# Patient Record
Sex: Female | Born: 1935 | Race: White | Hispanic: No | Marital: Single | Smoking: Never smoker
Health system: Southern US, Community
[De-identification: ages and names within clinical notes are randomized; demographics above are authoritative.]

## PROBLEM LIST (undated history)

## (undated) DIAGNOSIS — K219 Gastro-esophageal reflux disease without esophagitis: Secondary | ICD-10-CM

## (undated) DIAGNOSIS — I119 Hypertensive heart disease without heart failure: Secondary | ICD-10-CM

## (undated) DIAGNOSIS — E785 Hyperlipidemia, unspecified: Secondary | ICD-10-CM

## (undated) DIAGNOSIS — M1990 Unspecified osteoarthritis, unspecified site: Secondary | ICD-10-CM

## (undated) DIAGNOSIS — I255 Ischemic cardiomyopathy: Secondary | ICD-10-CM

## (undated) DIAGNOSIS — I10 Essential (primary) hypertension: Secondary | ICD-10-CM

## (undated) DIAGNOSIS — I5042 Chronic combined systolic (congestive) and diastolic (congestive) heart failure: Secondary | ICD-10-CM

## (undated) DIAGNOSIS — I2510 Atherosclerotic heart disease of native coronary artery without angina pectoris: Secondary | ICD-10-CM

## (undated) DIAGNOSIS — J449 Chronic obstructive pulmonary disease, unspecified: Secondary | ICD-10-CM

## (undated) DIAGNOSIS — I509 Heart failure, unspecified: Secondary | ICD-10-CM

## (undated) DIAGNOSIS — B019 Varicella without complication: Secondary | ICD-10-CM

## (undated) HISTORY — DX: Gastro-esophageal reflux disease without esophagitis: K21.9

## (undated) HISTORY — PX: SHOULDER SURGERY: SHX246

## (undated) HISTORY — DX: Hyperlipidemia, unspecified: E78.5

## (undated) HISTORY — DX: Varicella without complication: B01.9

## (undated) HISTORY — DX: Unspecified osteoarthritis, unspecified site: M19.90

---
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
## 1959-08-17 HISTORY — PX: APPENDECTOMY: SHX54

## 1964-08-16 HISTORY — PX: AMPUTATION FINGER: SHX6594

## 1983-08-17 HISTORY — PX: ABDOMINAL HYSTERECTOMY: SHX81

## 2008-08-16 HISTORY — PX: BACK SURGERY: SHX140

## 2010-08-16 HISTORY — PX: TOTAL HIP ARTHROPLASTY: SHX124

## 2015-10-21 DIAGNOSIS — I252 Old myocardial infarction: Secondary | ICD-10-CM | POA: Diagnosis not present

## 2015-10-21 DIAGNOSIS — M25552 Pain in left hip: Secondary | ICD-10-CM | POA: Diagnosis not present

## 2015-10-21 DIAGNOSIS — I110 Hypertensive heart disease with heart failure: Secondary | ICD-10-CM | POA: Diagnosis not present

## 2015-10-21 DIAGNOSIS — I5033 Acute on chronic diastolic (congestive) heart failure: Secondary | ICD-10-CM | POA: Diagnosis not present

## 2015-10-21 DIAGNOSIS — R079 Chest pain, unspecified: Secondary | ICD-10-CM | POA: Diagnosis not present

## 2015-10-21 DIAGNOSIS — R0609 Other forms of dyspnea: Secondary | ICD-10-CM | POA: Diagnosis not present

## 2015-10-21 DIAGNOSIS — R0989 Other specified symptoms and signs involving the circulatory and respiratory systems: Secondary | ICD-10-CM | POA: Diagnosis not present

## 2015-10-21 DIAGNOSIS — I509 Heart failure, unspecified: Secondary | ICD-10-CM | POA: Diagnosis not present

## 2015-10-21 DIAGNOSIS — D72829 Elevated white blood cell count, unspecified: Secondary | ICD-10-CM | POA: Diagnosis not present

## 2015-10-21 DIAGNOSIS — J441 Chronic obstructive pulmonary disease with (acute) exacerbation: Secondary | ICD-10-CM | POA: Diagnosis not present

## 2015-10-21 DIAGNOSIS — J9811 Atelectasis: Secondary | ICD-10-CM | POA: Diagnosis not present

## 2015-10-21 DIAGNOSIS — Y92230 Patient room in hospital as the place of occurrence of the external cause: Secondary | ICD-10-CM | POA: Diagnosis not present

## 2015-10-21 DIAGNOSIS — J90 Pleural effusion, not elsewhere classified: Secondary | ICD-10-CM | POA: Diagnosis not present

## 2015-10-21 DIAGNOSIS — M79606 Pain in leg, unspecified: Secondary | ICD-10-CM | POA: Diagnosis not present

## 2015-10-21 DIAGNOSIS — R748 Abnormal levels of other serum enzymes: Secondary | ICD-10-CM | POA: Diagnosis not present

## 2015-10-21 DIAGNOSIS — R0602 Shortness of breath: Secondary | ICD-10-CM | POA: Diagnosis not present

## 2015-10-21 DIAGNOSIS — J449 Chronic obstructive pulmonary disease, unspecified: Secondary | ICD-10-CM | POA: Diagnosis not present

## 2015-10-21 DIAGNOSIS — M25559 Pain in unspecified hip: Secondary | ICD-10-CM | POA: Diagnosis not present

## 2015-10-21 DIAGNOSIS — N179 Acute kidney failure, unspecified: Secondary | ICD-10-CM | POA: Diagnosis not present

## 2015-10-21 DIAGNOSIS — R197 Diarrhea, unspecified: Secondary | ICD-10-CM | POA: Diagnosis not present

## 2015-10-21 DIAGNOSIS — I081 Rheumatic disorders of both mitral and tricuspid valves: Secondary | ICD-10-CM | POA: Diagnosis not present

## 2015-10-21 DIAGNOSIS — E785 Hyperlipidemia, unspecified: Secondary | ICD-10-CM | POA: Diagnosis not present

## 2015-10-21 DIAGNOSIS — I519 Heart disease, unspecified: Secondary | ICD-10-CM | POA: Diagnosis not present

## 2015-10-21 DIAGNOSIS — I5189 Other ill-defined heart diseases: Secondary | ICD-10-CM | POA: Diagnosis not present

## 2015-10-21 DIAGNOSIS — E860 Dehydration: Secondary | ICD-10-CM | POA: Diagnosis not present

## 2015-10-21 DIAGNOSIS — I517 Cardiomegaly: Secondary | ICD-10-CM | POA: Diagnosis not present

## 2015-10-21 DIAGNOSIS — M25551 Pain in right hip: Secondary | ICD-10-CM | POA: Diagnosis not present

## 2015-10-21 DIAGNOSIS — R7989 Other specified abnormal findings of blood chemistry: Secondary | ICD-10-CM | POA: Diagnosis not present

## 2015-10-21 DIAGNOSIS — I10 Essential (primary) hypertension: Secondary | ICD-10-CM | POA: Diagnosis not present

## 2015-10-27 DIAGNOSIS — N179 Acute kidney failure, unspecified: Secondary | ICD-10-CM | POA: Diagnosis not present

## 2015-10-27 DIAGNOSIS — I5040 Unspecified combined systolic (congestive) and diastolic (congestive) heart failure: Secondary | ICD-10-CM | POA: Diagnosis not present

## 2015-10-27 DIAGNOSIS — I10 Essential (primary) hypertension: Secondary | ICD-10-CM | POA: Diagnosis not present

## 2015-10-27 DIAGNOSIS — E785 Hyperlipidemia, unspecified: Secondary | ICD-10-CM | POA: Diagnosis not present

## 2015-10-27 DIAGNOSIS — D72829 Elevated white blood cell count, unspecified: Secondary | ICD-10-CM | POA: Diagnosis not present

## 2015-10-29 ENCOUNTER — Encounter (HOSPITAL_COMMUNITY): Payer: Self-pay | Admitting: Emergency Medicine

## 2015-10-29 ENCOUNTER — Emergency Department (HOSPITAL_COMMUNITY): Payer: Medicare HMO

## 2015-10-29 ENCOUNTER — Inpatient Hospital Stay (HOSPITAL_COMMUNITY)
Admission: EM | Admit: 2015-10-29 | Discharge: 2015-11-02 | DRG: 280 | Disposition: A | Discharge status: Home Health | Payer: Medicare HMO | Attending: Cardiology | Admitting: Cardiology

## 2015-10-29 DIAGNOSIS — I5022 Chronic systolic (congestive) heart failure: Secondary | ICD-10-CM | POA: Diagnosis not present

## 2015-10-29 DIAGNOSIS — I214 Non-ST elevation (NSTEMI) myocardial infarction: Secondary | ICD-10-CM

## 2015-10-29 DIAGNOSIS — I130 Hypertensive heart and chronic kidney disease with heart failure and stage 1 through stage 4 chronic kidney disease, or unspecified chronic kidney disease: Secondary | ICD-10-CM | POA: Diagnosis present

## 2015-10-29 DIAGNOSIS — E785 Hyperlipidemia, unspecified: Secondary | ICD-10-CM | POA: Diagnosis present

## 2015-10-29 DIAGNOSIS — I2510 Atherosclerotic heart disease of native coronary artery without angina pectoris: Secondary | ICD-10-CM

## 2015-10-29 DIAGNOSIS — R7989 Other specified abnormal findings of blood chemistry: Secondary | ICD-10-CM | POA: Diagnosis not present

## 2015-10-29 DIAGNOSIS — I255 Ischemic cardiomyopathy: Secondary | ICD-10-CM | POA: Diagnosis present

## 2015-10-29 DIAGNOSIS — I5042 Chronic combined systolic (congestive) and diastolic (congestive) heart failure: Secondary | ICD-10-CM | POA: Diagnosis not present

## 2015-10-29 DIAGNOSIS — I10 Essential (primary) hypertension: Secondary | ICD-10-CM | POA: Diagnosis not present

## 2015-10-29 DIAGNOSIS — R112 Nausea with vomiting, unspecified: Secondary | ICD-10-CM | POA: Diagnosis not present

## 2015-10-29 DIAGNOSIS — I272 Other secondary pulmonary hypertension: Secondary | ICD-10-CM | POA: Diagnosis present

## 2015-10-29 DIAGNOSIS — R0789 Other chest pain: Secondary | ICD-10-CM | POA: Diagnosis not present

## 2015-10-29 DIAGNOSIS — I5021 Acute systolic (congestive) heart failure: Secondary | ICD-10-CM | POA: Diagnosis not present

## 2015-10-29 DIAGNOSIS — Z8249 Family history of ischemic heart disease and other diseases of the circulatory system: Secondary | ICD-10-CM

## 2015-10-29 DIAGNOSIS — I252 Old myocardial infarction: Secondary | ICD-10-CM

## 2015-10-29 DIAGNOSIS — I119 Hypertensive heart disease without heart failure: Secondary | ICD-10-CM

## 2015-10-29 DIAGNOSIS — R079 Chest pain, unspecified: Secondary | ICD-10-CM

## 2015-10-29 DIAGNOSIS — R778 Other specified abnormalities of plasma proteins: Secondary | ICD-10-CM

## 2015-10-29 DIAGNOSIS — N183 Chronic kidney disease, stage 3 (moderate): Secondary | ICD-10-CM | POA: Diagnosis present

## 2015-10-29 DIAGNOSIS — J449 Chronic obstructive pulmonary disease, unspecified: Secondary | ICD-10-CM | POA: Diagnosis present

## 2015-10-29 DIAGNOSIS — Z7982 Long term (current) use of aspirin: Secondary | ICD-10-CM

## 2015-10-29 DIAGNOSIS — I2721 Secondary pulmonary arterial hypertension: Secondary | ICD-10-CM

## 2015-10-29 DIAGNOSIS — I5043 Acute on chronic combined systolic (congestive) and diastolic (congestive) heart failure: Secondary | ICD-10-CM | POA: Diagnosis present

## 2015-10-29 HISTORY — DX: Hypertensive heart disease without heart failure: I11.9

## 2015-10-29 HISTORY — DX: Chronic obstructive pulmonary disease, unspecified: J44.9

## 2015-10-29 HISTORY — DX: Essential (primary) hypertension: I10

## 2015-10-29 HISTORY — DX: Ischemic cardiomyopathy: I25.5

## 2015-10-29 HISTORY — DX: Heart failure, unspecified: I50.9

## 2015-10-29 LAB — COMPREHENSIVE METABOLIC PANEL
ALT: 29 U/L (ref 14–54)
AST: 39 U/L (ref 15–41)
Albumin: 3.5 g/dL (ref 3.5–5.0)
Alkaline Phosphatase: 64 U/L (ref 38–126)
Anion gap: 13 (ref 5–15)
BUN: 19 mg/dL (ref 6–20)
CHLORIDE: 105 mmol/L (ref 101–111)
CO2: 26 mmol/L (ref 22–32)
Calcium: 9.4 mg/dL (ref 8.9–10.3)
Creatinine, Ser: 1.19 mg/dL — ABNORMAL HIGH (ref 0.44–1.00)
GFR, EST AFRICAN AMERICAN: 48 mL/min — AB (ref >60)
GFR, EST NON AFRICAN AMERICAN: 41 mL/min — AB (ref >60)
Glucose, Bld: 125 mg/dL — ABNORMAL HIGH (ref 65–99)
POTASSIUM: 4.6 mmol/L (ref 3.5–5.1)
SODIUM: 144 mmol/L (ref 135–145)
Total Bilirubin: 0.5 mg/dL (ref 0.3–1.2)
Total Protein: 6.3 g/dL — ABNORMAL LOW (ref 6.5–8.1)

## 2015-10-29 LAB — CBC
HCT: 40.6 % (ref 36.0–46.0)
Hemoglobin: 12.5 g/dL (ref 12.0–15.0)
MCH: 30.4 pg (ref 26.0–34.0)
MCHC: 30.8 g/dL (ref 30.0–36.0)
MCV: 98.8 fL (ref 78.0–100.0)
PLATELETS: 207 10*3/uL (ref 150–400)
RBC: 4.11 MIL/uL (ref 3.87–5.11)
RDW: 13.5 % (ref 11.5–15.5)
WBC: 12.6 10*3/uL — AB (ref 4.0–10.5)

## 2015-10-29 LAB — TROPONIN I: TROPONIN I: 0.29 ng/mL — AB (ref <0.031)

## 2015-10-29 MED ORDER — NITROGLYCERIN 0.4 MG SL SUBL
0.4000 mg | SUBLINGUAL_TABLET | SUBLINGUAL | Status: DC | PRN
  Administered 2015-10-29: 0.4 mg via SUBLINGUAL
  Filled 2015-10-29: qty 1

## 2015-10-29 MED ORDER — ASPIRIN 81 MG PO CHEW: 324.0000 mg | CHEWABLE_TABLET | Freq: Once | ORAL | Status: DC

## 2015-10-29 NOTE — ED Notes (Signed)
PER [facility]: Patient to ED from home c/o sudden onset central CP accompanied by SOB, N/V, and dizziness with movement progressing since 1630 today. HX MI, CHF, HTN. 20g. LAC, given 8mg  zofran en route and 2 NTG with relief from 9/10 to 5/10.  EMS VS: 122/80, HR 84 regular, RR 18, 98% RA. Patient A&O x 4, respirations e/u.

## 2015-10-30 ENCOUNTER — Encounter (HOSPITAL_COMMUNITY)
Admission: EM | Disposition: A | Discharge status: Home Health | Payer: Self-pay | Source: Home / Self Care | Attending: Cardiology

## 2015-10-30 DIAGNOSIS — N183 Chronic kidney disease, stage 3 (moderate): Secondary | ICD-10-CM | POA: Diagnosis not present

## 2015-10-30 DIAGNOSIS — I272 Other secondary pulmonary hypertension: Secondary | ICD-10-CM | POA: Diagnosis not present

## 2015-10-30 DIAGNOSIS — I5021 Acute systolic (congestive) heart failure: Secondary | ICD-10-CM

## 2015-10-30 DIAGNOSIS — I255 Ischemic cardiomyopathy: Secondary | ICD-10-CM | POA: Diagnosis not present

## 2015-10-30 DIAGNOSIS — N189 Chronic kidney disease, unspecified: Secondary | ICD-10-CM | POA: Insufficient documentation

## 2015-10-30 DIAGNOSIS — I2510 Atherosclerotic heart disease of native coronary artery without angina pectoris: Secondary | ICD-10-CM

## 2015-10-30 DIAGNOSIS — I10 Essential (primary) hypertension: Secondary | ICD-10-CM | POA: Diagnosis not present

## 2015-10-30 DIAGNOSIS — I5042 Chronic combined systolic (congestive) and diastolic (congestive) heart failure: Secondary | ICD-10-CM | POA: Diagnosis not present

## 2015-10-30 DIAGNOSIS — Z8249 Family history of ischemic heart disease and other diseases of the circulatory system: Secondary | ICD-10-CM | POA: Diagnosis not present

## 2015-10-30 DIAGNOSIS — J449 Chronic obstructive pulmonary disease, unspecified: Secondary | ICD-10-CM | POA: Insufficient documentation

## 2015-10-30 DIAGNOSIS — I5043 Acute on chronic combined systolic (congestive) and diastolic (congestive) heart failure: Secondary | ICD-10-CM | POA: Diagnosis not present

## 2015-10-30 DIAGNOSIS — I5022 Chronic systolic (congestive) heart failure: Secondary | ICD-10-CM | POA: Diagnosis not present

## 2015-10-30 DIAGNOSIS — E785 Hyperlipidemia, unspecified: Secondary | ICD-10-CM | POA: Diagnosis not present

## 2015-10-30 DIAGNOSIS — I252 Old myocardial infarction: Secondary | ICD-10-CM | POA: Diagnosis not present

## 2015-10-30 DIAGNOSIS — R079 Chest pain, unspecified: Secondary | ICD-10-CM | POA: Diagnosis not present

## 2015-10-30 DIAGNOSIS — I130 Hypertensive heart and chronic kidney disease with heart failure and stage 1 through stage 4 chronic kidney disease, or unspecified chronic kidney disease: Secondary | ICD-10-CM | POA: Diagnosis not present

## 2015-10-30 DIAGNOSIS — I214 Non-ST elevation (NSTEMI) myocardial infarction: Secondary | ICD-10-CM | POA: Diagnosis not present

## 2015-10-30 DIAGNOSIS — Z7982 Long term (current) use of aspirin: Secondary | ICD-10-CM | POA: Diagnosis not present

## 2015-10-30 HISTORY — PX: CARDIAC CATHETERIZATION: SHX172

## 2015-10-30 LAB — LIPID PANEL
CHOL/HDL RATIO: 4.7 ratio
CHOLESTEROL: 186 mg/dL (ref 0–200)
HDL: 40 mg/dL — AB (ref >40)
LDL CALC: 118 mg/dL — AB (ref 0–99)
TRIGLYCERIDES: 139 mg/dL (ref <150)
VLDL: 28 mg/dL (ref 0–40)

## 2015-10-30 LAB — POCT I-STAT 3, VENOUS BLOOD GAS (G3P V)
ACID-BASE EXCESS: 3 mmol/L — AB (ref 0.0–2.0)
Acid-Base Excess: 5 mmol/L — ABNORMAL HIGH (ref 0.0–2.0)
Bicarbonate: 29.3 mEq/L — ABNORMAL HIGH (ref 20.0–24.0)
Bicarbonate: 30.4 mEq/L — ABNORMAL HIGH (ref 20.0–24.0)
O2 SAT: 65 %
O2 Saturation: 99 %
PH VEN: 7.426 — AB (ref 7.250–7.300)
PO2 VEN: 35 mmHg (ref 31.0–45.0)
TCO2: 32 mmol/L (ref 0–100)
TCO2: 31 mmol/L (ref 0–100)
pCO2, Ven: 46.2 mmHg (ref 45.0–50.0)
pCO2, Ven: 49.8 mmHg (ref 45.0–50.0)
pH, Ven: 7.377 — ABNORMAL HIGH (ref 7.250–7.300)
pO2, Ven: 138 mmHg — ABNORMAL HIGH (ref 31.0–45.0)

## 2015-10-30 LAB — CBC WITH DIFFERENTIAL/PLATELET
BASOS ABS: 0 10*3/uL (ref 0.0–0.1)
Basophils Relative: 0 %
EOS ABS: 0.5 10*3/uL (ref 0.0–0.7)
EOS PCT: 4 %
HCT: 38.5 % (ref 36.0–46.0)
HEMOGLOBIN: 12.1 g/dL (ref 12.0–15.0)
LYMPHS ABS: 3.3 10*3/uL (ref 0.7–4.0)
LYMPHS PCT: 28 %
MCH: 30.9 pg (ref 26.0–34.0)
MCHC: 31.4 g/dL (ref 30.0–36.0)
MCV: 98.5 fL (ref 78.0–100.0)
Monocytes Absolute: 1.4 10*3/uL — ABNORMAL HIGH (ref 0.1–1.0)
Monocytes Relative: 11 %
NEUTROS PCT: 57 %
Neutro Abs: 6.9 10*3/uL (ref 1.7–7.7)
PLATELETS: 198 10*3/uL (ref 150–400)
RBC: 3.91 MIL/uL (ref 3.87–5.11)
RDW: 13.5 % (ref 11.5–15.5)
WBC: 12.1 10*3/uL — AB (ref 4.0–10.5)

## 2015-10-30 LAB — TROPONIN I
TROPONIN I: 0.5 ng/mL — AB (ref <0.031)
Troponin I: 2.52 ng/mL (ref <0.031)
Troponin I: 3.22 ng/mL (ref <0.031)

## 2015-10-30 LAB — COMPREHENSIVE METABOLIC PANEL
ALBUMIN: 3.5 g/dL (ref 3.5–5.0)
ALK PHOS: 60 U/L (ref 38–126)
ALT: 27 U/L (ref 14–54)
ANION GAP: 12 (ref 5–15)
AST: 37 U/L (ref 15–41)
BILIRUBIN TOTAL: 0.3 mg/dL (ref 0.3–1.2)
BUN: 17 mg/dL (ref 6–20)
CALCIUM: 9.5 mg/dL (ref 8.9–10.3)
CO2: 26 mmol/L (ref 22–32)
Chloride: 106 mmol/L (ref 101–111)
Creatinine, Ser: 1.13 mg/dL — ABNORMAL HIGH (ref 0.44–1.00)
GFR calc Af Amer: 51 mL/min — ABNORMAL LOW (ref >60)
GFR calc non Af Amer: 44 mL/min — ABNORMAL LOW (ref >60)
GLUCOSE: 113 mg/dL — AB (ref 65–99)
Potassium: 4.7 mmol/L (ref 3.5–5.1)
SODIUM: 144 mmol/L (ref 135–145)
TOTAL PROTEIN: 6.1 g/dL — AB (ref 6.5–8.1)

## 2015-10-30 LAB — PROTIME-INR
INR: 1.16 (ref 0.00–1.49)
INR: 1.12 (ref 0.00–1.49)
PROTHROMBIN TIME: 15 s (ref 11.6–15.2)
Prothrombin Time: 14.5 seconds (ref 11.6–15.2)

## 2015-10-30 LAB — POCT ACTIVATED CLOTTING TIME
ACTIVATED CLOTTING TIME: 157 s
Activated Clotting Time: 152 seconds
Activated Clotting Time: 199 seconds
Activated Clotting Time: 234 seconds

## 2015-10-30 LAB — TSH: TSH: 0.345 u[IU]/mL — ABNORMAL LOW (ref 0.350–4.500)

## 2015-10-30 LAB — MRSA PCR SCREENING: MRSA BY PCR: NEGATIVE

## 2015-10-30 LAB — BRAIN NATRIURETIC PEPTIDE: B NATRIURETIC PEPTIDE 5: 1649.7 pg/mL — AB (ref 0.0–100.0)

## 2015-10-30 LAB — APTT: aPTT: 68 seconds — ABNORMAL HIGH (ref 24–37)

## 2015-10-30 LAB — MAGNESIUM: Magnesium: 2.2 mg/dL (ref 1.7–2.4)

## 2015-10-30 SURGERY — RIGHT/LEFT HEART CATH AND CORONARY ANGIOGRAPHY
Anesthesia: LOCAL

## 2015-10-30 MED ORDER — TRAMADOL HCL 50 MG PO TABS: 50.0000 mg | ORAL_TABLET | Freq: Two times a day (BID) | ORAL | Status: DC | PRN

## 2015-10-30 MED ORDER — FUROSEMIDE 10 MG/ML IJ SOLN
20.0000 mg | Freq: Two times a day (BID) | INTRAMUSCULAR | Status: DC
  Administered 2015-10-30: 20 mg via INTRAVENOUS
  Filled 2015-10-30: qty 2

## 2015-10-30 MED ORDER — IOHEXOL 350 MG/ML SOLN
INTRAVENOUS | Status: DC | PRN
  Administered 2015-10-30: 80 mL via INTRACARDIAC

## 2015-10-30 MED ORDER — ONDANSETRON HCL 4 MG/2ML IJ SOLN: 4.0000 mg | Freq: Four times a day (QID) | INTRAMUSCULAR | Status: DC | PRN

## 2015-10-30 MED ORDER — LIDOCAINE HCL (PF) 1 % IJ SOLN
INTRAMUSCULAR | Status: DC | PRN
  Administered 2015-10-30 (×2): 3 mL via SUBCUTANEOUS

## 2015-10-30 MED ORDER — ATORVASTATIN CALCIUM 80 MG PO TABS
80.0000 mg | ORAL_TABLET | Freq: Every day | ORAL | Status: DC
  Administered 2015-10-30 – 2015-11-01 (×3): 80 mg via ORAL
  Filled 2015-10-30 (×3): qty 1

## 2015-10-30 MED ORDER — ADENOSINE 12 MG/4ML IV SOLN
12.0000 mL | Freq: Once | INTRAVENOUS | Status: DC
  Filled 2015-10-30: qty 12

## 2015-10-30 MED ORDER — HEPARIN SODIUM (PORCINE) 1000 UNIT/ML IJ SOLN
INTRAMUSCULAR | Status: DC | PRN
  Administered 2015-10-30 (×2): 3000 [IU] via INTRAVENOUS

## 2015-10-30 MED ORDER — SODIUM CHLORIDE 0.9% FLUSH: 3.0000 mL | INTRAVENOUS | Status: DC | PRN

## 2015-10-30 MED ORDER — METOPROLOL TARTRATE 25 MG PO TABS
25.0000 mg | ORAL_TABLET | Freq: Two times a day (BID) | ORAL | Status: DC
  Administered 2015-10-30: 25 mg via ORAL
  Filled 2015-10-30: qty 1

## 2015-10-30 MED ORDER — NITROGLYCERIN IN D5W 200-5 MCG/ML-% IV SOLN
3.0000 ug/min | INTRAVENOUS | Status: DC
Start: 2015-10-30 — End: 2015-10-30
  Administered 2015-10-30: 3 ug/min via INTRAVENOUS
  Filled 2015-10-30: qty 250

## 2015-10-30 MED ORDER — VERAPAMIL HCL 2.5 MG/ML IV SOLN
INTRAVENOUS | Status: DC | PRN
  Administered 2015-10-30: 12:00:00 via INTRA_ARTERIAL

## 2015-10-30 MED ORDER — NITROGLYCERIN 1 MG/10 ML FOR IR/CATH LAB
INTRA_ARTERIAL | Status: AC
  Filled 2015-10-30: qty 10

## 2015-10-30 MED ORDER — NITROGLYCERIN IN D5W 200-5 MCG/ML-% IV SOLN: 0.0000 ug/min | INTRAVENOUS | Status: DC

## 2015-10-30 MED ORDER — SODIUM CHLORIDE 0.9 % IV SOLN
INTRAVENOUS | Status: DC
  Administered 2015-10-30: 08:00:00 via INTRAVENOUS

## 2015-10-30 MED ORDER — MIDAZOLAM HCL 2 MG/2ML IJ SOLN
INTRAMUSCULAR | Status: DC | PRN
  Administered 2015-10-30: 1 mg via INTRAVENOUS

## 2015-10-30 MED ORDER — SODIUM CHLORIDE 0.9 % IV SOLN
INTRAVENOUS | Status: DC
  Administered 2015-10-30: 03:00:00 via INTRAVENOUS

## 2015-10-30 MED ORDER — PANTOPRAZOLE SODIUM 40 MG PO TBEC
40.0000 mg | DELAYED_RELEASE_TABLET | Freq: Every day | ORAL | Status: DC
  Administered 2015-10-31 – 2015-11-02 (×3): 40 mg via ORAL
  Filled 2015-10-30 (×3): qty 1

## 2015-10-30 MED ORDER — ACETAMINOPHEN 325 MG PO TABS
650.0000 mg | ORAL_TABLET | ORAL | Status: DC | PRN
  Administered 2015-10-30 – 2015-11-01 (×3): 650 mg via ORAL
  Filled 2015-10-30 (×3): qty 2

## 2015-10-30 MED ORDER — HEPARIN (PORCINE) IN NACL 2-0.9 UNIT/ML-% IJ SOLN: INTRAMUSCULAR | Status: DC | PRN

## 2015-10-30 MED ORDER — HEPARIN (PORCINE) IN NACL 2-0.9 UNIT/ML-% IJ SOLN
INTRAMUSCULAR | Status: AC
  Filled 2015-10-30: qty 1000

## 2015-10-30 MED ORDER — ASPIRIN EC 81 MG PO TBEC
81.0000 mg | DELAYED_RELEASE_TABLET | Freq: Every day | ORAL | Status: DC
  Administered 2015-10-30 – 2015-11-02 (×4): 81 mg via ORAL
  Filled 2015-10-30 (×4): qty 1

## 2015-10-30 MED ORDER — LIDOCAINE HCL (PF) 1 % IJ SOLN
INTRAMUSCULAR | Status: AC
  Filled 2015-10-30: qty 30

## 2015-10-30 MED ORDER — SODIUM CHLORIDE 0.9% FLUSH
3.0000 mL | Freq: Two times a day (BID) | INTRAVENOUS | Status: DC
  Administered 2015-10-30: 3 mL via INTRAVENOUS

## 2015-10-30 MED ORDER — GABAPENTIN 100 MG PO CAPS
200.0000 mg | ORAL_CAPSULE | Freq: Three times a day (TID) | ORAL | Status: DC
  Administered 2015-10-30 – 2015-11-02 (×9): 200 mg via ORAL
  Filled 2015-10-30 (×10): qty 2

## 2015-10-30 MED ORDER — MIDAZOLAM HCL 2 MG/2ML IJ SOLN
INTRAMUSCULAR | Status: AC
  Filled 2015-10-30: qty 2

## 2015-10-30 MED ORDER — CARVEDILOL 6.25 MG PO TABS
6.2500 mg | ORAL_TABLET | Freq: Two times a day (BID) | ORAL | Status: DC
  Administered 2015-10-30 – 2015-11-02 (×6): 6.25 mg via ORAL
  Filled 2015-10-30 (×6): qty 1

## 2015-10-30 MED ORDER — HEPARIN SODIUM (PORCINE) 5000 UNIT/ML IJ SOLN
5000.0000 [IU] | Freq: Three times a day (TID) | INTRAMUSCULAR | Status: DC
  Administered 2015-10-30 – 2015-11-02 (×9): 5000 [IU] via SUBCUTANEOUS
  Filled 2015-10-30 (×8): qty 1

## 2015-10-30 MED ORDER — SODIUM CHLORIDE 0.9% FLUSH
3.0000 mL | Freq: Two times a day (BID) | INTRAVENOUS | Status: DC
  Administered 2015-10-30 – 2015-11-02 (×6): 3 mL via INTRAVENOUS

## 2015-10-30 MED ORDER — SODIUM CHLORIDE 0.9 % IV SOLN: 250.0000 mL | INTRAVENOUS | Status: DC | PRN

## 2015-10-30 MED ORDER — ASPIRIN 81 MG PO CHEW: 81.0000 mg | CHEWABLE_TABLET | ORAL | Status: DC

## 2015-10-30 MED ORDER — ASPIRIN EC 81 MG PO TBEC: 81.0000 mg | DELAYED_RELEASE_TABLET | Freq: Every day | ORAL | Status: DC

## 2015-10-30 MED ORDER — VERAPAMIL HCL 2.5 MG/ML IV SOLN
INTRAVENOUS | Status: AC
  Filled 2015-10-30: qty 2

## 2015-10-30 MED ORDER — ADENOSINE (DIAGNOSTIC) 140MCG/KG/MIN
INTRAVENOUS | Status: DC | PRN
  Administered 2015-10-30: 140 ug/kg/min via INTRAVENOUS

## 2015-10-30 MED ORDER — HEPARIN (PORCINE) IN NACL 100-0.45 UNIT/ML-% IJ SOLN
800.0000 [IU]/h | INTRAMUSCULAR | Status: DC
  Administered 2015-10-30: 800 [IU]/h via INTRAVENOUS
  Filled 2015-10-30: qty 250

## 2015-10-30 MED ORDER — HEPARIN BOLUS VIA INFUSION
2000.0000 [IU] | Freq: Once | INTRAVENOUS | Status: AC
  Administered 2015-10-30: 2000 [IU] via INTRAVENOUS
  Filled 2015-10-30: qty 2000

## 2015-10-30 MED ORDER — CLONIDINE HCL 0.1 MG PO TABS
0.2000 mg | ORAL_TABLET | Freq: Two times a day (BID) | ORAL | Status: DC
  Administered 2015-10-30: 0.2 mg via ORAL
  Filled 2015-10-30: qty 1

## 2015-10-30 MED ORDER — CLONIDINE HCL 0.1 MG PO TABS
0.1000 mg | ORAL_TABLET | Freq: Two times a day (BID) | ORAL | Status: DC
Start: 2015-10-30 — End: 2015-10-31
  Administered 2015-10-30: 0.1 mg via ORAL
  Filled 2015-10-30: qty 1

## 2015-10-30 MED ORDER — SERTRALINE HCL 100 MG PO TABS
100.0000 mg | ORAL_TABLET | Freq: Every day | ORAL | Status: DC
  Administered 2015-10-30 – 2015-11-01 (×4): 100 mg via ORAL
  Filled 2015-10-30: qty 1
  Filled 2015-10-30: qty 2
  Filled 2015-10-30 (×2): qty 1

## 2015-10-30 MED ORDER — HEPARIN (PORCINE) IN NACL 2-0.9 UNIT/ML-% IJ SOLN
INTRAMUSCULAR | Status: DC | PRN
  Administered 2015-10-30: 13:00:00

## 2015-10-30 SURGICAL SUPPLY — 17 items
CATH BALLN WEDGE 5F 110CM (CATHETERS) ×2 IMPLANT
CATH HEARTRAIL 6F IL3.5 (CATHETERS) ×2 IMPLANT
CATH INFINITI 5FR ANG PIGTAIL (CATHETERS) ×2 IMPLANT
CATH MICROCATH NAVVUS (MICROCATHETER) ×1 IMPLANT
CATH OPTITORQUE SARAH 4.5 5F (CATHETERS) ×2 IMPLANT
GLIDESHEATH SLEND SS 6F .021 (SHEATH) ×4 IMPLANT
KIT ENCORE 26 ADVANTAGE (KITS) ×2 IMPLANT
KIT HEART LEFT (KITS) ×2 IMPLANT
MICROCATHETER NAVVUS (MICROCATHETER) ×2
PACK CARDIAC CATHETERIZATION (CUSTOM PROCEDURE TRAY) ×2 IMPLANT
SHEATH FAST CATH BRACH 5F 5CM (SHEATH) IMPLANT
SYR MEDRAD MARK V 150ML (SYRINGE) ×2 IMPLANT
TRANSDUCER W/STOPCOCK (MISCELLANEOUS) ×2 IMPLANT
TUBING CIL FLEX 10 FLL-RA (TUBING) ×2 IMPLANT
WIRE HI TORQ VERSACORE-J 145CM (WIRE) ×2 IMPLANT
WIRE RUNTHROUGH .014X180CM (WIRE) ×2 IMPLANT
WIRE SAFE-T 1.5MM-J .035X260CM (WIRE) ×2 IMPLANT

## 2015-10-30 NOTE — Progress Notes (Signed)
     Pt is for cath today  Had a recent myoview in [place] [facility] that showed a large ant. Scar and reduced LV function  I have discussed risks, benefits, options .  She understands and agrees to proceed.     Nahser, Wonda Cheng, MD  10/30/2015 10:24 AM    [address] [place],  [place], [place]  [address] Pager [phone] Phone: [phone]; Fax: [phone]   [facility]  [address] [place], [place]  [address] [phone]   Fax [phone]

## 2015-10-30 NOTE — Progress Notes (Signed)
TR band removed ,gauze 2x2 and tegaderm applied, no bleeding nor hematoma noted continue to monitor. affected arm elevated  with pillow.

## 2015-10-30 NOTE — Interval H&P Note (Signed)
History and Physical Interval Note:  10/30/2015 12:14 PM  Katherine [name]  has presented today for surgery, with the diagnosis of unstable angina  The various methods of treatment have been discussed with the patient and family. After consideration of risks, benefits and other options for treatment, the patient has consented to  Procedure(s): Right/Left Heart Cath and Coronary Angiography (N/A) as a surgical intervention .  The patient's history has been reviewed, patient examined, no change in status, stable for surgery.  I have reviewed the patient's chart and labs.  Questions were answered to the patient's satisfaction.     Kathlyn [place]

## 2015-10-30 NOTE — ED Notes (Signed)
Pt being transported to Cath Lab.

## 2015-10-30 NOTE — ED Provider Notes (Addendum)
CSN: [id number]     Arrival date & time 10/29/15  2137 History   First MD Initiated Contact with Patient 10/29/15 2204     Chief Complaint  Patient presents with  . Chest Pain     (Consider location/radiation/quality/duration/timing/severity/associated sxs/prior Treatment) Patient is a 82 y.o. female presenting with chest pain. The history is provided by the patient.  Chest Pain Pain location:  L chest Pain quality: pressure   Pain radiates to:  L jaw Pain radiates to the back: no   Pain severity:  Moderate Onset quality:  Sudden Timing:  Intermittent Progression:  Waxing and waning Chronicity:  Recurrent Context: not breathing and not lifting   Relieved by:  None tried Worsened by:  Nothing tried Ineffective treatments:  None tried Associated symptoms: diaphoresis, dizziness, nausea, shortness of breath and vomiting   Associated symptoms: no back pain and no cough     Past Medical History  Diagnosis Date  . CHF (congestive heart failure) ([place])   . Hypertension   . MI, old     never sought treatment when it occurred, shows up old on EKG   Past Surgical History  Procedure Laterality Date  . Shoulder surgery     History reviewed. No pertinent family history. Social History  Substance Use Topics  . Smoking status: Never Smoker   . Smokeless tobacco: None  . Alcohol Use: No   OB History    No data available     Review of Systems  Constitutional: Positive for diaphoresis.  Respiratory: Positive for shortness of breath. Negative for cough.   Cardiovascular: Positive for chest pain.  Gastrointestinal: Positive for nausea and vomiting.  Genitourinary: Negative for dysuria and dyspareunia.  Musculoskeletal: Negative for back pain and joint swelling.  Skin: Negative for rash and wound.  Neurological: Positive for dizziness.  All other systems reviewed and are negative.     Allergies  Review of patient's allergies indicates no known allergies.  Home  Medications   Prior to Admission medications   Medication Sig Start Date End Date Taking? Authorizing Provider  aspirin EC 81 MG tablet Take 81 mg by mouth daily.   Yes Historical Provider, MD  celecoxib (CELEBREX) 200 MG capsule Take 200 mg by mouth daily.   Yes Historical Provider, MD  cloNIDine (CATAPRES) 0.2 MG tablet Take 0.2 mg by mouth 2 (two) times daily. Can take 1 addt'l tab as needed for BP   Yes Historical Provider, MD  furosemide (LASIX) 40 MG tablet Take 40 mg by mouth 2 (two) times daily.   Yes Historical Provider, MD  gabapentin (NEURONTIN) 100 MG capsule Take 200 mg by mouth 3 (three) times daily.   Yes Historical Provider, MD  lansoprazole (PREVACID) 30 MG capsule Take 30 mg by mouth daily at 12 noon.   Yes Historical Provider, MD  Omega-3 Fatty Acids (FISH OIL) 1000 MG CAPS Take 1,000 mg by mouth daily.   Yes Historical Provider, MD  sertraline (ZOLOFT) 100 MG tablet Take 100 mg by mouth at bedtime.    Yes Historical Provider, MD  traMADol (ULTRAM) 50 MG tablet Take 50 mg by mouth every 12 (twelve) hours as needed for moderate pain.    Yes Historical Provider, MD   BP 178/94 mmHg  Pulse 81  Temp(Src) 98 F (36.7 C) (Oral)  Resp 20  Ht 5\' 1"  (1.549 m)  Wt 142 lb (64.411 kg)  BMI 26.84 kg/m2  SpO2 97% Physical Exam  Constitutional: She is oriented to person, place,  and time. She appears well-developed and well-nourished.  HENT:  Head: Normocephalic and atraumatic.  Neck: Normal range of motion.  Cardiovascular: Normal rate and regular rhythm.   Pulmonary/Chest: Effort normal. No stridor. No respiratory distress. She has no wheezes.  Abdominal: Soft. She exhibits no distension. There is no tenderness. There is no rebound and no guarding.  Neurological: She is alert and oriented to person, place, and time. No cranial nerve deficit. Coordination normal.  Skin: Skin is warm and dry.  Nursing note and vitals reviewed.   ED Course  Procedures (including critical care  time)  CRITICAL CARE Performed by: Merrily Pew   Total critical care time: 35 minutes  Critical care time was exclusive of separately billable procedures and treating other patients.  Critical care was necessary to treat or prevent imminent or life-threatening deterioration.  Critical care was time spent personally by me on the following activities: development of treatment plan with patient and/or surrogate as well as nursing, discussions with consultants, evaluation of patient's response to treatment, examination of patient, obtaining history from patient or surrogate, ordering and performing treatments and interventions, ordering and review of laboratory studies, ordering and review of radiographic studies, pulse oximetry and re-evaluation of patient's condition.   Labs Review Labs Reviewed  CBC - Abnormal; Notable for the following:    WBC 12.6 (*)    All other components within normal limits  COMPREHENSIVE METABOLIC PANEL - Abnormal; Notable for the following:    Glucose, Bld 125 (*)    Creatinine, Ser 1.19 (*)    Total Protein 6.3 (*)    GFR calc non Af Amer 41 (*)    GFR calc Af Amer 48 (*)    All other components within normal limits  TROPONIN I - Abnormal; Notable for the following:    Troponin I 0.29 (*)    All other components within normal limits    Imaging Review Dg Chest 2 View  10/29/2015  CLINICAL DATA:  Mid chest pain, vomiting since 4 p.m. EXAM: CHEST  2 VIEW COMPARISON:  None. FINDINGS: Cardiomegaly, mild to moderate in degree. Atherosclerotic changes noted at the aortic arch. Lungs are clear. Lung volumes are normal. No pleural effusion or pneumothorax seen. Osseous and soft tissue structures about the chest are unremarkable. IMPRESSION: Cardiomegaly, mild to moderate in degree.  No evidence of CHF. Lungs are clear.  No evidence of pneumonia. Electronically Signed   By: Franki Cabot M.D.   On: 10/29/2015 22:50   I have personally reviewed and evaluated these  images and lab results as part of my medical decision-making.   EKG Interpretation   Date/Time:  Wednesday October 29 2015 22:11:51 EDT Ventricular Rate:  80 PR Interval:  160 QRS Duration: 100 QT Interval:  405 QTC Calculation: 467 R Axis:   86 Text Interpretation:  Sinus rhythm Probable anteroseptal infarct, recent  Confirmed by [facility] MD, Corene Cornea [phone]) on 10/29/2015 10:58:27 PM      MDM   Final diagnoses:  Chest pain, unspecified chest pain type  Elevated troponin   Typical sounding cp, slight ST elevation in anterior leads. Positive troponin. Repeat ecg without changes.  Reviewed records from [place] and recent hospitalization and seems they did a stress test which showed scarring and mild ischemia but no cath done.  D/W cardiology who will admit patient for NSTEMI.    Merrily Pew, MD 10/30/15 1640

## 2015-10-30 NOTE — Progress Notes (Signed)
ANTICOAGULATION CONSULT NOTE - Initial Consult  Pharmacy Consult for heparin Indication: NSTEMI  No Known Allergies  Patient Measurements: Height: 5\' 1"  (154.9 cm) Weight: 142 lb (64.411 kg) IBW/kg (Calculated) : 47.8 Heparin Dosing Weight: 60kg  Vital Signs: Temp: 98 F (36.7 C) (03/15 2155) Temp Source: Oral (03/15 2155) BP: 178/82 mmHg (03/16 0100) Pulse Rate: 81 (03/16 0115)  Labs:  Recent Labs  10/29/15 2213  HGB 12.5  HCT 40.6  PLT 207  CREATININE 1.19*  TROPONINI 0.29*    Estimated Creatinine Clearance: 30.8 mL/min (by C-G formula based on Cr of 1.19).   Medical History: Past Medical History  Diagnosis Date  . CHF (congestive heart failure) ([place])   . Hypertension   . MI, old     never sought treatment when it occurred, shows up old on EKG  . COPD (chronic obstructive pulmonary disease) ([place])     worked in a [place]  . Chronic kidney disease      Assessment: 82yo female c/o sudden onset of central CP associated w/ SOB, N/V, and dizziness, troponin found to be elevated, to begin heparin for NSTEMI.  Goal of Therapy:  Heparin level 0.3-0.7 units/ml Monitor platelets by anticoagulation protocol: Yes   Plan:  Will give heparin 2000 units IV bolus x1 followed by gtt at 800 units/hr and monitor heparin levels and CBC.  Wynona Neat, PharmD, BCPS  10/30/2015,2:11 AM

## 2015-10-30 NOTE — ED Notes (Signed)
Paged Dr. Claiborne Billings to Martie Round, [place].

## 2015-10-30 NOTE — ED Notes (Signed)
Lab informed this RN of patient's critical troponin of 0.50. Admitting MD paged to notify.

## 2015-10-30 NOTE — ED Notes (Signed)
Paged cardiology MD regarding pt. Increased elevated troponin. Pt. Ambulatory to bathroom with no complaint of CP. RN at bedside.

## 2015-10-30 NOTE — ED Notes (Signed)
Cardiology MD at bedside.

## 2015-10-30 NOTE — Progress Notes (Signed)
Sister and brother in to visit. Waiting on TCU bed assignment.

## 2015-10-30 NOTE — ED Notes (Signed)
Patient moved onto hospital bed for comfort.

## 2015-10-30 NOTE — H&P (Signed)
Admit date: 10/29/2015 Referring Physician: Dr. 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  The patient states that Tuesday a week ago she presented to [facility] ER with SOB and chest pain.  She had an elevated troponin at 0.182 and acute kidney injury with creatinine 1.56.  She was diagnosed with acute combined systolic/diastolic CHF.  She had a nuclear stress test that showed anterior wall scar with minimal ischemia with severe LV dysfunction.  Echo showed EF 50-55% with moderate apical wall HK, moderate MR, moderate TR, moderate pulmonary HTN with PASP 50-66mmHg.  Chest CT showed no PE and edema.  She was started on BB/ARB/Lasix/Hydralazine.  She was discharged home.  Today around 4pm she developed chest pain that she describes as pain in the center of her chest with radiation into her neck.  She got nauseated and vomited several times.  She was very SOB as well.  She took SL NTG x 3 and 1 in the ER and had improvement in her pain.  Currently she complains of 4/10 pain.  She says that she was also very diaphoretic.  She denies any LE edema over the past few days.    PMH:    Past Medical History  Diagnosis Date  . CHF (congestive heart failure) ([place])   . Hypertension   . MI, old     never sought treatment when it occurred, shows up old on EKG  . COPD (chronic obstructive pulmonary disease) ([place])     worked in a [place]  . Chronic kidney disease     PSH:    Past Surgical History  Procedure Laterality Date  . Shoulder surgery      ALLERGIES:   Review of patient's allergies indicates no known allergies.  Prior to Admit Meds:   (Not in a hospital admission) Family HX:    Family History  Problem Relation Age of Onset  . Cancer Mother   . CAD Sister     . CAD Brother    Social HX:    Social History   Social History  . Marital Status: Single    Spouse Name: N/A  . Number of Children: N/A  . Years of Education: N/A   Occupational History  . Not on file.   Social History Main Topics  . Smoking status: Never Smoker   . Smokeless tobacco: Not on file  . Alcohol Use: No  . Drug Use: Not on file  . Sexual Activity: No   Other Topics Concern  . Not on file   Social History Narrative  . No narrative on file     ROS:  All 11 ROS were addressed and are negative except what is stated in the HPI  PHYSICAL EXAM Filed Vitals:   10/29/15 2335 10/29/15 2336  BP: 178/94   Pulse: 79 81  Temp:    Resp:  20   General: Well developed, well nourished, in no acute distress Head: Eyes PERRLA, No xanthomas.   Normal cephalic and atramatic  Lungs:   Clear bilaterally to auscultation and percussion. Heart:   HRRR S1 S2 Pulses are 2+ & equal.            No carotid bruit. No JVD.  No abdominal bruits. No femoral bruits. Abdomen: Bowel  sounds are positive, abdomen soft and non-tender without masses  Extremities:   No clubbing, cyanosis or edema.  DP +1 Neuro: Alert and oriented X 3. Psych:  Good affect, responds appropriately   Labs:   Lab Results  Component Value Date   WBC 12.6* 10/29/2015   HGB 12.5 10/29/2015   HCT 40.6 10/29/2015   MCV 98.8 10/29/2015   PLT 207 10/29/2015     Recent Labs Lab 10/29/15 2213  NA 144  K 4.6  CL 105  CO2 26  BUN 19  CREATININE 1.19*  CALCIUM 9.4  PROT 6.3*  BILITOT 0.5  ALKPHOS 64  ALT 29  AST 39  GLUCOSE 125*   Lab Results  Component Value Date   TROPONINI 0.29* 10/29/2015   No results found for: PTT No results found for: INR, PROTIME  No results found for: CHOL No results found for: HDL No results found for: LDLCALC No results found for: TRIG No results found for: CHOLHDL No results found for: LDLDIRECT    Radiology:  Dg Chest 2 View  10/29/2015  CLINICAL DATA:  Mid  chest pain, vomiting since 4 p.m. EXAM: CHEST  2 VIEW COMPARISON:  None. FINDINGS: Cardiomegaly, mild to moderate in degree. Atherosclerotic changes noted at the aortic arch. Lungs are clear. Lung volumes are normal. No pleural effusion or pneumothorax seen. Osseous and soft tissue structures about the chest are unremarkable. IMPRESSION: Cardiomegaly, mild to moderate in degree.  No evidence of CHF. Lungs are clear.  No evidence of pneumonia. Electronically Signed   By: Franki Cabot M.D.   On: 10/29/2015 22:50    EKG:  NSR with anteroseptal infarct  ASSESSMENT/PLAN:   1.  NSTEMI with pain starting around 4pm today and improved with SL NTG but continues to have 5/10 CP.  EKG shows no acute ST changes.  Recently admitted to [facility] with acute combined systolic/diastolic CHF and mildly elevated troponin.  Nuclear stress test with anterior wall scar and minimal ischemia and severe LV dysfunction.  Echo, though, showed low normal LVF with moderate apical HK.  Troponin today elevated at 0.29.  Chest xray with no edema.  Will admit to stepdown bed.  Cycle cardiac enzymes.  Start IV heparin gtt and IV NTG gtt.  Continue  ASA.  Start BB/high dose statin.  NPO for cath in am.  Cardiac catheterization was discussed with the patient fully. The patient understands that risks include but are not limited to stroke (1 in 1000), death (1 in 70), kidney failure [usually temporary] (1 in 500), bleeding (1 in 200), allergic reaction [possibly serious] (1 in 200).  The patient understands and is willing to proceed.   2.  Moderate pulmonary HTN with moderate TR on echo with persistent SOB and CHF.  Will plan right heart cath at time of coronary angiography.  She does have a history of COPD. 3.  COPD - she is a nonsmoker and was told this was from working in a [place]. 4.  HTN with elevated BP.  Starting BB and IV NTG gtt.  Will titrate for HTN.  Continue Catapress.  5.  Chronic combined systolic/diastolic CHF -  there appears to be some discrepancy between the EF on nuclear stress test and echo.  Will repeat echo in am.  No edema on chest xray and lungs are clear with no LE edema.  Hold Lasix for cath in am.   Katherine Margarita, MD  10/30/2015  12:02 AM

## 2015-10-30 NOTE — Progress Notes (Signed)
Site area: rt ac venous sheath clotted; unable to aspirate Site Prior to Removal:  Level 0 Pressure Applied For:  15 minutes Manual:   yes Patient Status During Pull:  stable Post Pull Site:  Level  0 Post Pull Instructions Given:  yes Post Pull Pulses Present: yes Dressing Applied:  Small tegaderm Bedrest begins @  1420 Comments:

## 2015-10-30 NOTE — Progress Notes (Signed)
Eating turkey sandwich 

## 2015-10-31 ENCOUNTER — Inpatient Hospital Stay (HOSPITAL_COMMUNITY): Payer: Medicare HMO

## 2015-10-31 ENCOUNTER — Encounter (HOSPITAL_COMMUNITY): Payer: Self-pay | Admitting: Cardiovascular Disease

## 2015-10-31 DIAGNOSIS — E785 Hyperlipidemia, unspecified: Secondary | ICD-10-CM

## 2015-10-31 DIAGNOSIS — R079 Chest pain, unspecified: Secondary | ICD-10-CM

## 2015-10-31 DIAGNOSIS — I5022 Chronic systolic (congestive) heart failure: Secondary | ICD-10-CM

## 2015-10-31 LAB — CBC
HEMATOCRIT: 36.6 % (ref 36.0–46.0)
Hemoglobin: 11.4 g/dL — ABNORMAL LOW (ref 12.0–15.0)
MCH: 30.1 pg (ref 26.0–34.0)
MCHC: 31.1 g/dL (ref 30.0–36.0)
MCV: 96.6 fL (ref 78.0–100.0)
Platelets: 204 10*3/uL (ref 150–400)
RBC: 3.79 MIL/uL — ABNORMAL LOW (ref 3.87–5.11)
RDW: 13.4 % (ref 11.5–15.5)
WBC: 9.5 10*3/uL (ref 4.0–10.5)

## 2015-10-31 LAB — ECHOCARDIOGRAM COMPLETE
Height: 61 in
Weight: 2211.65 oz

## 2015-10-31 LAB — HEMOGLOBIN A1C
Hgb A1c MFr Bld: 6 % — ABNORMAL HIGH (ref 4.8–5.6)
Mean Plasma Glucose: 126 mg/dL

## 2015-10-31 MED ORDER — PERFLUTREN LIPID MICROSPHERE
INTRAVENOUS | Status: AC
Start: 2015-10-31 — End: 2015-10-31
  Administered 2015-10-31: 2 mL
  Filled 2015-10-31: qty 10

## 2015-10-31 MED ORDER — PERFLUTREN LIPID MICROSPHERE
1.0000 mL | INTRAVENOUS | Status: AC | PRN
  Filled 2015-10-31: qty 10

## 2015-10-31 MED ORDER — FUROSEMIDE 20 MG PO TABS
20.0000 mg | ORAL_TABLET | Freq: Every day | ORAL | Status: DC
  Administered 2015-10-31 – 2015-11-02 (×3): 20 mg via ORAL
  Filled 2015-10-31 (×3): qty 1

## 2015-10-31 NOTE — Clinical Documentation Improvement (Signed)
Cardiology  Can the diagnosis of CKD be further specified?   CKD Stage I - GFR greater than or equal to 90  CKD Stage II - GFR 60-89  CKD Stage III - GFR 30-59  CKD Stage IV - GFR 15-29  CKD Stage V - GFR < 15  ESRD (End Stage Renal Disease)  Other condition  Unable to clinically determine   Supporting Information: : (risk factors, signs and symptoms, diagnostics, treatment) Patient with a history of chronic kidney disease per 3/16 progress notes. Labs:   Bun    Creat     GFR: 3/16:     17       1.13       44 3/15:     19       1.19       41  Please exercise your independent, professional judgment when responding. A specific answer is not anticipated or expected.   Thank You, [place] [phone]

## 2015-10-31 NOTE — Progress Notes (Signed)
Pt received to unit. Pt oriented to room and equipment. Pt denies pain at this time. Call light within reach. Will continue to monitor.   Fritz Pickerel, RN

## 2015-10-31 NOTE — Progress Notes (Signed)
CARDIAC REHAB PHASE I   PRE:  Rate/Rhythm: 66 SR  BP:  Supine: 105/50  Sitting:   Standing:    SaO2: 95%RA  MODE:  Ambulation: 170 ft   POST:  Rate/Rhythm: 83 SR  BP:  Supine:   Sitting: 114/80  Standing:    SaO2: 98%RA 1055-1155 Pt walked 170 ft on RA with gait belt use, rolling walker and asst x 1. Pt stated she uses cane at home. Stated one leg is shorter than the other. Assisted to recliner with call bell. No CP. Some DOE but sats good. Gave pt CHF booklet and reviewed zones and stressed the importance of daily weights, low sodium diet of 2000 mg. Gave low sodium and heart healthy handouts. Pt has scales at home. Reviewed MI restrictions and encouraged pt to take it easy as she is visiting her sister so that she can take care of her sister. Reviewed NTG use. Discussed CRP 2 and will refer to [facility] since pt is going to stay with her sister for a while. Pt stated she did not think she could do program as she cannot use stationary bike or stepper due to shorter leg.  Will see tomorrow and walk again and give ex ed then.   Graylon Good, RN BSN  10/31/2015 11:50 AM

## 2015-10-31 NOTE — Progress Notes (Signed)
Transferred to [place] room 11 by wheelchair, stable, report given to RN, belongings with pt.

## 2015-10-31 NOTE — Progress Notes (Signed)
  Echocardiogram 2D Echocardiogram has been performed.  Darlina Sicilian M 10/31/2015, 12:53 PM

## 2015-10-31 NOTE — Progress Notes (Signed)
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 The patient states that Tuesday a week ago she presented to [facility] ER with SOB and chest pain.  Cath on 3/16 showed occluded LAD with moderate CAD in the LCx.  Medical management was recommended.    Objective:    Vital Signs:   Temp:  [97.5 F (36.4 C)-98.7 F (37.1 C)] 97.5 F (36.4 C) (03/17 0400) Pulse Rate:  [52-104] 67 (03/17 0500) Resp:  [7-30] 14 (03/17 0500) BP: (84-181)/(53-94) 119/64 mmHg (03/17 0500) SpO2:  [0 %-100 %] 97 % (03/17 0500) Weight:  [138 lb 3.7 oz (62.7 kg)-145 lb 11.6 oz (66.1 kg)] 138 lb 3.7 oz (62.7 kg) (03/17 0457)  Last BM Date: 10/29/15   24-hour weight change: Weight change: 3 lb 11.6 oz (1.689 kg)  Weight trends: [facility] Weights   10/29/15 2145 10/30/15 1700 10/31/15 0457  Weight: 142 lb (64.411 kg) 145 lb 11.6 oz (66.1 kg) 138 lb 3.7 oz (62.7 kg)    Intake/Output:  03/16 0701 - 03/17 0700 In: 72.7 [I.V.:72.7] Out: 1250 [Urine:1250]     Physical Exam: BP 119/64 mmHg  Pulse 67  Temp(Src) 97.5 F (36.4 C) (Oral)  Resp 14  Ht 5\' 1"  (1.549 m)  Wt 138 lb 3.7 oz (62.7 kg)  BMI 26.13 kg/m2  SpO2 97%  Wt Readings from Last 3 Encounters:  10/31/15 138 lb 3.7 oz (62.7 kg)    General: Vital signs reviewed and noted.   Head: Normocephalic, atraumatic.  Eyes: conjunctivae/corneas clear.  EOM's intact.   Throat: normal  Neck:  normal   Lungs:    clear  Heart:  RR, normal S1S2  Abdomen:  Soft, non-tender, non-distended    Extremities: No c/c/e , right radial and brachial cath sites look good    Neurologic: A&O X3, CN II - XII are grossly intact.   Psych: Normal     Labs: BMET:  Recent Labs  10/29/15 2213 10/30/15 0354  NA 144 144  K 4.6 4.7  CL 105 106  CO2 26 26  GLUCOSE 125*  113*  BUN 19 17  CREATININE 1.19* 1.13*  CALCIUM 9.4 9.5  MG  --  2.2    Liver function tests:  Recent Labs  10/29/15 2213 10/30/15 0354  AST 39 37  ALT 29 27  ALKPHOS 64 60  BILITOT 0.5 0.3  PROT 6.3* 6.1*  ALBUMIN 3.5 3.5   No results for input(s): LIPASE, AMYLASE in the last 72 hours.  CBC:  Recent Labs  10/30/15 0354 10/31/15 0440  WBC 12.1* 9.5  NEUTROABS 6.9  --   HGB 12.1 11.4*  HCT 38.5 36.6  MCV 98.5 96.6  PLT 198 204    Cardiac Enzymes:  Recent Labs  10/29/15 2213 10/30/15 0354 10/30/15 0855 10/30/15 1735  TROPONINI 0.29* 0.50* 2.52* 3.22*    Coagulation Studies:  Recent Labs  10/30/15 0354 10/30/15 0855  LABPROT 15.0 14.5  INR 1.16 1.12    Other: Invalid input(s): POCBNP No results for input(s): DDIMER in the last 72 hours. No results for input(s): HGBA1C in the last 72 hours.  Recent Labs  10/30/15 0855  CHOL 186  HDL 40*  LDLCALC 118*  TRIG 139  CHOLHDL 4.7    Recent Labs  10/30/15 0359  TSH 0.345*   No results for input(s): VITAMINB12, FOLATE, FERRITIN, TIBC, IRON, RETICCTPCT in the last 72 hours.   Other results:  EKG  ( personally reviewed )  NSR , old septal MI,    Medications:    Infusions: . nitroGLYCERIN Stopped (10/30/15 2339)    Scheduled Medications: . aspirin  324 mg Oral Once  . aspirin EC  81 mg Oral Daily  . atorvastatin  80 mg Oral q1800  . carvedilol  6.25 mg Oral BID WC  . cloNIDine  0.1 mg Oral BID  . furosemide  20 mg Intravenous BID  . gabapentin  200 mg Oral TID  . heparin  5,000 Units Subcutaneous 3 times per day  . pantoprazole  40 mg Oral Daily  . sertraline  100 mg Oral QHS  . sodium chloride flush  3 mL Intravenous Q12H    Assessment/ Plan:   Active Problems:   NSTEMI (non-ST elevated myocardial infarction) ([place])  1. CAD - has an occluded LAD that fills via collaterals. Moderate CAD elsewhere.   Medical therapy was advised. Echo to be done to evaluate LV function EF  around 35% by LVG Continue ASA 81, atorva,   2. Chronic systolic CHF -   Getting started on Coreg.  Currently on clonidine,  Will slowly taper off the clonidine and start Losartan  Will start losartan 25 mg a day  DC clonidine ( it does not seem like we need much of a taper but I would give small doses if she starts to have any rebound HTN)    3. Hyperlipidemia - continue atorvastatin     Disposition: transfer to tele  Length of Stay: 1  Thayer Headings, Brooke Bonito., MD, [facility] 10/31/2015, 8:06 AM Office [phone] Pager [phone]

## 2015-11-01 LAB — BASIC METABOLIC PANEL
Anion gap: 16 — ABNORMAL HIGH (ref 5–15)
BUN: 22 mg/dL — AB (ref 6–20)
CHLORIDE: 108 mmol/L (ref 101–111)
CO2: 20 mmol/L — AB (ref 22–32)
CREATININE: 1.14 mg/dL — AB (ref 0.44–1.00)
Calcium: 9.4 mg/dL (ref 8.9–10.3)
GFR calc Af Amer: 50 mL/min — ABNORMAL LOW (ref >60)
GFR calc non Af Amer: 43 mL/min — ABNORMAL LOW (ref >60)
Glucose, Bld: 87 mg/dL (ref 65–99)
POTASSIUM: 5.4 mmol/L — AB (ref 3.5–5.1)
SODIUM: 144 mmol/L (ref 135–145)

## 2015-11-01 LAB — CBC
HEMATOCRIT: 37.8 % (ref 36.0–46.0)
HEMOGLOBIN: 11.7 g/dL — AB (ref 12.0–15.0)
MCH: 30.3 pg (ref 26.0–34.0)
MCHC: 31 g/dL (ref 30.0–36.0)
MCV: 97.9 fL (ref 78.0–100.0)
Platelets: 186 10*3/uL (ref 150–400)
RBC: 3.86 MIL/uL — AB (ref 3.87–5.11)
RDW: 13.5 % (ref 11.5–15.5)
WBC: 11.5 10*3/uL — ABNORMAL HIGH (ref 4.0–10.5)

## 2015-11-01 LAB — T4, FREE: FREE T4: 1.11 ng/dL (ref 0.61–1.12)

## 2015-11-01 MED ORDER — ALPRAZOLAM 0.25 MG PO TABS
0.2500 mg | ORAL_TABLET | Freq: Once | ORAL | Status: AC
  Administered 2015-11-01: 0.25 mg via ORAL
  Filled 2015-11-01: qty 1

## 2015-11-01 MED ORDER — LISINOPRIL 5 MG PO TABS
5.0000 mg | ORAL_TABLET | Freq: Every day | ORAL | Status: DC
  Administered 2015-11-01 – 2015-11-02 (×2): 5 mg via ORAL
  Filled 2015-11-01 (×2): qty 1

## 2015-11-01 MED ORDER — CLOPIDOGREL BISULFATE 75 MG PO TABS
75.0000 mg | ORAL_TABLET | Freq: Every day | ORAL | Status: DC
  Administered 2015-11-01 – 2015-11-02 (×2): 75 mg via ORAL
  Filled 2015-11-01 (×2): qty 1

## 2015-11-01 NOTE — Progress Notes (Addendum)
    Subjective:  Denies CP or dyspnea   Objective:  Filed Vitals:   10/31/15 1718 10/31/15 2109 11/01/15 0158 11/01/15 0403  BP: 138/67 169/68  149/72  Pulse: 78 73  75  Temp:  98.2 F (36.8 C)  98.3 F (36.8 C)  TempSrc:  Oral  Oral  Resp:  18  18  Height:      Weight:   138 lb 14.2 oz (63 kg)   SpO2:  98%  95%    Intake/Output from previous day:  Intake/Output Summary (Last 24 hours) at 11/01/15 1259 Last data filed at 11/01/15 1015  Gross per 24 hour  Intake    403 ml  Output    950 ml  Net   -547 ml    Physical Exam: Physical exam: Well-developed well-nourished in no acute distress.  Skin is warm and dry.  HEENT is normal.  Neck is supple.  Chest is clear to auscultation with normal expansion.  Cardiovascular exam is regular rate and rhythm.  Abdominal exam nontender or distended. No masses palpated. Extremities show no edema. neuro grossly intact    Lab Results: Basic Metabolic Panel:  Recent Labs  10/30/15 0354 11/01/15 0429  NA 144 144  K 4.7 5.4*  CL 106 108  CO2 26 20*  GLUCOSE 113* 87  BUN 17 22*  CREATININE 1.13* 1.14*  CALCIUM 9.5 9.4  MG 2.2  --    CBC:  Recent Labs  10/30/15 0354 10/31/15 0440 11/01/15 0429  WBC 12.1* 9.5 11.5*  NEUTROABS 6.9  --   --   HGB 12.1 11.4* 11.7*  HCT 38.5 36.6 37.8  MCV 98.5 96.6 97.9  PLT 198 204 186   Cardiac Enzymes:  Recent Labs  10/30/15 0354 10/30/15 0855 10/30/15 1735  TROPONINI 0.50* 2.52* 3.22*     Assessment/Plan:  1 status post myocardial infarction-cath results noted. Plan medical therapy. Continue aspirin and statin. Add Plavix. 2 ischemic cardiomyopathy-continue carvedilol. Add lisinopril.  3 Decreased TSH-Check free T3 and free T4. 4 Hyperlipidemia-continue statin. 5 acute systolic CHF-improved. Continue present dose of Lasix.  6 hypertension-blood pressure elevated. Add lisinopril and increase as needed.  Probable discharge tomorrow morning if patient is  stable. Kirk Ruths 11/01/2015, 12:59 PM

## 2015-11-01 NOTE — Progress Notes (Signed)
CARDIAC REHAB PHASE I   PRE:  Rate/Rhythm: 74  BP:   Sitting: 112/65     SaO2: 96% RA  MODE:  Ambulation: 132 ft   POST:  Rate/Rhythm: 79  BP:   Sitting: 135/76     SaO2: 96% RA 1410-1447  Pt ambulated 132 feet with one person assist and rolling walker, encouraged proper posture with use of walker. Pt maintained a steady gait. Pt denies SOB, dizziness and CP. Returned to recliner with LE elevated and VSS. Did education on exercise guidelines. Left call bell, cell phone and room phone in reach.  Alleta Avery D Tommy Minichiello,MS,ACSM-RCEP 11/01/2015 2:46 PM

## 2015-11-02 ENCOUNTER — Encounter (HOSPITAL_COMMUNITY): Payer: Self-pay | Admitting: Nurse Practitioner

## 2015-11-02 DIAGNOSIS — I2721 Secondary pulmonary arterial hypertension: Secondary | ICD-10-CM

## 2015-11-02 DIAGNOSIS — I255 Ischemic cardiomyopathy: Secondary | ICD-10-CM

## 2015-11-02 DIAGNOSIS — E785 Hyperlipidemia, unspecified: Secondary | ICD-10-CM

## 2015-11-02 DIAGNOSIS — I119 Hypertensive heart disease without heart failure: Secondary | ICD-10-CM

## 2015-11-02 DIAGNOSIS — I2510 Atherosclerotic heart disease of native coronary artery without angina pectoris: Secondary | ICD-10-CM

## 2015-11-02 DIAGNOSIS — I5022 Chronic systolic (congestive) heart failure: Secondary | ICD-10-CM

## 2015-11-02 LAB — BASIC METABOLIC PANEL
Anion gap: 8 (ref 5–15)
BUN: 19 mg/dL (ref 6–20)
CO2: 27 mmol/L (ref 22–32)
Calcium: 9.4 mg/dL (ref 8.9–10.3)
Chloride: 107 mmol/L (ref 101–111)
Creatinine, Ser: 1.17 mg/dL — ABNORMAL HIGH (ref 0.44–1.00)
GFR calc Af Amer: 49 mL/min — ABNORMAL LOW (ref >60)
GFR calc non Af Amer: 42 mL/min — ABNORMAL LOW (ref >60)
Glucose, Bld: 118 mg/dL — ABNORMAL HIGH (ref 65–99)
Potassium: 3.7 mmol/L (ref 3.5–5.1)
Sodium: 142 mmol/L (ref 135–145)

## 2015-11-02 LAB — CBC
HCT: 37.9 % (ref 36.0–46.0)
Hemoglobin: 11.9 g/dL — ABNORMAL LOW (ref 12.0–15.0)
MCH: 30.5 pg (ref 26.0–34.0)
MCHC: 31.4 g/dL (ref 30.0–36.0)
MCV: 97.2 fL (ref 78.0–100.0)
Platelets: 206 10*3/uL (ref 150–400)
RBC: 3.9 MIL/uL (ref 3.87–5.11)
RDW: 13.5 % (ref 11.5–15.5)
WBC: 11.9 10*3/uL — ABNORMAL HIGH (ref 4.0–10.5)

## 2015-11-02 LAB — T3, FREE: T3, Free: 2.7 pg/mL (ref 2.0–4.4)

## 2015-11-02 MED ORDER — ATORVASTATIN CALCIUM 80 MG PO TABS: 80.0000 mg | ORAL_TABLET | Freq: Every day | ORAL | Status: DC

## 2015-11-02 MED ORDER — CLOPIDOGREL BISULFATE 75 MG PO TABS: 75.0000 mg | ORAL_TABLET | Freq: Every day | ORAL | Status: DC

## 2015-11-02 MED ORDER — NITROGLYCERIN 0.4 MG SL SUBL: 0.4000 mg | SUBLINGUAL_TABLET | SUBLINGUAL | Status: DC | PRN

## 2015-11-02 MED ORDER — FUROSEMIDE 20 MG PO TABS: 20.0000 mg | ORAL_TABLET | Freq: Every day | ORAL | Status: DC

## 2015-11-02 MED ORDER — LISINOPRIL 5 MG PO TABS: 5.0000 mg | ORAL_TABLET | Freq: Every day | ORAL | Status: DC

## 2015-11-02 MED ORDER — CARVEDILOL 6.25 MG PO TABS
6.2500 mg | ORAL_TABLET | Freq: Two times a day (BID) | ORAL | Status: DC
Start: 2015-11-02 — End: 2015-11-13

## 2015-11-02 MED ORDER — PANTOPRAZOLE SODIUM 40 MG PO TBEC: 40.0000 mg | DELAYED_RELEASE_TABLET | Freq: Every day | ORAL | Status: DC

## 2015-11-02 NOTE — Discharge Instructions (Signed)
**  PLEASE REMEMBER TO BRING ALL OF YOUR MEDICATIONS TO EACH OF YOUR FOLLOW-UP OFFICE VISITS. ° °NO HEAVY LIFTING X 2 WEEKS. °NO SEXUAL ACTIVITY X 2 WEEKS. °NO DRIVING X 1 WEEK. °NO SOAKING BATHS, HOT TUBS, POOLS, ETC., X 7 DAYS. ° °Radial Site Care °Refer to this sheet in the next few weeks. These instructions provide you with information on caring for yourself after your procedure. Your caregiver may also give you more specific instructions. Your treatment has been planned according to current medical practices, but problems sometimes occur. Call your caregiver if you have any problems or questions after your procedure. °HOME CARE INSTRUCTIONS °· You may shower the day after the procedure. Remove the bandage (dressing) and gently wash the site with plain soap and water. Gently pat the site dry.  °· Do not apply powder or lotion to the site.  °· Do not submerge the affected site in water for 3 to 5 days.  °· Inspect the site at least twice daily.  °· Do not flex or bend the affected arm for 24 hours.  °· No lifting over 5 pounds (2.3 kg) for 5 days after your procedure.  ° °What to expect: °· Any bruising will usually fade within 1 to 2 weeks.  °· Blood that collects in the tissue (hematoma) may be painful to the touch. It should usually decrease in size and tenderness within 1 to 2 weeks.  °SEEK IMMEDIATE MEDICAL CARE IF: °· You have unusual pain at the radial site.  °· You have redness, warmth, swelling, or pain at the radial site.  °· You have drainage (other than a small amount of blood on the dressing).  °· You have chills.  °· You have a fever or persistent symptoms for more than 72 hours.  °· You have a fever and your symptoms suddenly get worse.  °· Your arm becomes pale, cool, tingly, or numb.  °· You have heavy bleeding from the site. Hold pressure on the site.  ° °

## 2015-11-02 NOTE — Care Management Note (Signed)
Case Management Note  Patient Details  Name: Katherine [name] MRN: [id number] Date of Birth: 08/14/1930  Subjective/Objective:                  Chest pain  Action/Plan: CM spoke with the patient and her sister at the bedside. They selected [facility] for [facility]. Tiffany at [facility] notified of the referral. The patient is from [place]. She will be staying with her sister in [place] at [facility] [place]. She has her cell phone and confirms she can be reached at the telephone number listed on the facesheet.   Expected Discharge Date:   11/02/15               Expected Discharge Plan:  [place]  In-House Referral:     Discharge planning Services  CM Consult  Post Acute Care Choice:   Patient, Sister Choice offered to:   Home Health   DME Arranged:  N/A DME Agency:  NA  HH Arranged:  RN [place] Agency:  [facility]  Status of Service:  Completed, signed off  Medicare Important Message Given:    Date Medicare IM Given:    Medicare IM give by:    Date Additional Medicare IM Given:    Additional Medicare Important Message give by:     If discussed at [place] of Stay Meetings, dates discussed:    Additional Comments:  Apolonio Schneiders, RN 11/02/2015, 1:12 PM

## 2015-11-02 NOTE — Discharge Summary (Signed)
Discharge Summary    Patient ID: Katherine [name],  MRN: [id number], DOB/AGE: 03-09-1930 82 y.o.  Admit date: 10/29/2015 Discharge date: 11/02/2015  Primary Care Provider: No primary care provider on file. Primary Cardiologist: Pt to establish care in [place] upon returning home.  Discharge Diagnoses    Principal Problem:   NSTEMI (non-ST elevated myocardial infarction) (HCC)  **S/P Cath this admission revealing CTO of the LAD and nonobs LCX dzs  Med Rx.  Active Problems:   CAD (coronary artery disease)   CKD (chronic kidney disease), stage III   Hypertensive heart disease   Cardiomyopathy, ischemic  **EF 35-40% by echo this admission.   Chronic systolic CHF (congestive heart failure) (HCC)  **Discharge weight 141 lbs.   PAH (pulmonary artery hypertension) (HCC)   Hyperlipidemia   Allergies No Known Allergies  Diagnostic Studies/Procedures   Right and Left Heart Cardiac Catheterization 3.16.2016   Coronary Findings     Dominance: Right    Left Main  . Vessel is angiographically normal.   . Ost LM to LM lesion, 20% stenosed.      Left Anterior Descending   . Prox LAD lesion, 100% stenosed.   . First Diagonal Branch   . Ost 1st Diag to 1st Diag lesion, 80% stenosed.      Left Circumflex   . Ost Cx lesion, 50% stenosed.   . Prox Cx lesion, 60% stenosed.    **FFR normal at 0.92  Med Rx.   . First Obtuse Marginal Branch   The vessel is small in size and exhibits minimal luminal irregularities.   . Second Obtuse Marginal Branch   The vessel is small in size and is angiographically normal.   . Third Obtuse Marginal Branch   The vessel is large in size and is angiographically normal.      Right Coronary Artery  . Vessel is angiographically normal.   . Right Posterior Descending Artery   The vessel exhibits minimal luminal irregularities.   . Right Posterior Atrioventricular Branch   The vessel exhibits minimal luminal irregularities.   . First Right Posterolateral     The vessel is angiographically normal.   . Second Right Posterolateral   The vessel is angiographically normal.         Right Heart Pressures Hemodynamic findings consistent with mild pulmonary hypertension. Elevated LV EDP consistent with volume overload.  Left Ventricle The left ventricle is enlarged. There is moderate to severe left ventricular systolic dysfunction. The ejection fraction is calculated to be 35%. Ejection fraction quantitative: 35%. There are wall motion abnormalities in the left ventricle. There are segmental wall motion abnormalities in the left ventricle.   Aortic Valve There is no aortic valve stenosis.  Fick Cardiac Output  3.87 L/min    Fick Cardiac Output Index  2.37 (L/min)/BSA   RA A Wave  9 mmHg   RA V Wave  6 mmHg   RA Mean  5 mmHg   RV Systolic Pressure  42 mmHg   RV Diastolic Pressure  2 mmHg   RV EDP  7 mmHg   PA Systolic Pressure  41 mmHg   PA Diastolic Pressure  16 mmHg   PA Mean  25 mmHg   PW A Wave  34 mmHg   PW V Wave  21 mmHg   PW Mean  22 mmHg   QP/QS  1   TPVR Index  10.51 HRUI   TSVR Index  49.23 HRUI   PVR SVR Ratio  0.03   TPVR/TSVR Ratio  0.21  _____________   2D Echocardiogram 3.17.2017  Study Conclusions   - Left ventricle: The cavity size was normal. There was mild focal   basal hypertrophy of the septum. Systolic function was moderately   reduced. The estimated ejection fraction was in the range of 35%   to 40%. There is akinesis of the mid-apicalanteroseptal   myocardium. There is akinesis of the apical myocardium. Doppler   parameters are consistent with abnormal left ventricular   relaxation (grade 1 diastolic dysfunction). - Aortic valve: There was trivial regurgitation.   Impressions:   - Mid/distal anteroseptal and apical akinesis with overall moderate   LV dysfunction; no apical thrombus using definity; grade 1   diastolic dysfunction; trace AI and TR. _____________   History of Present  Illness     82 year old female with a history of remote myocardial infarction, CHF, chronic kidney disease, and COPD. She is from [place] but visiting in the [place] area with her daughter currently. She was recently evaluated at the [facility] emergency room secondary to chest pain and shortness of breath with subsequent finding of mild troponin elevation and creatinine of 1.56. She underwent stress testing revealing anterior scar with minimal ischemia and severe LV dysfunction. Echo showed EF of 50-55% however. CT of the chest showed no PE. She was started on beta blocker, ARB, Lasix, and hydralazine and was discharged home.  On March 15, she developed recurrent chest pain radiating to her neck, associated with nausea, vomiting, and dyspnea. She presented to the [facility] emergency department where ECG was nonacute and she was again found to have an elevated troponin of 0.29. She was placed on heparin and IV nitroglycerin as well as aspirin, beta blocker, and statin. She was admitted for further evaluation.  Hospital Course     Consultants: None   Patient ruled in for non-ST segment elevation myocardial infarction, eventually peaking her troponin at 3.22. She underwent diagnostic catheterization on March 16 revealing a chronic total occlusion of the LAD with moderate left circumflex disease and otherwise minor irregularities. Fractional flow reserve was performed within the left circumflex and was normal at 0.92. Medical therapy was recommended. EF was 35% and right heart catheterization revealed pulmonary hypertension and elevated filling pressures with a pulmonary capillary wedge pressure of 22. She was treated with 1 dose of IV Lasix and this was subsequently transitioned to oral Lasix.  We also titrated beta blocker therapy while adding ACE inhibitor and weaning off her previous home dose of clonidine. Following gentle diuresis, she has remained euvolemic and her weight is 141 pounds this  morning. Echocardiogram showed an EF 35-40% with grade 1 diastolic dysfunction. Of note, TSH was found to be mildly suppressed @ 3.22, however T3 and FT4 were wnl.  She has been seen by cardiac rehabilitation and has been dealing with a walker, which is her baseline. We have asked case management to assist in arranging home health nursing as the patient will be in the [place] area for an additional 2 weeks prior to returning to [place]. She will be discharged home today and we will arrange for follow-up in our office within 1 week. _____________  Discharge Vitals Blood pressure 125/65, pulse 87, temperature 98.3 F (36.8 C), temperature source Oral, resp. rate 20, height 5\' 1"  (1.549 m), weight 141 lb 12.1 oz (64.3 kg), SpO2 98 %.  Filed Weights   10/31/15 0457 11/01/15 0158 11/02/15 0451  Weight: 138 lb 3.7 oz (62.7 kg)  138 lb 14.2 oz (63 kg) 141 lb 12.1 oz (64.3 kg)    Labs & Radiologic Studies    CBC  Recent Labs  11/01/15 0429 11/02/15 0316  WBC 11.5* 11.9*  HGB 11.7* 11.9*  HCT 37.8 37.9  MCV 97.9 97.2  PLT 186 [id number]   Basic Metabolic Panel  Recent Labs  11/01/15 0429 11/02/15 0316  NA 144 142  K 5.4* 3.7  CL 108 107  CO2 20* 27  GLUCOSE 87 118*  BUN 22* 19  CREATININE 1.14* 1.17*  CALCIUM 9.4 9.4   Cardiac Enzymes  Recent Labs  10/30/15 1735  TROPONINI 3.22*   Thyroid Function Tests  Recent Labs  11/01/15 1500  T3FREE 2.7  Free T4 1.11 _____________   Dg Chest 2 View  10/29/2015  CLINICAL DATA:  Mid chest pain, vomiting since 4 p.m. EXAM: CHEST  2 VIEW COMPARISON:  None. FINDINGS: Cardiomegaly, mild to moderate in degree. Atherosclerotic changes noted at the aortic arch. Lungs are clear. Lung volumes are normal. No pleural effusion or pneumothorax seen. Osseous and soft tissue structures about the chest are unremarkable. IMPRESSION: Cardiomegaly, mild to moderate in degree.  No evidence of CHF. Lungs are clear.  No evidence of pneumonia. Electronically  Signed   By: Franki Cabot M.D.   On: 10/29/2015 22:50  _____________  Disposition   Pt is being discharged home today in good condition.  Follow-up Plans & Appointments    Follow-up Information    Follow up with [facility] In 1 week.   Why:  we will arrange for follow-up and contact you.   Contact information:   [place] [phone]     Discharge Instructions    Amb Referral to Cardiac Rehabilitation    Complete by:  As directed   Diagnosis:  Myocardial Infarction Comment - visiting sister.. pt stated has one short leg and cannot do bike, treadmill            Discharge Medications   Current Discharge Medication List    START taking these medications   Details  atorvastatin (LIPITOR) 80 MG tablet Take 1 tablet (80 mg total) by mouth daily at 6 PM. Qty: 30 tablet, Refills: 3    carvedilol (COREG) 6.25 MG tablet Take 1 tablet (6.25 mg total) by mouth 2 (two) times daily with a meal. Qty: 60 tablet, Refills: 3    clopidogrel (PLAVIX) 75 MG tablet Take 1 tablet (75 mg total) by mouth daily. Qty: 30 tablet, Refills: 3    lisinopril (PRINIVIL,ZESTRIL) 5 MG tablet Take 1 tablet (5 mg total) by mouth daily. Qty: 30 tablet, Refills: 3    nitroGLYCERIN (NITROSTAT) 0.4 MG SL tablet Place 1 tablet (0.4 mg total) under the tongue every 5 (five) minutes as needed for chest pain. Qty: 25 tablet, Refills: 3    pantoprazole (PROTONIX) 40 MG tablet Take 1 tablet (40 mg total) by mouth daily. Qty: 30 tablet, Refills: 3      CONTINUE these medications which have CHANGED   Details  furosemide (LASIX) 20 MG tablet Take 1 tablet (20 mg total) by mouth daily. Qty: 30 tablet, Refills: 3      CONTINUE these medications which have NOT CHANGED   Details  aspirin EC 81 MG tablet Take 81 mg by mouth daily.    gabapentin (NEURONTIN) 100 MG capsule Take 200 mg by mouth 3 (three) times  daily.    Omega-3 Fatty  Acids (FISH OIL) 1000 MG CAPS Take 1,000 mg by mouth daily.    sertraline (ZOLOFT) 100 MG tablet Take 100 mg by mouth at bedtime.     traMADol (ULTRAM) 50 MG tablet Take 50 mg by mouth every 12 (twelve) hours as needed for moderate pain.       STOP taking these medications     cloNIDine (CATAPRES) 0.2 MG tablet      lansoprazole (PREVACID) 30 MG capsule          Aspirin prescribed at discharge?  Yes High Intensity Statin Prescribed? (Lipitor 40-80mg  or Crestor 20-40mg ): Yes Beta Blocker Prescribed? Yes For EF 45% or less, Was ACEI/ARB Prescribed? Yes ADP Receptor Inhibitor Prescribed? (i.e. Plavix etc.-Includes Medically Managed Patients): Yes For EF <40%, Aldosterone Inhibitor Prescribed? No: Defer to outpt setting. Was EF assessed during THIS hospitalization? Yes Was Cardiac Rehab II ordered? (Included Medically managed Patients): Yes   Outstanding Labs/Studies   None  Duration of Discharge Encounter   Greater than 30 minutes including physician time.  SignedMurray Hodgkins NP 11/02/2015, 12:04 PM

## 2015-11-02 NOTE — Progress Notes (Signed)
    Subjective:  Denies CP or dyspnea   Objective:  Filed Vitals:   11/01/15 1555 11/01/15 1936 11/02/15 0451 11/02/15 0916  BP: 141/82 142/67 136/77 125/65  Pulse: 76 80 73 87  Temp: 97.8 F (36.6 C) 98.3 F (36.8 C) 98.4 F (36.9 C) 98.3 F (36.8 C)  TempSrc: Oral Oral Oral Oral  Resp: 17 16 18 20   Height:      Weight:   141 lb 12.1 oz (64.3 kg)   SpO2: 99% 97% 97% 98%    Intake/Output from previous day:  Intake/Output Summary (Last 24 hours) at 11/02/15 1055 Last data filed at 11/01/15 2210  Gross per 24 hour  Intake      0 ml  Output    100 ml  Net   -100 ml    Physical Exam: Physical exam: Well-developed well-nourished in no acute distress.  Skin is warm and dry.  HEENT is normal.  Neck is supple.  Chest is clear to auscultation with normal expansion.  Cardiovascular exam is regular rate and rhythm.  Abdominal exam nontender or distended. No masses palpated. Extremities show no edema. neuro grossly intact    Lab Results: Basic Metabolic Panel:  Recent Labs  11/01/15 0429 11/02/15 0316  NA 144 142  K 5.4* 3.7  CL 108 107  CO2 20* 27  GLUCOSE 87 118*  BUN 22* 19  CREATININE 1.14* 1.17*  CALCIUM 9.4 9.4   CBC:  Recent Labs  11/01/15 0429 11/02/15 0316  WBC 11.5* 11.9*  HGB 11.7* 11.9*  HCT 37.8 37.9  MCV 97.9 97.2  PLT 186 206   Cardiac Enzymes:  Recent Labs  10/30/15 1735  TROPONINI 3.22*     Assessment/Plan:  1 status post myocardial infarction-cath results noted. Plan medical therapy. Continue aspirin, plavix and statin. 2 ischemic cardiomyopathy-continue carvedilol and lisinopril.  3 Decreased TSH-free T3 and free T4 normal. 4 Hyperlipidemia-continue statin. 5 acute systolic CHF-improved. Continue present dose of Lasix.  6 hypertension-blood pressure improved; titrate meds as outpt Patient is ready for discharge today. However her son feels that she may require home physical therapy to regain strength and I think this is  reasonable. We will try to arrange today. She lives in [place] but will stay with her sister until her strength improves. I will have her seen in 1 week TOC appt. She will then need a cardiologist and internist in [place]. > 30 min PA and physician time D2  Kirk Ruths 11/02/2015, 10:55 AM

## 2015-11-02 NOTE — Progress Notes (Signed)
Pt D/C home per MD order, D/C instructions reviewed with pt and sister, all questions answered. Pt aware of follow up appt., Pt given paper prescription for her meds. IV removed and site looks clean and intact, Pt verbalized understanding of discharged instructions.

## 2015-11-04 DIAGNOSIS — I130 Hypertensive heart and chronic kidney disease with heart failure and stage 1 through stage 4 chronic kidney disease, or unspecified chronic kidney disease: Secondary | ICD-10-CM | POA: Diagnosis not present

## 2015-11-04 DIAGNOSIS — J449 Chronic obstructive pulmonary disease, unspecified: Secondary | ICD-10-CM | POA: Diagnosis not present

## 2015-11-04 DIAGNOSIS — I272 Other secondary pulmonary hypertension: Secondary | ICD-10-CM | POA: Diagnosis not present

## 2015-11-04 DIAGNOSIS — N183 Chronic kidney disease, stage 3 (moderate): Secondary | ICD-10-CM | POA: Diagnosis not present

## 2015-11-04 DIAGNOSIS — I214 Non-ST elevation (NSTEMI) myocardial infarction: Secondary | ICD-10-CM | POA: Diagnosis not present

## 2015-11-04 DIAGNOSIS — I5022 Chronic systolic (congestive) heart failure: Secondary | ICD-10-CM | POA: Diagnosis not present

## 2015-11-04 DIAGNOSIS — E785 Hyperlipidemia, unspecified: Secondary | ICD-10-CM | POA: Diagnosis not present

## 2015-11-04 DIAGNOSIS — I255 Ischemic cardiomyopathy: Secondary | ICD-10-CM | POA: Diagnosis not present

## 2015-11-06 ENCOUNTER — Telehealth: Payer: Self-pay | Admitting: Cardiovascular Disease

## 2015-11-06 NOTE — Telephone Encounter (Signed)
Patient wants tcm appt with Dr. Fletcher Anon .   Kevan Ny ( cath Management consultant) is her niece  says she ok'd this with Dr. Fletcher Anon.  Please let me know if it is ok to work her in on 11-21-15 Maybe in the afternoon or if Fletcher Anon had another day in mind.  Thanks!

## 2015-11-06 NOTE — Telephone Encounter (Signed)
Per Meriam Sprague, she has spoken with pt and Kevan Ny, niece, who are both aware of April appt w/Dr. Fletcher Anon.

## 2015-11-06 NOTE — Telephone Encounter (Signed)
S/p cath at [place] with Dr. Fletcher Anon 11-21-15 at 3:30 pm

## 2015-11-10 ENCOUNTER — Encounter: Payer: Medicare HMO | Admitting: Physician Assistant

## 2015-11-10 ENCOUNTER — Encounter: Payer: Self-pay | Admitting: Physician Assistant

## 2015-11-10 ENCOUNTER — Ambulatory Visit (INDEPENDENT_AMBULATORY_CARE_PROVIDER_SITE_OTHER): Payer: Medicare HMO | Admitting: Physician Assistant

## 2015-11-10 VITALS — BP 168/74 | HR 70 | Ht 61.0 in | Wt 147.2 lb

## 2015-11-10 DIAGNOSIS — I255 Ischemic cardiomyopathy: Secondary | ICD-10-CM | POA: Diagnosis not present

## 2015-11-10 DIAGNOSIS — I119 Hypertensive heart disease without heart failure: Secondary | ICD-10-CM

## 2015-11-10 DIAGNOSIS — I214 Non-ST elevation (NSTEMI) myocardial infarction: Secondary | ICD-10-CM | POA: Diagnosis not present

## 2015-11-10 DIAGNOSIS — I2510 Atherosclerotic heart disease of native coronary artery without angina pectoris: Secondary | ICD-10-CM | POA: Diagnosis not present

## 2015-11-10 DIAGNOSIS — I2721 Secondary pulmonary arterial hypertension: Secondary | ICD-10-CM

## 2015-11-10 DIAGNOSIS — E785 Hyperlipidemia, unspecified: Secondary | ICD-10-CM

## 2015-11-10 DIAGNOSIS — N183 Chronic kidney disease, stage 3 unspecified: Secondary | ICD-10-CM

## 2015-11-10 DIAGNOSIS — I272 Other secondary pulmonary hypertension: Secondary | ICD-10-CM

## 2015-11-10 LAB — BASIC METABOLIC PANEL
BUN: 18 mg/dL (ref 7–25)
CALCIUM: 9.5 mg/dL (ref 8.6–10.4)
CO2: 31 mmol/L (ref 20–31)
Chloride: 104 mmol/L (ref 98–110)
Creat: 1.07 mg/dL — ABNORMAL HIGH (ref 0.60–0.88)
GLUCOSE: 96 mg/dL (ref 65–99)
Potassium: 4.9 mmol/L (ref 3.5–5.3)
Sodium: 141 mmol/L (ref 135–146)

## 2015-11-10 MED ORDER — LISINOPRIL 10 MG PO TABS: 10.0000 mg | ORAL_TABLET | Freq: Every day | ORAL | Status: DC

## 2015-11-10 NOTE — Assessment & Plan Note (Signed)
EF 35%. No evidence of heart failure on exam.

## 2015-11-10 NOTE — Assessment & Plan Note (Signed)
Will need fasting lipid panel and LFTs in 6 weeks.

## 2015-11-10 NOTE — Patient Instructions (Addendum)
Medication Instructions:   START TAKING LISINOPRIL 10 MG ONCE A DAY   If you need a refill on your cardiac medications before your next appointment, please call your pharmacy.  Labwork:BMET   RETURN IN 6 WEEKS FOR FASTING LIPIDS AND LFT.Marland Kitchen   Testing/Procedures: NONE ORDER TODAY    Follow-Up: WITH DR TURNER IN ONE MONTH OR NEXT ASAP AVAILABLE APPT SLOT    Any Other Special Instructions Will Be Listed Below (If Applicable).

## 2015-11-10 NOTE — Assessment & Plan Note (Signed)
The pressure is elevated. Increase lisinopril to 10 mg daily. Check renal function.

## 2015-11-10 NOTE — Progress Notes (Signed)
This encounter was created in error - please disregard.

## 2015-11-10 NOTE — Assessment & Plan Note (Signed)
Check renal function today

## 2015-11-10 NOTE — Assessment & Plan Note (Signed)
Patient had recent non-ST elevation MI with cath revealing total LAD with moderate left circumflex and otherwise minor irregularities. EF 35%. She also had right heart catheterization revealing pulmonary hypertension and elevated filling pressures. Patient has had no recurrent chest pain. Her blood pressure is elevated today. Recommend increase lisinopril to 10 mg daily. She is considering staying here in [place] with her sister and would then follow-up with Dr. Radford Pax. We'll make arrangements. We'll check labs today.

## 2015-11-10 NOTE — Progress Notes (Signed)
Cardiology Office Note   Date:  11/10/2015   ID:  Katherine [name], DOB [birth year], MRN [id number]  PCP:  No PCP Per Patient  Cardiologist:  Dr. Radford Pax   Chief Complaint: Weakness    History of Present Illness: Katherine [name] is a 82 y.o. female who presents for post hospital follow-up. She has a history of remote myocardial infarction, CHF, chronic kidney disease, and COPD. She is from [place] but visiting in the [place] area with her sister currently. She was recently evaluated at the [facility] emergency room secondary to chest pain and shortness of breath with subsequent finding of mild troponin elevation and creatinine of 1.56. She underwent stress testing revealing anterior scar with minimal ischemia and severe LV dysfunction. Echo showed EF of 50-55% however. CT of the chest showed no PE. She was started on beta blocker, ARB, Lasix, and hydralazine and was discharged home.  On March 15, she developed recurrent chest pain radiating to her neck, associated with nausea, vomiting, and dyspnea. She presented to the [facility] emergency department where she had non-ST elevation MI peak troponin 3.22.She underwent diagnostic catheterization on March 16 revealing a chronic total occlusion of the LAD with moderate left circumflex disease and otherwise minor irregularities. Fractional flow reserve was performed within the left circumflex and was normal at 0.92. Medical therapy was recommended. EF was 35% and right heart catheterization revealed pulmonary hypertension and elevated filling pressures with a pulmonary capillary wedge pressure of 22. She was treated with 1 dose of IV Lasix and this was subsequently transitioned to oral Lasix. We also titrated beta blocker therapy while adding ACE inhibitor and weaning off her previous home dose of clonidine. Following gentle diuresis, she has remained euvolemic and her weight is 141 pounds this morning. Echocardiogram showed an EF 35-40% with grade 1  diastolic dysfunction. Of note, TSH was found to be mildly suppressed @ 3.22, however T3 and FT4 were wnl.  Patient comes in today accompanied by her sister and brother. She has had no further chest pain, palpitations, dizziness or presyncope. She has chronic dyspnea on exertion and her blood pressure is elevated today. She says she is planning to go back to [place] with her son on Saturday. It is over 400 mile trip to her sons and then she has another 400 mile trip to her own home where she would live alone. Her sister is very concerned that she won't be a little take care of herself. She is very weak and walks with a walker. Her sister is doing everything for her now. She says she is willing to take care of her and this is where she would live if she does not go back to her home. Patient would then follow-up with [place] here in [place]. She has tentatively made a visit with the cardiologist in [place] next month.    Past Medical History  Diagnosis Date  . Chronic systolic CHF (congestive heart failure) ([place])   . Hypertensive heart disease   . CAD (coronary artery disease)     a. Remote MI - never sought treatment when it occurred, shows up old on EKG;  b.   . COPD (chronic obstructive pulmonary disease) ([place])     worked in a [employer]  . CKD (chronic kidney disease), stage III   . Ischemic cardiomyopathy     Past Surgical History  Procedure Laterality Date  . Shoulder surgery    . Cardiac catheterization N/A 10/30/2015    Procedure: Right/Left Heart  Cath and Coronary Angiography;  Surgeon: Wellington Hampshire, MD;  Location: [facility];  Service: Cardiovascular;  Laterality: N/A;  . Cardiac catheterization N/A 10/30/2015    Procedure: Intravascular Pressure Wire/FFR Study;  Surgeon: Wellington Hampshire, MD;  Location: [facility];  Service: Cardiovascular;  Laterality: N/A;     Current Outpatient Prescriptions  Medication Sig Dispense Refill  . aspirin EC 81 MG tablet Take 81 mg by  mouth daily.    Marland Kitchen atorvastatin (LIPITOR) 80 MG tablet Take 1 tablet (80 mg total) by mouth daily at 6 PM. 30 tablet 3  . carvedilol (COREG) 6.25 MG tablet Take 1 tablet (6.25 mg total) by mouth 2 (two) times daily with a meal. 60 tablet 3  . clopidogrel (PLAVIX) 75 MG tablet Take 1 tablet (75 mg total) by mouth daily. 30 tablet 3  . furosemide (LASIX) 20 MG tablet Take 1 tablet (20 mg total) by mouth daily. 30 tablet 3  . gabapentin (NEURONTIN) 100 MG capsule Take 200 mg by mouth 3 (three) times daily.    Marland Kitchen HYDROcodone-acetaminophen (NORCO/VICODIN) 5-325 MG tablet Take 1 tablet by mouth every 6 (six) hours as needed for moderate pain.    Marland Kitchen lisinopril (PRINIVIL,ZESTRIL) 5 MG tablet Take 1 tablet (5 mg total) by mouth daily. 30 tablet 3  . nitroGLYCERIN (NITROSTAT) 0.4 MG SL tablet Place 1 tablet (0.4 mg total) under the tongue every 5 (five) minutes as needed for chest pain. 25 tablet 3  . Omega-3 Fatty Acids (FISH OIL) 1000 MG CAPS Take 1,000 mg by mouth daily.    . pantoprazole (PROTONIX) 40 MG tablet Take 1 tablet (40 mg total) by mouth daily. 30 tablet 3  . ranitidine (ZANTAC) 75 MG tablet Take 75 mg by mouth 2 (two) times daily.    . sertraline (ZOLOFT) 100 MG tablet Take 100 mg by mouth at bedtime.     . traMADol (ULTRAM) 50 MG tablet Take 50 mg by mouth every 12 (twelve) hours as needed for moderate pain.      No current facility-administered medications for this visit.    Allergies:   Review of patient's allergies indicates no known allergies.    Social History:  The patient  reports that she has never smoked. She does not have any smokeless tobacco history on file. She reports that she does not drink alcohol.   Family History:  The patient's    family history includes CAD in her brother and sister; Cancer in her mother.    ROS:  Please see the history of present illness.   Otherwise, review of systems are positive for excessive fatigue balance issues walks with a cane muscle aches  and pains easy bruising.   All other systems are reviewed and negative.    PHYSICAL EXAM: VS:  BP 168/74 mmHg  Pulse 70  Ht 5\' 1"  (1.549 m)  Wt 147 lb 3.2 oz (66.769 kg)  BMI 27.83 kg/m2 , BMI Body mass index is 27.83 kg/(m^2). GEN: Well nourished, well developed, in no acute distress Neck: no JVD, HJR, carotid bruits, or masses Cardiac: RRR; 2/6 systolic murmur at the left sternal border no,gallop, rubs, thrill or heave,  Respiratory:  clear to auscultation bilaterally, normal work of breathing GI: soft, nontender, nondistended, + BS MS: no deformity or atrophy Extremities: Right arm at cath site without hematoma or hemorrhage good radial and brachial pulses without cyanosis, clubbing, edema, good distal pulses bilaterally.  Skin: warm and dry, no rash Neuro:  Strength and sensation are intact    EKG:  EKG is ordered today. The ekg ordered today demonstrates normal sinus rhythm with first-degree AV block nonspecific ST-T wave changes old septal infarct Recent Labs: 10/30/2015: ALT 27; B Natriuretic Peptide 1649.7*; Magnesium 2.2; TSH 0.345* 11/02/2015: BUN 19; Creatinine, Ser 1.17*; Hemoglobin 11.9*; Platelets 206; Potassium 3.7; Sodium 142    Lipid Panel    Component Value Date/Time   CHOL 186 10/30/2015 0855   TRIG 139 10/30/2015 0855   HDL 40* 10/30/2015 0855   CHOLHDL 4.7 10/30/2015 0855   VLDL 28 10/30/2015 0855   LDLCALC 118* 10/30/2015 0855      Wt Readings from Last 3 Encounters:  11/10/15 147 lb 3.2 oz (66.769 kg)  11/02/15 141 lb 12.1 oz (64.3 kg)      Other studies Reviewed: Additional studies/ records that were reviewed today include and review of the records demonstrates:  Right and Left Heart Cardiac Catheterization 3.16.2016    Coronary Findings       Dominance: Right      Left Main   . Vessel is angiographically normal.     .  Ost LM to LM lesion, 20% stenosed.         Left Anterior Descending     .  Prox LAD lesion, 100% stenosed.     .  First  Diagonal Branch     .  Ost 1st Diag to 1st Diag lesion, 80% stenosed.         Left Circumflex     .  Ost Cx lesion, 50% stenosed.     .  Prox Cx lesion, 60% stenosed.    **FFR normal at 0.92  Med Rx.     .  First Obtuse Marginal Branch     The vessel is small in size and exhibits minimal luminal irregularities.     .  Second Obtuse Marginal Branch     The vessel is small in size and is angiographically normal.     .  Third Obtuse Marginal Branch     The vessel is large in size and is angiographically normal.         Right Coronary Artery   . Vessel is angiographically normal.     .  Right Posterior Descending Artery     The vessel exhibits minimal luminal irregularities.     .  Right Posterior Atrioventricular Branch     The vessel exhibits minimal luminal irregularities.     .  First Right Posterolateral     The vessel is angiographically normal.     .  Second Right Posterolateral     The vessel is angiographically normal.             Right Heart Pressures Hemodynamic findings consistent with mild pulmonary hypertension. Elevated LV EDP consistent with volume overload.   Left Ventricle The left ventricle is enlarged. There is moderate to severe left ventricular systolic dysfunction. The ejection fraction is calculated to be 35%. Ejection fraction quantitative: 35%. There are wall motion abnormalities in the left ventricle. There are segmental wall motion abnormalities in the left ventricle.   Aortic Valve There is no aortic valve stenosis.    Fick Cardiac Output    3.87 L/min        Fick Cardiac Output Index    2.37 (L/min)/BSA      RA A Wave    9 mmHg      RA V Wave    6  mmHg      RA Mean    5 mmHg      RV Systolic Pressure    42 mmHg      RV Diastolic Pressure    2 mmHg      RV EDP    7 mmHg      PA Systolic Pressure    41 mmHg      PA Diastolic Pressure    16 mmHg      PA Mean    25 mmHg      PW A Wave    34 mmHg      PW V Wave    21 mmHg      PW Mean    22 mmHg       QP/QS    1      TPVR Index    10.51 HRUI      TSVR Index    49.23 HRUI      PVR SVR Ratio    0.03      TPVR/TSVR Ratio    0.21   _____________    2D Echocardiogram 3.17.2017  Study Conclusions   - Left ventricle: The cavity size was normal. There was mild focal   basal hypertrophy of the septum. Systolic function was moderately   reduced. The estimated ejection fraction was in the range of 35%   to 40%. There is akinesis of the mid-apicalanteroseptal   myocardium. There is akinesis of the apical myocardium. Doppler   parameters are consistent with abnormal left ventricular   relaxation (grade 1 diastolic dysfunction). - Aortic valve: There was trivial regurgitation.   Impressions:   - Mid/distal anteroseptal and apical akinesis with overall moderate   LV dysfunction; no apical thrombus using definity; grade 1   diastolic dysfunction; trace AI and TR.   ASSESSMENT AND PLAN: CAD (coronary artery disease) Patient had recent non-ST elevation MI with cath revealing total LAD with moderate left circumflex and otherwise minor irregularities. EF 35%. She also had right heart catheterization revealing pulmonary hypertension and elevated filling pressures. Patient has had no recurrent chest pain. Her blood pressure is elevated today. Recommend increase lisinopril to 10 mg daily. She is considering staying here in [place] with her sister and would then follow-up with Dr. Radford Pax. We'll make arrangements. We'll check labs today.  Hypertensive heart disease The pressure is elevated. Increase lisinopril to 10 mg daily. Check renal function.  Cardiomyopathy, ischemic EF 35%. No evidence of heart failure on exam.  PAH (pulmonary artery hypertension) ([place]) Continue to follow. Increase lisinopril.  CKD (chronic kidney disease), stage III Check renal function today.  Hyperlipidemia Will need fasting lipid panel and LFTs in 6 weeks.    Sumner Boast, PA-C  11/10/2015 11:51 AM     [facility], [place], [place]  [address] Phone: [phone]; Fax: [phone]

## 2015-11-10 NOTE — Assessment & Plan Note (Signed)
Continue to follow. Increase lisinopril.

## 2015-11-11 ENCOUNTER — Encounter: Payer: Medicare HMO | Admitting: Cardiology

## 2015-11-13 ENCOUNTER — Other Ambulatory Visit: Payer: Self-pay | Admitting: *Deleted

## 2015-11-13 MED ORDER — FUROSEMIDE 20 MG PO TABS: 20.0000 mg | ORAL_TABLET | Freq: Every day | ORAL | Status: DC

## 2015-11-13 MED ORDER — CARVEDILOL 6.25 MG PO TABS: 6.2500 mg | ORAL_TABLET | Freq: Two times a day (BID) | ORAL | Status: DC

## 2015-11-13 MED ORDER — NITROGLYCERIN 0.4 MG SL SUBL: 0.4000 mg | SUBLINGUAL_TABLET | SUBLINGUAL | Status: DC | PRN

## 2015-11-13 MED ORDER — ATORVASTATIN CALCIUM 80 MG PO TABS: 80.0000 mg | ORAL_TABLET | Freq: Every day | ORAL | Status: DC

## 2015-11-13 MED ORDER — CLOPIDOGREL BISULFATE 75 MG PO TABS: 75.0000 mg | ORAL_TABLET | Freq: Every day | ORAL | Status: DC

## 2015-11-14 ENCOUNTER — Emergency Department: Payer: Medicare HMO

## 2015-11-14 ENCOUNTER — Other Ambulatory Visit: Payer: Self-pay | Admitting: *Deleted

## 2015-11-14 ENCOUNTER — Inpatient Hospital Stay
Admission: EM | Admit: 2015-11-14 | Discharge: 2015-11-17 | DRG: 682 | Disposition: A | Discharge status: Home Health | Payer: Medicare HMO | Attending: Internal Medicine | Admitting: Internal Medicine

## 2015-11-14 ENCOUNTER — Encounter: Payer: Self-pay | Admitting: Emergency Medicine

## 2015-11-14 DIAGNOSIS — E875 Hyperkalemia: Secondary | ICD-10-CM | POA: Diagnosis present

## 2015-11-14 DIAGNOSIS — N183 Chronic kidney disease, stage 3 (moderate): Secondary | ICD-10-CM | POA: Diagnosis not present

## 2015-11-14 DIAGNOSIS — Z7982 Long term (current) use of aspirin: Secondary | ICD-10-CM | POA: Diagnosis not present

## 2015-11-14 DIAGNOSIS — Z79899 Other long term (current) drug therapy: Secondary | ICD-10-CM

## 2015-11-14 DIAGNOSIS — J449 Chronic obstructive pulmonary disease, unspecified: Secondary | ICD-10-CM | POA: Diagnosis present

## 2015-11-14 DIAGNOSIS — M25559 Pain in unspecified hip: Secondary | ICD-10-CM

## 2015-11-14 DIAGNOSIS — W010XXA Fall on same level from slipping, tripping and stumbling without subsequent striking against object, initial encounter: Secondary | ICD-10-CM | POA: Diagnosis not present

## 2015-11-14 DIAGNOSIS — Z7902 Long term (current) use of antithrombotics/antiplatelets: Secondary | ICD-10-CM

## 2015-11-14 DIAGNOSIS — I255 Ischemic cardiomyopathy: Secondary | ICD-10-CM | POA: Diagnosis present

## 2015-11-14 DIAGNOSIS — Z96641 Presence of right artificial hip joint: Secondary | ICD-10-CM | POA: Diagnosis present

## 2015-11-14 DIAGNOSIS — I2510 Atherosclerotic heart disease of native coronary artery without angina pectoris: Secondary | ICD-10-CM | POA: Diagnosis present

## 2015-11-14 DIAGNOSIS — M25551 Pain in right hip: Secondary | ICD-10-CM | POA: Diagnosis present

## 2015-11-14 DIAGNOSIS — I5042 Chronic combined systolic (congestive) and diastolic (congestive) heart failure: Secondary | ICD-10-CM | POA: Diagnosis not present

## 2015-11-14 DIAGNOSIS — I130 Hypertensive heart and chronic kidney disease with heart failure and stage 1 through stage 4 chronic kidney disease, or unspecified chronic kidney disease: Secondary | ICD-10-CM | POA: Diagnosis not present

## 2015-11-14 DIAGNOSIS — R7989 Other specified abnormal findings of blood chemistry: Secondary | ICD-10-CM

## 2015-11-14 DIAGNOSIS — E860 Dehydration: Secondary | ICD-10-CM | POA: Diagnosis present

## 2015-11-14 DIAGNOSIS — I214 Non-ST elevation (NSTEMI) myocardial infarction: Secondary | ICD-10-CM | POA: Diagnosis present

## 2015-11-14 DIAGNOSIS — N179 Acute kidney failure, unspecified: Principal | ICD-10-CM | POA: Diagnosis present

## 2015-11-14 DIAGNOSIS — Z8249 Family history of ischemic heart disease and other diseases of the circulatory system: Secondary | ICD-10-CM

## 2015-11-14 DIAGNOSIS — Z471 Aftercare following joint replacement surgery: Secondary | ICD-10-CM | POA: Diagnosis not present

## 2015-11-14 DIAGNOSIS — S76219A Strain of adductor muscle, fascia and tendon of unspecified thigh, initial encounter: Secondary | ICD-10-CM

## 2015-11-14 DIAGNOSIS — E876 Hypokalemia: Secondary | ICD-10-CM | POA: Diagnosis not present

## 2015-11-14 DIAGNOSIS — N189 Chronic kidney disease, unspecified: Secondary | ICD-10-CM | POA: Diagnosis not present

## 2015-11-14 DIAGNOSIS — T502X5A Adverse effect of carbonic-anhydrase inhibitors, benzothiadiazides and other diuretics, initial encounter: Secondary | ICD-10-CM | POA: Diagnosis present

## 2015-11-14 DIAGNOSIS — D631 Anemia in chronic kidney disease: Secondary | ICD-10-CM | POA: Diagnosis not present

## 2015-11-14 DIAGNOSIS — W19XXXA Unspecified fall, initial encounter: Secondary | ICD-10-CM

## 2015-11-14 DIAGNOSIS — R778 Other specified abnormalities of plasma proteins: Secondary | ICD-10-CM

## 2015-11-14 DIAGNOSIS — Y92009 Unspecified place in unspecified non-institutional (private) residence as the place of occurrence of the external cause: Secondary | ICD-10-CM | POA: Diagnosis not present

## 2015-11-14 HISTORY — DX: Chronic combined systolic (congestive) and diastolic (congestive) heart failure: I50.42

## 2015-11-14 HISTORY — DX: Atherosclerotic heart disease of native coronary artery without angina pectoris: I25.10

## 2015-11-14 LAB — COMPREHENSIVE METABOLIC PANEL
ALK PHOS: 57 U/L (ref 38–126)
ALT: 16 U/L (ref 14–54)
ANION GAP: 4 — AB (ref 5–15)
AST: 28 U/L (ref 15–41)
Albumin: 3.4 g/dL — ABNORMAL LOW (ref 3.5–5.0)
BUN: 36 mg/dL — ABNORMAL HIGH (ref 6–20)
CALCIUM: 8.5 mg/dL — AB (ref 8.9–10.3)
CO2: 27 mmol/L (ref 22–32)
Chloride: 100 mmol/L — ABNORMAL LOW (ref 101–111)
Creatinine, Ser: 2.05 mg/dL — ABNORMAL HIGH (ref 0.44–1.00)
GFR calc non Af Amer: 21 mL/min — ABNORMAL LOW (ref >60)
GFR, EST AFRICAN AMERICAN: 25 mL/min — AB (ref >60)
Glucose, Bld: 109 mg/dL — ABNORMAL HIGH (ref 65–99)
POTASSIUM: 5.3 mmol/L — AB (ref 3.5–5.1)
SODIUM: 131 mmol/L — AB (ref 135–145)
Total Bilirubin: 0.5 mg/dL (ref 0.3–1.2)
Total Protein: 6.1 g/dL — ABNORMAL LOW (ref 6.5–8.1)

## 2015-11-14 LAB — URINALYSIS COMPLETE WITH MICROSCOPIC (ARMC ONLY)
BILIRUBIN URINE: NEGATIVE
Bacteria, UA: NONE SEEN
GLUCOSE, UA: NEGATIVE mg/dL
Hgb urine dipstick: NEGATIVE
Ketones, ur: NEGATIVE mg/dL
Leukocytes, UA: NEGATIVE
NITRITE: NEGATIVE
Protein, ur: NEGATIVE mg/dL
SPECIFIC GRAVITY, URINE: 1.005 (ref 1.005–1.030)
pH: 5 (ref 5.0–8.0)

## 2015-11-14 LAB — CBC WITH DIFFERENTIAL/PLATELET
Basophils Absolute: 0 10*3/uL (ref 0–0.1)
Basophils Relative: 0 %
EOS ABS: 0.4 10*3/uL (ref 0–0.7)
EOS PCT: 5 %
HCT: 29.3 % — ABNORMAL LOW (ref 35.0–47.0)
Hemoglobin: 9.7 g/dL — ABNORMAL LOW (ref 12.0–16.0)
LYMPHS ABS: 1.4 10*3/uL (ref 1.0–3.6)
Lymphocytes Relative: 16 %
MCH: 31.2 pg (ref 26.0–34.0)
MCHC: 33.1 g/dL (ref 32.0–36.0)
MCV: 94.3 fL (ref 80.0–100.0)
MONO ABS: 1 10*3/uL — AB (ref 0.2–0.9)
MONOS PCT: 11 %
Neutro Abs: 6.2 10*3/uL (ref 1.4–6.5)
Neutrophils Relative %: 68 %
PLATELETS: 171 10*3/uL (ref 150–440)
RBC: 3.1 MIL/uL — ABNORMAL LOW (ref 3.80–5.20)
RDW: 13.8 % (ref 11.5–14.5)
WBC: 9.1 10*3/uL (ref 3.6–11.0)

## 2015-11-14 LAB — TROPONIN I
Troponin I: 0.53 ng/mL — ABNORMAL HIGH (ref <0.031)
Troponin I: 0.57 ng/mL — ABNORMAL HIGH (ref <0.031)

## 2015-11-14 MED ORDER — TRAMADOL HCL 50 MG PO TABS: 50.0000 mg | ORAL_TABLET | Freq: Two times a day (BID) | ORAL | Status: DC | PRN

## 2015-11-14 MED ORDER — SODIUM CHLORIDE 0.9 % IV SOLN
Freq: Once | INTRAVENOUS | Status: AC
  Administered 2015-11-14: 1000 mL via INTRAVENOUS

## 2015-11-14 MED ORDER — SODIUM CHLORIDE 0.9 % IV SOLN
INTRAVENOUS | Status: AC
  Administered 2015-11-14 – 2015-11-15 (×2): via INTRAVENOUS

## 2015-11-14 MED ORDER — ACETAMINOPHEN 650 MG RE SUPP: 650.0000 mg | Freq: Four times a day (QID) | RECTAL | Status: DC | PRN

## 2015-11-14 MED ORDER — FUROSEMIDE 20 MG PO TABS: 20.0000 mg | ORAL_TABLET | Freq: Every day | ORAL | Status: DC

## 2015-11-14 MED ORDER — CARVEDILOL 6.25 MG PO TABS: 6.2500 mg | ORAL_TABLET | Freq: Two times a day (BID) | ORAL | Status: DC

## 2015-11-14 MED ORDER — ACETAMINOPHEN 325 MG PO TABS
650.0000 mg | ORAL_TABLET | Freq: Four times a day (QID) | ORAL | Status: DC | PRN
  Administered 2015-11-14 – 2015-11-15 (×2): 650 mg via ORAL
  Filled 2015-11-14: qty 2

## 2015-11-14 MED ORDER — ATORVASTATIN CALCIUM 80 MG PO TABS: 80.0000 mg | ORAL_TABLET | Freq: Every day | ORAL | Status: DC

## 2015-11-14 MED ORDER — SODIUM POLYSTYRENE SULFONATE 15 GM/60ML PO SUSP
15.0000 g | Freq: Once | ORAL | Status: AC
  Administered 2015-11-14: 15 g via ORAL
  Filled 2015-11-14: qty 60

## 2015-11-14 MED ORDER — CLOPIDOGREL BISULFATE 75 MG PO TABS
75.0000 mg | ORAL_TABLET | Freq: Every day | ORAL | Status: DC
Start: 2015-11-14 — End: 2016-02-25

## 2015-11-14 MED ORDER — ACETAMINOPHEN 325 MG PO TABS
ORAL_TABLET | ORAL | Status: AC
  Administered 2015-11-14: 650 mg via ORAL
  Filled 2015-11-14: qty 2

## 2015-11-14 MED ORDER — NITROGLYCERIN 0.4 MG SL SUBL: 0.4000 mg | SUBLINGUAL_TABLET | SUBLINGUAL | Status: AC | PRN

## 2015-11-14 NOTE — H&P (Signed)
[facility] NAME: Katherine [name]    MR#:  [id number]  DATE OF BIRTH:  1930-03-29  DATE OF ADMISSION:  11/14/2015  PRIMARY CARE PHYSICIAN: No PCP Per Patient   REQUESTING/REFERRING PHYSICIAN: Dr. Jimmye Norman  CHIEF COMPLAINT:   Fall HISTORY OF PRESENT ILLNESS:  Katherine [name]  is a 82 y.o. female with a known history of Non-STEMI who was just recently admitted to the hospital and discharged with non-STEMI, during the second week March status post cardiac catheterization on medical management is brought into the ED after she sustained a fall at home. Patient actually lives in [place] and visiting her sister in [place] and this is her third admission during her visit. Hip x-rays did not reveal any fractures but patient is dehydrated and renal function is worse. Patient is also found to be hypotensive with troponin at 0.53 which was at 3.22 on March 16. Patient denies any chest pain or shortness of breath at this time  PAST MEDICAL HISTORY:   Past Medical History  Diagnosis Date  . Chronic systolic CHF (congestive heart failure) ([place])   . Hypertensive heart disease   . CAD (coronary artery disease)     a. Remote MI - never sought treatment when it occurred, shows up old on EKG;  b.   . COPD (chronic obstructive pulmonary disease) ([place])     worked in a [employer]  . CKD (chronic kidney disease), stage III   . Ischemic cardiomyopathy     PAST SURGICAL HISTOIRY:   Past Surgical History  Procedure Laterality Date  . Shoulder surgery    . Cardiac catheterization N/A 10/30/2015    Procedure: Right/Left Heart Cath and Coronary Angiography;  Surgeon: Wellington Hampshire, MD;  Location: [facility];  Service: Cardiovascular;  Laterality: N/A;  . Cardiac catheterization N/A 10/30/2015    Procedure: Intravascular Pressure Wire/FFR Study;  Surgeon: Wellington Hampshire, MD;  Location: [facility];  Service: Cardiovascular;  Laterality: N/A;     SOCIAL HISTORY:   Social History  Substance Use Topics  . Smoking status: Never Smoker   . Smokeless tobacco: Not on file  . Alcohol Use: No    FAMILY HISTORY:   Family History  Problem Relation Age of Onset  . Cancer Mother   . CAD Sister   . CAD Brother     DRUG ALLERGIES:  No Known Allergies  REVIEW OF SYSTEMS:  CONSTITUTIONAL: No fever, fatigue or weakness.  EYES: No blurred or double vision.  EARS, NOSE, AND THROAT: No tinnitus or ear pain.  RESPIRATORY: No cough, shortness of breath, wheezing or hemoptysis.  CARDIOVASCULAR: No chest pain, orthopnea, edema.  GASTROINTESTINAL: No nausea, vomiting, diarrhea or abdominal pain.  GENITOURINARY: No dysuria, hematuria.  ENDOCRINE: No polyuria, nocturia,  HEMATOLOGY: No anemia, easy bruising or bleeding SKIN: No rash or lesion. MUSCULOSKELETAL: Right hip pain with bruising otherwise No joint pain or arthritis.   NEUROLOGIC: No tingling, numbness, weakness.  PSYCHIATRY: No anxiety or depression.   MEDICATIONS AT HOME:   Prior to Admission medications   Medication Sig Start Date End Date Taking? Authorizing Provider  aspirin EC 81 MG tablet Take 81 mg by mouth daily.    Historical Provider, MD  atorvastatin (LIPITOR) 80 MG tablet Take 1 tablet (80 mg total) by mouth daily at 6 PM. 11/14/15   Imogene Burn, PA-C  carvedilol (COREG) 6.25 MG tablet Take 1 tablet (6.25 mg total) by  mouth 2 (two) times daily with a meal. 11/14/15   Imogene Burn, PA-C  clopidogrel (PLAVIX) 75 MG tablet Take 1 tablet (75 mg total) by mouth daily. 11/14/15   Imogene Burn, PA-C  furosemide (LASIX) 20 MG tablet Take 1 tablet (20 mg total) by mouth daily. 11/14/15   Imogene Burn, PA-C  gabapentin (NEURONTIN) 100 MG capsule Take 200 mg by mouth 3 (three) times daily.    Historical Provider, MD  HYDROcodone-acetaminophen (NORCO/VICODIN) 5-325 MG tablet Take 1 tablet by mouth every 6 (six) hours as needed for moderate pain.    Historical  Provider, MD  lisinopril (PRINIVIL,ZESTRIL) 10 MG tablet Take 1 tablet (10 mg total) by mouth daily. 11/10/15   Imogene Burn, PA-C  nitroGLYCERIN (NITROSTAT) 0.4 MG SL tablet Place 1 tablet (0.4 mg total) under the tongue every 5 (five) minutes as needed for chest pain. 11/14/15   Imogene Burn, PA-C  Omega-3 Fatty Acids (FISH OIL) 1000 MG CAPS Take 1,000 mg by mouth daily.    Historical Provider, MD  pantoprazole (PROTONIX) 40 MG tablet Take 1 tablet (40 mg total) by mouth daily. 11/02/15   Rogelia Mire, NP  ranitidine (ZANTAC) 75 MG tablet Take 75 mg by mouth 2 (two) times daily.    Historical Provider, MD  sertraline (ZOLOFT) 100 MG tablet Take 100 mg by mouth at bedtime.     Historical Provider, MD  traMADol (ULTRAM) 50 MG tablet Take 50 mg by mouth every 12 (twelve) hours as needed for moderate pain.     Historical Provider, MD      VITAL SIGNS:  Blood pressure 106/57, pulse 74, temperature 98.2 F (36.8 C), temperature source Oral, resp. rate 7, weight 70.8 kg (156 lb 1.4 oz), SpO2 96 %.  PHYSICAL EXAMINATION:  GENERAL:  82 y.o.-year-old patient lying in the bed with no acute distress.  EYES: Pupils equal, round, reactive to light and accommodation. No scleral icterus. Extraocular muscles intact.  HEENT: Head atraumatic, normocephalic. Oropharynx and nasopharynx clear. Dry mucous membranes NECK:  Supple, no jugular venous distention. No thyroid enlargement, no tenderness.  LUNGS: Normal breath sounds bilaterally, no wheezing, rales,rhonchi or crepitation. No use of accessory muscles of respiration.  CARDIOVASCULAR: S1, S2 normal. No murmurs, rubs, or gallops.  ABDOMEN: Soft, nontender, nondistended. Bowel sounds present. No organomegaly or mass.  EXTREMITIES: Right hip area is tender with bruises, range of motion is limited No pedal edema, cyanosis, or clubbing.  NEUROLOGIC: Cranial nerves II through XII are intact. Muscle strength 5/5 in all extremities. Sensation intact. Gait  not checked.  PSYCHIATRIC: The patient is alert and oriented x 3.  SKIN: No obvious rash, lesion, or ulcer.   LABORATORY PANEL:   CBC  Recent Labs Lab 11/14/15 1335  WBC 9.1  HGB 9.7*  HCT 29.3*  PLT 171   ------------------------------------------------------------------------------------------------------------------  Chemistries   Recent Labs Lab 11/14/15 1335  NA 131*  K 5.3*  CL 100*  CO2 27  GLUCOSE 109*  BUN 36*  CREATININE 2.05*  CALCIUM 8.5*  AST 28  ALT 16  ALKPHOS 57  BILITOT 0.5   ------------------------------------------------------------------------------------------------------------------  Cardiac Enzymes  Recent Labs Lab 11/14/15 1335  TROPONINI 0.53*   ------------------------------------------------------------------------------------------------------------------  RADIOLOGY:  Dg Femur, Min 2 Views Right  11/14/2015  CLINICAL DATA:  Fall in bathroom today. Right hip pain. Pain in right hip with movement. EXAM: RIGHT FEMUR 2 VIEWS COMPARISON:  None. FINDINGS: A right total hip arthroplasty is noted. The prosthesis is  located. There is no fracture associated. Adjacent soft tissue swelling is noted. The visualized pelvis is unremarkable. IMPRESSION: 1. Right total hip arthroplasty without radiographic evidence for complication. 2. No acute abnormality of the right femur. Electronically Signed   By: San Morelle M.D.   On: 11/14/2015 15:01    EKG:   Orders placed or performed during the hospital encounter of 11/14/15  . EKG 12-Lead  . EKG 12-Lead  . ED EKG  . ED EKG    IMPRESSION AND PLAN:   Katherine [name]  is a 82 y.o. female with a known history of Non-STEMI who was just recently admitted to the hospital and discharged with non-STEMI, during the second week March status post cardiac catheterization on medical management is brought into the ED after she sustained a fall at home.   #1. Acute kidney injury from dehydration from  diuretics Probably prerenal from dehydration as patient is hypotensive at this time  Renal function is worse with creatinine at 2.1 which was at 1.0, 4 days ago Fluid bolus was given in the ED we'll continue gentle hydration with IV fluids with close monitoring for symptoms and signs of fluid overload. patient is not fluid overloaded at this time Monitor renal function closely  #2 hyperkalemia Discontinue lisinopril at this time Will give Kayexalate and check BMP in a.m.  #3 recent history of non-STEMI status post cardiac catheterization Currently patient is asymptomatic Troponin is at 0.53 which has been trended down significantly as it was at 3.22 on March 16  Continue home medication aspirin, Imdur, statin  #4 history of systolic congestive heart failure with ischemic cardiomyopathy Holding Lasix at this time in view of hypotension and acute kidney injury Monitor daily weights and intake and output Cardiology consult is placed to Dr. Donivan Scull group  #5 fall with right hip pain This is most likely from dehydration Hip x-ray with no fractures. Pain management as needed  DVT prophylaxis with Lovenox subcutaneous   All the records are reviewed and case discussed with ED provider. Management plans discussed with the patient, family and they are in agreement. Greater than 50% time was spent on face-to-face education, counseling and coordination of care  CODE STATUS: Full code, son Mr. Felton Clinton is the healthcare power of attorney-his phone number [phone]  TOTAL TIME TAKING CARE OF THIS PATIENT:45 minutes.    Nicholes Mango M.D on 11/14/2015 at 4:19 PM  Between 7am to 6pm - Pager - [phone]  After 6pm go to [email or web address] - password [email or web address] [facility]  [facility]  Office  [phone]  CC: Primary care physician; No PCP Per Patient

## 2015-11-14 NOTE — ED Provider Notes (Signed)
[facility] Emergency Department Provider Note     Time seen: ----------------------------------------- 1:36 PM on 11/14/2015 -----------------------------------------    I have reviewed the triage vital signs and the nursing notes.   HISTORY  Chief Complaint Hip Pain    HPI Katherine [name] is a 82 y.o. female who presents to ER after a fall at home. Patient was right hip pain, she states she was cleaning her sister's toilet after recently having been discharged from the hospital after having a heart attack. Patient states she was slippery she slipped and fell, landing on her right hip. Complains of severe right hip pain whenever she moves it. On arrival her blood pressure was noted to be low. She denies any recent illness.   Past Medical History  Diagnosis Date  . Chronic systolic CHF (congestive heart failure) ([place])   . Hypertensive heart disease   . CAD (coronary artery disease)     a. Remote MI - never sought treatment when it occurred, shows up old on EKG;  b.   . COPD (chronic obstructive pulmonary disease) ([place])     worked in a [employer]  . CKD (chronic kidney disease), stage III   . Ischemic cardiomyopathy     Patient Active Problem List   Diagnosis Date Noted  . CAD (coronary artery disease) 11/02/2015  . CKD (chronic kidney disease), stage III 11/02/2015  . Hypertensive heart disease 11/02/2015  . Hyperlipidemia 11/02/2015  . Cardiomyopathy, ischemic 11/02/2015  . Chronic systolic CHF (congestive heart failure) ([place]) 11/02/2015  . PAH (pulmonary artery hypertension) ([place]) 11/02/2015  . NSTEMI (non-ST elevated myocardial infarction) ([place]) 10/30/2015  . COPD (chronic obstructive pulmonary disease) ([place])   . Chronic kidney disease     Past Surgical History  Procedure Laterality Date  . Shoulder surgery    . Cardiac catheterization N/A 10/30/2015    Procedure: Right/Left Heart Cath and Coronary Angiography;  Surgeon: Wellington Hampshire, MD;   Location: [place] [facility];  Service: Cardiovascular;  Laterality: N/A;  . Cardiac catheterization N/A 10/30/2015    Procedure: Intravascular Pressure Wire/FFR Study;  Surgeon: Wellington Hampshire, MD;  Location: [place] [facility];  Service: Cardiovascular;  Laterality: N/A;    Allergies Review of patient's allergies indicates no known allergies.  Social History Social History  Substance Use Topics  . Smoking status: Never Smoker   . Smokeless tobacco: None  . Alcohol Use: No    Review of Systems Constitutional: Negative for fever. Eyes: Negative for visual changes. ENT: Negative for sore throat. Cardiovascular: Negative for chest pain. Respiratory: Negative for shortness of breath. Gastrointestinal: Negative for abdominal pain, vomiting and diarrhea. Genitourinary: Negative for dysuria. Musculoskeletal: Positive for right hip pain Skin: Negative for rash. Neurological: Negative for headaches, focal weakness or numbness.  10-point ROS otherwise negative.  ____________________________________________   PHYSICAL EXAM:  VITAL SIGNS: ED Triage Vitals  Enc Vitals Group     BP 11/14/15 1319 68/45 mmHg     Pulse Rate 11/14/15 1319 49     Resp 11/14/15 1319 18     Temp 11/14/15 1319 98.2 F (36.8 C)     Temp Source 11/14/15 1319 Oral     SpO2 11/14/15 1319 96 %     Weight 11/14/15 1319 156 lb 1.4 oz (70.8 kg)     Height --      Head Cir --      Peak Flow --      Pain Score 11/14/15 1318 10  Pain Loc --      Pain Edu? --      Excl. in [place]? --     Constitutional: Alert and oriented. Well appearing and in no distress. Eyes: Conjunctivae are normal. PERRL. Normal extraocular movements. ENT   Head: Normocephalic and atraumatic.   Nose: No congestion/rhinnorhea.   Mouth/Throat: Mucous membranes are Dry   Neck: No stridor. Cardiovascular: Normal rate, regular rhythm. Normal and symmetric distal pulses are present in all extremities. No murmurs, rubs, or  gallops. Respiratory: Normal respiratory effort without tachypnea nor retractions. Breath sounds are clear and equal bilaterally. No wheezes/rales/rhonchi. Gastrointestinal: Soft and nontender. No distention. No abdominal bruits.  Musculoskeletal: Significant pain with range of motion of the right hip, femur is also tender to touch Neurologic:  Normal speech and language. No gross focal neurologic deficits are appreciated. Gait cannot be assessed due to pain Skin:  Skin is warm, dry and intact. No rash noted. Psychiatric: Mood and affect are normal. Speech and behavior are normal. Patient exhibits appropriate insight and judgment. ____________________________________________  EKG: Interpreted by me. Sinus bradycardia with a rate of 44 bpm, first-degree AV block, normal QRS, normal QT interval. Normal axis. Septal infarct.  ____________________________________________  ED COURSE:  Pertinent labs & imaging results that were available during my care of the patient were reviewed by me and considered in my medical decision making (see chart for details). Patient is in no acute distress at this time but likely has a hip fracture. She is also hypotensive, this is possibly vagal response due to pain. ____________________________________________    LABS (pertinent positives/negatives)  Labs Reviewed  CBC WITH DIFFERENTIAL/PLATELET - Abnormal; Notable for the following:    RBC 3.10 (*)    Hemoglobin 9.7 (*)    HCT 29.3 (*)    Monocytes Absolute 1.0 (*)    All other components within normal limits  COMPREHENSIVE METABOLIC PANEL - Abnormal; Notable for the following:    Sodium 131 (*)    Potassium 5.3 (*)    Chloride 100 (*)    Glucose, Bld 109 (*)    BUN 36 (*)    Creatinine, Ser 2.05 (*)    Calcium 8.5 (*)    Total Protein 6.1 (*)    Albumin 3.4 (*)    GFR calc non Af Amer 21 (*)    GFR calc Af Amer 25 (*)    Anion gap 4 (*)    All other components within normal limits  TROPONIN I -  Abnormal; Notable for the following:    Troponin I 0.53 (*)    All other components within normal limits  URINALYSIS COMPLETEWITH MICROSCOPIC ([facility] ONLY)  CBG MONITORING, ED    RADIOLOGY Images were viewed by me  Femur x-rays IMPRESSION: 1. Right total hip arthroplasty without radiographic evidence for complication. 2. No acute abnormality of the right femur.  ____________________________________________  FINAL ASSESSMENT AND PLAN  Fall, groin strain, Acute renal failure, elevated troponin level  Plan: Patient with labs and imaging as dictated above. Patient presents after a fall at home. She appears dehydrated clinically, this is likely medication related. She is currently improved after saline infusion. X-rays are negative for fracture, will recommend observation to ensure improvement in her electrolytes and renal function.   Earleen Newport, MD   Earleen Newport, MD 11/14/15 [phone]

## 2015-11-14 NOTE — ED Notes (Signed)
Patient has had several BM's since receiving kayexelate medication. Patient able to pivot to bedside commode. Blood pressure is greatly improved since arrival.

## 2015-11-14 NOTE — ED Notes (Signed)
Patient arrives POV with c/o right hip pain s/p fall. Patient states her "right leg just gets weak"

## 2015-11-15 ENCOUNTER — Inpatient Hospital Stay: Payer: Medicare HMO

## 2015-11-15 ENCOUNTER — Encounter: Payer: Self-pay | Admitting: Cardiology

## 2015-11-15 DIAGNOSIS — I214 Non-ST elevation (NSTEMI) myocardial infarction: Secondary | ICD-10-CM

## 2015-11-15 DIAGNOSIS — D631 Anemia in chronic kidney disease: Secondary | ICD-10-CM

## 2015-11-15 DIAGNOSIS — I2510 Atherosclerotic heart disease of native coronary artery without angina pectoris: Secondary | ICD-10-CM | POA: Diagnosis present

## 2015-11-15 DIAGNOSIS — N179 Acute kidney failure, unspecified: Principal | ICD-10-CM

## 2015-11-15 DIAGNOSIS — N183 Chronic kidney disease, stage 3 (moderate): Secondary | ICD-10-CM

## 2015-11-15 DIAGNOSIS — N189 Chronic kidney disease, unspecified: Secondary | ICD-10-CM

## 2015-11-15 DIAGNOSIS — I5042 Chronic combined systolic (congestive) and diastolic (congestive) heart failure: Secondary | ICD-10-CM | POA: Diagnosis present

## 2015-11-15 DIAGNOSIS — I255 Ischemic cardiomyopathy: Secondary | ICD-10-CM

## 2015-11-15 LAB — BASIC METABOLIC PANEL
Anion gap: 0 — ABNORMAL LOW (ref 5–15)
BUN: 21 mg/dL — AB (ref 6–20)
CHLORIDE: 120 mmol/L — AB (ref 101–111)
CO2: 23 mmol/L (ref 22–32)
CREATININE: 0.89 mg/dL (ref 0.44–1.00)
Calcium: 6.6 mg/dL — ABNORMAL LOW (ref 8.9–10.3)
GFR calc non Af Amer: 58 mL/min — ABNORMAL LOW (ref >60)
GLUCOSE: 82 mg/dL (ref 65–99)
Potassium: 3.2 mmol/L — ABNORMAL LOW (ref 3.5–5.1)
Sodium: 142 mmol/L (ref 135–145)

## 2015-11-15 LAB — TROPONIN I: Troponin I: 0.6 ng/mL — ABNORMAL HIGH (ref <0.031)

## 2015-11-15 LAB — CBC
HCT: 27.1 % — ABNORMAL LOW (ref 35.0–47.0)
Hemoglobin: 8.9 g/dL — ABNORMAL LOW (ref 12.0–16.0)
MCH: 31.8 pg (ref 26.0–34.0)
MCHC: 32.9 g/dL (ref 32.0–36.0)
MCV: 96.7 fL (ref 80.0–100.0)
PLATELETS: 139 10*3/uL — AB (ref 150–440)
RBC: 2.81 MIL/uL — AB (ref 3.80–5.20)
RDW: 13.9 % (ref 11.5–14.5)
WBC: 6.1 10*3/uL (ref 3.6–11.0)

## 2015-11-15 MED ORDER — NITROGLYCERIN 0.4 MG SL SUBL: 0.4000 mg | SUBLINGUAL_TABLET | SUBLINGUAL | Status: DC | PRN

## 2015-11-15 MED ORDER — ONDANSETRON HCL 4 MG PO TABS: 4.0000 mg | ORAL_TABLET | Freq: Four times a day (QID) | ORAL | Status: DC | PRN

## 2015-11-15 MED ORDER — ENOXAPARIN SODIUM 40 MG/0.4ML ~~LOC~~ SOLN
30.0000 mg | SUBCUTANEOUS | Status: DC
  Administered 2015-11-15: 30 mg via SUBCUTANEOUS
  Filled 2015-11-15: qty 0.4

## 2015-11-15 MED ORDER — SERTRALINE HCL 100 MG PO TABS
100.0000 mg | ORAL_TABLET | Freq: Every day | ORAL | Status: DC
  Administered 2015-11-15 – 2015-11-16 (×2): 100 mg via ORAL
  Filled 2015-11-15 (×2): qty 1

## 2015-11-15 MED ORDER — ATORVASTATIN CALCIUM 20 MG PO TABS
80.0000 mg | ORAL_TABLET | Freq: Every day | ORAL | Status: DC
  Administered 2015-11-15 – 2015-11-16 (×2): 80 mg via ORAL
  Filled 2015-11-15 (×2): qty 4

## 2015-11-15 MED ORDER — DOCUSATE SODIUM 100 MG PO CAPS
100.0000 mg | ORAL_CAPSULE | Freq: Two times a day (BID) | ORAL | Status: DC
  Administered 2015-11-15 – 2015-11-17 (×5): 100 mg via ORAL
  Filled 2015-11-15 (×5): qty 1

## 2015-11-15 MED ORDER — OMEGA-3-ACID ETHYL ESTERS 1 G PO CAPS
1.0000 g | ORAL_CAPSULE | Freq: Every day | ORAL | Status: DC
Start: 2015-11-15 — End: 2015-11-17
  Administered 2015-11-15 – 2015-11-17 (×3): 1 g via ORAL
  Filled 2015-11-15 (×3): qty 1

## 2015-11-15 MED ORDER — CLOPIDOGREL BISULFATE 75 MG PO TABS
75.0000 mg | ORAL_TABLET | Freq: Every day | ORAL | Status: DC
  Administered 2015-11-15 – 2015-11-17 (×3): 75 mg via ORAL
  Filled 2015-11-15 (×3): qty 1

## 2015-11-15 MED ORDER — ASPIRIN EC 81 MG PO TBEC
81.0000 mg | DELAYED_RELEASE_TABLET | Freq: Every day | ORAL | Status: DC
  Administered 2015-11-15 – 2015-11-17 (×3): 81 mg via ORAL
  Filled 2015-11-15 (×3): qty 1

## 2015-11-15 MED ORDER — HYDROCODONE-ACETAMINOPHEN 5-325 MG PO TABS
0.5000 | ORAL_TABLET | Freq: Every day | ORAL | Status: DC | PRN
  Filled 2015-11-15: qty 1

## 2015-11-15 MED ORDER — ENOXAPARIN SODIUM 40 MG/0.4ML ~~LOC~~ SOLN
40.0000 mg | SUBCUTANEOUS | Status: DC
  Administered 2015-11-15 – 2015-11-16 (×2): 40 mg via SUBCUTANEOUS
  Filled 2015-11-15 (×2): qty 0.4

## 2015-11-15 MED ORDER — FUROSEMIDE 20 MG PO TABS
20.0000 mg | ORAL_TABLET | Freq: Every day | ORAL | Status: DC
  Administered 2015-11-15 – 2015-11-17 (×3): 20 mg via ORAL
  Filled 2015-11-15 (×3): qty 1

## 2015-11-15 MED ORDER — GABAPENTIN 100 MG PO CAPS
200.0000 mg | ORAL_CAPSULE | Freq: Three times a day (TID) | ORAL | Status: DC
  Administered 2015-11-15 – 2015-11-17 (×7): 200 mg via ORAL
  Filled 2015-11-15 (×7): qty 2

## 2015-11-15 MED ORDER — PANTOPRAZOLE SODIUM 40 MG PO TBEC
40.0000 mg | DELAYED_RELEASE_TABLET | Freq: Every day | ORAL | Status: DC
  Administered 2015-11-15 – 2015-11-17 (×3): 40 mg via ORAL
  Filled 2015-11-15 (×3): qty 1

## 2015-11-15 MED ORDER — POTASSIUM CHLORIDE CRYS ER 20 MEQ PO TBCR
40.0000 meq | EXTENDED_RELEASE_TABLET | Freq: Once | ORAL | Status: AC
  Administered 2015-11-15: 40 meq via ORAL
  Filled 2015-11-15: qty 2

## 2015-11-15 MED ORDER — ENOXAPARIN SODIUM 100 MG/ML ~~LOC~~ SOLN
SUBCUTANEOUS | Status: AC
  Filled 2015-11-15: qty 1

## 2015-11-15 MED ORDER — FAMOTIDINE 20 MG PO TABS
20.0000 mg | ORAL_TABLET | Freq: Every day | ORAL | Status: DC
Start: 2015-11-15 — End: 2015-11-17
  Administered 2015-11-15 – 2015-11-17 (×3): 20 mg via ORAL
  Filled 2015-11-15 (×3): qty 1

## 2015-11-15 MED ORDER — ONDANSETRON HCL 4 MG/2ML IJ SOLN: 4.0000 mg | Freq: Four times a day (QID) | INTRAMUSCULAR | Status: DC | PRN

## 2015-11-15 MED ORDER — HYDROCODONE-ACETAMINOPHEN 5-325 MG PO TABS
1.0000 | ORAL_TABLET | Freq: Four times a day (QID) | ORAL | Status: DC | PRN
  Administered 2015-11-15 – 2015-11-17 (×6): 1 via ORAL
  Filled 2015-11-15 (×5): qty 1

## 2015-11-15 MED ORDER — LISINOPRIL 10 MG PO TABS
10.0000 mg | ORAL_TABLET | Freq: Every day | ORAL | Status: DC
  Administered 2015-11-15 – 2015-11-16 (×2): 10 mg via ORAL
  Filled 2015-11-15 (×2): qty 1

## 2015-11-15 MED ORDER — SODIUM CHLORIDE 0.9% FLUSH
3.0000 mL | Freq: Two times a day (BID) | INTRAVENOUS | Status: DC
  Administered 2015-11-15 – 2015-11-17 (×5): 3 mL via INTRAVENOUS

## 2015-11-15 MED ORDER — CARVEDILOL 6.25 MG PO TABS
6.2500 mg | ORAL_TABLET | Freq: Two times a day (BID) | ORAL | Status: DC
  Administered 2015-11-15 – 2015-11-16 (×3): 6.25 mg via ORAL
  Filled 2015-11-15 (×3): qty 1

## 2015-11-15 NOTE — ED Notes (Signed)
Ed tech assisted pt to bedside commode for continent void.

## 2015-11-15 NOTE — Progress Notes (Signed)
PT Cancellation Note  Patient Details Name: Katherine [name] MRN: [id number] DOB: 20-Sep-1929   Cancelled Treatment:    Reason Eval/Treat Not Completed: Medical issues which prohibited therapy Chart reviewed, patient with recent NSTEMI, had fall at home and presents today with rising troponins. She has a cardiology consult pending. Will hold mobility evaluation until cardiology provides further management recommendations.   Kerman Passey, PT, DPT    11/15/2015, 12:54 PM

## 2015-11-15 NOTE — Progress Notes (Signed)
Anticoagulation Policy-Lovenox Adjustment  Patient is an 82 yo female admitted for acute kidney injury.  Patient currently ordered Lovenox 30 mg subq q24h.  Renal function improved overnight and est CrCl is now 43.1 mL/min.  Therefore, will transition patient to Lovenox 40 mg subq q24h based on CrCl>30 mL/min per anticoagulation policy.  Last dose of Lovenox 30 mg administered on 4/1 ~0000.  Will order next dose of Lovenox 40 to start tonight at 2100.  Pharmacy will continue to monitor per policy.  Murrell [place], PharmD Clinical Pharmacist 11/15/2015

## 2015-11-15 NOTE — Progress Notes (Signed)
On the assessment pt has only four fingers on the left hand due to an work injury

## 2015-11-15 NOTE — Progress Notes (Addendum)
[place] at [place] NAME: Katherine [name]    MR#:  [id number]  DATE OF BIRTH:  Nov 30, 1929  SUBJECTIVE:    c/o right hip pain after fall a few days ago  REVIEW OF SYSTEMS:    Review of Systems  Constitutional: Negative for fever, chills and malaise/fatigue.  HENT: Negative for ear discharge, ear pain, hearing loss, nosebleeds and sore throat.   Eyes: Negative for blurred vision and pain.  Respiratory: Negative for cough, hemoptysis, shortness of breath and wheezing.   Cardiovascular: Negative for chest pain, palpitations and leg swelling.  Gastrointestinal: Negative for nausea, vomiting, abdominal pain, diarrhea and blood in stool.  Genitourinary: Negative for dysuria.  Musculoskeletal: Positive for joint pain. Negative for back pain.  Neurological: Negative for dizziness, tremors, speech change, focal weakness, seizures and headaches.  Endo/Heme/Allergies: Does not bruise/bleed easily.  Psychiatric/Behavioral: Negative for depression, suicidal ideas and hallucinations.    Tolerating Diet:yes      DRUG ALLERGIES:  No Known Allergies  VITALS:  Blood pressure 154/57, pulse 64, temperature 97.9 F (36.6 C), temperature source Oral, resp. rate 16, height 5\' 1"  (1.549 m), weight 67.813 kg (149 lb 8 oz), SpO2 100 %.  PHYSICAL EXAMINATION:   Physical Exam  Constitutional: She is oriented to person, place, and time and well-developed, well-nourished, and in no distress. No distress.  HENT:  Head: Normocephalic.  Eyes: No scleral icterus.  Neck: Normal range of motion. Neck supple. No JVD present. No tracheal deviation present.  Cardiovascular: Normal rate, regular rhythm and normal heart sounds.  Exam reveals no gallop and no friction rub.   No murmur heard. Pulmonary/Chest: Effort normal and breath sounds normal. No respiratory distress. She has no wheezes. She has no rales. She exhibits no tenderness.  Abdominal: Soft. Bowel  sounds are normal. She exhibits no distension and no mass. There is no tenderness. There is no rebound and no guarding.  Musculoskeletal: Normal range of motion. She exhibits no edema.  Decreased ROM right hip pain aat hip joint  Neurological: She is alert and oriented to person, place, and time.  Skin: Skin is warm. No rash noted. No erythema.  Psychiatric: Affect and judgment normal.      LABORATORY PANEL:   CBC  Recent Labs Lab 11/15/15 0521  WBC 6.1  HGB 8.9*  HCT 27.1*  PLT 139*   ------------------------------------------------------------------------------------------------------------------  Chemistries   Recent Labs Lab 11/14/15 1335 11/15/15 0521  NA 131* 142  K 5.3* 3.2*  CL 100* 120*  CO2 27 23  GLUCOSE 109* 82  BUN 36* 21*  CREATININE 2.05* 0.89  CALCIUM 8.5* 6.6*  AST 28  --   ALT 16  --   ALKPHOS 57  --   BILITOT 0.5  --    ------------------------------------------------------------------------------------------------------------------  Cardiac Enzymes  Recent Labs Lab 11/14/15 1335 11/14/15 2306 11/15/15 0521  TROPONINI 0.53* 0.57* 0.60*   ------------------------------------------------------------------------------------------------------------------  RADIOLOGY:  Ct Hip Right Wo Contrast  11/15/2015  CLINICAL DATA:  [place] yesterday at home and injured right hip. History of right hip arthroplasty. EXAM: CT OF THE RIGHT HIP WITHOUT CONTRAST TECHNIQUE: Multidetector CT imaging of the right hip was performed according to the standard protocol. Multiplanar CT image reconstructions were also generated. COMPARISON:  Radiographs 11/14/2015 FINDINGS: Moderate artifact associated with the right total hip arthroplasty. The acetabular and femoral components appear well seated. No complicating features such as loosening or periprosthetic fracture are identified. The visualized right bony pelvis appears intact.  No definite pubic rami fractures. No obvious  intermuscular hematoma is identified. No significant intrapelvic abnormalities. IMPRESSION: Intact right total hip arthroplasty. No complicating features such as loosening or periprosthetic fracture. Electronically Signed   By: Marijo Sanes M.D.   On: 11/15/2015 11:41   Dg Femur, Min 2 Views Right  11/14/2015  CLINICAL DATA:  Fall in bathroom today. Right hip pain. Pain in right hip with movement. EXAM: RIGHT FEMUR 2 VIEWS COMPARISON:  None. FINDINGS: A right total hip arthroplasty is noted. The prosthesis is located. There is no fracture associated. Adjacent soft tissue swelling is noted. The visualized pelvis is unremarkable. IMPRESSION: 1. Right total hip arthroplasty without radiographic evidence for complication. 2. No acute abnormality of the right femur. Electronically Signed   By: San Morelle M.D.   On: 11/14/2015 15:01     ASSESSMENT AND PLAN:   82 year old female with recent non-STEMI and cardiac catheterization revealing CTO of the LAD and nonobs LCX disease who presents after fall and found to have elevated troponin and acute renal failure.  1. Acute renal failure: Creatinine has much improved with IV fluids and holding nephrotoxic agents.  2. Hypokalemia: Potassium will be repleted.  3. Right hip pain: CT scan does not show evidence of loosening or periprosthetic fracture. Continue supportive management and physical therapy consult.  4. ASCVD with elevation in troponin: Elevation in troponin is likely due to demand ischemia and poor renal clearance. Follow-up in cardiology consultation.   medical management was advised during last hospitalization. Continue aspirin, atorvastatin, Plavix. Restart Coreg and lisinopril. Watch heart rate while on Coreg.   5. Ischemic cardiomyopathy with ejection fraction of 35-40%: Continue Coreg and lisinopril.   continue Lasix. Monitor BMP.   Management plans discussed with the patient and she is in agreement.  CODE STATUS:  FULL  TOTAL TIME TAKING CARE OF THIS PATIENT: 32 minutes.     POSSIBLE D/C 1-2 days, DEPENDING ON CLINICAL CONDITION.   Angela Platner M.D on 11/15/2015 at 12:07 PM  Between 7am to 6pm - Pager - [phone] After 6pm go to [email or web address] - password [email or web address] [facility]  [place]  Office  [phone]  CC: Primary care physician; No PCP Per Patient  Note: This dictation was prepared with Dragon dictation along with smaller phrase technology. Any transcriptional errors that result from this process are unintentional.

## 2015-11-15 NOTE — ED Notes (Signed)
Report received from [facility]. Will attempt to call report to floor. Bed status pending ready. Will call to check status.

## 2015-11-15 NOTE — ED Notes (Addendum)
Called floor to ask status of assigned bed.  Secretary reported she would talk to charge RN about bed status

## 2015-11-16 LAB — BASIC METABOLIC PANEL
ANION GAP: 4 — AB (ref 5–15)
BUN: 15 mg/dL (ref 6–20)
CALCIUM: 8.4 mg/dL — AB (ref 8.9–10.3)
CO2: 24 mmol/L (ref 22–32)
CREATININE: 0.91 mg/dL (ref 0.44–1.00)
Chloride: 112 mmol/L — ABNORMAL HIGH (ref 101–111)
GFR, EST NON AFRICAN AMERICAN: 57 mL/min — AB (ref >60)
Glucose, Bld: 108 mg/dL — ABNORMAL HIGH (ref 65–99)
Potassium: 4 mmol/L (ref 3.5–5.1)
SODIUM: 140 mmol/L (ref 135–145)

## 2015-11-16 MED ORDER — AMLODIPINE BESYLATE 5 MG PO TABS
5.0000 mg | ORAL_TABLET | Freq: Every day | ORAL | Status: DC
  Administered 2015-11-16: 5 mg via ORAL
  Filled 2015-11-16: qty 1

## 2015-11-16 MED ORDER — HYDRALAZINE HCL 20 MG/ML IJ SOLN
10.0000 mg | Freq: Once | INTRAMUSCULAR | Status: AC
  Administered 2015-11-16: 10 mg via INTRAVENOUS
  Filled 2015-11-16: qty 1

## 2015-11-16 MED ORDER — CARVEDILOL 12.5 MG PO TABS: 12.5000 mg | ORAL_TABLET | Freq: Two times a day (BID) | ORAL | Status: DC

## 2015-11-16 MED ORDER — LISINOPRIL 20 MG PO TABS
40.0000 mg | ORAL_TABLET | Freq: Every day | ORAL | Status: DC
  Administered 2015-11-17: 40 mg via ORAL
  Filled 2015-11-16: qty 2

## 2015-11-16 MED ORDER — CARVEDILOL 6.25 MG PO TABS
6.2500 mg | ORAL_TABLET | Freq: Two times a day (BID) | ORAL | Status: DC
  Administered 2015-11-17: 6.25 mg via ORAL
  Filled 2015-11-16: qty 1

## 2015-11-16 NOTE — Progress Notes (Addendum)
[place] at [place] NAME: Katherine [name]    MR#:  [id number]  DATE OF BIRTH:  [birth year]  SUBJECTIVE:  Doing fairly well this am No chest pain still with cough  REVIEW OF SYSTEMS:    Review of Systems  Constitutional: Negative for fever, chills and malaise/fatigue.  HENT: Negative for ear discharge, ear pain, hearing loss, nosebleeds and sore throat.   Eyes: Negative for blurred vision and pain.  Respiratory: Negative for cough, hemoptysis, shortness of breath and wheezing.   Cardiovascular: Negative for chest pain, palpitations and leg swelling.  Gastrointestinal: Negative for nausea, vomiting, abdominal pain, diarrhea and blood in stool.  Genitourinary: Negative for dysuria.  Musculoskeletal: Positive for joint pain. Negative for back pain.  Neurological: Negative for dizziness, tremors, speech change, focal weakness, seizures and headaches.  Endo/Heme/Allergies: Does not bruise/bleed easily.  Psychiatric/Behavioral: Negative for depression, suicidal ideas and hallucinations.    Tolerating Diet:yes      DRUG ALLERGIES:  No Known Allergies  VITALS:  Blood pressure 170/63, pulse 63, temperature 98.3 F (36.8 C), temperature source Oral, resp. rate 16, height 5\' 1"  (1.549 m), weight 67.949 kg (149 lb 12.8 oz), SpO2 100 %.  PHYSICAL EXAMINATION:   Physical Exam  Constitutional: She is oriented to person, place, and time and well-developed, well-nourished, and in no distress. No distress.  HENT:  Head: Normocephalic.  Eyes: No scleral icterus.  Neck: Normal range of motion. Neck supple. No JVD present. No tracheal deviation present.  Cardiovascular: Normal rate, regular rhythm and normal heart sounds.  Exam reveals no gallop and no friction rub.   No murmur heard. Pulmonary/Chest: Effort normal and breath sounds normal. No respiratory distress. She has no wheezes. She has no rales. She exhibits no tenderness.  Abdominal:  Soft. Bowel sounds are normal. She exhibits no distension and no mass. There is no tenderness. There is no rebound and no guarding.  Musculoskeletal: Normal range of motion. She exhibits no edema.  Able to move right hip but painful, no swelling or bruising noted.  Neurological: She is alert and oriented to person, place, and time.  Skin: Skin is warm. No rash noted. No erythema.  Psychiatric: Affect and judgment normal.      LABORATORY PANEL:   CBC  Recent Labs Lab 11/15/15 0521  WBC 6.1  HGB 8.9*  HCT 27.1*  PLT 139*   ------------------------------------------------------------------------------------------------------------------  Chemistries   Recent Labs Lab 11/14/15 1335  11/16/15 0527  NA 131*  < > 140  K 5.3*  < > 4.0  CL 100*  < > 112*  CO2 27  < > 24  GLUCOSE 109*  < > 108*  BUN 36*  < > 15  CREATININE 2.05*  < > 0.91  CALCIUM 8.5*  < > 8.4*  AST 28  --   --   ALT 16  --   --   ALKPHOS 57  --   --   BILITOT 0.5  --   --   < > = values in this interval not displayed. ------------------------------------------------------------------------------------------------------------------  Cardiac Enzymes  Recent Labs Lab 11/14/15 1335 11/14/15 2306 11/15/15 0521  TROPONINI 0.53* 0.57* 0.60*   ------------------------------------------------------------------------------------------------------------------  RADIOLOGY:  Ct Hip Right Wo Contrast  11/15/2015  CLINICAL DATA:  [place] yesterday at home and injured right hip. History of right hip arthroplasty. EXAM: CT OF THE RIGHT HIP WITHOUT CONTRAST TECHNIQUE: Multidetector CT imaging of the right hip was performed according to the standard  protocol. Multiplanar CT image reconstructions were also generated. COMPARISON:  Radiographs 11/14/2015 FINDINGS: Moderate artifact associated with the right total hip arthroplasty. The acetabular and femoral components appear well seated. No complicating features such as  loosening or periprosthetic fracture are identified. The visualized right bony pelvis appears intact. No definite pubic rami fractures. No obvious intermuscular hematoma is identified. No significant intrapelvic abnormalities. IMPRESSION: Intact right total hip arthroplasty. No complicating features such as loosening or periprosthetic fracture. Electronically Signed   By: Marijo Sanes M.D.   On: 11/15/2015 11:41   Dg Femur, Min 2 Views Right  11/14/2015  CLINICAL DATA:  Fall in bathroom today. Right hip pain. Pain in right hip with movement. EXAM: RIGHT FEMUR 2 VIEWS COMPARISON:  None. FINDINGS: A right total hip arthroplasty is noted. The prosthesis is located. There is no fracture associated. Adjacent soft tissue swelling is noted. The visualized pelvis is unremarkable. IMPRESSION: 1. Right total hip arthroplasty without radiographic evidence for complication. 2. No acute abnormality of the right femur. Electronically Signed   By: San Morelle M.D.   On: 11/14/2015 15:01     ASSESSMENT AND PLAN:   82 year old female with recent non-STEMI and cardiac catheterization revealing CTO of the LAD and nonobs LCX disease who presents after fall and found to have elevated troponin and acute renal failure  1. Acute renal fialure: Creatinine normalized after IV fluids and holding nephrotoxic agents. Patient is back on lisinopril and Lasix and creatinine remains within normal limits..  2. Hypokalemia: Potassium repleted 3. Right hip pain: CT scan does not show evidence of loosening or periprosthetic fracture. Continue supportive management and physical therapy consult pending.  4. ASCVD with elevation in troponin: Elevation in troponin is due to recent non-STEMI and exacerbated by acute renal failure. There is no suggestion of ACS. Medical management was advised during last hospitalization. Continue aspirin, atorvastatin, Plavix, Coreg and lisinopril.    5. Ischemic cardiomyopathy with ejection  fraction of 35-40%: Continue Coreg and lisinopril.   continue Lasix. Monitor BMP.  She needs CHF clinic outpatient follow-up   Management plans discussed with the patient and she is in agreement.  CODE STATUS: FULL  TOTAL TIME TAKING CARE OF THIS PATIENT: 28 minutes.   D/w Dr Ellyn Hack  POSSIBLE D/C today pending physical therapy consult.   Lassie Demorest M.D on 11/16/2015 at 11:07 AM  Between 7am to 6pm - Pager - [phone] After 6pm go to [email or web address] - password [email or web address] [facility]  [place] [facility]  Office  [phone]  CC: Primary care physician; No PCP Per Patient  Note: This dictation was prepared with Dragon dictation along with smaller phrase technology. Any transcriptional errors that result from this process are unintentional.

## 2015-11-16 NOTE — Consult Note (Signed)
CARDIOLOGY CONSULTATION NOTE.  NAME:  Katherine [name]   MRN: [id number] DOB:  January 25, 1930   ADMIT DATE: 11/14/2015  Reason for Consult: + Troponin level, recent MI  Requesting Physician: Gardiner Coins  Primary Cardiologist: Berneice Gandy  HPI: This is a 82 y.o. female with a past medical history significant for Recent MI & Dx of Mod-Severe Ischemic CM (3/17)to have CTO of LAD.  (BY her report, she has been treated for "Heart Failure" at least 3 times in the past" & has been on Lasix for LE Edema for a long time.  She has also had difficult to treat HTN for years.  Apparently, there was a remote h/o MI.   She was d/c'd on 3/19 on Carvedilol, ASA/Plavix, Lisinopril, Atorvastatin; Lasix 20 mg daily.  She was recovering @ her sister's home after falling in the bathroom - denies any LOC - just felt a bit "woozy" & lost her balance while cleaning the toilet.  She was noted to heave Acute Renal Failure - suggestive of dehydration & persistently elevated Troponin levels.  Cardiovascular ROS: positive for - dyspnea on exertion, edema and paroxysmal nocturnal dyspnea negative for - chest pain, irregular heartbeat, loss of consciousness, murmur, orthopnea, palpitations, rapid heart rate, shortness of breath or near-syncope/syncope, TIA/Amaurosis Fugax, claudication.   PMHx:  CARDIAC HISTORY: She is from [place] but visiting in the [place] area with her daughter currently. She was recently evaluated at the [facility] emergency room secondary to chest pain and shortness of breath with subsequent finding of mild troponin elevation and creatinine of 1.56. She underwent stress testing revealing anterior scar with minimal ischemia and severe LV dysfunction. Echo showed EF of 50-55% however.  This was not confirmed when she presented to [facility] with NSTEMI on 10/29/2015.    Echo:  10/31/2015 :LV EF: 35% - 40%  Study Conclusions  - Left ventricle: The cavity size was normal. There was mild  focal  basal hypertrophy of the septum. Systolic function was moderately reduced. The estimated ejection fraction was in the range of 35% to 40%. There is akinesis of the mid-apical-anteroseptal myocardium. There is akinesis of the apical myocardium. Doppler  parameters are consistent with abnormal left ventricular relaxation (grade 1 diastolic dysfunction). - Aortic valve: There was trivial regurgitation.  Impressions:  - Mid/distal anteroseptal and apical akinesis with overall moderate LV dysfunction; no apical thrombus using definity; grade 1 diastolic dysfunction; trace AI and TR.  Cath:  10/30/2015  Ost Cx lesion, 50% stenosed.  Prox Cx lesion, 60% stenosed. - FFR 0.92  Ost LM to LM lesion, 20% stenosed.  Prox LAD lesion, 100% stenosed.  Ost 1st Diag to 1st Diag lesion, 80% stenosed.  There is moderate to severe left ventricular systolic dysfunction. EF ~35% with moderately elevated filling pressures.  Giant V wavs on PCWP suggests MR; PCWP 22 mmHg  Fck CO/CI -  3.87/2.37   Continue medical therapy for coronary artery disease.  Past Medical History  Diagnosis Date  . Chronic combined systolic and diastolic heart failure (HCC)     EF 35-40%, Akinesis of mid-apicalanteroseptal wall & AK of apex, Gr 1DD.  Marland Kitchen Hypertensive heart disease   . Occlusive coronary artery disease requiring drug therapy     a. Remote MI - never sought treatment when it occurred, shows up old on EKG;  b. Cath 10/2015 - 100% pLAD CTO with 80% D1, prox Cx 60% - EF ~35%  . 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 Ischemic cardiomyopathy   . CKD (chronic kidney disease), stage III    Past Surgical History  Procedure Laterality Date  . Shoulder surgery    . Cardiac catheterization N/A 10/30/2015    Procedure: Right/Left Heart Cath and Coronary Angiography;  Surgeon: Wellington Hampshire, MD;  Location: [facility];  Service: Cardiovascular;  Laterality: N/A;  .  Cardiac catheterization N/A 10/30/2015    Procedure: Intravascular Pressure Wire/FFR Study;  Surgeon: Wellington Hampshire, MD;  Location: [facility];  Service: Cardiovascular;  Laterality: N/A;    FAMHx: Family History  Problem Relation Age of Onset  . Cancer Mother   . CAD Sister   . CAD Brother     SOCHx:  reports that she has never smoked. She does not have any smokeless tobacco history on file. She reports that she does not drink alcohol. Her drug history is not on file.  ALLERGIES: No Known Allergies  ROS: Was performed Review of Systems  Constitutional: Malaise/fatigue: has not felt back to normal since her MI.  HENT: Negative for nosebleeds.   Respiratory: Negative for cough and wheezing.   Cardiovascular: Negative for leg swelling.  Gastrointestinal: Negative for blood in stool and melena.  Genitourinary: Negative for hematuria.  Musculoskeletal: Positive for falls (fell prior to admission - was cleaning the toilet & fell between toilet & tub).  Neurological: Positive for dizziness. Negative for headaches.  Endo/Heme/Allergies: Bruises/bleeds easily.  All other systems reviewed and are negative.  HOME MEDICATIONS: Prescriptions prior to admission  Medication Sig Dispense Refill Last Dose  . aspirin EC 81 MG tablet Take 81 mg by mouth daily.   11/14/2015 at 0930  . atorvastatin (LIPITOR) 80 MG tablet Take 1 tablet (80 mg total) by mouth daily at 6 PM. 90 tablet 3 11/13/2015 at Unknown time  . carvedilol (COREG) 6.25 MG tablet Take 1 tablet (6.25 mg total) by mouth 2 (two) times daily with a meal. 180 tablet 3 11/14/2015 at 0930  . clopidogrel (PLAVIX) 75 MG tablet Take 1 tablet (75 mg total) by mouth daily. 30 tablet 3 11/14/2015 at 0930  . furosemide (LASIX) 20 MG tablet Take 1 tablet (20 mg total) by mouth daily. 90 tablet 3 11/14/2015 at 0930  . gabapentin (NEURONTIN) 100 MG capsule Take 200 mg by mouth 3 (three) times daily.   11/14/2015 at 0930  .  HYDROcodone-acetaminophen (NORCO/VICODIN) 5-325 MG tablet Take 0.5-1 tablets by mouth daily as needed for moderate pain.   prn at prn  . lisinopril (PRINIVIL,ZESTRIL) 10 MG tablet Take 10 mg by mouth daily.   11/14/2015 at 0930  . nitroGLYCERIN (NITROSTAT) 0.4 MG SL tablet Place 1 tablet (0.4 mg total) under the tongue every 5 (five) minutes as needed for chest pain. 25 tablet 3 prn at prn  . Omega-3 Fatty Acids (FISH OIL) 1000 MG CAPS Take 1,000 mg by mouth daily.   11/14/2015 at 0930  . pantoprazole (PROTONIX) 40 MG tablet Take 1 tablet (40 mg total) by mouth daily. 30 tablet 3 11/14/2015 at 0930  . ranitidine (ZANTAC) 75 MG tablet Take 75 mg by mouth 2 (two) times daily.   11/14/2015 at 0930  . sertraline (ZOLOFT) 100 MG tablet Take 100 mg by mouth at bedtime.    11/13/2015 at Unknown time  . traMADol (ULTRAM) 50 MG tablet Take 50 mg by mouth every 12 (twelve) hours as needed for moderate pain.    prn at prn  HOSPITAL MEDICATIONS: Scheduled Meds: . aspirin EC  81 mg Oral Daily  . atorvastatin  80 mg Oral q1800  . carvedilol  6.25 mg Oral BID WC  . clopidogrel  75 mg Oral Daily  . docusate sodium  100 mg Oral BID  . enoxaparin (LOVENOX) injection  40 mg Subcutaneous Q24H  . famotidine  20 mg Oral Daily  . furosemide  20 mg Oral Daily  . gabapentin  200 mg Oral TID  . lisinopril  10 mg Oral Daily  . omega-3 acid ethyl esters  1 g Oral Daily  . pantoprazole  40 mg Oral Daily  . sertraline  100 mg Oral QHS  . sodium chloride flush  3 mL Intravenous Q12H   Continuous Infusions:  PRN Meds:.acetaminophen **OR** acetaminophen, HYDROcodone-acetaminophen, HYDROcodone-acetaminophen, nitroGLYCERIN, ondansetron **OR** ondansetron (ZOFRAN) IV, traMADol   VITALS: Blood pressure 170/63, pulse 63, temperature 98.3 F (36.8 C), temperature source Oral, resp. rate 16, height 5\' 1"  (1.549 m), weight 149 lb 12.8 oz (67.949 kg), SpO2 100 %.  PHYSICAL EXAM: General appearance: alert, cooperative,  appears stated age, no distress and says that she feels fine.   Neck: no adenopathy, no carotid bruit and no JVD Lungs: Mild expiratory wheezing, but no rales or rhonchi. Heart: RRR, normal S1&S2, 1-2/5 HSM @ LLSB, non-displaced PMI Abdomen: soft, non-tender; bowel sounds normal; no masses,  no organomegaly Extremities: extremities normal, atraumatic, no cyanosis or edema Pulses: 2+ and symmetric Skin: Skin color, texture, turgor normal. No rashes or lesions Neurologic: Mental status: Alert, oriented, thought content appropriate, tends to talk in tangents Cranial nerves: normal  LABS: Results for orders placed or performed during the hospital encounter of 11/14/15 (from the past 24 hour(s))  Basic metabolic panel     Status: Abnormal   Collection Time: 11/16/15  5:27 AM  Result Value Ref Range   Sodium 140 135 - 145 mmol/L   Potassium 4.0 3.5 - 5.1 mmol/L   Chloride 112 (H) 101 - 111 mmol/L   CO2 24 22 - 32 mmol/L   Glucose, Bld 108 (H) 65 - 99 mg/dL   BUN 15 6 - 20 mg/dL   Creatinine, Ser 0.91 0.44 - 1.00 mg/dL   Calcium 8.4 (L) 8.9 - 10.3 mg/dL   GFR calc non Af Amer 57 (L) >60 mL/min   GFR calc Af Amer >60 >60 mL/min   Anion gap 4 (L) 5 - 15    IMAGING: Ct Hip Right Wo Contrast  11/15/2015  CLINICAL DATA:  [address] [address] yesterday at home and injured right hip. History of right hip arthroplasty. EXAM: CT OF THE RIGHT HIP WITHOUT CONTRAST TECHNIQUE: Multidetector CT imaging of the right hip was performed according to the standard protocol. Multiplanar CT image reconstructions were also generated. COMPARISON:  Radiographs 11/14/2015 FINDINGS: Moderate artifact associated with the right total hip arthroplasty. The acetabular and femoral components appear well seated. No complicating features such as loosening or periprosthetic fracture are identified. The visualized right bony pelvis appears intact. No definite pubic rami fractures. No obvious intermuscular hematoma is identified. No significant  intrapelvic abnormalities. IMPRESSION: Intact right total hip arthroplasty. No complicating features such as loosening or periprosthetic fracture. Electronically Signed   By: Marijo Sanes M.D.   On: 11/15/2015 11:41    IMPRESSION: Active Problems:   NSTEMI (non-ST elevated myocardial infarction) ([place]) - 10/30/2015   CKD (chronic kidney disease), stage III   Cardiomyopathy, ischemic   AKI (acute kidney injury) ([place])   Chronic combined systolic and diastolic  heart failure (HCC)   Occlusive coronary artery disease requiring drug therapy   Anemia associated with chronic renal failure Fall - I suspect that she has some orthostatic hypotension - some of her historical description would corroborate this. = this only serves to make treating her ischemic CM/combined HF more difficult.  RECOMMENDATION:  I suspect that the elevated troponin level is simply residual from her recent NSTEMI & exacerbated by ARF - no suggestion of recurrent MI Sx.    Would not anticoagulate  Hgb has dropped - may be dilutional -- consider stopping ASA  ARF - already improved Cr after Hydration.  She was only on PO Lasix 20 mg @ home - ? Maybe she was down for longer than originally suspected.  BP meds were temporarily held.  Would continue Carvedilol - consider lower dose of ACE-I to start  Encourage hydration.   HyperKalemia was aggressively treated    CAD/Ischemic CM/CHF -->   On statin, Carvedilol & ACE-I   Restart Low dose Lasix in AM - with PRN sliding scale for weight gain > 3 lb (take additional 20 mg)  Would benefit from CHF Clinic for educational purposes.    Anemia - no sign of bleed, ? Dilutional.  Would check anemia panel - if Iron deficient, stop ASA.  Multiple studies were reviewed taking ~ 15-25  Min & direct patient contact was at least 60 min. Greater than 50% time was spent on face-to-face education, counseling and coordination of care  Time Spent Directly with Patient: 60  minutes  Surya Folden, Leonie Green, M.D., M.S. Interventional Cardiologist   Pager # [phone] Phone # [phone] [address] [place], [place] [address]

## 2015-11-16 NOTE — Progress Notes (Signed)
PT saw patient and stated the patient was able to walk with her walker, but is not able to get out of the bed on her own. Patient is from [place] and only has her sister and brother that are both elderly here to help her. Her sister needs back surgery soon, so she cannot help her much. Patient has been admitted 3 times since visiting here. MD stated okay to hold off one more day on discharge, just to keep the patient moving today so she can work on mobility.

## 2015-11-16 NOTE — Discharge Summary (Addendum)
[facility] NAME: Katherine [name]    MR#:  [id number]  DATE OF BIRTH:  Sep 05, 1929  DATE OF ADMISSION:  11/14/2015 ADMITTING PHYSICIAN: Nicholes Mango, MD  DATE OF DISCHARGE:11/17/2015  PRIMARY CARE PHYSICIAN: No PCP Per Patient    ADMISSION DIAGNOSIS:  Elevated troponin I level [R79.89] Groin strain, initial encounter [id number] Fall, initial encounter [W19.XXXA] Acute renal failure, unspecified acute renal failure type ([place]) [N17.9]  DISCHARGE DIAGNOSIS:  Active Problems:   NSTEMI (non-ST elevated myocardial infarction) ([place]) - 10/30/2015   CKD (chronic kidney disease), stage III   Cardiomyopathy, ischemic   AKI (acute kidney injury) ([place])   Chronic combined systolic and diastolic heart failure (HCC)   Occlusive coronary artery disease requiring drug therapy   Anemia associated with chronic renal failure   SECONDARY DIAGNOSIS:   Past Medical History  Diagnosis Date  . Chronic combined systolic and diastolic heart failure (HCC)     EF 35-40%, Akinesis of mid-apicalanteroseptal wall & AK of apex, Gr 1DD.  Marland Kitchen Hypertensive heart disease   . Occlusive coronary artery disease requiring drug therapy     a. Remote MI - never sought treatment when it occurred, shows up old on EKG;  b. Cath 10/2015 - 100% pLAD CTO with 80% D1, prox Cx 60% - EF ~35%  . COPD (chronic obstructive pulmonary disease) ([place])     worked in a [employer]  . Ischemic cardiomyopathy   . CKD (chronic kidney disease), stage III     HOSPITAL COURSE:    82 year old female with recent non-STEMI and cardiac catheterization revealing CTO of the LAD and nonobs LCX disease who presents after fall and found to have elevated troponin and ARF.  1. Acute renal failure: Creatinine normalized after IV fluids and holding nephrotoxic agents. Patient is back on lisinopril and Lasix and creatinine remains within normal limits..  2. Hypokalemia: Potassium repleted 3. Right hip  pain: CT scan did not show evidence of loosening or periprosthetic fracture. Continue pain management and PT at home.  4. ASCVD with elevation in troponin: She was evaluated by cardiology while in the hospital.  Elevation in troponin is due to recent non-STEMI and exacerbated by acute renal failure. There is no suggestion of ACS. Medical management was advised during last hospitalization. She will continue aspirin, atorvastatin, Plavix, Coreg and lisinopril.   5. Ischemic cardiomyopathy with ejection fraction of 35-40%: Continue Coreg and lisinopril.  Continue Lasix. She will be referred to CHF clinic.  6. Essential HTN: Her blood pressure was elevated so I added Norvasc to her regimen. She needs outpatient follow up with her PCP   DISCHARGE CONDITIONS AND DIET:  Stable for discharge on cardiac diet  CONSULTS OBTAINED:  Treatment Team:  Leonie Man, MD  DRUG ALLERGIES:  No Known Allergies  DISCHARGE MEDICATIONS:   Current Discharge Medication List    START taking these medications   Details  amLODipine (NORVASC) 10 MG tablet Take 1 tablet (10 mg total) by mouth at bedtime. Qty: 30 tablet, Refills: 0      CONTINUE these medications which have CHANGED   Details  lisinopril (PRINIVIL,ZESTRIL) 40 MG tablet Take 1 tablet (40 mg total) by mouth daily. Qty: 30 tablet, Refills: 0      CONTINUE these medications which have NOT CHANGED   Details  aspirin EC 81 MG tablet Take 81 mg by mouth daily.    atorvastatin (LIPITOR) 80 MG tablet Take 1 tablet (80 mg total)  by mouth daily at 6 PM. Qty: 90 tablet, Refills: 3    carvedilol (COREG) 6.25 MG tablet Take 1 tablet (6.25 mg total) by mouth 2 (two) times daily with a meal. Qty: 180 tablet, Refills: 3    clopidogrel (PLAVIX) 75 MG tablet Take 1 tablet (75 mg total) by mouth daily. Qty: 30 tablet, Refills: 3    furosemide (LASIX) 20 MG tablet Take 1 tablet (20 mg total) by mouth daily. Qty: 90 tablet, Refills: 3     gabapentin (NEURONTIN) 100 MG capsule Take 200 mg by mouth 3 (three) times daily.    HYDROcodone-acetaminophen (NORCO/VICODIN) 5-325 MG tablet Take 0.5-1 tablets by mouth daily as needed for moderate pain.    nitroGLYCERIN (NITROSTAT) 0.4 MG SL tablet Place 1 tablet (0.4 mg total) under the tongue every 5 (five) minutes as needed for chest pain. Qty: 25 tablet, Refills: 3    Omega-3 Fatty Acids (FISH OIL) 1000 MG CAPS Take 1,000 mg by mouth daily.    pantoprazole (PROTONIX) 40 MG tablet Take 1 tablet (40 mg total) by mouth daily. Qty: 30 tablet, Refills: 3    ranitidine (ZANTAC) 75 MG tablet Take 75 mg by mouth 2 (two) times daily.    sertraline (ZOLOFT) 100 MG tablet Take 100 mg by mouth at bedtime.     traMADol (ULTRAM) 50 MG tablet Take 50 mg by mouth every 12 (twelve) hours as needed for moderate pain.               Today   CHIEF COMPLAINT:  Patient is doing fairly well this morning. Pain is controlled better in hip this am  VITAL SIGNS:  Blood pressure 173/74, pulse 72, temperature 98.4 F (36.9 C), temperature source Oral, resp. rate 16, height 5\' 1"  (1.549 m), weight 67.042 kg (147 lb 12.8 oz), SpO2 94 %.   REVIEW OF SYSTEMS:  Review of Systems  Constitutional: Negative for fever, chills and malaise/fatigue.  HENT: Negative for ear discharge, ear pain, hearing loss, nosebleeds and sore throat.   Eyes: Negative for blurred vision and pain.  Respiratory: Negative for cough, hemoptysis, shortness of breath and wheezing.   Cardiovascular: Negative for chest pain, palpitations and leg swelling.  Gastrointestinal: Negative for nausea, vomiting, abdominal pain, diarrhea and blood in stool.  Genitourinary: Negative for dysuria.  Musculoskeletal: Positive for joint pain. Negative for back pain.  Neurological: Negative for dizziness, tremors, speech change, focal weakness, seizures and headaches.  Endo/Heme/Allergies: Does not bruise/bleed easily.   Psychiatric/Behavioral: Negative for depression, suicidal ideas and hallucinations.     PHYSICAL EXAMINATION:  GENERAL:  82 y.o.-year-old patient lying in the bed with no acute distress.  NECK:  Supple, no jugular venous distention. No thyroid enlargement, no tenderness.  LUNGS: Normal breath sounds bilaterally, no wheezing, rales,rhonchi  No use of accessory muscles of respiration.  CARDIOVASCULAR: S1, S2 normal. No murmurs, rubs, or gallops.  ABDOMEN: Soft, non-tender, non-distended. Bowel sounds present. No organomegaly or mass.  EXTREMITIES: No pedal edema, cyanosis, or clubbing.  PSYCHIATRIC: The patient is alert and oriented x 3.  SKIN: No obvious rash, lesion, or ulcer.   DATA REVIEW:   CBC  Recent Labs Lab 11/15/15 0521  WBC 6.1  HGB 8.9*  HCT 27.1*  PLT 139*    Chemistries   Recent Labs Lab 11/14/15 1335  11/16/15 0527  NA 131*  < > 140  K 5.3*  < > 4.0  CL 100*  < > 112*  CO2 27  < > 24  GLUCOSE 109*  < > 108*  BUN 36*  < > 15  CREATININE 2.05*  < > 0.91  CALCIUM 8.5*  < > 8.4*  AST 28  --   --   ALT 16  --   --   ALKPHOS 57  --   --   BILITOT 0.5  --   --   < > = values in this interval not displayed.  Cardiac Enzymes  Recent Labs Lab 11/14/15 1335 11/14/15 2306 11/15/15 0521  TROPONINI 0.53* 0.57* 0.60*    Microbiology Results  @[email or web address] @  RADIOLOGY:  Ct Hip Right Wo Contrast  11/15/2015  CLINICAL DATA:  [address] [address] yesterday at home and injured right hip. History of right hip arthroplasty. EXAM: CT OF THE RIGHT HIP WITHOUT CONTRAST TECHNIQUE: Multidetector CT imaging of the right hip was performed according to the standard protocol. Multiplanar CT image reconstructions were also generated. COMPARISON:  Radiographs 11/14/2015 FINDINGS: Moderate artifact associated with the right total hip arthroplasty. The acetabular and femoral components appear well seated. No complicating features such as loosening or periprosthetic fracture are identified.  The visualized right bony pelvis appears intact. No definite pubic rami fractures. No obvious intermuscular hematoma is identified. No significant intrapelvic abnormalities. IMPRESSION: Intact right total hip arthroplasty. No complicating features such as loosening or periprosthetic fracture. Electronically Signed   By: Marijo Sanes M.D.   On: 11/15/2015 11:41      Management plans discussed with the patient and she is in agreement. Stable for discharge home with [facility]  Patient should follow up with CARDIOLOGY 1 week  CODE STATUS:     Code Status Orders        Start     Ordered   11/14/15 2252  Full code   Continuous     11/14/15 2252    Code Status History    Date Active Date Inactive Code Status Order ID Comments User Context   10/30/2015  2:09 AM 11/02/2015  5:18 PM Full Code [id number]  Sueanne Margarita, MD ED      TOTAL TIME TAKING CARE OF THIS PATIENT: 35 minutes.    Note: This dictation was prepared with Dragon dictation along with smaller phrase technology. Any transcriptional errors that result from this process are unintentional.  Analyah Mcconnon M.D on 11/17/2015 at 10:27 AM  Between 7am to 6pm - Pager - [phone] After 6pm go to [email or web address] - password [email or web address] [facility]  [place] [place] [facility]  Office  [phone]  CC: Primary care physician; No PCP Per Patient

## 2015-11-16 NOTE — Progress Notes (Signed)
Notified Dr. Benjie Karvonen about patient's hypertension. MD stated to give a one time dose of hydralzine IV and to change patient's lisinopril from 10mg  to 40mg  for tomorrow. Will continue to monitor.

## 2015-11-16 NOTE — Progress Notes (Signed)
Physical Therapy Evaluation Patient Details Name: Katherine [name] MRN: [id number] DOB: Apr 26, 1930 Today's Date: 11/16/2015   History of Present Illness  Pt. is an 82 year old female admitted to [facility] on 11/14/15 due to a fall at home. She lives in [place], but is here visiting her sister and had just recently been discharged from [facility] due to a history of non-STEMI and post cardia catheterization. She reports that she has always had issues with her R leg and had a THA in 05/2011. She reports that her sister does help her with her leg at night with getting into bed. She normally walks with a single point cane with a small tripod base.   Clinical Impression  Patient did require assistance to get out of bed as well as CGA for ambulation and transfers with her rolling walker. Patient may benefit from an additional PT treatment while at [facility] (to check for greater I with her RLE as well as with greater ambulation tolerance) and then continuation of PT for strength, mobility and safety with mobility tasks upon discharge. Patient did decrease her ambulation distance today (is normally a Hydrographic surveyor, per patient) and did need some assistance with her RLE to get out of bed. She has decreased RLE strength and hip pain with strength testing.  The importance of safety was reinforced with the patient today. The patient also has questions regarding her pain medication. RN was consulted following PT evaluation regarding mobility concerns as well as medication questions from patient.   Follow Up Recommendations Home health PT    Equipment Recommendations  Rolling walker with 5" wheels    Recommendations for Other Services       Precautions / Restrictions Precautions Precautions: Fall Restrictions Weight Bearing Restrictions: No      Mobility  Bed Mobility Overal bed mobility: Needs Assistance Bed Mobility: Supine to Sit     Supine to sit: Min assist (with RLE and to get into sitting position)      General bed mobility comments: per patient, she does normally have help to get into and out of bed  Transfers Overall transfer level: Needs assistance Equipment used: Rolling walker (2 wheeled) Transfers: Sit to/from Stand Sit to Stand: Min guard         General transfer comment: assisted out of bed as well as onto commode  Ambulation/Gait Ambulation/Gait assistance: Min guard Ambulation Distance (Feet): 72 Feet Assistive device: Rolling walker (2 wheeled) Gait Pattern/deviations: Antalgic (decreased gait speed, increased RLE ER)        Stairs            Wheelchair Mobility    Modified Rankin (Stroke Patients Only)       Balance                                             Pertinent Vitals/Pain Pain Assessment: 0-10 Pain Score: 6  Pain Intervention(s): Limited activity within patient's tolerance (patient had medication that decreased pain from a 10/10)    Home Living Family/patient expects to be discharged to:: Private residence Living Arrangements: Other relatives (sister who is preparing for back surgery) Available Help at Discharge: Family Type of Home: House       Home Layout: One level (with ramp to enter) Home Equipment: Walker - 2 wheels;Cane - single point      Prior Function Level of Independence: Independent  with assistive device(s)         Comments: Per patient, her sister does help with her RLE (getting into and out of bed, etc)     Hand Dominance        Extremity/Trunk Assessment   Upper Extremity Assessment: Overall WFL for tasks assessed             RLE Deficits / Details: hip flexion strength 4/5 (painful); knee flexion 4/5; knee extension 5/5; ROM is [facility] for tasks during evaluation LLE Deficits / Details: 5/5 strength in LLE: ROM is [facility] for tasks during evaluation     Communication   Communication: No difficulties  Cognition Arousal/Alertness: Awake/alert Behavior During Therapy: WFL for tasks  assessed/performed Overall Cognitive Status: Within Functional Limits for tasks assessed                      General Comments      Exercises        Assessment/Plan    PT Assessment Patient needs continued PT services  PT Diagnosis Difficulty walking;Abnormality of gait;Acute pain;Generalized weakness   PT Problem List Decreased strength;Decreased activity tolerance;Pain  PT Treatment Interventions Functional mobility training;Therapeutic activities;Therapeutic exercise;Patient/family education   PT Goals (Current goals can be found in the Care Plan section) Acute Rehab PT Goals Patient Stated Goal: Pt. reports that she feels that she is ready to go home; she does ask how often she is able to take her Vicodin PT Goal Formulation: With patient Time For Goal Achievement: 11/30/15 Potential to Achieve Goals: Good    Frequency Min 2X/week   Barriers to discharge        Co-evaluation               End of Session Equipment Utilized During Treatment: Gait belt Activity Tolerance: Patient limited by pain (patient did request to stop walking ) Patient left: with nursing/sitter in room (while patient was on the commode) Nurse Communication: Mobility status (discharge, assistance for RLE and pain medication)         Time: [id number] PT Time Calculation (min) (ACUTE ONLY): 21 min   Charges:   PT Evaluation $PT Eval Low Complexity: 1 Procedure     PT G Codes:        Bertram Denver, PT, DPT, CWCE 11/16/2015, 1:42 PM

## 2015-11-17 MED ORDER — LISINOPRIL 40 MG PO TABS: 40.0000 mg | ORAL_TABLET | Freq: Every day | ORAL | Status: DC

## 2015-11-17 MED ORDER — AMLODIPINE BESYLATE 10 MG PO TABS: 10.0000 mg | ORAL_TABLET | Freq: Every day | ORAL | Status: DC

## 2015-11-17 NOTE — Care Management (Signed)
Patient for discharge home today with resumption of home health through [facility].

## 2015-11-17 NOTE — Discharge Instructions (Signed)
Heart Failure Clinic appointment on December 02, 2015 at 11:00am with Darylene Price, [place]. Please call [phone] to reschedule.

## 2015-11-17 NOTE — Care Management Important Message (Signed)
Important Message  Patient Details  Name: Katherine [name] MRN: [id number] Date of Birth: 1929/08/27   Medicare Important Message Given:  Yes    Juliann Pulse A Pernella Ackerley 11/17/2015, 1:26 PM

## 2015-11-17 NOTE — Progress Notes (Signed)
Discharge instructions given. IV and tele removed. Patient's niece will be here shortly to pick her up. Prescriptions printed and given to patient as well as education on medications. Patient has no questions.

## 2015-11-17 NOTE — Progress Notes (Signed)
Patient: Katherine [name] / Admit Date: 11/14/2015 / Date of Encounter: 11/17/2015, 10:30 AM   Subjective: Breathing better. No chest pain. Has ambulated in the halls without issues. Renal function improved. BP elevated this morning.   Review of Systems: Review of Systems  Constitutional: Positive for weight loss and malaise/fatigue. Negative for fever, chills and diaphoresis.  HENT: Negative for congestion.   Eyes: Negative for discharge and redness.  Respiratory: Positive for shortness of breath. Negative for cough, hemoptysis, sputum production and wheezing.   Cardiovascular: Negative for chest pain, palpitations, orthopnea, claudication, leg swelling and PND.  Gastrointestinal: Negative for nausea and vomiting.  Musculoskeletal: Negative for falls.  Skin: Negative for rash.  Neurological: Positive for weakness. Negative for dizziness, tingling, tremors, sensory change, speech change and focal weakness.  Endo/Heme/Allergies: Does not bruise/bleed easily.  Psychiatric/Behavioral: The patient is not nervous/anxious.     Objective: Telemetry: NSR, 70's Physical Exam: Blood pressure 173/74, pulse 72, temperature 98.4 F (36.9 C), temperature source Oral, resp. rate 16, height 5\' 1"  (1.549 m), weight 147 lb 12.8 oz (67.042 kg), SpO2 94 %. Body mass index is 27.94 kg/(m^2). General: Frail appearing, in no acute distress. Head: Normocephalic, atraumatic, sclera non-icteric, no xanthomas, nares are without discharge. Neck: Negative for carotid bruits. JVP not elevated. Lungs: Clear bilaterally to auscultation without wheezes, rales, or rhonchi. Breathing is unlabored. Heart: RRR S1 S2 without murmurs, rubs, or gallops.  Abdomen: Soft, non-tender, non-distended with normoactive bowel sounds. No rebound/guarding. Extremities: No clubbing or cyanosis. Trace pre-tibial edema bilaterally. Distal pedal pulses are 2+ and equal bilaterally. Neuro: Alert and oriented X 3. Moves all extremities  spontaneously. Psych:  Responds to questions appropriately with a normal affect.   Intake/Output Summary (Last 24 hours) at 11/17/15 1030 Last data filed at 11/17/15 0900  Gross per 24 hour  Intake    723 ml  Output    800 ml  Net    -77 ml    Inpatient Medications:  . amLODipine  10 mg Oral QHS  . aspirin EC  81 mg Oral Daily  . atorvastatin  80 mg Oral q1800  . carvedilol  6.25 mg Oral BID WC  . clopidogrel  75 mg Oral Daily  . docusate sodium  100 mg Oral BID  . enoxaparin (LOVENOX) injection  40 mg Subcutaneous Q24H  . famotidine  20 mg Oral Daily  . furosemide  20 mg Oral Daily  . gabapentin  200 mg Oral TID  . lisinopril  40 mg Oral Daily  . omega-3 acid ethyl esters  1 g Oral Daily  . pantoprazole  40 mg Oral Daily  . sertraline  100 mg Oral QHS  . sodium chloride flush  3 mL Intravenous Q12H   Infusions:    Labs:  Recent Labs  11/15/15 0521 11/16/15 0527  NA 142 140  K 3.2* 4.0  CL 120* 112*  CO2 23 24  GLUCOSE 82 108*  BUN 21* 15  CREATININE 0.89 0.91  CALCIUM 6.6* 8.4*    Recent Labs  11/14/15 1335  AST 28  ALT 16  ALKPHOS 57  BILITOT 0.5  PROT 6.1*  ALBUMIN 3.4*    Recent Labs  11/14/15 1335 11/15/15 0521  WBC 9.1 6.1  NEUTROABS 6.2  --   HGB 9.7* 8.9*  HCT 29.3* 27.1*  MCV 94.3 96.7  PLT 171 139*    Recent Labs  11/14/15 1335 11/14/15 2306 11/15/15 0521  TROPONINI 0.53* 0.57* 0.60*   Invalid  input(s): POCBNP No results for input(s): HGBA1C in the last 72 hours.   Weights: Filed Weights   11/15/15 0842 11/16/15 0448 11/17/15 0551  Weight: 149 lb 8 oz (67.813 kg) 149 lb 12.8 oz (67.949 kg) 147 lb 12.8 oz (67.042 kg)     Radiology/Studies:  Dg Chest 2 View  10/29/2015  CLINICAL DATA:  Mid chest pain, vomiting since 4 p.m. EXAM: CHEST  2 VIEW COMPARISON:  None. FINDINGS: Cardiomegaly, mild to moderate in degree. Atherosclerotic changes noted at the aortic arch. Lungs are clear. Lung volumes are normal. No pleural  effusion or pneumothorax seen. Osseous and soft tissue structures about the chest are unremarkable. IMPRESSION: Cardiomegaly, mild to moderate in degree.  No evidence of CHF. Lungs are clear.  No evidence of pneumonia. Electronically Signed   By: Franki Cabot M.D.   On: 10/29/2015 22:50   Ct Hip Right Wo Contrast  11/15/2015  CLINICAL DATA:  [place] yesterday at home and injured right hip. History of right hip arthroplasty. EXAM: CT OF THE RIGHT HIP WITHOUT CONTRAST TECHNIQUE: Multidetector CT imaging of the right hip was performed according to the standard protocol. Multiplanar CT image reconstructions were also generated. COMPARISON:  Radiographs 11/14/2015 FINDINGS: Moderate artifact associated with the right total hip arthroplasty. The acetabular and femoral components appear well seated. No complicating features such as loosening or periprosthetic fracture are identified. The visualized right bony pelvis appears intact. No definite pubic rami fractures. No obvious intermuscular hematoma is identified. No significant intrapelvic abnormalities. IMPRESSION: Intact right total hip arthroplasty. No complicating features such as loosening or periprosthetic fracture. Electronically Signed   By: Marijo Sanes M.D.   On: 11/15/2015 11:41   Dg Femur, Min 2 Views Right  11/14/2015  CLINICAL DATA:  Fall in bathroom today. Right hip pain. Pain in right hip with movement. EXAM: RIGHT FEMUR 2 VIEWS COMPARISON:  None. FINDINGS: A right total hip arthroplasty is noted. The prosthesis is located. There is no fracture associated. Adjacent soft tissue swelling is noted. The visualized pelvis is unremarkable. IMPRESSION: 1. Right total hip arthroplasty without radiographic evidence for complication. 2. No acute abnormality of the right femur. Electronically Signed   By: San Morelle M.D.   On: 11/14/2015 15:01     Assessment and Plan   1. Elevated troponin: -Felt to be in the setting of residual from her recent  NSTEMI and exacerbated by ARF -No symptoms of recurrent MI  -No ischemic work up planned at this time -Follow up as outpatient here until/if she is able to go back to [place]  2. CAD/ICM: -Has CHF Clinic appointment -She does not appear volume overloaded at this time  -On Coreg, lisinopril, and low-dose Lasix with PRN sliding scale for weight gain > 3 pounds take an additional 20 mg -HF education -On aspirin, if anemia persists consider stopping   3. ARF: -Improved -Avoid nephrotoxins   4. Accelerated HTN: -Hydralazine given this morning -Antihypertensives titrated    5. Anemia: -Per IM -No sign of bleed  Melvern Banker, PA-C Pager: [phone] 11/17/2015, 10:30 AM

## 2015-11-17 NOTE — Progress Notes (Signed)
Initial Heart Failure Clinic appointment scheduled for December 02, 2015 at 11:00am. Thank you for the referral.

## 2015-11-18 DIAGNOSIS — I5022 Chronic systolic (congestive) heart failure: Secondary | ICD-10-CM | POA: Diagnosis not present

## 2015-11-18 DIAGNOSIS — I255 Ischemic cardiomyopathy: Secondary | ICD-10-CM | POA: Diagnosis not present

## 2015-11-18 DIAGNOSIS — I272 Other secondary pulmonary hypertension: Secondary | ICD-10-CM | POA: Diagnosis not present

## 2015-11-18 DIAGNOSIS — N183 Chronic kidney disease, stage 3 (moderate): Secondary | ICD-10-CM | POA: Diagnosis not present

## 2015-11-18 DIAGNOSIS — I214 Non-ST elevation (NSTEMI) myocardial infarction: Secondary | ICD-10-CM | POA: Diagnosis not present

## 2015-11-18 DIAGNOSIS — J449 Chronic obstructive pulmonary disease, unspecified: Secondary | ICD-10-CM | POA: Diagnosis not present

## 2015-11-18 DIAGNOSIS — I130 Hypertensive heart and chronic kidney disease with heart failure and stage 1 through stage 4 chronic kidney disease, or unspecified chronic kidney disease: Secondary | ICD-10-CM | POA: Diagnosis not present

## 2015-11-18 DIAGNOSIS — E785 Hyperlipidemia, unspecified: Secondary | ICD-10-CM | POA: Diagnosis not present

## 2015-11-19 DIAGNOSIS — I5022 Chronic systolic (congestive) heart failure: Secondary | ICD-10-CM | POA: Diagnosis not present

## 2015-11-19 DIAGNOSIS — J449 Chronic obstructive pulmonary disease, unspecified: Secondary | ICD-10-CM | POA: Diagnosis not present

## 2015-11-19 DIAGNOSIS — I255 Ischemic cardiomyopathy: Secondary | ICD-10-CM | POA: Diagnosis not present

## 2015-11-19 DIAGNOSIS — E785 Hyperlipidemia, unspecified: Secondary | ICD-10-CM | POA: Diagnosis not present

## 2015-11-19 DIAGNOSIS — N183 Chronic kidney disease, stage 3 (moderate): Secondary | ICD-10-CM | POA: Diagnosis not present

## 2015-11-19 DIAGNOSIS — I130 Hypertensive heart and chronic kidney disease with heart failure and stage 1 through stage 4 chronic kidney disease, or unspecified chronic kidney disease: Secondary | ICD-10-CM | POA: Diagnosis not present

## 2015-11-19 DIAGNOSIS — I272 Other secondary pulmonary hypertension: Secondary | ICD-10-CM | POA: Diagnosis not present

## 2015-11-19 DIAGNOSIS — I214 Non-ST elevation (NSTEMI) myocardial infarction: Secondary | ICD-10-CM | POA: Diagnosis not present

## 2015-11-20 DIAGNOSIS — I255 Ischemic cardiomyopathy: Secondary | ICD-10-CM | POA: Diagnosis not present

## 2015-11-20 DIAGNOSIS — N183 Chronic kidney disease, stage 3 (moderate): Secondary | ICD-10-CM | POA: Diagnosis not present

## 2015-11-20 DIAGNOSIS — J449 Chronic obstructive pulmonary disease, unspecified: Secondary | ICD-10-CM | POA: Diagnosis not present

## 2015-11-20 DIAGNOSIS — I5022 Chronic systolic (congestive) heart failure: Secondary | ICD-10-CM | POA: Diagnosis not present

## 2015-11-20 DIAGNOSIS — E785 Hyperlipidemia, unspecified: Secondary | ICD-10-CM | POA: Diagnosis not present

## 2015-11-20 DIAGNOSIS — I272 Other secondary pulmonary hypertension: Secondary | ICD-10-CM | POA: Diagnosis not present

## 2015-11-20 DIAGNOSIS — I214 Non-ST elevation (NSTEMI) myocardial infarction: Secondary | ICD-10-CM | POA: Diagnosis not present

## 2015-11-20 DIAGNOSIS — I130 Hypertensive heart and chronic kidney disease with heart failure and stage 1 through stage 4 chronic kidney disease, or unspecified chronic kidney disease: Secondary | ICD-10-CM | POA: Diagnosis not present

## 2015-11-21 ENCOUNTER — Ambulatory Visit (INDEPENDENT_AMBULATORY_CARE_PROVIDER_SITE_OTHER): Payer: Medicare HMO | Admitting: Cardiovascular Disease

## 2015-11-21 ENCOUNTER — Encounter: Payer: Self-pay | Admitting: Cardiovascular Disease

## 2015-11-21 VITALS — BP 140/60 | HR 64 | Ht 61.0 in | Wt 149.0 lb

## 2015-11-21 DIAGNOSIS — I5042 Chronic combined systolic (congestive) and diastolic (congestive) heart failure: Secondary | ICD-10-CM | POA: Diagnosis not present

## 2015-11-21 DIAGNOSIS — I119 Hypertensive heart disease without heart failure: Secondary | ICD-10-CM

## 2015-11-21 DIAGNOSIS — I214 Non-ST elevation (NSTEMI) myocardial infarction: Secondary | ICD-10-CM | POA: Diagnosis not present

## 2015-11-21 DIAGNOSIS — I255 Ischemic cardiomyopathy: Secondary | ICD-10-CM

## 2015-11-21 NOTE — Progress Notes (Signed)
Cardiology Office Note   Date:  11/21/2015   ID:  Katherine [name], DOB [birth year], MRN [id number]  PCP:  No PCP Per Patient  Cardiologist:   Kathlyn Sacramento, MD   Chief Complaint  Patient presents with  . other    Follow up from [facility] s/p cardiac cath. Meds reviewed by the patient verbally. "Doing well."       History of Present Illness: Katherine [name] is a 82 y.o. female who presents for A follow-up visit regarding chronic systolic heart failure and coronary artery disease. She has a history of remote myocardial infarction, CHF, chronic kidney disease, and COPD. She is from [place] but visiting in the [place] area with her sister currently. She was recently evaluated at the [facility] emergency room secondary to chest pain and shortness of breath with subsequent finding of mild troponin elevation and creatinine of 1.56. She underwent stress testing revealing anterior scar with minimal ischemia and severe LV dysfunction.   On March 15, she developed recurrent chest pain radiating to her neck, associated with nausea, vomiting, and dyspnea. She presented to the [facility] emergency department where she had non-ST elevation MI peak troponin 3.22.She underwent diagnostic catheterization on March 16 revealing a chronic total occlusion of the LAD with moderate left circumflex disease and otherwise minor irregularities. Fractional flow reserve was performed within the left circumflex and was normal at 0.92. Medical therapy was recommended. EF was 35% and right heart catheterization revealed pulmonary hypertension and elevated filling pressures with a pulmonary capillary wedge pressure of 22. She was treated with 1 dose of IV Lasix and this was subsequently transitioned to oral Lasix. We also titrated beta blocker therapy while adding ACE inhibitor and weaning off her previous home dose of clonidine. Following gentle diuresis, she has remained euvolemic and her weight is 141 pounds this morning.  Echocardiogram showed an EF 35-40% with grade 1 diastolic dysfunction. Of note, TSH was found to be mildly suppressed @ 3.22, however T3 and FT4 were wnl.  She was rehospitalized on March 31 at [facility] after she presented with a fall. She was found to have acute renal failure with a creatinine of 2. This improved with gentle diuresis. She bruised her right hip but there was no evidence of fracture. She was discharged home and currently getting physical therapy. Her mobility is still somewhat limited. She denies any chest pain. Her dyspnea is stable.    Past Medical History  Diagnosis Date  . Chronic combined systolic and diastolic heart failure (HCC)     EF 35-40%, Akinesis of mid-apicalanteroseptal wall & AK of apex, Gr 1DD.  Katherine [name] Hypertensive heart disease   . COPD (chronic obstructive pulmonary disease) ([place])     worked in a [employer]  . Ischemic cardiomyopathy   . CKD (chronic kidney disease), stage III   . Occlusive coronary artery disease requiring drug therapy     a. Remote MI - never sought treatment when it occurred, shows up old on EKG;  b. Cath 10/2015 - 100% pLAD CTO with 80% D1, prox Cx 60% - EF ~35%    Past Surgical History  Procedure Laterality Date  . Shoulder surgery    . Cardiac catheterization N/A 10/30/2015    Procedure: Right/Left Heart Cath and Coronary Angiography;  Surgeon: Wellington Hampshire, MD;  Location: [place] [facility];  Service: Cardiovascular;  Laterality: N/A;  . Cardiac catheterization N/A 10/30/2015    Procedure: Intravascular Pressure Wire/FFR Study;  Surgeon: Mertie Clause  Fletcher Anon, MD;  Location: [place] [facility];  Service: Cardiovascular;  Laterality: N/A;     Current Outpatient Prescriptions  Medication Sig Dispense Refill  . amLODipine (NORVASC) 10 MG tablet Take 1 tablet (10 mg total) by mouth at bedtime. 30 tablet 0  . aspirin EC 81 MG tablet Take 81 mg by mouth daily.    Katherine [name] atorvastatin (LIPITOR) 80 MG tablet Take 1 tablet (80 mg total) by mouth daily at 6  PM. 90 tablet 3  . carvedilol (COREG) 6.25 MG tablet Take 1 tablet (6.25 mg total) by mouth 2 (two) times daily with a meal. 180 tablet 3  . clopidogrel (PLAVIX) 75 MG tablet Take 1 tablet (75 mg total) by mouth daily. 30 tablet 3  . furosemide (LASIX) 20 MG tablet Take 1 tablet (20 mg total) by mouth daily. 90 tablet 3  . gabapentin (NEURONTIN) 100 MG capsule Take 200 mg by mouth 3 (three) times daily.    Katherine [name] HYDROcodone-acetaminophen (NORCO/VICODIN) 5-325 MG tablet Take 0.5-1 tablets by mouth daily as needed for moderate pain.    Katherine [name] lisinopril (PRINIVIL,ZESTRIL) 40 MG tablet Take 1 tablet (40 mg total) by mouth daily. 30 tablet 0  . nitroGLYCERIN (NITROSTAT) 0.4 MG SL tablet Place 1 tablet (0.4 mg total) under the tongue every 5 (five) minutes as needed for chest pain. 25 tablet 3  . Omega-3 Fatty Acids (FISH OIL) 1000 MG CAPS Take 1,000 mg by mouth daily.    . pantoprazole (PROTONIX) 40 MG tablet Take 1 tablet (40 mg total) by mouth daily. 30 tablet 3  . ranitidine (ZANTAC) 75 MG tablet Take 75 mg by mouth 2 (two) times daily.    . sertraline (ZOLOFT) 100 MG tablet Take 100 mg by mouth at bedtime.     . traMADol (ULTRAM) 50 MG tablet Take 50 mg by mouth every 12 (twelve) hours as needed for moderate pain.      No current facility-administered medications for this visit.    Allergies:   Review of patient's allergies indicates no known allergies.    Social History:  The patient  reports that she has never smoked. She does not have any smokeless tobacco history on file. She reports that she does not drink alcohol.   Family History:  The patient's family history includes CAD in her brother and sister; Cancer in her mother.    ROS:  Please see the history of present illness.   Otherwise, review of systems are positive for none.   All other systems are reviewed and negative.    PHYSICAL EXAM: VS:  BP 140/60 mmHg  Pulse 64  Ht 5\' 1"  (1.549 m)  Wt 149 lb (67.586 kg)  BMI 28.17 kg/m2 , BMI  Body mass index is 28.17 kg/(m^2). GEN: Well nourished, well developed, in no acute distress HEENT: normal Neck: no JVD, carotid bruits, or masses Cardiac: RRR; no rubs, or gallops,no edema . There is one out of 6 systolic ejection murmur in the aortic area Respiratory:  clear to auscultation bilaterally, normal work of breathing GI: soft, nontender, nondistended, + BS MS: no deformity or atrophy Skin: warm and dry, no rash Neuro:  Strength and sensation are intact Psych: euthymic mood, full affect   EKG:  EKG is ordered today. The ekg ordered today demonstrates  normal sinus rhythm with nonspecific ST and T wave changes.   Recent Labs: 10/30/2015: B Natriuretic Peptide 1649.7*; Magnesium 2.2; TSH 0.345* 11/14/2015: ALT 16 11/15/2015: Hemoglobin 8.9*; Platelets 139* 11/16/2015: BUN 15;  Creatinine, Ser 0.91; Potassium 4.0; Sodium 140    Lipid Panel    Component Value Date/Time   CHOL 186 10/30/2015 0855   TRIG 139 10/30/2015 0855   HDL 40* 10/30/2015 0855   CHOLHDL 4.7 10/30/2015 0855   VLDL 28 10/30/2015 0855   LDLCALC 118* 10/30/2015 0855      Wt Readings from Last 3 Encounters:  11/21/15 149 lb (67.586 kg)  11/17/15 147 lb 12.8 oz (67.042 kg)  11/10/15 147 lb 3.2 oz (66.769 kg)        ASSESSMENT AND PLAN:  1.  Chronic systolic heart failure: The patient appears to be euvolemic on small dose furosemide. I elected not to increase the dose of carvedilol due to relatively low heart rate. If renal function remains stable, I am planning to add spironolactone in one month. Check basic metabolic profile in 1 month with her visit.  2. Coronary artery disease involving native coronary arteries without angina: She has chronically occluded LAD with collaterals and moderate left circumflex stenosis which was not significant by FFR. She is currently on dual antiplatelet therapy.  3. Essential hypertension: She reports prolonged history of hypertension. Blood pressure is now more  controlled after the addition of amlodipine. She was weaned off clonidine. Once we introduce spironolactone, we can likely decreased the dose of amlodipine.  4. Hyperlipidemia: Continue high dose atorvastatin. Check fasting lipid and liver profile upon follow-up.  5. Continue physical therapy at home. The patient was prescribed a walker.    Disposition:   FU with me in 1 month  Signed,  Kathlyn Sacramento, MD  11/21/2015 6:43 PM    [place]

## 2015-11-21 NOTE — Patient Instructions (Signed)
Medication Instructions:  Your physician recommends that you continue on your current medications as directed. Please refer to the Current Medication list given to you today.   Labwork: Fasting lipid and liver and BMET in one month. Nothing to eat or drink after midnight the evening before your labs  Testing/Procedures: none  Follow-Up: Your physician recommends that you schedule a follow-up appointment in: one month with Dr. Fletcher Anon.    Any Other Special Instructions Will Be Listed Below (If Applicable). Please follow a low sodium diet.       If you need a refill on your cardiac medications before your next appointment, please call your pharmacy.  Low-Sodium Eating Plan Sodium raises blood pressure and causes water to be held in the body. Getting less sodium from food will help lower your blood pressure, reduce any swelling, and protect your heart, liver, and kidneys. We get sodium by adding salt (sodium chloride) to food. Most of our sodium comes from canned, boxed, and frozen foods. Restaurant foods, fast foods, and pizza are also very high in sodium. Even if you take medicine to lower your blood pressure or to reduce fluid in your body, getting less sodium from your food is important. WHAT IS MY PLAN? Most people should limit their sodium intake to 2,300 mg a day. Your health care provider recommends that you limit your sodium intake. WHAT DO I NEED TO KNOW ABOUT THIS EATING PLAN? For the low-sodium eating plan, you will follow these general guidelines:  Choose foods with a % Daily Value for sodium of less than 5% (as listed on the food label).   Use salt-free seasonings or herbs instead of table salt or sea salt.   Check with your health care provider or pharmacist before using salt substitutes.   Eat fresh foods.  Eat more vegetables and fruits.  Limit canned vegetables. If you do use them, rinse them well to decrease the sodium.   Limit cheese to 1 oz (28 g) per  day.   Eat lower-sodium products, often labeled as "lower sodium" or "no salt added."  Avoid foods that contain monosodium glutamate (MSG). MSG is sometimes added to [place] food and some canned foods.  Check food labels (Nutrition Facts labels) on foods to learn how much sodium is in one serving.  Eat more home-cooked food and less restaurant, buffet, and fast food.  When eating at a restaurant, ask that your food be prepared with less salt, or no salt if possible.  HOW DO I READ FOOD LABELS FOR SODIUM INFORMATION? The Nutrition Facts label lists the amount of sodium in one serving of the food. If you eat more than one serving, you must multiply the listed amount of sodium by the number of servings. Food labels may also identify foods as:  Sodium free--Less than 5 mg in a serving.  Very low sodium--35 mg or less in a serving.  Low sodium--140 mg or less in a serving.  Light in sodium--50% less sodium in a serving. For example, if a food that usually has 300 mg of sodium is changed to become light in sodium, it will have 150 mg of sodium.  Reduced sodium--25% less sodium in a serving. For example, if a food that usually has 400 mg of sodium is changed to reduced sodium, it will have 300 mg of sodium. WHAT FOODS CAN I EAT? Grains Low-sodium cereals, including oats, puffed wheat and rice, and shredded wheat cereals. Low-sodium crackers. Unsalted rice and pasta. Lower-sodium bread.  Vegetables Frozen or fresh vegetables. Low-sodium or reduced-sodium canned vegetables. Low-sodium or reduced-sodium tomato sauce and paste. Low-sodium or reduced-sodium tomato and vegetable juices.  Fruits Fresh, frozen, and canned fruit. Fruit juice.  Meat and Other Protein Products Low-sodium canned tuna and salmon. Fresh or frozen meat, poultry, seafood, and fish. Lamb. Unsalted nuts. Dried beans, peas, and lentils without added salt. Unsalted canned beans. Homemade soups without salt. Eggs.    Dairy Milk. Soy milk. Ricotta cheese. Low-sodium or reduced-sodium cheeses. Yogurt.  Condiments Fresh and dried herbs and spices. Salt-free seasonings. Onion and garlic powders. Low-sodium varieties of mustard and ketchup. Fresh or refrigerated horseradish. Lemon juice.  Fats and Oils Reduced-sodium salad dressings. Unsalted butter.  Other Unsalted popcorn and pretzels.  The items listed above may not be a complete list of recommended foods or beverages. Contact your dietitian for more options. WHAT FOODS ARE NOT RECOMMENDED? Grains Instant hot cereals. Bread stuffing, pancake, and biscuit mixes. Croutons. Seasoned rice or pasta mixes. Noodle soup cups. Boxed or frozen macaroni and cheese. Self-rising flour. Regular salted crackers. Vegetables Regular canned vegetables. Regular canned tomato sauce and paste. Regular tomato and vegetable juices. Frozen vegetables in sauces. Salted [place] fries. Olives. Angie Fava. Relishes. Sauerkraut. Salsa. Meat and Other Protein Products Salted, canned, smoked, spiced, or pickled meats, seafood, or fish. Bacon, ham, sausage, hot dogs, corned beef, chipped beef, and packaged luncheon meats. Salt pork. Jerky. Pickled herring. Anchovies, regular canned tuna, and sardines. Salted nuts. Dairy Processed cheese and cheese spreads. Cheese curds. Blue cheese and cottage cheese. Buttermilk.  Condiments Onion and garlic salt, seasoned salt, table salt, and sea salt. Canned and packaged gravies. Worcestershire sauce. Tartar sauce. Barbecue sauce. Teriyaki sauce. Soy sauce, including reduced sodium. Steak sauce. Fish sauce. Oyster sauce. Cocktail sauce. Horseradish that you find on the shelf. Regular ketchup and mustard. Meat flavorings and tenderizers. Bouillon cubes. Hot sauce. Tabasco sauce. Marinades. Taco seasonings. Relishes. Fats and Oils Regular salad dressings. Salted butter. Margarine. Ghee. Bacon fat.  Other Potato and tortilla chips. Corn chips  and puffs. Salted popcorn and pretzels. Canned or dried soups. Pizza. Frozen entrees and pot pies.  The items listed above may not be a complete list of foods and beverages to avoid. Contact your dietitian for more information.   This information is not intended to replace advice given to you by your health care provider. Make sure you discuss any questions you have with your health care provider.   Document Released: 01/22/2002 Document Revised: 08/23/2014 Document Reviewed: 06/06/2013 Elsevier Interactive Patient Education [employer].

## 2015-11-24 DIAGNOSIS — I214 Non-ST elevation (NSTEMI) myocardial infarction: Secondary | ICD-10-CM | POA: Diagnosis not present

## 2015-11-24 DIAGNOSIS — N183 Chronic kidney disease, stage 3 (moderate): Secondary | ICD-10-CM | POA: Diagnosis not present

## 2015-11-24 DIAGNOSIS — E785 Hyperlipidemia, unspecified: Secondary | ICD-10-CM | POA: Diagnosis not present

## 2015-11-24 DIAGNOSIS — I255 Ischemic cardiomyopathy: Secondary | ICD-10-CM | POA: Diagnosis not present

## 2015-11-24 DIAGNOSIS — I272 Other secondary pulmonary hypertension: Secondary | ICD-10-CM | POA: Diagnosis not present

## 2015-11-24 DIAGNOSIS — I130 Hypertensive heart and chronic kidney disease with heart failure and stage 1 through stage 4 chronic kidney disease, or unspecified chronic kidney disease: Secondary | ICD-10-CM | POA: Diagnosis not present

## 2015-11-24 DIAGNOSIS — I5022 Chronic systolic (congestive) heart failure: Secondary | ICD-10-CM | POA: Diagnosis not present

## 2015-11-24 DIAGNOSIS — J449 Chronic obstructive pulmonary disease, unspecified: Secondary | ICD-10-CM | POA: Diagnosis not present

## 2015-11-26 DIAGNOSIS — I272 Other secondary pulmonary hypertension: Secondary | ICD-10-CM | POA: Diagnosis not present

## 2015-11-26 DIAGNOSIS — I5022 Chronic systolic (congestive) heart failure: Secondary | ICD-10-CM | POA: Diagnosis not present

## 2015-11-26 DIAGNOSIS — N183 Chronic kidney disease, stage 3 (moderate): Secondary | ICD-10-CM | POA: Diagnosis not present

## 2015-11-26 DIAGNOSIS — I130 Hypertensive heart and chronic kidney disease with heart failure and stage 1 through stage 4 chronic kidney disease, or unspecified chronic kidney disease: Secondary | ICD-10-CM | POA: Diagnosis not present

## 2015-11-26 DIAGNOSIS — J449 Chronic obstructive pulmonary disease, unspecified: Secondary | ICD-10-CM | POA: Diagnosis not present

## 2015-11-26 DIAGNOSIS — E785 Hyperlipidemia, unspecified: Secondary | ICD-10-CM | POA: Diagnosis not present

## 2015-11-26 DIAGNOSIS — I255 Ischemic cardiomyopathy: Secondary | ICD-10-CM | POA: Diagnosis not present

## 2015-11-26 DIAGNOSIS — I214 Non-ST elevation (NSTEMI) myocardial infarction: Secondary | ICD-10-CM | POA: Diagnosis not present

## 2015-11-28 DIAGNOSIS — I214 Non-ST elevation (NSTEMI) myocardial infarction: Secondary | ICD-10-CM | POA: Diagnosis not present

## 2015-11-28 DIAGNOSIS — I130 Hypertensive heart and chronic kidney disease with heart failure and stage 1 through stage 4 chronic kidney disease, or unspecified chronic kidney disease: Secondary | ICD-10-CM | POA: Diagnosis not present

## 2015-11-28 DIAGNOSIS — E785 Hyperlipidemia, unspecified: Secondary | ICD-10-CM | POA: Diagnosis not present

## 2015-11-28 DIAGNOSIS — I5022 Chronic systolic (congestive) heart failure: Secondary | ICD-10-CM | POA: Diagnosis not present

## 2015-11-28 DIAGNOSIS — I255 Ischemic cardiomyopathy: Secondary | ICD-10-CM | POA: Diagnosis not present

## 2015-11-28 DIAGNOSIS — I272 Other secondary pulmonary hypertension: Secondary | ICD-10-CM | POA: Diagnosis not present

## 2015-11-28 DIAGNOSIS — J449 Chronic obstructive pulmonary disease, unspecified: Secondary | ICD-10-CM | POA: Diagnosis not present

## 2015-11-28 DIAGNOSIS — N183 Chronic kidney disease, stage 3 (moderate): Secondary | ICD-10-CM | POA: Diagnosis not present

## 2015-12-01 ENCOUNTER — Other Ambulatory Visit: Payer: Self-pay

## 2015-12-01 ENCOUNTER — Telehealth: Payer: Self-pay | Admitting: Cardiovascular Disease

## 2015-12-01 DIAGNOSIS — E785 Hyperlipidemia, unspecified: Secondary | ICD-10-CM | POA: Diagnosis not present

## 2015-12-01 DIAGNOSIS — I272 Other secondary pulmonary hypertension: Secondary | ICD-10-CM | POA: Diagnosis not present

## 2015-12-01 DIAGNOSIS — I509 Heart failure, unspecified: Secondary | ICD-10-CM

## 2015-12-01 DIAGNOSIS — I130 Hypertensive heart and chronic kidney disease with heart failure and stage 1 through stage 4 chronic kidney disease, or unspecified chronic kidney disease: Secondary | ICD-10-CM | POA: Diagnosis not present

## 2015-12-01 DIAGNOSIS — I5022 Chronic systolic (congestive) heart failure: Secondary | ICD-10-CM | POA: Diagnosis not present

## 2015-12-01 DIAGNOSIS — J449 Chronic obstructive pulmonary disease, unspecified: Secondary | ICD-10-CM | POA: Diagnosis not present

## 2015-12-01 DIAGNOSIS — I214 Non-ST elevation (NSTEMI) myocardial infarction: Secondary | ICD-10-CM | POA: Diagnosis not present

## 2015-12-01 DIAGNOSIS — N183 Chronic kidney disease, stage 3 (moderate): Secondary | ICD-10-CM | POA: Diagnosis not present

## 2015-12-01 DIAGNOSIS — I255 Ischemic cardiomyopathy: Secondary | ICD-10-CM | POA: Diagnosis not present

## 2015-12-01 NOTE — Telephone Encounter (Signed)
Check BMP and magnesium now.

## 2015-12-01 NOTE — Telephone Encounter (Signed)
S/w pt of Dr. Tyrell Antonio recommendations for labs. Pt agreeable w/plan but states she does not have a ride today. She will go tomorrow before her appt w/CHF clinic at 11am. Orders placed for mag and BMET to be drawn at [facility]  Pt verbalized understanding of instructions including where to go for labs. She had no further questions.

## 2015-12-01 NOTE — Telephone Encounter (Signed)
S/w pt who reports bilateral lower extremity cramping for approximately 1 week. Right leg is worse than the left. No swelling. States yesterday it was "unbearable" and nothing she does relieves the pain. She takes lasix 20mg  qd. She does not take potassium supplement. Potassium was 4.0 on April 2. She is to have repeat BMET May 2. Informed pt I will make Dr. Fletcher Anon aware to review and advise.  Pt verbalized understanding and is agreeable w/plan.

## 2015-12-01 NOTE — Telephone Encounter (Signed)
Physical Therapist with [facility] states pt has been having severe cramping in both legs for about a week, right worse than left. Please call.

## 2015-12-02 ENCOUNTER — Ambulatory Visit: Payer: Medicare HMO | Attending: Family | Admitting: Family

## 2015-12-02 ENCOUNTER — Encounter: Payer: Self-pay | Admitting: Family

## 2015-12-02 VITALS — BP 138/68 | HR 52 | Resp 20 | Ht 61.0 in | Wt 142.0 lb

## 2015-12-02 DIAGNOSIS — Z8249 Family history of ischemic heart disease and other diseases of the circulatory system: Secondary | ICD-10-CM | POA: Diagnosis not present

## 2015-12-02 DIAGNOSIS — I110 Hypertensive heart disease with heart failure: Secondary | ICD-10-CM | POA: Insufficient documentation

## 2015-12-02 DIAGNOSIS — J449 Chronic obstructive pulmonary disease, unspecified: Secondary | ICD-10-CM | POA: Diagnosis not present

## 2015-12-02 DIAGNOSIS — I255 Ischemic cardiomyopathy: Secondary | ICD-10-CM | POA: Diagnosis not present

## 2015-12-02 DIAGNOSIS — I252 Old myocardial infarction: Secondary | ICD-10-CM | POA: Diagnosis not present

## 2015-12-02 DIAGNOSIS — I129 Hypertensive chronic kidney disease with stage 1 through stage 4 chronic kidney disease, or unspecified chronic kidney disease: Secondary | ICD-10-CM | POA: Insufficient documentation

## 2015-12-02 DIAGNOSIS — I119 Hypertensive heart disease without heart failure: Secondary | ICD-10-CM | POA: Insufficient documentation

## 2015-12-02 DIAGNOSIS — I10 Essential (primary) hypertension: Secondary | ICD-10-CM | POA: Insufficient documentation

## 2015-12-02 DIAGNOSIS — I5022 Chronic systolic (congestive) heart failure: Secondary | ICD-10-CM | POA: Diagnosis not present

## 2015-12-02 DIAGNOSIS — J45909 Unspecified asthma, uncomplicated: Secondary | ICD-10-CM | POA: Diagnosis not present

## 2015-12-02 DIAGNOSIS — N183 Chronic kidney disease, stage 3 (moderate): Secondary | ICD-10-CM | POA: Diagnosis not present

## 2015-12-02 DIAGNOSIS — I5042 Chronic combined systolic (congestive) and diastolic (congestive) heart failure: Secondary | ICD-10-CM

## 2015-12-02 DIAGNOSIS — Z79899 Other long term (current) drug therapy: Secondary | ICD-10-CM | POA: Diagnosis not present

## 2015-12-02 DIAGNOSIS — I2510 Atherosclerotic heart disease of native coronary artery without angina pectoris: Secondary | ICD-10-CM | POA: Diagnosis not present

## 2015-12-02 DIAGNOSIS — Z7982 Long term (current) use of aspirin: Secondary | ICD-10-CM | POA: Diagnosis not present

## 2015-12-02 DIAGNOSIS — R001 Bradycardia, unspecified: Secondary | ICD-10-CM | POA: Insufficient documentation

## 2015-12-02 DIAGNOSIS — R252 Cramp and spasm: Secondary | ICD-10-CM | POA: Insufficient documentation

## 2015-12-02 DIAGNOSIS — Z9889 Other specified postprocedural states: Secondary | ICD-10-CM | POA: Diagnosis not present

## 2015-12-02 LAB — BASIC METABOLIC PANEL
ANION GAP: 7 (ref 5–15)
BUN: 23 mg/dL — AB (ref 6–20)
CHLORIDE: 105 mmol/L (ref 101–111)
CO2: 29 mmol/L (ref 22–32)
Calcium: 9.6 mg/dL (ref 8.9–10.3)
Creatinine, Ser: 0.98 mg/dL (ref 0.44–1.00)
GFR, EST NON AFRICAN AMERICAN: 52 mL/min — AB (ref >60)
Glucose, Bld: 111 mg/dL — ABNORMAL HIGH (ref 65–99)
POTASSIUM: 4.3 mmol/L (ref 3.5–5.1)
SODIUM: 141 mmol/L (ref 135–145)

## 2015-12-02 LAB — MAGNESIUM: MAGNESIUM: 2.3 mg/dL (ref 1.7–2.4)

## 2015-12-02 NOTE — Patient Instructions (Signed)
Continue weighing daily and call for an overnight weight gain of > 2 pounds or a weekly weight gain of >5 pounds. 

## 2015-12-02 NOTE — Progress Notes (Signed)
Subjective:    Patient ID: Katherine [name], female    DOB: 31-Oct-1929, 82 y.o.   MRN: [id number]  Congestive Heart Failure Presents for initial visit. The disease course has been improving. Associated symptoms include edema, fatigue and shortness of breath. Pertinent negatives include no abdominal pain, chest pain, chest pressure, orthopnea or palpitations. The symptoms have been improving. Past treatments include beta blockers, ACE inhibitors and salt and fluid restriction. The treatment provided mild relief. Compliance with prior treatments has been good. Her past medical history is significant for CAD and chronic lung disease. There is no history of CVA or DM. She has multiple 1st degree relatives with heart disease.  Hypertension This is a chronic problem. The current episode started more than 1 year ago. The problem is controlled. Associated symptoms include peripheral edema and shortness of breath. Pertinent negatives include no chest pain, headaches or palpitations. There are no associated agents to hypertension. Risk factors for coronary artery disease include post-menopausal state and family history. Past treatments include beta blockers, diuretics, lifestyle changes and ACE inhibitors. The current treatment provides moderate improvement. Compliance problems include exercise.  Hypertensive end-organ damage includes kidney disease, CAD/MI and heart failure.   Past Medical History  Diagnosis Date  . Chronic combined systolic and diastolic heart failure (HCC)     EF 35-40%, Akinesis of mid-apicalanteroseptal wall & AK of apex, Gr 1DD.  Marland Kitchen Hypertensive heart disease   . COPD (chronic obstructive pulmonary disease) ([place])     worked in a [place]  . Ischemic cardiomyopathy   . CKD (chronic kidney disease), stage III   . Occlusive coronary artery disease requiring drug therapy     a. Remote MI - never sought treatment when it occurred, shows up old on EKG;  b. Cath 10/2015 - 100% pLAD CTO with 80%  D1, prox Cx 60% - EF ~35%    Past Surgical History  Procedure Laterality Date  . Shoulder surgery    . Cardiac catheterization N/A 10/30/2015    Procedure: Right/Left Heart Cath and Coronary Angiography;  Surgeon: Wellington Hampshire, MD;  Location: [facility];  Service: Cardiovascular;  Laterality: N/A;  . Cardiac catheterization N/A 10/30/2015    Procedure: Intravascular Pressure Wire/FFR Study;  Surgeon: Wellington Hampshire, MD;  Location: [facility];  Service: Cardiovascular;  Laterality: N/A;    Family History  Problem Relation Age of Onset  . Cancer Mother   . CAD Sister   . CAD Brother     Social History  Substance Use Topics  . Smoking status: Never Smoker   . Smokeless tobacco: Never Used  . Alcohol Use: No    No Known Allergies  Prior to Admission medications   Medication Sig Start Date End Date Taking? Authorizing Provider  amLODipine (NORVASC) 10 MG tablet Take 1 tablet (10 mg total) by mouth at bedtime. 11/17/15  Yes Bettey Costa, MD  aspirin EC 81 MG tablet Take 81 mg by mouth daily.   Yes Historical Provider, MD  atorvastatin (LIPITOR) 80 MG tablet Take 1 tablet (80 mg total) by mouth daily at 6 PM. 11/14/15  Yes Imogene Burn, PA-C  carvedilol (COREG) 6.25 MG tablet Take 1 tablet (6.25 mg total) by mouth 2 (two) times daily with a meal. 11/14/15  Yes Imogene Burn, PA-C  clopidogrel (PLAVIX) 75 MG tablet Take 1 tablet (75 mg total) by mouth daily. 11/14/15  Yes Imogene Burn, PA-C  furosemide (LASIX) 20 MG tablet Take  1 tablet (20 mg total) by mouth daily. 11/14/15  Yes Imogene Burn, PA-C  gabapentin (NEURONTIN) 100 MG capsule Take 200 mg by mouth 3 (three) times daily.   Yes Historical Provider, MD  HYDROcodone-acetaminophen (NORCO/VICODIN) 5-325 MG tablet Take 0.5-1 tablets by mouth daily as needed for moderate pain.   Yes Historical Provider, MD  lisinopril (PRINIVIL,ZESTRIL) 40 MG tablet Take 1 tablet (40 mg total) by mouth daily. 11/17/15  Yes Bettey Costa, MD  nitroGLYCERIN (NITROSTAT) 0.4 MG SL tablet Place 1 tablet (0.4 mg total) under the tongue every 5 (five) minutes as needed for chest pain. 11/14/15  Yes Imogene Burn, PA-C  Omega-3 Fatty Acids (FISH OIL) 1000 MG CAPS Take 1,000 mg by mouth daily.   Yes Historical Provider, MD  pantoprazole (PROTONIX) 40 MG tablet Take 1 tablet (40 mg total) by mouth daily. 11/02/15  Yes Rogelia Mire, NP  ranitidine (ZANTAC) 75 MG tablet Take 75 mg by mouth 2 (two) times daily as needed.    Yes Historical Provider, MD  sertraline (ZOLOFT) 100 MG tablet Take 100 mg by mouth at bedtime.    Yes Historical Provider, MD  traMADol (ULTRAM) 50 MG tablet Take 50 mg by mouth every 12 (twelve) hours as needed for moderate pain.    Yes Historical Provider, MD      Review of Systems  Constitutional: Positive for fatigue. Negative for appetite change.  HENT: Negative for congestion, postnasal drip and sore throat.   Eyes: Negative.   Respiratory: Positive for shortness of breath. Negative for cough, chest tightness and wheezing.   Cardiovascular: Positive for leg swelling (trace amount in right leg). Negative for chest pain and palpitations.  Gastrointestinal: Negative for abdominal pain and abdominal distention.  Endocrine: Negative.   Genitourinary: Positive for frequency. Negative for dysuria.  Musculoskeletal: Positive for arthralgias (left shoulder). Negative for back pain.  Skin: Negative.   Allergic/Immunologic: Negative.   Neurological: Negative for dizziness, light-headedness and headaches.       Bilateral leg cramps for the last 3 weeks   Hematological: Negative for adenopathy. Bruises/bleeds easily.  Psychiatric/Behavioral: Positive for sleep disturbance (sleeping on 1 pillow; not sleeping well due to leg cramps). Negative for dysphoric mood. The patient is not nervous/anxious.        Objective:   Physical Exam  Constitutional: She is oriented to person, place, and time. She appears  well-developed and well-nourished.  HENT:  Head: Normocephalic and atraumatic.  Eyes: Conjunctivae are normal. Pupils are equal, round, and reactive to light.  Neck: Normal range of motion. Neck supple.  Cardiovascular: Regular rhythm.  Bradycardia present.   Murmur heard. Pulmonary/Chest: Effort normal. She has no wheezes. She has no rales.  Abdominal: Soft. She exhibits no distension. There is no tenderness.  Musculoskeletal: She exhibits edema (trace amount of pitting edema around right ankle). She exhibits no tenderness.  Neurological: She is alert and oriented to person, place, and time.  Skin: Skin is warm and dry.  Psychiatric: She has a normal mood and affect. Her behavior is normal. Thought content normal.  Nursing note and vitals reviewed.   BP 138/68 mmHg  Pulse 52  Resp 20  Ht 5\' 1"  (1.549 m)  Wt 142 lb (64.411 kg)  BMI 26.84 kg/m2  SpO2 99%       Assessment & Plan:  1: Chronic heart failure with reduced ejection fraction- Patient presents with fatigue and shortness of breath upon exertion. She came into the office in a wheelchair  because she says that it would have been too far for her to walk. She is already weighing herself daily and says that her weight has been stable. Advised her to call for an overnight weight gain of >2 pounds or a weekly weight gain of >5 pounds. She does have some slight swelling in her right foot but she says that it's always like that due to her decreased circulation in her right leg. She is not adding any salt to her food and says that she tends to use no-salt. Her daughter is currently doing the grocery shopping and does look at food labels. Discussed the importance of following a 2000mg  sodium diet and written information was given to her about that. [place] PhamD went in and reviewed medications with the patient. 2: HTN- Blood pressure looks good today. Could consider changing her lisinopril to entresto in the future.  3: Cramping in lower  extremities- She says that she's been having constant cramping in both of her lower legs with the right being worse than the left for the last 3 weeks. She says that she's supposed to get lab work drawn for her cardiologist. Called Dr. Pennie Rushing office and offered to order the labs so that she could go to pre-admit testing instead of going all the way to the hospital. He was agreeable and a BMP and magnesium level were ordered per his request. Will fax results when we get them back today.  4: Bradycardia- Heart rate in the 50's today. Currently taking 6.25mg  carvedilol twice daily. May need to decrease it should her heart rate remain low.  Patient did not bring her medications nor a list. Each medication was verbally reviewed with the patient and she was encouraged to bring the bottles to every visit to confirm accuracy of list.  Return in 1 month or sooner for any questions/problems before then.

## 2015-12-03 DIAGNOSIS — R269 Unspecified abnormalities of gait and mobility: Secondary | ICD-10-CM | POA: Diagnosis not present

## 2015-12-03 DIAGNOSIS — E785 Hyperlipidemia, unspecified: Secondary | ICD-10-CM | POA: Diagnosis not present

## 2015-12-03 DIAGNOSIS — I272 Other secondary pulmonary hypertension: Secondary | ICD-10-CM | POA: Diagnosis not present

## 2015-12-03 DIAGNOSIS — I214 Non-ST elevation (NSTEMI) myocardial infarction: Secondary | ICD-10-CM | POA: Diagnosis not present

## 2015-12-03 DIAGNOSIS — I5042 Chronic combined systolic (congestive) and diastolic (congestive) heart failure: Secondary | ICD-10-CM | POA: Diagnosis not present

## 2015-12-03 DIAGNOSIS — I130 Hypertensive heart and chronic kidney disease with heart failure and stage 1 through stage 4 chronic kidney disease, or unspecified chronic kidney disease: Secondary | ICD-10-CM | POA: Diagnosis not present

## 2015-12-03 DIAGNOSIS — J449 Chronic obstructive pulmonary disease, unspecified: Secondary | ICD-10-CM | POA: Diagnosis not present

## 2015-12-03 DIAGNOSIS — I255 Ischemic cardiomyopathy: Secondary | ICD-10-CM | POA: Diagnosis not present

## 2015-12-03 DIAGNOSIS — I429 Cardiomyopathy, unspecified: Secondary | ICD-10-CM | POA: Diagnosis not present

## 2015-12-03 DIAGNOSIS — N183 Chronic kidney disease, stage 3 (moderate): Secondary | ICD-10-CM | POA: Diagnosis not present

## 2015-12-03 DIAGNOSIS — I5022 Chronic systolic (congestive) heart failure: Secondary | ICD-10-CM | POA: Diagnosis not present

## 2015-12-05 ENCOUNTER — Other Ambulatory Visit: Payer: Self-pay

## 2015-12-05 ENCOUNTER — Other Ambulatory Visit: Payer: Self-pay | Admitting: *Deleted

## 2015-12-05 DIAGNOSIS — I255 Ischemic cardiomyopathy: Secondary | ICD-10-CM | POA: Diagnosis not present

## 2015-12-05 DIAGNOSIS — I5022 Chronic systolic (congestive) heart failure: Secondary | ICD-10-CM | POA: Diagnosis not present

## 2015-12-05 DIAGNOSIS — I214 Non-ST elevation (NSTEMI) myocardial infarction: Secondary | ICD-10-CM | POA: Diagnosis not present

## 2015-12-05 DIAGNOSIS — J449 Chronic obstructive pulmonary disease, unspecified: Secondary | ICD-10-CM | POA: Diagnosis not present

## 2015-12-05 DIAGNOSIS — N183 Chronic kidney disease, stage 3 (moderate): Secondary | ICD-10-CM | POA: Diagnosis not present

## 2015-12-05 DIAGNOSIS — I272 Other secondary pulmonary hypertension: Secondary | ICD-10-CM | POA: Diagnosis not present

## 2015-12-05 DIAGNOSIS — E785 Hyperlipidemia, unspecified: Secondary | ICD-10-CM | POA: Diagnosis not present

## 2015-12-05 DIAGNOSIS — I130 Hypertensive heart and chronic kidney disease with heart failure and stage 1 through stage 4 chronic kidney disease, or unspecified chronic kidney disease: Secondary | ICD-10-CM | POA: Diagnosis not present

## 2015-12-05 MED ORDER — LISINOPRIL 40 MG PO TABS: 40.0000 mg | ORAL_TABLET | Freq: Every day | ORAL | Status: DC

## 2015-12-05 NOTE — Telephone Encounter (Signed)
Error

## 2015-12-09 ENCOUNTER — Telehealth: Payer: Self-pay | Admitting: Cardiovascular Disease

## 2015-12-09 DIAGNOSIS — I130 Hypertensive heart and chronic kidney disease with heart failure and stage 1 through stage 4 chronic kidney disease, or unspecified chronic kidney disease: Secondary | ICD-10-CM | POA: Diagnosis not present

## 2015-12-09 DIAGNOSIS — I255 Ischemic cardiomyopathy: Secondary | ICD-10-CM | POA: Diagnosis not present

## 2015-12-09 DIAGNOSIS — E785 Hyperlipidemia, unspecified: Secondary | ICD-10-CM | POA: Diagnosis not present

## 2015-12-09 DIAGNOSIS — I5022 Chronic systolic (congestive) heart failure: Secondary | ICD-10-CM | POA: Diagnosis not present

## 2015-12-09 DIAGNOSIS — I214 Non-ST elevation (NSTEMI) myocardial infarction: Secondary | ICD-10-CM | POA: Diagnosis not present

## 2015-12-09 DIAGNOSIS — J449 Chronic obstructive pulmonary disease, unspecified: Secondary | ICD-10-CM | POA: Diagnosis not present

## 2015-12-09 DIAGNOSIS — N183 Chronic kidney disease, stage 3 (moderate): Secondary | ICD-10-CM | POA: Diagnosis not present

## 2015-12-09 DIAGNOSIS — I272 Other secondary pulmonary hypertension: Secondary | ICD-10-CM | POA: Diagnosis not present

## 2015-12-09 NOTE — Telephone Encounter (Signed)
[facility] calling  Stating they have not been able to progress with  Having a lot more cramping Can't walk due to that pain Would like to know what can be done to help with that. Please advise

## 2015-12-09 NOTE — Telephone Encounter (Signed)
Attempted to call pt x 2 times. No answer. Recording states "person has a voice mailbox that has not yet been set up" then disconnects. Will call again.

## 2015-12-09 NOTE — Telephone Encounter (Signed)
Pt sees Dr. Fletcher Anon.

## 2015-12-10 ENCOUNTER — Other Ambulatory Visit: Payer: Self-pay

## 2015-12-10 ENCOUNTER — Other Ambulatory Visit: Payer: Self-pay | Admitting: Cardiovascular Disease

## 2015-12-10 DIAGNOSIS — E785 Hyperlipidemia, unspecified: Secondary | ICD-10-CM | POA: Diagnosis not present

## 2015-12-10 DIAGNOSIS — J449 Chronic obstructive pulmonary disease, unspecified: Secondary | ICD-10-CM | POA: Diagnosis not present

## 2015-12-10 DIAGNOSIS — I255 Ischemic cardiomyopathy: Secondary | ICD-10-CM | POA: Diagnosis not present

## 2015-12-10 DIAGNOSIS — R252 Cramp and spasm: Secondary | ICD-10-CM

## 2015-12-10 DIAGNOSIS — I5022 Chronic systolic (congestive) heart failure: Secondary | ICD-10-CM | POA: Diagnosis not present

## 2015-12-10 DIAGNOSIS — I130 Hypertensive heart and chronic kidney disease with heart failure and stage 1 through stage 4 chronic kidney disease, or unspecified chronic kidney disease: Secondary | ICD-10-CM | POA: Diagnosis not present

## 2015-12-10 DIAGNOSIS — I214 Non-ST elevation (NSTEMI) myocardial infarction: Secondary | ICD-10-CM | POA: Diagnosis not present

## 2015-12-10 DIAGNOSIS — I272 Other secondary pulmonary hypertension: Secondary | ICD-10-CM | POA: Diagnosis not present

## 2015-12-10 DIAGNOSIS — N183 Chronic kidney disease, stage 3 (moderate): Secondary | ICD-10-CM | POA: Diagnosis not present

## 2015-12-10 NOTE — Telephone Encounter (Signed)
S/w  pt on her sister's cell number. Pt reports continued bilateral leg cramping, more in the right leg than left with moderate right ankle and foot swelling. . Reports some right foot numbness but states "I was born that way" and this is normal. She thinks leg cramping is related to March 31 fall. She states nothing makes it better or worse. Potassium 4.3 on April 18.  CT hip and xray of femur results appear to be negative for fracture.  Physical therapy called stating pt unable to progress as she is unable to ambulate d/t cramping.  She has no local PCP as she is from [place] and visiting family when she was admitted to [facility] for NSTEMI.  Will forward to MD to review and advise.

## 2015-12-10 NOTE — Telephone Encounter (Signed)
Schedule lower extremity arterial doppler to check her circulation and refer her to [facility] to establish.

## 2015-12-10 NOTE — Telephone Encounter (Signed)
S/w Felicia at [place] for LE arterial doppler at [facility] as we do not have any office openings for two weeks. Scheduled for April 28, 3:30pm, [facility] entrance. Scheduled PCP appt May 3, 10:30am with Dr. Thersa Salt at [facility]  S/w pt regarding recommendations and appts. Pt is agreeable w/plan and asks I give appt information to her sister.  Reviewed with pt sister, Katherine [name], who wrote appt times, dates, and locations down and repeated back to me. She verbalized understanding with no further questions.

## 2015-12-11 ENCOUNTER — Other Ambulatory Visit: Payer: Self-pay

## 2015-12-11 DIAGNOSIS — E785 Hyperlipidemia, unspecified: Secondary | ICD-10-CM | POA: Diagnosis not present

## 2015-12-11 DIAGNOSIS — J449 Chronic obstructive pulmonary disease, unspecified: Secondary | ICD-10-CM | POA: Diagnosis not present

## 2015-12-11 DIAGNOSIS — I255 Ischemic cardiomyopathy: Secondary | ICD-10-CM | POA: Diagnosis not present

## 2015-12-11 DIAGNOSIS — I272 Other secondary pulmonary hypertension: Secondary | ICD-10-CM | POA: Diagnosis not present

## 2015-12-11 DIAGNOSIS — I5022 Chronic systolic (congestive) heart failure: Secondary | ICD-10-CM | POA: Diagnosis not present

## 2015-12-11 DIAGNOSIS — I214 Non-ST elevation (NSTEMI) myocardial infarction: Secondary | ICD-10-CM | POA: Diagnosis not present

## 2015-12-11 DIAGNOSIS — N183 Chronic kidney disease, stage 3 (moderate): Secondary | ICD-10-CM | POA: Diagnosis not present

## 2015-12-11 DIAGNOSIS — I130 Hypertensive heart and chronic kidney disease with heart failure and stage 1 through stage 4 chronic kidney disease, or unspecified chronic kidney disease: Secondary | ICD-10-CM | POA: Diagnosis not present

## 2015-12-12 ENCOUNTER — Ambulatory Visit
Admission: RE | Admit: 2015-12-12 | Discharge: 2015-12-12 | Disposition: A | Discharge status: Home / Self Care | Payer: Medicare HMO | Source: Ambulatory Visit | Attending: Cardiovascular Disease | Admitting: Cardiovascular Disease

## 2015-12-12 DIAGNOSIS — M79605 Pain in left leg: Secondary | ICD-10-CM | POA: Diagnosis not present

## 2015-12-12 DIAGNOSIS — R252 Cramp and spasm: Secondary | ICD-10-CM | POA: Diagnosis not present

## 2015-12-12 DIAGNOSIS — M79604 Pain in right leg: Secondary | ICD-10-CM | POA: Diagnosis not present

## 2015-12-15 ENCOUNTER — Ambulatory Visit: Payer: Medicare HMO | Admitting: Cardiology

## 2015-12-15 ENCOUNTER — Other Ambulatory Visit: Payer: Self-pay | Admitting: *Deleted

## 2015-12-15 ENCOUNTER — Telehealth: Payer: Self-pay | Admitting: Cardiovascular Disease

## 2015-12-15 ENCOUNTER — Other Ambulatory Visit: Payer: Self-pay

## 2015-12-15 ENCOUNTER — Other Ambulatory Visit: Payer: Self-pay | Admitting: Cardiovascular Disease

## 2015-12-15 DIAGNOSIS — M79662 Pain in left lower leg: Principal | ICD-10-CM

## 2015-12-15 DIAGNOSIS — M79661 Pain in right lower leg: Secondary | ICD-10-CM

## 2015-12-15 DIAGNOSIS — M79606 Pain in leg, unspecified: Secondary | ICD-10-CM

## 2015-12-15 DIAGNOSIS — R252 Cramp and spasm: Secondary | ICD-10-CM

## 2015-12-15 MED ORDER — AMLODIPINE BESYLATE 10 MG PO TABS: 10.0000 mg | ORAL_TABLET | Freq: Every day | ORAL | Status: DC

## 2015-12-15 NOTE — Telephone Encounter (Signed)
Requested Prescriptions   Signed Prescriptions Disp Refills  . amLODipine (NORVASC) 10 MG tablet 90 tablet 3    Sig: Take 1 tablet (10 mg total) by mouth at bedtime.    Authorizing Provider: Kathlyn Sacramento A    Ordering User: Britt Bottom

## 2015-12-15 NOTE — Telephone Encounter (Signed)
Amlodipine 10 mg R#90 #3 sent [employer] [facility] [employer] and #30 R#1 sent to Local [facility] [place].

## 2015-12-15 NOTE — Telephone Encounter (Signed)
°*  STAT* If patient is at the pharmacy, call can be transferred to refill team.  1. Which medications need to be refilled? (please list name of each medication and dose if known) Amlodipine 10 mg po Daily  HS   2. Which pharmacy/location (including street and city if local pharmacy) is medication to be sent to? [facility] [place] [facility]  3. Do they need a 30 day or 90 day supply? [facility] then [facility] day [facility]   PATIENT IS OUT OF MEDS AND NEEDS [facility] TODAY AND 90 DAY SENT TO [facility]

## 2015-12-16 ENCOUNTER — Other Ambulatory Visit: Payer: Self-pay | Admitting: Cardiovascular Disease

## 2015-12-16 ENCOUNTER — Other Ambulatory Visit: Payer: Medicare HMO

## 2015-12-16 ENCOUNTER — Encounter (INDEPENDENT_AMBULATORY_CARE_PROVIDER_SITE_OTHER): Payer: Self-pay

## 2015-12-16 ENCOUNTER — Other Ambulatory Visit: Payer: Self-pay | Admitting: Nurse Practitioner

## 2015-12-16 ENCOUNTER — Other Ambulatory Visit (INDEPENDENT_AMBULATORY_CARE_PROVIDER_SITE_OTHER): Payer: Medicare HMO

## 2015-12-16 ENCOUNTER — Ambulatory Visit: Payer: Medicare HMO

## 2015-12-16 DIAGNOSIS — I214 Non-ST elevation (NSTEMI) myocardial infarction: Secondary | ICD-10-CM

## 2015-12-16 DIAGNOSIS — M79605 Pain in left leg: Principal | ICD-10-CM

## 2015-12-16 DIAGNOSIS — I255 Ischemic cardiomyopathy: Secondary | ICD-10-CM

## 2015-12-16 DIAGNOSIS — I272 Other secondary pulmonary hypertension: Secondary | ICD-10-CM | POA: Diagnosis not present

## 2015-12-16 DIAGNOSIS — E785 Hyperlipidemia, unspecified: Secondary | ICD-10-CM | POA: Diagnosis not present

## 2015-12-16 DIAGNOSIS — I5022 Chronic systolic (congestive) heart failure: Secondary | ICD-10-CM | POA: Diagnosis not present

## 2015-12-16 DIAGNOSIS — I119 Hypertensive heart disease without heart failure: Secondary | ICD-10-CM

## 2015-12-16 DIAGNOSIS — M79604 Pain in right leg: Secondary | ICD-10-CM | POA: Diagnosis not present

## 2015-12-16 DIAGNOSIS — I130 Hypertensive heart and chronic kidney disease with heart failure and stage 1 through stage 4 chronic kidney disease, or unspecified chronic kidney disease: Secondary | ICD-10-CM | POA: Diagnosis not present

## 2015-12-16 DIAGNOSIS — M79661 Pain in right lower leg: Secondary | ICD-10-CM

## 2015-12-16 DIAGNOSIS — M79662 Pain in left lower leg: Principal | ICD-10-CM

## 2015-12-16 DIAGNOSIS — J449 Chronic obstructive pulmonary disease, unspecified: Secondary | ICD-10-CM | POA: Diagnosis not present

## 2015-12-16 DIAGNOSIS — N183 Chronic kidney disease, stage 3 (moderate): Secondary | ICD-10-CM | POA: Diagnosis not present

## 2015-12-17 ENCOUNTER — Ambulatory Visit: Payer: Medicare HMO | Admitting: Family Medicine

## 2015-12-17 LAB — LIPID PANEL
CHOLESTEROL TOTAL: 114 mg/dL (ref 100–199)
Chol/HDL Ratio: 2.8 ratio units (ref 0.0–4.4)
HDL: 41 mg/dL (ref >39)
LDL Calculated: 48 mg/dL (ref 0–99)
Triglycerides: 126 mg/dL (ref 0–149)
VLDL Cholesterol Cal: 25 mg/dL (ref 5–40)

## 2015-12-17 LAB — BASIC METABOLIC PANEL
BUN / CREAT RATIO: 23 (ref 12–28)
BUN: 33 mg/dL — ABNORMAL HIGH (ref 8–27)
CO2: 25 mmol/L (ref 18–29)
Calcium: 9.3 mg/dL (ref 8.7–10.3)
Chloride: 101 mmol/L (ref 96–106)
Creatinine, Ser: 1.46 mg/dL — ABNORMAL HIGH (ref 0.57–1.00)
GFR, EST AFRICAN AMERICAN: 38 mL/min/{1.73_m2} — AB (ref >59)
GFR, EST NON AFRICAN AMERICAN: 33 mL/min/{1.73_m2} — AB (ref >59)
Glucose: 108 mg/dL — ABNORMAL HIGH (ref 65–99)
Potassium: 5.6 mmol/L — ABNORMAL HIGH (ref 3.5–5.2)
SODIUM: 144 mmol/L (ref 134–144)

## 2015-12-17 LAB — HEPATIC FUNCTION PANEL
ALBUMIN: 3.9 g/dL (ref 3.5–4.7)
ALK PHOS: 111 IU/L (ref 39–117)
ALT: 15 IU/L (ref 0–32)
AST: 29 IU/L (ref 0–40)
BILIRUBIN TOTAL: 0.3 mg/dL (ref 0.0–1.2)
BILIRUBIN, DIRECT: 0.09 mg/dL (ref 0.00–0.40)
TOTAL PROTEIN: 6.3 g/dL (ref 6.0–8.5)

## 2015-12-18 ENCOUNTER — Other Ambulatory Visit: Payer: Self-pay

## 2015-12-18 DIAGNOSIS — I255 Ischemic cardiomyopathy: Secondary | ICD-10-CM | POA: Diagnosis not present

## 2015-12-18 DIAGNOSIS — J449 Chronic obstructive pulmonary disease, unspecified: Secondary | ICD-10-CM | POA: Diagnosis not present

## 2015-12-18 DIAGNOSIS — I5022 Chronic systolic (congestive) heart failure: Secondary | ICD-10-CM | POA: Diagnosis not present

## 2015-12-18 DIAGNOSIS — I272 Other secondary pulmonary hypertension: Secondary | ICD-10-CM | POA: Diagnosis not present

## 2015-12-18 DIAGNOSIS — I130 Hypertensive heart and chronic kidney disease with heart failure and stage 1 through stage 4 chronic kidney disease, or unspecified chronic kidney disease: Secondary | ICD-10-CM | POA: Diagnosis not present

## 2015-12-18 DIAGNOSIS — N183 Chronic kidney disease, stage 3 (moderate): Secondary | ICD-10-CM | POA: Diagnosis not present

## 2015-12-18 DIAGNOSIS — E785 Hyperlipidemia, unspecified: Secondary | ICD-10-CM | POA: Diagnosis not present

## 2015-12-18 DIAGNOSIS — E875 Hyperkalemia: Secondary | ICD-10-CM

## 2015-12-18 DIAGNOSIS — I214 Non-ST elevation (NSTEMI) myocardial infarction: Secondary | ICD-10-CM | POA: Diagnosis not present

## 2015-12-18 MED ORDER — LISINOPRIL 20 MG PO TABS: 20.0000 mg | ORAL_TABLET | Freq: Every day | ORAL | Status: DC

## 2015-12-18 MED ORDER — FUROSEMIDE 20 MG PO TABS: 20.0000 mg | ORAL_TABLET | ORAL | Status: DC

## 2015-12-22 ENCOUNTER — Other Ambulatory Visit: Payer: Medicare HMO

## 2015-12-23 DIAGNOSIS — N183 Chronic kidney disease, stage 3 (moderate): Secondary | ICD-10-CM | POA: Diagnosis not present

## 2015-12-23 DIAGNOSIS — I130 Hypertensive heart and chronic kidney disease with heart failure and stage 1 through stage 4 chronic kidney disease, or unspecified chronic kidney disease: Secondary | ICD-10-CM | POA: Diagnosis not present

## 2015-12-23 DIAGNOSIS — I272 Other secondary pulmonary hypertension: Secondary | ICD-10-CM | POA: Diagnosis not present

## 2015-12-23 DIAGNOSIS — E785 Hyperlipidemia, unspecified: Secondary | ICD-10-CM | POA: Diagnosis not present

## 2015-12-23 DIAGNOSIS — I255 Ischemic cardiomyopathy: Secondary | ICD-10-CM | POA: Diagnosis not present

## 2015-12-23 DIAGNOSIS — J449 Chronic obstructive pulmonary disease, unspecified: Secondary | ICD-10-CM | POA: Diagnosis not present

## 2015-12-23 DIAGNOSIS — I5022 Chronic systolic (congestive) heart failure: Secondary | ICD-10-CM | POA: Diagnosis not present

## 2015-12-23 DIAGNOSIS — I214 Non-ST elevation (NSTEMI) myocardial infarction: Secondary | ICD-10-CM | POA: Diagnosis not present

## 2015-12-25 DIAGNOSIS — I272 Other secondary pulmonary hypertension: Secondary | ICD-10-CM | POA: Diagnosis not present

## 2015-12-25 DIAGNOSIS — E785 Hyperlipidemia, unspecified: Secondary | ICD-10-CM | POA: Diagnosis not present

## 2015-12-25 DIAGNOSIS — I130 Hypertensive heart and chronic kidney disease with heart failure and stage 1 through stage 4 chronic kidney disease, or unspecified chronic kidney disease: Secondary | ICD-10-CM | POA: Diagnosis not present

## 2015-12-25 DIAGNOSIS — J449 Chronic obstructive pulmonary disease, unspecified: Secondary | ICD-10-CM | POA: Diagnosis not present

## 2015-12-25 DIAGNOSIS — I255 Ischemic cardiomyopathy: Secondary | ICD-10-CM | POA: Diagnosis not present

## 2015-12-25 DIAGNOSIS — I214 Non-ST elevation (NSTEMI) myocardial infarction: Secondary | ICD-10-CM | POA: Diagnosis not present

## 2015-12-25 DIAGNOSIS — N183 Chronic kidney disease, stage 3 (moderate): Secondary | ICD-10-CM | POA: Diagnosis not present

## 2015-12-25 DIAGNOSIS — I5022 Chronic systolic (congestive) heart failure: Secondary | ICD-10-CM | POA: Diagnosis not present

## 2015-12-26 ENCOUNTER — Encounter: Payer: Self-pay | Admitting: Cardiovascular Disease

## 2015-12-26 ENCOUNTER — Ambulatory Visit (INDEPENDENT_AMBULATORY_CARE_PROVIDER_SITE_OTHER): Payer: Medicare HMO | Admitting: Cardiovascular Disease

## 2015-12-26 VITALS — BP 122/64 | HR 54 | Ht 61.0 in | Wt 143.0 lb

## 2015-12-26 DIAGNOSIS — R0789 Other chest pain: Secondary | ICD-10-CM

## 2015-12-26 DIAGNOSIS — I509 Heart failure, unspecified: Secondary | ICD-10-CM | POA: Diagnosis not present

## 2015-12-26 DIAGNOSIS — E875 Hyperkalemia: Secondary | ICD-10-CM

## 2015-12-26 MED ORDER — AMLODIPINE BESYLATE 5 MG PO TABS: 5.0000 mg | ORAL_TABLET | Freq: Every day | ORAL | Status: DC

## 2015-12-26 NOTE — Progress Notes (Signed)
Cardiology Office Note   Date:  12/26/2015   ID:  Katherine [name], DOB 1929/12/27, MRN [id number]  PCP:  No PCP Per Patient  Cardiologist:   Kathlyn Sacramento, MD   Chief Complaint  Patient presents with  . Other    C/o chest discomfort after mopping. Meds reviewed verbally.      History of Present Illness: Katherine [name] is a 82 y.o. female who presents for A follow-up visit regarding chronic systolic heart failure and coronary artery disease. She has a history of remote myocardial infarction, CHF, chronic kidney disease, and COPD. She is from [place] but visiting in the [place] area with her sister currently. She was recently evaluated at the [facility] emergency room secondary to chest pain and shortness of breath with subsequent finding of mild troponin elevation and creatinine of 1.56. She underwent stress testing revealing anterior scar with minimal ischemia and severe LV dysfunction.   On March 15, she developed recurrent chest pain radiating to her neck, associated with nausea, vomiting, and dyspnea. She presented to the [facility] emergency department where she had non-ST elevation MI peak troponin 3.22.She underwent diagnostic catheterization on March 16 revealing a chronic total occlusion of the LAD with moderate left circumflex disease and otherwise minor irregularities. Fractional flow reserve was performed within the left circumflex and was normal at 0.92. Medical therapy was recommended. EF was 35% and right heart catheterization revealed pulmonary hypertension and elevated filling pressures with a pulmonary capillary wedge pressure of 22. She was treated with 1 dose of IV Lasix and this was subsequently transitioned to oral Lasix. We also titrated beta blocker therapy while adding ACE inhibitor and weaning off her previous home dose of clonidine.   Echocardiogram showed an EF 35-40% with grade 1 diastolic dysfunction.   She was rehospitalized on March 31 at [facility] after she  presented with a fall. She was found to have acute renal failure with a creatinine of 2. This improved with gentle hydration. She bruised her right hip but there was no evidence of fracture.  She has been doing reasonably well but complains of cramps in both legs especially on the right side. It is located in both hips. We did an ABI which was close to normal bilaterally with no significant CAD. Follow-up labs showed mild volume depletion with hyperkalemia and thus the dose of furosemide was changed to 20 mg every other day and lisinopril was decreased to 20 mg once daily. She has been doing reasonably well.   Past Medical History  Diagnosis Date  . Chronic combined systolic and diastolic heart failure (HCC)     EF 35-40%, Akinesis of mid-apicalanteroseptal wall & AK of apex, Gr 1DD.  Marland Kitchen Hypertensive heart disease   . COPD (chronic obstructive pulmonary disease) ([place])     worked in a [employer]  . Ischemic cardiomyopathy   . CKD (chronic kidney disease), stage III   . Occlusive coronary artery disease requiring drug therapy     a. Remote MI - never sought treatment when it occurred, shows up old on EKG;  b. Cath 10/2015 - 100% pLAD CTO with 80% D1, prox Cx 60% - EF ~35%  . Fibromyalgia     Past Surgical History  Procedure Laterality Date  . Shoulder surgery    . Cardiac catheterization N/A 10/30/2015    Procedure: Right/Left Heart Cath and Coronary Angiography;  Surgeon: Wellington Hampshire, MD;  Location: [place] [facility];  Service: Cardiovascular;  Laterality: N/A;  .  Cardiac catheterization N/A 10/30/2015    Procedure: Intravascular Pressure Wire/FFR Study;  Surgeon: Wellington Hampshire, MD;  Location: [place] CV LAB;  Service: Cardiovascular;  Laterality: N/A;     Current Outpatient Prescriptions  Medication Sig Dispense Refill  . aspirin EC 81 MG tablet Take 81 mg by mouth daily.    Marland Kitchen atorvastatin (LIPITOR) 80 MG tablet Take 1 tablet (80 mg total) by mouth daily at 6 PM. 90 tablet 3  .  carvedilol (COREG) 6.25 MG tablet Take 1 tablet (6.25 mg total) by mouth 2 (two) times daily with a meal. 180 tablet 3  . clopidogrel (PLAVIX) 75 MG tablet Take 1 tablet (75 mg total) by mouth daily. 30 tablet 3  . furosemide (LASIX) 20 MG tablet Take 1 tablet (20 mg total) by mouth every other day. 30 tablet 3  . gabapentin (NEURONTIN) 100 MG capsule Take 200 mg by mouth 3 (three) times daily.    Marland Kitchen HYDROcodone-acetaminophen (NORCO/VICODIN) 5-325 MG tablet Take 0.5-1 tablets by mouth daily as needed for moderate pain.    Marland Kitchen lisinopril (PRINIVIL,ZESTRIL) 20 MG tablet Take 1 tablet (20 mg total) by mouth daily. 30 tablet 3  . nitroGLYCERIN (NITROSTAT) 0.4 MG SL tablet Place 1 tablet (0.4 mg total) under the tongue every 5 (five) minutes as needed for chest pain. 25 tablet 3  . Omega-3 Fatty Acids (FISH OIL) 1000 MG CAPS Take 1,000 mg by mouth daily.    . pantoprazole (PROTONIX) 40 MG tablet Take 1 tablet (40 mg total) by mouth daily. 30 tablet 3  . ranitidine (ZANTAC) 75 MG tablet Take 75 mg by mouth 2 (two) times daily as needed.     . sertraline (ZOLOFT) 100 MG tablet Take 100 mg by mouth at bedtime.     . traMADol (ULTRAM) 50 MG tablet Take 50 mg by mouth every 12 (twelve) hours as needed for moderate pain.     Marland Kitchen amLODipine (NORVASC) 5 MG tablet Take 1 tablet (5 mg total) by mouth daily. 30 tablet 5   No current facility-administered medications for this visit.    Allergies:   Review of patient's allergies indicates no known allergies.    Social History:  The patient  reports that she has never smoked. She has never used smokeless tobacco. She reports that she does not drink alcohol or use illicit drugs.   Family History:  The patient's family history includes CAD in her brother and sister; Cancer in her mother.    ROS:  Please see the history of present illness.   Otherwise, review of systems are positive for none.   All other systems are reviewed and negative.    PHYSICAL EXAM: VS:  BP  122/64 mmHg  Pulse 54  Ht 5\' 1"  (1.549 m)  Wt 143 lb (64.864 kg)  BMI 27.03 kg/m2 , BMI Body mass index is 27.03 kg/(m^2). GEN: Well nourished, well developed, in no acute distress HEENT: normal Neck: no JVD, carotid bruits, or masses Cardiac: RRR; no rubs, or gallops, . There is one out of 6 systolic ejection murmur in the aortic area. +1 edema. Respiratory:  clear to auscultation bilaterally, normal work of breathing GI: soft, nontender, nondistended, + BS MS: no deformity or atrophy Skin: warm and dry, no rash Neuro:  Strength and sensation are intact Psych: euthymic mood, full affect   EKG:  EKG is ordered today. The ekg ordered today demonstrates  normal sinus rhythm with nonspecific ST and T wave changes.   Recent  Labs: 10/30/2015: B Natriuretic Peptide 1649.7*; TSH 0.345* 11/15/2015: Hemoglobin 8.9*; Platelets 139* 12/02/2015: Magnesium 2.3 12/16/2015: ALT 15; BUN 33*; Creatinine, Ser 1.46*; Potassium 5.6*; Sodium 144    Lipid Panel    Component Value Date/Time   CHOL 114 12/16/2015 1422   CHOL 186 10/30/2015 0855   TRIG 126 12/16/2015 1422   HDL 41 12/16/2015 1422   HDL 40* 10/30/2015 0855   CHOLHDL 2.8 12/16/2015 1422   CHOLHDL 4.7 10/30/2015 0855   VLDL 28 10/30/2015 0855   LDLCALC 48 12/16/2015 1422   LDLCALC 118* 10/30/2015 0855      Wt Readings from Last 3 Encounters:  12/26/15 143 lb (64.864 kg)  12/02/15 142 lb (64.411 kg)  11/21/15 149 lb (67.586 kg)        ASSESSMENT AND PLAN:  1.  Chronic systolic heart failure: The patient appears to be euvolemic on small dose furosemide. She developed hyperkalemia and the dose of Lisinopril was decreased. I'm going to repeat basic metabolic profile today I don't think we would be able to have spironolactone for that reason. We can consider switching lisinopril to [facility].    2. Coronary artery disease involving native coronary arteries without angina: She has chronically occluded LAD with collaterals and  moderate left circumflex stenosis which was not significant by FFR. She is currently on dual antiplatelet therapy.  3. Essential hypertension:  She now reports intermittent episodes of hypotension. I decreased amlodipine to 5 mg once daily.  4. Hyperlipidemia: Continue high dose atorvastatin. Most recent lipid profile showed an LDL of 48.  5. Leg cramps: Seems to be due to arthritis. No evidence of significant peripheral arterial disease.    Disposition:   FU with me in 3 months  Signed,  Kathlyn Sacramento, MD  12/26/2015 5:49 PM    [place]

## 2015-12-26 NOTE — Patient Instructions (Signed)
Medication Instructions:  Your physician has recommended you make the following change in your medication:  DECREASE amlodipine to 5mg  once daily   Labwork: BMET  Testing/Procedures: none  Follow-Up: Your physician recommends that you schedule a follow-up appointment in: 3 months with Dr. Fletcher Anon.    Any Other Special Instructions Will Be Listed Below (If Applicable).     If you need a refill on your cardiac medications before your next appointment, please call your pharmacy.

## 2015-12-27 LAB — BASIC METABOLIC PANEL
BUN/Creatinine Ratio: 21 (ref 12–28)
BUN: 22 mg/dL (ref 8–27)
CALCIUM: 9.6 mg/dL (ref 8.7–10.3)
CHLORIDE: 103 mmol/L (ref 96–106)
CO2: 27 mmol/L (ref 18–29)
Creatinine, Ser: 1.04 mg/dL — ABNORMAL HIGH (ref 0.57–1.00)
GFR calc non Af Amer: 50 mL/min/{1.73_m2} — ABNORMAL LOW (ref >59)
GFR, EST AFRICAN AMERICAN: 57 mL/min/{1.73_m2} — AB (ref >59)
Glucose: 109 mg/dL — ABNORMAL HIGH (ref 65–99)
POTASSIUM: 5 mmol/L (ref 3.5–5.2)
SODIUM: 143 mmol/L (ref 134–144)

## 2015-12-31 ENCOUNTER — Ambulatory Visit: Payer: Medicare HMO | Admitting: Family

## 2016-01-05 ENCOUNTER — Emergency Department: Payer: Medicare HMO

## 2016-01-05 ENCOUNTER — Emergency Department
Admission: EM | Admit: 2016-01-05 | Discharge: 2016-01-05 | Disposition: A | Discharge status: Home / Self Care | Payer: Medicare HMO | Attending: Emergency Medicine | Admitting: Emergency Medicine

## 2016-01-05 DIAGNOSIS — M25552 Pain in left hip: Secondary | ICD-10-CM | POA: Diagnosis not present

## 2016-01-05 DIAGNOSIS — E785 Hyperlipidemia, unspecified: Secondary | ICD-10-CM | POA: Insufficient documentation

## 2016-01-05 DIAGNOSIS — N183 Chronic kidney disease, stage 3 (moderate): Secondary | ICD-10-CM | POA: Insufficient documentation

## 2016-01-05 DIAGNOSIS — M25551 Pain in right hip: Secondary | ICD-10-CM | POA: Diagnosis not present

## 2016-01-05 DIAGNOSIS — I252 Old myocardial infarction: Secondary | ICD-10-CM | POA: Diagnosis not present

## 2016-01-05 DIAGNOSIS — J449 Chronic obstructive pulmonary disease, unspecified: Secondary | ICD-10-CM | POA: Insufficient documentation

## 2016-01-05 DIAGNOSIS — Z79899 Other long term (current) drug therapy: Secondary | ICD-10-CM | POA: Insufficient documentation

## 2016-01-05 DIAGNOSIS — I5042 Chronic combined systolic (congestive) and diastolic (congestive) heart failure: Secondary | ICD-10-CM | POA: Diagnosis not present

## 2016-01-05 DIAGNOSIS — I130 Hypertensive heart and chronic kidney disease with heart failure and stage 1 through stage 4 chronic kidney disease, or unspecified chronic kidney disease: Secondary | ICD-10-CM | POA: Insufficient documentation

## 2016-01-05 DIAGNOSIS — Z7982 Long term (current) use of aspirin: Secondary | ICD-10-CM | POA: Insufficient documentation

## 2016-01-05 DIAGNOSIS — M5432 Sciatica, left side: Secondary | ICD-10-CM | POA: Diagnosis not present

## 2016-01-05 DIAGNOSIS — M545 Low back pain: Secondary | ICD-10-CM | POA: Diagnosis present

## 2016-01-05 MED ORDER — OXYCODONE-ACETAMINOPHEN 5-325 MG PO TABS
1.0000 | ORAL_TABLET | Freq: Once | ORAL | Status: AC
  Administered 2016-01-05: 1 via ORAL
  Filled 2016-01-05: qty 1

## 2016-01-05 MED ORDER — BACLOFEN 10 MG PO TABS: 10.0000 mg | ORAL_TABLET | Freq: Three times a day (TID) | ORAL | Status: DC

## 2016-01-05 MED ORDER — PREDNISONE 10 MG (21) PO TBPK: ORAL_TABLET | ORAL | Status: DC

## 2016-01-05 MED ORDER — OXYCODONE HCL 5 MG PO TABS: 5.0000 mg | ORAL_TABLET | Freq: Three times a day (TID) | ORAL | Status: DC | PRN

## 2016-01-05 NOTE — ED Provider Notes (Signed)
[facility] Emergency Department Provider Note ____________________________________________  Time seen: Approximately 1:10 PM  I have reviewed the triage vital signs and the nursing notes.   HISTORY  Chief Complaint Leg Pain    HPI Katherine [name] is a 82 y.o. female who presents to the emergency department for evaluation of left lower back pain that radiates down into the left leg and foot. She states that she was using a mop bucket that you step on the base and in it spins the water out of the mop. She states that aftermopping the floor, she began having pain. States that she has had pain in the lower back and had surgery several years ago, she states that this pain is similar to the sciatic pain she experienced prior to surgery. She denies specific injury.   Past Medical History  Diagnosis Date  . Chronic combined systolic and diastolic heart failure (HCC)     EF 35-40%, Akinesis of mid-apicalanteroseptal wall & AK of apex, Gr 1DD.  Marland Kitchen Hypertensive heart disease   . COPD (chronic obstructive pulmonary disease) ([place])     worked in a [place]  . Ischemic cardiomyopathy   . CKD (chronic kidney disease), stage III   . Occlusive coronary artery disease requiring drug therapy     a. Remote MI - never sought treatment when it occurred, shows up old on EKG;  b. Cath 10/2015 - 100% pLAD CTO with 80% D1, prox Cx 60% - EF ~35%  . Fibromyalgia     Patient Active Problem List   Diagnosis Date Noted  . Leg cramps 12/02/2015  . HTN (hypertension) 12/02/2015  . Bradycardia 12/02/2015  . Anemia associated with chronic renal failure 11/15/2015  . Chronic combined systolic and diastolic heart failure ([place])   . Occlusive coronary artery disease requiring drug therapy   . CKD (chronic kidney disease), stage III 11/02/2015  . Hyperlipidemia 11/02/2015  . Cardiomyopathy, ischemic 11/02/2015  . PAH (pulmonary artery hypertension) ([place]) 11/02/2015  . NSTEMI (non-ST elevated  myocardial infarction) ([place]) - 10/30/2015 10/30/2015  . COPD (chronic obstructive pulmonary disease) [facility])     Past Surgical History  Procedure Laterality Date  . Shoulder surgery    . Cardiac catheterization N/A 10/30/2015    Procedure: Right/Left Heart Cath and Coronary Angiography;  Surgeon: Wellington Hampshire, MD;  Location: [place] CV LAB;  Service: Cardiovascular;  Laterality: N/A;  . Cardiac catheterization N/A 10/30/2015    Procedure: Intravascular Pressure Wire/FFR Study;  Surgeon: Wellington Hampshire, MD;  Location: [place] CV LAB;  Service: Cardiovascular;  Laterality: N/A;    Current Outpatient Rx  Name  Route  Sig  Dispense  Refill  . amLODipine (NORVASC) 5 MG tablet   Oral   Take 1 tablet (5 mg total) by mouth daily.   30 tablet   5   . aspirin EC 81 MG tablet   Oral   Take 81 mg by mouth daily.         Marland Kitchen atorvastatin (LIPITOR) 80 MG tablet   Oral   Take 1 tablet (80 mg total) by mouth daily at 6 PM.   90 tablet   3   . carvedilol (COREG) 6.25 MG tablet   Oral   Take 1 tablet (6.25 mg total) by mouth 2 (two) times daily with a meal.   180 tablet   3   . clopidogrel (PLAVIX) 75 MG tablet   Oral   Take 1 tablet (75 mg total) by  mouth daily.   30 tablet   3   . furosemide (LASIX) 20 MG tablet   Oral   Take 1 tablet (20 mg total) by mouth every other day.   30 tablet   3   . gabapentin (NEURONTIN) 100 MG capsule   Oral   Take 200 mg by mouth 3 (three) times daily.         Marland Kitchen HYDROcodone-acetaminophen (NORCO/VICODIN) 5-325 MG tablet   Oral   Take 0.5-1 tablets by mouth daily as needed for moderate pain.         Marland Kitchen lisinopril (PRINIVIL,ZESTRIL) 20 MG tablet   Oral   Take 1 tablet (20 mg total) by mouth daily.   30 tablet   3   . nitroGLYCERIN (NITROSTAT) 0.4 MG SL tablet   Sublingual   Place 1 tablet (0.4 mg total) under the tongue every 5 (five) minutes as needed for chest pain.   25 tablet   3   . Omega-3 Fatty Acids (FISH OIL) 1000  MG CAPS   Oral   Take 1,000 mg by mouth daily.         . pantoprazole (PROTONIX) 40 MG tablet   Oral   Take 1 tablet (40 mg total) by mouth daily.   30 tablet   3   . ranitidine (ZANTAC) 75 MG tablet   Oral   Take 75 mg by mouth 2 (two) times daily as needed.          . sertraline (ZOLOFT) 100 MG tablet   Oral   Take 100 mg by mouth at bedtime.          . traMADol (ULTRAM) 50 MG tablet   Oral   Take 50 mg by mouth every 12 (twelve) hours as needed for moderate pain.            Allergies Review of patient's allergies indicates no known allergies.  Family History  Problem Relation Age of Onset  . Cancer Mother   . CAD Sister   . CAD Brother     Social History Social History  Substance Use Topics  . Smoking status: Never Smoker   . Smokeless tobacco: Never Used  . Alcohol Use: No    Review of Systems Constitutional: No recent illness. Cardiovascular: Denies chest pain or palpitations. Respiratory: Denies shortness of breath. Musculoskeletal: Pain in Left lower back/hip with radiation into the left leg and foot. Skin: Negative for rash, wound, lesion. Neurological: Negative for focal weakness or numbness.  ____________________________________________   PHYSICAL EXAM:  VITAL SIGNS: ED Triage Vitals  Enc Vitals Group     BP 01/05/16 1202 156/90 mmHg     Pulse Rate 01/05/16 1202 67     Resp 01/05/16 1202 18     Temp 01/05/16 1202 98.1 F (36.7 C)     Temp Source 01/05/16 1202 Oral     SpO2 01/05/16 1202 100 %     Weight 01/05/16 1202 143 lb (64.864 kg)     Height 01/05/16 1202 5\' 1"  (1.549 m)     Head Cir --      Peak Flow --      Pain Score --      Pain Loc --      Pain Edu? --      Excl. in [place]? --     Constitutional: Alert and oriented. Well appearing and in no acute distress. Eyes: Conjunctivae are normal. EOMI. Head: Atraumatic. Neck: No stridor.  Respiratory: Normal respiratory  effort.   Musculoskeletal: Straight leg raise positive  at approximately 40 Neurologic:  Normal speech and language. No gross focal neurologic deficits are appreciated. Speech is normal. No gait instability. Skin:  Skin is warm, dry and intact. Atraumatic. Psychiatric: Mood and affect are normal. Speech and behavior are normal.  ____________________________________________   LABS (all labs ordered are listed, but only abnormal results are displayed)  Labs Reviewed - No data to display ____________________________________________  RADIOLOGY  No acute findings of the left hip and pelvis per radiology. I, Sherrie George, personally viewed and evaluated these images (plain radiographs) as part of my medical decision making, as well as reviewing the written report by the radiologist.  ____________________________________________   PROCEDURES  Procedure(s) performed: None   ____________________________________________   INITIAL IMPRESSION / ASSESSMENT AND PLAN / ED COURSE  Pertinent labs & imaging results that were available during my care of the patient were reviewed by me and considered in my medical decision making (see chart for details).  Patient will be prescribed prednisone taper, baclofen, and Roxicet. She was advised to only take the Roxicet if she plans on lying down. She was advised that it may cause dizziness and drowsiness.  She was advised to follow up with the orthopedist for symptoms that are not improving with medications. ____________________________________________   FINAL CLINICAL IMPRESSION(S) / ED DIAGNOSES  Final diagnoses:  Acute pain of left hip       Victorino Dike, FNP 01/05/16 1539  Eula Listen, MD 01/05/16 1556

## 2016-01-05 NOTE — ED Notes (Signed)
Pt reports left leg pain after using step stool mop bucket.

## 2016-01-07 ENCOUNTER — Observation Stay
Admission: EM | Admit: 2016-01-07 | Discharge: 2016-01-09 | Disposition: A | Discharge status: Home / Self Care | Payer: Medicare HMO | Attending: Internal Medicine | Admitting: Internal Medicine

## 2016-01-07 ENCOUNTER — Encounter: Payer: Self-pay | Admitting: Emergency Medicine

## 2016-01-07 ENCOUNTER — Emergency Department: Payer: Medicare HMO

## 2016-01-07 DIAGNOSIS — N183 Chronic kidney disease, stage 3 (moderate): Secondary | ICD-10-CM | POA: Insufficient documentation

## 2016-01-07 DIAGNOSIS — Z8249 Family history of ischemic heart disease and other diseases of the circulatory system: Secondary | ICD-10-CM | POA: Insufficient documentation

## 2016-01-07 DIAGNOSIS — M5430 Sciatica, unspecified side: Secondary | ICD-10-CM

## 2016-01-07 DIAGNOSIS — M79652 Pain in left thigh: Secondary | ICD-10-CM | POA: Diagnosis not present

## 2016-01-07 DIAGNOSIS — I130 Hypertensive heart and chronic kidney disease with heart failure and stage 1 through stage 4 chronic kidney disease, or unspecified chronic kidney disease: Secondary | ICD-10-CM | POA: Insufficient documentation

## 2016-01-07 DIAGNOSIS — Z79899 Other long term (current) drug therapy: Secondary | ICD-10-CM | POA: Diagnosis not present

## 2016-01-07 DIAGNOSIS — I5042 Chronic combined systolic (congestive) and diastolic (congestive) heart failure: Secondary | ICD-10-CM | POA: Diagnosis not present

## 2016-01-07 DIAGNOSIS — N281 Cyst of kidney, acquired: Secondary | ICD-10-CM | POA: Insufficient documentation

## 2016-01-07 DIAGNOSIS — I214 Non-ST elevation (NSTEMI) myocardial infarction: Secondary | ICD-10-CM | POA: Diagnosis present

## 2016-01-07 DIAGNOSIS — R52 Pain, unspecified: Secondary | ICD-10-CM | POA: Diagnosis not present

## 2016-01-07 DIAGNOSIS — I255 Ischemic cardiomyopathy: Secondary | ICD-10-CM | POA: Insufficient documentation

## 2016-01-07 DIAGNOSIS — R748 Abnormal levels of other serum enzymes: Secondary | ICD-10-CM | POA: Diagnosis not present

## 2016-01-07 DIAGNOSIS — M8580 Other specified disorders of bone density and structure, unspecified site: Secondary | ICD-10-CM | POA: Insufficient documentation

## 2016-01-07 DIAGNOSIS — Z7982 Long term (current) use of aspirin: Secondary | ICD-10-CM | POA: Diagnosis not present

## 2016-01-07 DIAGNOSIS — M79605 Pain in left leg: Secondary | ICD-10-CM | POA: Insufficient documentation

## 2016-01-07 DIAGNOSIS — M1990 Unspecified osteoarthritis, unspecified site: Secondary | ICD-10-CM | POA: Diagnosis not present

## 2016-01-07 DIAGNOSIS — Z96641 Presence of right artificial hip joint: Secondary | ICD-10-CM | POA: Insufficient documentation

## 2016-01-07 DIAGNOSIS — M5126 Other intervertebral disc displacement, lumbar region: Secondary | ICD-10-CM | POA: Diagnosis not present

## 2016-01-07 DIAGNOSIS — Z9889 Other specified postprocedural states: Secondary | ICD-10-CM | POA: Insufficient documentation

## 2016-01-07 DIAGNOSIS — I272 Other secondary pulmonary hypertension: Secondary | ICD-10-CM | POA: Insufficient documentation

## 2016-01-07 DIAGNOSIS — M79606 Pain in leg, unspecified: Secondary | ICD-10-CM | POA: Diagnosis not present

## 2016-01-07 DIAGNOSIS — R002 Palpitations: Secondary | ICD-10-CM | POA: Diagnosis not present

## 2016-01-07 DIAGNOSIS — G8929 Other chronic pain: Secondary | ICD-10-CM | POA: Insufficient documentation

## 2016-01-07 DIAGNOSIS — M25552 Pain in left hip: Secondary | ICD-10-CM | POA: Insufficient documentation

## 2016-01-07 DIAGNOSIS — J449 Chronic obstructive pulmonary disease, unspecified: Secondary | ICD-10-CM | POA: Diagnosis not present

## 2016-01-07 DIAGNOSIS — Z7952 Long term (current) use of systemic steroids: Secondary | ICD-10-CM | POA: Insufficient documentation

## 2016-01-07 DIAGNOSIS — I2510 Atherosclerotic heart disease of native coronary artery without angina pectoris: Secondary | ICD-10-CM | POA: Insufficient documentation

## 2016-01-07 DIAGNOSIS — E875 Hyperkalemia: Secondary | ICD-10-CM | POA: Insufficient documentation

## 2016-01-07 DIAGNOSIS — M25561 Pain in right knee: Secondary | ICD-10-CM | POA: Insufficient documentation

## 2016-01-07 DIAGNOSIS — M4806 Spinal stenosis, lumbar region: Secondary | ICD-10-CM | POA: Insufficient documentation

## 2016-01-07 DIAGNOSIS — M25562 Pain in left knee: Secondary | ICD-10-CM | POA: Diagnosis not present

## 2016-01-07 DIAGNOSIS — I509 Heart failure, unspecified: Secondary | ICD-10-CM | POA: Diagnosis not present

## 2016-01-07 DIAGNOSIS — M5432 Sciatica, left side: Secondary | ICD-10-CM | POA: Insufficient documentation

## 2016-01-07 DIAGNOSIS — M5124 Other intervertebral disc displacement, thoracic region: Secondary | ICD-10-CM | POA: Insufficient documentation

## 2016-01-07 DIAGNOSIS — M545 Low back pain, unspecified: Secondary | ICD-10-CM

## 2016-01-07 DIAGNOSIS — M4800 Spinal stenosis, site unspecified: Secondary | ICD-10-CM | POA: Diagnosis not present

## 2016-01-07 DIAGNOSIS — E785 Hyperlipidemia, unspecified: Secondary | ICD-10-CM | POA: Insufficient documentation

## 2016-01-07 DIAGNOSIS — M62838 Other muscle spasm: Secondary | ICD-10-CM | POA: Diagnosis not present

## 2016-01-07 DIAGNOSIS — Z79891 Long term (current) use of opiate analgesic: Secondary | ICD-10-CM | POA: Insufficient documentation

## 2016-01-07 DIAGNOSIS — Z7902 Long term (current) use of antithrombotics/antiplatelets: Secondary | ICD-10-CM | POA: Diagnosis not present

## 2016-01-07 DIAGNOSIS — M797 Fibromyalgia: Secondary | ICD-10-CM | POA: Insufficient documentation

## 2016-01-07 DIAGNOSIS — I252 Old myocardial infarction: Secondary | ICD-10-CM | POA: Diagnosis not present

## 2016-01-07 LAB — COMPREHENSIVE METABOLIC PANEL
ALBUMIN: 4.6 g/dL (ref 3.5–5.0)
ALT: 25 U/L (ref 14–54)
AST: 36 U/L (ref 15–41)
Alkaline Phosphatase: 87 U/L (ref 38–126)
Anion gap: 9 (ref 5–15)
BILIRUBIN TOTAL: 0.7 mg/dL (ref 0.3–1.2)
BUN: 23 mg/dL — AB (ref 6–20)
CHLORIDE: 107 mmol/L (ref 101–111)
CO2: 26 mmol/L (ref 22–32)
CREATININE: 0.89 mg/dL (ref 0.44–1.00)
Calcium: 10 mg/dL (ref 8.9–10.3)
GFR calc Af Amer: >60 mL/min (ref >60)
GFR, EST NON AFRICAN AMERICAN: 58 mL/min — AB (ref >60)
GLUCOSE: 112 mg/dL — AB (ref 65–99)
POTASSIUM: 3.8 mmol/L (ref 3.5–5.1)
Sodium: 142 mmol/L (ref 135–145)
TOTAL PROTEIN: 7.8 g/dL (ref 6.5–8.1)

## 2016-01-07 LAB — URINALYSIS COMPLETE WITH MICROSCOPIC (ARMC ONLY)
BACTERIA UA: NONE SEEN
BILIRUBIN URINE: NEGATIVE
GLUCOSE, UA: NEGATIVE mg/dL
Hgb urine dipstick: NEGATIVE
KETONES UR: NEGATIVE mg/dL
Leukocytes, UA: NEGATIVE
Nitrite: NEGATIVE
PH: 7 (ref 5.0–8.0)
Protein, ur: NEGATIVE mg/dL
Specific Gravity, Urine: 1.008 (ref 1.005–1.030)

## 2016-01-07 LAB — CBC WITH DIFFERENTIAL/PLATELET
Basophils Absolute: 0 10*3/uL (ref 0–0.1)
Basophils Relative: 0 %
EOS ABS: 0 10*3/uL (ref 0–0.7)
HCT: 40.5 % (ref 35.0–47.0)
Hemoglobin: 13 g/dL (ref 12.0–16.0)
LYMPHS ABS: 2.3 10*3/uL (ref 1.0–3.6)
MCH: 30 pg (ref 26.0–34.0)
MCHC: 32 g/dL (ref 32.0–36.0)
MCV: 93.7 fL (ref 80.0–100.0)
Monocytes Absolute: 1.2 10*3/uL — ABNORMAL HIGH (ref 0.2–0.9)
Monocytes Relative: 11 %
Neutro Abs: 8.2 10*3/uL — ABNORMAL HIGH (ref 1.4–6.5)
Neutrophils Relative %: 69 %
PLATELETS: 192 10*3/uL (ref 150–440)
RBC: 4.32 MIL/uL (ref 3.80–5.20)
RDW: 13.7 % (ref 11.5–14.5)
WBC: 11.7 10*3/uL — ABNORMAL HIGH (ref 3.6–11.0)

## 2016-01-07 LAB — TROPONIN I
TROPONIN I: 0.07 ng/mL — AB (ref <0.031)
Troponin I: 0.09 ng/mL — ABNORMAL HIGH (ref <0.031)

## 2016-01-07 MED ORDER — HYDROMORPHONE HCL 1 MG/ML IJ SOLN
2.0000 mg | INTRAMUSCULAR | Status: DC | PRN
  Administered 2016-01-07 – 2016-01-09 (×5): 2 mg via INTRAVENOUS
  Filled 2016-01-07 (×5): qty 2

## 2016-01-07 MED ORDER — LIDOCAINE 5 % EX PTCH
1.0000 | MEDICATED_PATCH | CUTANEOUS | Status: DC
  Administered 2016-01-07 – 2016-01-08 (×2): 1 via TRANSDERMAL
  Filled 2016-01-07 (×3): qty 1

## 2016-01-07 MED ORDER — MORPHINE SULFATE (PF) 4 MG/ML IV SOLN
4.0000 mg | Freq: Once | INTRAVENOUS | Status: AC
  Administered 2016-01-07: 4 mg via INTRAVENOUS
  Filled 2016-01-07: qty 1

## 2016-01-07 MED ORDER — ONDANSETRON HCL 4 MG PO TABS
4.0000 mg | ORAL_TABLET | Freq: Four times a day (QID) | ORAL | Status: DC | PRN
Start: 2016-01-07 — End: 2016-01-09

## 2016-01-07 MED ORDER — PANTOPRAZOLE SODIUM 40 MG PO TBEC
40.0000 mg | DELAYED_RELEASE_TABLET | Freq: Every day | ORAL | Status: DC
  Administered 2016-01-08 – 2016-01-09 (×2): 40 mg via ORAL
  Filled 2016-01-07 (×2): qty 1

## 2016-01-07 MED ORDER — ACETAMINOPHEN 325 MG PO TABS
650.0000 mg | ORAL_TABLET | Freq: Four times a day (QID) | ORAL | Status: DC | PRN
  Administered 2016-01-07 – 2016-01-09 (×2): 650 mg via ORAL
  Filled 2016-01-07 (×3): qty 2

## 2016-01-07 MED ORDER — SODIUM CHLORIDE 0.9 % IV BOLUS (SEPSIS)
1000.0000 mL | Freq: Once | INTRAVENOUS | Status: AC
  Administered 2016-01-07: 1000 mL via INTRAVENOUS

## 2016-01-07 MED ORDER — OMEGA-3-ACID ETHYL ESTERS 1 G PO CAPS
1.0000 g | ORAL_CAPSULE | Freq: Every day | ORAL | Status: DC
  Administered 2016-01-08 – 2016-01-09 (×2): 1 g via ORAL
  Filled 2016-01-07 (×2): qty 1

## 2016-01-07 MED ORDER — PREDNISONE 20 MG PO TABS
50.0000 mg | ORAL_TABLET | ORAL | Status: AC
  Administered 2016-01-07: 50 mg via ORAL
  Filled 2016-01-07: qty 2

## 2016-01-07 MED ORDER — LISINOPRIL 20 MG PO TABS
20.0000 mg | ORAL_TABLET | Freq: Every day | ORAL | Status: DC
  Administered 2016-01-08 – 2016-01-09 (×2): 20 mg via ORAL
  Filled 2016-01-07 (×2): qty 1

## 2016-01-07 MED ORDER — ACETAMINOPHEN 650 MG RE SUPP: 650.0000 mg | Freq: Four times a day (QID) | RECTAL | Status: DC | PRN

## 2016-01-07 MED ORDER — FENTANYL CITRATE (PF) 100 MCG/2ML IJ SOLN
75.0000 ug | Freq: Once | INTRAMUSCULAR | Status: AC
  Administered 2016-01-07: 75 ug via INTRAVENOUS
  Filled 2016-01-07: qty 2

## 2016-01-07 MED ORDER — BACLOFEN 10 MG PO TABS: 10.0000 mg | ORAL_TABLET | Freq: Three times a day (TID) | ORAL | Status: DC | PRN

## 2016-01-07 MED ORDER — DOCUSATE SODIUM 100 MG PO CAPS
100.0000 mg | ORAL_CAPSULE | Freq: Two times a day (BID) | ORAL | Status: DC
  Administered 2016-01-07 – 2016-01-09 (×4): 100 mg via ORAL
  Filled 2016-01-07 (×4): qty 1

## 2016-01-07 MED ORDER — NITROGLYCERIN 0.4 MG SL SUBL: 0.4000 mg | SUBLINGUAL_TABLET | SUBLINGUAL | Status: DC | PRN

## 2016-01-07 MED ORDER — ONDANSETRON HCL 4 MG/2ML IJ SOLN: 4.0000 mg | Freq: Four times a day (QID) | INTRAMUSCULAR | Status: DC | PRN

## 2016-01-07 MED ORDER — SODIUM CHLORIDE 0.9 % IV SOLN
INTRAVENOUS | Status: DC
  Administered 2016-01-07: 22:00:00 via INTRAVENOUS

## 2016-01-07 MED ORDER — ONDANSETRON HCL 4 MG/2ML IJ SOLN
4.0000 mg | Freq: Once | INTRAMUSCULAR | Status: AC
  Administered 2016-01-07: 4 mg via INTRAVENOUS
  Filled 2016-01-07: qty 2

## 2016-01-07 MED ORDER — PREDNISONE 20 MG PO TABS
50.0000 mg | ORAL_TABLET | Freq: Every day | ORAL | Status: DC
  Administered 2016-01-08 – 2016-01-09 (×2): 50 mg via ORAL
  Filled 2016-01-07 (×2): qty 1

## 2016-01-07 MED ORDER — ASPIRIN EC 81 MG PO TBEC
81.0000 mg | DELAYED_RELEASE_TABLET | Freq: Every day | ORAL | Status: DC
  Administered 2016-01-08 – 2016-01-09 (×2): 81 mg via ORAL
  Filled 2016-01-07 (×2): qty 1

## 2016-01-07 MED ORDER — AMLODIPINE BESYLATE 5 MG PO TABS
5.0000 mg | ORAL_TABLET | Freq: Every day | ORAL | Status: DC
  Administered 2016-01-08 – 2016-01-09 (×2): 5 mg via ORAL
  Filled 2016-01-07 (×2): qty 1

## 2016-01-07 MED ORDER — IOPAMIDOL (ISOVUE-370) INJECTION 76%
100.0000 mL | Freq: Once | INTRAVENOUS | Status: AC | PRN
  Administered 2016-01-07: 100 mL via INTRAVENOUS

## 2016-01-07 MED ORDER — CLOPIDOGREL BISULFATE 75 MG PO TABS
75.0000 mg | ORAL_TABLET | Freq: Every day | ORAL | Status: DC
  Administered 2016-01-08 – 2016-01-09 (×2): 75 mg via ORAL
  Filled 2016-01-07 (×2): qty 1

## 2016-01-07 MED ORDER — OXYCODONE HCL 5 MG PO TABS
5.0000 mg | ORAL_TABLET | ORAL | Status: DC | PRN
  Administered 2016-01-08 – 2016-01-09 (×4): 5 mg via ORAL
  Filled 2016-01-07 (×4): qty 1

## 2016-01-07 MED ORDER — CARVEDILOL 6.25 MG PO TABS
6.2500 mg | ORAL_TABLET | Freq: Two times a day (BID) | ORAL | Status: DC
Start: 2016-01-08 — End: 2016-01-09
  Administered 2016-01-08 – 2016-01-09 (×3): 6.25 mg via ORAL
  Filled 2016-01-07 (×3): qty 1

## 2016-01-07 NOTE — ED Notes (Signed)
Dr. Reita Cliche notified of troponin 0.07

## 2016-01-07 NOTE — ED Notes (Signed)
Patient cleaned and linen changed. 

## 2016-01-07 NOTE — ED Provider Notes (Signed)
[facility] Emergency Department Provider Note   ____________________________________________  Time seen: Approximately 3 PM I have reviewed the triage vital signs and the triage nursing note.  HISTORY  Chief Complaint Leg Pain   Historian Patient Patient's sister and brother in law  HPI Katherine [name] is a 82 y.o. female is from out of town, but has been staying with her sister and brother-in-law for the past 2-3 months, is here for evaluation of decreased by mouth intake, as well as low back pain extending down the left lower extremity. She was evaluated a few days ago in the flex care area of the emergency department for a sciatica type pain which occurred after overuse injury without focal trauma, but x-ray was obtained and negative, and was discharged home on narcotic pain medication and muscle relaxers. Brother-in-law states that the patient did initially get good results with oxycodone however after the first dose she stopped eating and was only drinking small amounts and was not taking any additional medications. She is complaining of moderate to severe low back pain going into the posterior buttock and left leg. Denies vomiting but has nausea. No chest pain or trouble breathing. Denies fevers or chills.   Symptoms are moderate to severe. Moving around makes the back and leg pain worse. Swelling.   Past Medical History  Diagnosis Date  . Chronic combined systolic and diastolic heart failure (HCC)     EF 35-40%, Akinesis of mid-apicalanteroseptal wall & AK of apex, Gr 1DD.  Marland Kitchen Hypertensive heart disease   . COPD (chronic obstructive pulmonary disease) ([place])     worked in a [employer]  . Ischemic cardiomyopathy   . CKD (chronic kidney disease), stage III   . Occlusive coronary artery disease requiring drug therapy     a. Remote MI - never sought treatment when it occurred, shows up old on EKG;  b. Cath 10/2015 - 100% pLAD CTO with 80% D1, prox Cx 60% - EF ~35%   . Fibromyalgia   . Hypertension   . CHF (congestive heart failure) [facility])     Patient Active Problem List   Diagnosis Date Noted  . Leg cramps 12/02/2015  . HTN (hypertension) 12/02/2015  . Bradycardia 12/02/2015  . Anemia associated with chronic renal failure 11/15/2015  . Chronic combined systolic and diastolic heart failure ([place])   . Occlusive coronary artery disease requiring drug therapy   . CKD (chronic kidney disease), stage III 11/02/2015  . Hyperlipidemia 11/02/2015  . Cardiomyopathy, ischemic 11/02/2015  . PAH (pulmonary artery hypertension) ([place]) 11/02/2015  . NSTEMI (non-ST elevated myocardial infarction) ([place]) - 10/30/2015 10/30/2015  . COPD (chronic obstructive pulmonary disease) [facility])     Past Surgical History  Procedure Laterality Date  . Shoulder surgery    . Cardiac catheterization N/A 10/30/2015    Procedure: Right/Left Heart Cath and Coronary Angiography;  Surgeon: Wellington Hampshire, MD;  Location: [place] [facility];  Service: Cardiovascular;  Laterality: N/A;  . Cardiac catheterization N/A 10/30/2015    Procedure: Intravascular Pressure Wire/FFR Study;  Surgeon: Wellington Hampshire, MD;  Location: [place] [facility];  Service: Cardiovascular;  Laterality: N/A;    Current Outpatient Rx  Name  Route  Sig  Dispense  Refill  . amLODipine (NORVASC) 5 MG tablet   Oral   Take 1 tablet (5 mg total) by mouth daily.   30 tablet   5   . aspirin EC 81 MG tablet   Oral   Take 81  mg by mouth daily.         Marland Kitchen atorvastatin (LIPITOR) 80 MG tablet   Oral   Take 1 tablet (80 mg total) by mouth daily at 6 PM.   90 tablet   3   . baclofen (LIORESAL) 10 MG tablet   Oral   Take 1 tablet (10 mg total) by mouth 3 (three) times daily.   30 tablet   0   . carvedilol (COREG) 6.25 MG tablet   Oral   Take 1 tablet (6.25 mg total) by mouth 2 (two) times daily with a meal.   180 tablet   3   . clopidogrel (PLAVIX) 75 MG tablet   Oral   Take 1 tablet (75 mg total) by  mouth daily.   30 tablet   3   . furosemide (LASIX) 20 MG tablet   Oral   Take 1 tablet (20 mg total) by mouth every other day.   30 tablet   3   . gabapentin (NEURONTIN) 100 MG capsule   Oral   Take 200 mg by mouth 3 (three) times daily.         Marland Kitchen lisinopril (PRINIVIL,ZESTRIL) 20 MG tablet   Oral   Take 1 tablet (20 mg total) by mouth daily.   30 tablet   3   . nitroGLYCERIN (NITROSTAT) 0.4 MG SL tablet   Sublingual   Place 1 tablet (0.4 mg total) under the tongue every 5 (five) minutes as needed for chest pain.   25 tablet   3   . Omega-3 Fatty Acids (FISH OIL) 1000 MG CAPS   Oral   Take 1,000 mg by mouth daily.         Marland Kitchen oxyCODONE (ROXICODONE) 5 MG immediate release tablet   Oral   Take 1 tablet (5 mg total) by mouth every 8 (eight) hours as needed.   20 tablet   0   . pantoprazole (PROTONIX) 40 MG tablet   Oral   Take 1 tablet (40 mg total) by mouth daily.   30 tablet   3   . predniSONE (STERAPRED UNI-PAK 21 TAB) 10 MG (21) TBPK tablet      Take 6 tablets on day 1 Take 5 tablets on day 2 Take 4 tablets on day 3 Take 3 tablets on day 4 Take 2 tablets on day 5 Take 1 tablet on day 6   21 tablet   0   . ranitidine (ZANTAC) 75 MG tablet   Oral   Take 75 mg by mouth 2 (two) times daily as needed.          . sertraline (ZOLOFT) 100 MG tablet   Oral   Take 100 mg by mouth at bedtime.          Marland Kitchen HYDROcodone-acetaminophen (NORCO/VICODIN) 5-325 MG tablet   Oral   Take 0.5-1 tablets by mouth daily as needed for moderate pain.         . traMADol (ULTRAM) 50 MG tablet   Oral   Take 50 mg by mouth every 12 (twelve) hours as needed for moderate pain.            Allergies Review of patient's allergies indicates no known allergies.  Family History  Problem Relation Age of Onset  . Cancer Mother   . CAD Sister   . CAD Brother     Social History Social History  Substance Use Topics  . Smoking status: Never Smoker   . Smokeless  tobacco:  Never Used  . Alcohol Use: No    Review of Systems  Constitutional: Negative for fever. Eyes: Negative for visual changes. ENT: Negative for sore throat. Cardiovascular: Negative for chest pain. Respiratory: Negative for shortness of breath. Gastrointestinal: Negative for abdominal pain, vomiting and diarrhea. Genitourinary: Negative for dysuria. Musculoskeletal:Positive for low back pain. Skin: Negative for rash. Neurological: Negative for headache. 10 point Review of Systems otherwise negative ____________________________________________   PHYSICAL EXAM:  VITAL SIGNS: ED Triage Vitals  Enc Vitals Group     BP 01/07/16 1108 151/80 mmHg     Pulse Rate 01/07/16 1108 82     Resp 01/07/16 1108 18     Temp 01/07/16 1108 97.8 F (36.6 C)     Temp Source 01/07/16 1108 Oral     SpO2 01/07/16 1108 97 %     Weight 01/07/16 1108 150 lb (68.04 kg)     Height 01/07/16 1108 5\' 2"  (1.575 m)     Head Cir --      Peak Flow --      Pain Score 01/07/16 1116 10     Pain Loc --      Pain Edu? --      Excl. in [place]? --      Constitutional: Alert and oriented.Laying on her right side but moving around quite a bit complaining of pain. HEENT   Head: Normocephalic and atraumatic.      Eyes: Conjunctivae are normal. PERRL. Normal extraocular movements.      Ears:         Nose: No congestion/rhinnorhea.   Mouth/Throat: Mucous membranes are moist.   Neck: No stridor. Cardiovascular/Chest: Normal rate, regular rhythm.  No murmurs, rubs, or gallops. Respiratory: Normal respiratory effort without tachypnea nor retractions. Breath sounds are clear and equal bilaterally. No wheezes/rales/rhonchi. Gastrointestinal: Soft. No distention, no guarding, no rebound. Nontender.   Genitourinary/rectal:Deferred Musculoskeletal: No focal midline tenderness to palpation on the spine and CT or L. Claims of some paraspinous tenderness and pain in the mid left buttock. Nontender with normal range of  motion in all extremities. No joint effusions.  No lower extremity tenderness.  No edema. Neurologic:  Normal speech and language. No gross or focal neurologic deficits are appreciated. Skin:  Skin is warm, dry and intact. No rash noted. Psychiatric: Mood and affect are normal. Speech and behavior are normal. Patient exhibits appropriate insight and judgment.  ____________________________________________   EKG I, Lisa Roca, MD, the attending physician have personally viewed and interpreted all ECGs.  85 bpm. Normal sinus rhythm. Narrow QRS. Normal axis. Nonspecific T-wave ____________________________________________  LABS (pertinent positives/negatives)  Labs Reviewed  COMPREHENSIVE METABOLIC PANEL - Abnormal; Notable for the following:    Glucose, Bld 112 (*)    BUN 23 (*)    GFR calc non Af Amer 58 (*)    All other components within normal limits  CBC WITH DIFFERENTIAL/PLATELET - Abnormal; Notable for the following:    WBC 11.7 (*)    Neutro Abs 8.2 (*)    Monocytes Absolute 1.2 (*)    All other components within normal limits  TROPONIN I - Abnormal; Notable for the following:    Troponin I 0.07 (*)    All other components within normal limits  URINALYSIS COMPLETEWITH MICROSCOPIC ([facility] ONLY) - Abnormal; Notable for the following:    Color, Urine YELLOW (*)    APPearance CLEAR (*)    Squamous Epithelial / LPF 0-5 (*)    All other components within  normal limits    ____________________________________________  RADIOLOGY All Xrays were viewed by me. Imaging interpreted by Radiologist.  CT abdomen and pelvis with contrast:  IMPRESSION: No acute findings in the abdomen/ pelvis.  2.3 cm right renal cyst.  Minimal diverticulosis of the colon.  Significant degenerative changes with multilevel disc disease over the lumbosacral spine. Grade 1-2 anterolisthesis L4 on L5. __________________________________________  PROCEDURES  Procedure(s) performed: None  Critical  Care performed: None  ____________________________________________   ED COURSE / ASSESSMENT AND PLAN  Pertinent labs & imaging results that were available during my care of the patient were reviewed by me and considered in my medical decision making (see chart for details).   Patient is here with persistent/ongoing moderate to severe low back pain which does still clinically sound like sciatica, however the problem is she has not been taking her symptomatic relief medications and reports that she has no appetite. Family is concerned she might this point be dehydrated.  Her laboratory studies, she does not have evidence of a urinary tract infection. No elevated BUN and creatinine or acute electrolyte abnormality. Her white blood cell count is a little bit elevated, uncertain etiology.  She is requiring a second dose of morphine. I am going to order an angiogram CT study to evaluate the abdomen did make sure that this is not due to a vascular emergency of the aorta.  The patient was recently catheterized due to an and STEMI at [facility], and she is not complaining of any chest discomfort now. Her troponin is minimally elevated at 0.07 with a reassuring EKG.   Patient required multiple doses of IV narcotic medicine without any reasonable pain relief for going home. She is going to need overnight observation admission for uncontrolled pain. She may need acute care nursing placement.  CT was obtained of the abdomen and pelvis and shows no intra-abdominal source or a vascular source of the pain. Again my clinical suspicion is acute sciatica with pain uncontrolled.    CONSULTATIONS:   Hospitalist for admission   Patient / Family / Caregiver informed of clinical course, medical decision-making process, and agree with plan.   ___________________________________________   FINAL CLINICAL IMPRESSION(S) / ED DIAGNOSES   Final diagnoses:  Sciatica, left side  Uncontrolled pain               Note: This dictation was prepared with Dragon dictation. Any transcriptional errors that result from this process are unintentional   Lisa Roca, MD 01/07/16 1921

## 2016-01-07 NOTE — ED Notes (Signed)
Patient transported to CT 

## 2016-01-07 NOTE — ED Notes (Signed)
Pt moaning during triage and rocking back & forth.

## 2016-01-07 NOTE — Progress Notes (Signed)
Second troponin 0.09. Dr. Posey Pronto notified and no new orders

## 2016-01-07 NOTE — ED Notes (Addendum)
Pt returns to ED after treatment for sciatica. She c/o pain in her knee and leg. Pt was seen here and given prednisone and oxycodone and muscle relaxer and has taken them with some relief but will not eat and is not taking her medication because she is not eating. Pt's family decided not to give her the pain medication because she was hallucinating and not eating. Family concerned about dehydration as pt has no oral intake in over 24 hours.

## 2016-01-07 NOTE — H&P (Signed)
[facility] [place] NAME: Katherine [name]    MR#:  [id number]  DATE OF BIRTH:  Nov 13, 1929  DATE OF ADMISSION:  01/07/2016  PRIMARY CARE PHYSICIAN: No PCP Per Patient   REQUESTING/REFERRING PHYSICIAN: Dr. Lisa Roca  CHIEF COMPLAINT:   Chief Complaint  Patient presents with  . Leg Pain    HISTORY OF PRESENT ILLNESS:  Katherine [name]  is a 82 y.o. female with a known history of CAD with single-vessel disease, on medical management, ischemic cardiomyopathy and congestive heart failure with EF of 35% on cardiac catheterization in March 2017, COPD, degenerative arthritis, fibromyalgia, CK D stage III, hypertension presents from home secondary to worsening left leg pain and low back pain. Patient was in the emergency room 2 days ago with low back pain radiating behind her left leg all the way to her foot. No trauma. She fell last month and complained of right hip pain at which time x-rays were negative. She was discharged on pain medications for her sciatica. The oxycodone initially helped her pain but however decreased her appetite. Her pain has become uncontrolled and so she was brought back to the emergency room. Her pain is more on the anterior part of the left thigh and now with limited mobility to the left hip. She had a CT angiogram done of her abdomen and pelvis that did not reveal any left hip abnormality. I did discuss with radiologist about the bone window pictures and left hip did not show any fractures. No fevers or chills. No nausea or vomiting or other illness. Patient is forgetful sometimes and is not a great historian. She is also very hard of hearing.  PAST MEDICAL HISTORY:   Past Medical History  Diagnosis Date  . Chronic combined systolic and diastolic heart failure (HCC)     EF 35-40%, Akinesis of mid-apicalanteroseptal wall & AK of apex, Gr 1DD.  Marland Kitchen Hypertensive heart disease   . COPD (chronic obstructive pulmonary disease) ([place])      worked in a [employer]  . Ischemic cardiomyopathy   . CKD (chronic kidney disease), stage III   . Occlusive coronary artery disease requiring drug therapy     a. Remote MI - never sought treatment when it occurred, shows up old on EKG;  b. Cath 10/2015 - 100% pLAD CTO with 80% D1, prox Cx 60% - EF ~35%  . Fibromyalgia   . Hypertension   . CHF (congestive heart failure) ([place])   . CAD (coronary artery disease)     on medical management    PAST SURGICAL HISTORY:   Past Surgical History  Procedure Laterality Date  . Shoulder surgery    . Cardiac catheterization N/A 10/30/2015    Procedure: Right/Left Heart Cath and Coronary Angiography;  Surgeon: Wellington Hampshire, MD;  Location: [facility];  Service: Cardiovascular;  Laterality: N/A;  . Cardiac catheterization N/A 10/30/2015    Procedure: Intravascular Pressure Wire/FFR Study;  Surgeon: Wellington Hampshire, MD;  Location: [facility];  Service: Cardiovascular;  Laterality: N/A;    SOCIAL HISTORY:   Social History  Substance Use Topics  . Smoking status: Never Smoker   . Smokeless tobacco: Never Used  . Alcohol Use: No    FAMILY HISTORY:   Family History  Problem Relation Age of Onset  . Cancer Mother   . CAD Sister   . CAD Brother     DRUG ALLERGIES:  No Known Allergies  REVIEW  OF SYSTEMS:   Review of Systems  Constitutional: Positive for malaise/fatigue. Negative for fever, chills and weight loss.  HENT: Positive for hearing loss. Negative for ear discharge, ear pain, nosebleeds and tinnitus.   Eyes: Negative for blurred vision, double vision and photophobia.  Respiratory: Positive for shortness of breath. Negative for cough, hemoptysis and wheezing.   Cardiovascular: Negative for chest pain, palpitations, orthopnea and leg swelling.  Gastrointestinal: Negative for nausea, vomiting, abdominal pain, diarrhea, constipation and melena.  Genitourinary: Negative for dysuria, urgency and frequency.   Musculoskeletal: Positive for back pain and joint pain. Negative for myalgias and neck pain.  Skin: Negative for rash.  Neurological: Positive for sensory change. Negative for dizziness, tingling, speech change, focal weakness and headaches.  Endo/Heme/Allergies: Does not bruise/bleed easily.  Psychiatric/Behavioral: Negative for depression.    MEDICATIONS AT HOME:   Prior to Admission medications   Medication Sig Start Date End Date Taking? Authorizing Provider  amLODipine (NORVASC) 5 MG tablet Take 1 tablet (5 mg total) by mouth daily. 12/26/15  Yes Wellington Hampshire, MD  aspirin EC 81 MG tablet Take 81 mg by mouth daily.   Yes Historical Provider, MD  atorvastatin (LIPITOR) 80 MG tablet Take 1 tablet (80 mg total) by mouth daily at 6 PM. 11/14/15  Yes Imogene Burn, PA-C  baclofen (LIORESAL) 10 MG tablet Take 1 tablet (10 mg total) by mouth 3 (three) times daily. 01/05/16  Yes Victorino Dike, FNP  carvedilol (COREG) 6.25 MG tablet Take 1 tablet (6.25 mg total) by mouth 2 (two) times daily with a meal. 11/14/15  Yes Imogene Burn, PA-C  clopidogrel (PLAVIX) 75 MG tablet Take 1 tablet (75 mg total) by mouth daily. 11/14/15  Yes Imogene Burn, PA-C  furosemide (LASIX) 20 MG tablet Take 1 tablet (20 mg total) by mouth every other day. 12/18/15  Yes Wellington Hampshire, MD  lisinopril (PRINIVIL,ZESTRIL) 20 MG tablet Take 1 tablet (20 mg total) by mouth daily. 12/18/15  Yes Wellington Hampshire, MD  nitroGLYCERIN (NITROSTAT) 0.4 MG SL tablet Place 1 tablet (0.4 mg total) under the tongue every 5 (five) minutes as needed for chest pain. 11/14/15  Yes Imogene Burn, PA-C  omega-3 acid ethyl esters (LOVAZA) 1 g capsule Take 1 g by mouth daily.   Yes Historical Provider, MD  oxyCODONE (OXY IR/ROXICODONE) 5 MG immediate release tablet Take 5 mg by mouth every 8 (eight) hours as needed for severe pain.   Yes Historical Provider, MD  pantoprazole (PROTONIX) 40 MG tablet Take 1 tablet (40 mg total) by mouth  daily. 11/02/15  Yes Rogelia Mire, NP  predniSONE (DELTASONE) 10 MG tablet Take 10-60 mg by mouth daily with breakfast. Pt is to take as a taper:  Six tablets on day 1, five tablets on day 2, four tablets on day 3, three tablets on day 4, two tablets on day 5, one tablet on day 6, then stop.   Yes Historical Provider, MD      VITAL SIGNS:  Blood pressure 177/73, pulse 80, temperature 97.8 F (36.6 C), temperature source Oral, resp. rate 13, height 5\' 2"  (1.575 m), weight 68.04 kg (150 lb), SpO2 94 %.  PHYSICAL EXAMINATION:   Physical Exam  GENERAL:  82 y.o.-year-old patient lying in the bed And appears to be in distress secondary to left leg pain.  Patient is hard of hearing. EYES: Pupils equal, round, reactive to light and accommodation. No scleral icterus. Extraocular muscles intact.  HEENT:  Head atraumatic, normocephalic. Oropharynx and nasopharynx clear. Dry mucous membranes noted. NECK:  Supple, no jugular venous distention. No thyroid enlargement, no tenderness.  LUNGS: Normal breath sounds bilaterally, no wheezing, rales,rhonchi or crepitation. No use of accessory muscles of respiration. Decreased bibasilar breath sounds. CARDIOVASCULAR: S1, S2 normal. No rubs, or gallops. 2/6 systolic murmur is present ABDOMEN: Soft, nontender, nondistended. Bowel sounds present. No organomegaly or mass.  EXTREMITIES: No pedal edema, cyanosis, or clubbing. No tenderness or ecchymosis noted on the left leg. NEUROLOGIC: Cranial nerves II through XII are intact. Muscle strength 5/5 in all extremities except left hip due to pain. Sensation intact. Gait not checked.  PSYCHIATRIC: The patient is alert and oriented x 3. Is forgetful. SKIN: No obvious rash, lesion, or ulcer.   LABORATORY PANEL:   CBC  Recent Labs Lab 01/07/16 1549  WBC 11.7*  HGB 13.0  HCT 40.5  PLT 192    ------------------------------------------------------------------------------------------------------------------  Chemistries   Recent Labs Lab 01/07/16 1549  NA 142  K 3.8  CL 107  CO2 26  GLUCOSE 112*  BUN 23*  CREATININE 0.89  CALCIUM 10.0  AST 36  ALT 25  ALKPHOS 87  BILITOT 0.7   ------------------------------------------------------------------------------------------------------------------  Cardiac Enzymes  Recent Labs Lab 01/07/16 1549  TROPONINI 0.07*   ------------------------------------------------------------------------------------------------------------------  RADIOLOGY:  Ct Cta Abd/pel W/cm &/or W/o Cm  01/07/2016  ADDENDUM REPORT: 01/07/2016 19:54 ADDENDUM: The there are a few small bilateral sub cm renal cortical hypodensities too small to characterize but likely cysts. Electronically Signed   By: Marin Olp M.D.   On: 01/07/2016 19:54  01/07/2016  CLINICAL DATA:  Sciatica.  Possible dehydration. EXAM: CTA ABDOMEN AND PELVIS wITHOUT AND WITH CONTRAST TECHNIQUE: Multidetector CT imaging of the abdomen and pelvis was performed using the standard protocol during bolus administration of intravenous contrast. Multiplanar reconstructed images and MIPs were obtained and reviewed to evaluate the vascular anatomy. CONTRAST:  100 mL Isovue 370 IV COMPARISON:  None. FINDINGS: Lung bases are within normal. Abdominal images demonstrate multiple splenic granulomas. The liver, pancreas, gallbladder and adrenal glands are normal. Kidneys are normal in size without hydronephrosis or nephrolithiasis. There is a 2.3 cm cyst over the mid pole the right kidney. Ureters are within normal. There is mild calcified plaque over the abdominal aorta iliac arteries. Mild focal stenosis at the takeoff of the celiac axis for comparison Appendix is not well visualized. Minimal diverticulosis of the colon which is otherwise within normal. Small bowel is within normal. Mesentery is  unremarkable without inflammatory change or free fluid. Pelvic images demonstrate mild bladder distension. Surgical absence of the uterus. No free pelvic fluid. Right hip arthroplasty intact. There are degenerative changes of the spine with multilevel disc disease over the lumbosacral spine mean grade 1-2 anterolisthesis of L4 on L5. Previous L3-L5 laminectomy. Several metallic densities over the inguinal regions. Review of the MIP images confirms the above findings. IMPRESSION: No acute findings in the abdomen/ pelvis. 2.3 cm right renal cyst. Minimal diverticulosis of the colon. Significant degenerative changes with multilevel disc disease over the lumbosacral spine. Grade 1-2 anterolisthesis L4 on L5. Electronically Signed: By: Marin Olp M.D. On: 01/07/2016 18:26    EKG:   Orders placed or performed during the hospital encounter of 01/07/16  . ED EKG  . ED EKG  . EKG 12-Lead  . EKG 12-Lead    IMPRESSION AND PLAN:   Katherine [name]  is a 82 y.o. female with a known history of CAD with single-vessel disease,  on medical management, ischemic cardiomyopathy and congestive heart failure with EF of 35% on cardiac catheterization in March 2017, COPD, degenerative arthritis, fibromyalgia, CK D stage III, hypertension presents from home secondary to worsening left leg pain and low back pain.  #1 left leg pain- could be sciatica versus referred pain to the left thigh. -CT of the abdomen and pelvis-discussed with the radiologist about bone window and anemia and left hip fractures, nothing seen. -Left knee x-rays ordered. Continue her baclofen for muscle spasms. Also on IV and oral pain medications. Lidoderm patch applied. -Prednisone which was started recently is being continued for her sciatica possibly. -MRI of the lumbar spine and orthopedics consult. -Physical therapy consult  #2 CAD-recent admission to [facility] in March 2017 with STEMI. Cardiac catheterization revealed occlusive LAD with  collaterals. Being followed by Dr. Fletcher Anon -Continue cardiac medications. Patient on aspirin, Plavix, lisinopril and Coreg. Hold statin with muscle symptoms  #3 Ischemic cardiomyopathy with combined heart failure-  #4 hyperlipidemia-continue her Lovaza. Hold high dose statin with her myopathy symptoms until other causes found. Last known LDL 48 Also for her muscle spasms in lower extremities- [place] was done as outpt- negative for DVT and ABIs were normal.  #5 hypertension- low-dose Coreg, Norvasc and lisinopril. Monitor especially with pain medications.  #6 DVT prophylaxis-Ted's and SCDs for now. In case patient needs any procedures after the MRI.    All the records are reviewed and case discussed with ED provider. Management plans discussed with the patient, family and they are in agreement.  CODE STATUS: Full Code  TOTAL TIME TAKING CARE OF THIS PATIENT: 50 minutes.    Gladstone Lighter M.D on 01/07/2016 at 7:58 PM  Between 7am to 6pm - Pager - [phone]  After 6pm go to [email or web address] - password [email or web address] [facility]  [facility]  Office  [phone]  CC: Primary care physician; No PCP Per Patient

## 2016-01-08 ENCOUNTER — Observation Stay: Payer: Medicare HMO

## 2016-01-08 DIAGNOSIS — M4806 Spinal stenosis, lumbar region: Secondary | ICD-10-CM | POA: Diagnosis not present

## 2016-01-08 DIAGNOSIS — I5042 Chronic combined systolic (congestive) and diastolic (congestive) heart failure: Secondary | ICD-10-CM | POA: Diagnosis not present

## 2016-01-08 DIAGNOSIS — I255 Ischemic cardiomyopathy: Secondary | ICD-10-CM

## 2016-01-08 DIAGNOSIS — M4800 Spinal stenosis, site unspecified: Secondary | ICD-10-CM | POA: Diagnosis not present

## 2016-01-08 DIAGNOSIS — M5126 Other intervertebral disc displacement, lumbar region: Secondary | ICD-10-CM | POA: Diagnosis not present

## 2016-01-08 DIAGNOSIS — N183 Chronic kidney disease, stage 3 (moderate): Secondary | ICD-10-CM

## 2016-01-08 DIAGNOSIS — M545 Low back pain, unspecified: Secondary | ICD-10-CM

## 2016-01-08 DIAGNOSIS — M5116 Intervertebral disc disorders with radiculopathy, lumbar region: Secondary | ICD-10-CM | POA: Diagnosis not present

## 2016-01-08 DIAGNOSIS — I509 Heart failure, unspecified: Secondary | ICD-10-CM | POA: Diagnosis not present

## 2016-01-08 DIAGNOSIS — M79606 Pain in leg, unspecified: Secondary | ICD-10-CM | POA: Diagnosis not present

## 2016-01-08 DIAGNOSIS — M5430 Sciatica, unspecified side: Secondary | ICD-10-CM

## 2016-01-08 DIAGNOSIS — I2510 Atherosclerotic heart disease of native coronary artery without angina pectoris: Secondary | ICD-10-CM | POA: Diagnosis not present

## 2016-01-08 DIAGNOSIS — G8929 Other chronic pain: Secondary | ICD-10-CM

## 2016-01-08 DIAGNOSIS — M79605 Pain in left leg: Secondary | ICD-10-CM

## 2016-01-08 LAB — BASIC METABOLIC PANEL
Anion gap: 6 (ref 5–15)
BUN: 21 mg/dL — AB (ref 6–20)
CALCIUM: 9.3 mg/dL (ref 8.9–10.3)
CO2: 26 mmol/L (ref 22–32)
Chloride: 109 mmol/L (ref 101–111)
Creatinine, Ser: 0.92 mg/dL (ref 0.44–1.00)
GFR, EST NON AFRICAN AMERICAN: 56 mL/min — AB (ref >60)
GLUCOSE: 145 mg/dL — AB (ref 65–99)
Potassium: 3.9 mmol/L (ref 3.5–5.1)
SODIUM: 141 mmol/L (ref 135–145)

## 2016-01-08 LAB — CBC
HEMATOCRIT: 38.4 % (ref 35.0–47.0)
Hemoglobin: 12.8 g/dL (ref 12.0–16.0)
MCH: 30.7 pg (ref 26.0–34.0)
MCHC: 33.3 g/dL (ref 32.0–36.0)
MCV: 92.3 fL (ref 80.0–100.0)
Platelets: 192 10*3/uL (ref 150–440)
RBC: 4.16 MIL/uL (ref 3.80–5.20)
RDW: 13.6 % (ref 11.5–14.5)
WBC: 7.7 10*3/uL (ref 3.6–11.0)

## 2016-01-08 LAB — TROPONIN I
TROPONIN I: 0.09 ng/mL — AB (ref <0.031)
Troponin I: 0.1 ng/mL — ABNORMAL HIGH (ref <0.031)

## 2016-01-08 MED ORDER — ENOXAPARIN SODIUM 40 MG/0.4ML ~~LOC~~ SOLN
40.0000 mg | SUBCUTANEOUS | Status: DC
  Administered 2016-01-09: 40 mg via SUBCUTANEOUS
  Filled 2016-01-08: qty 0.4

## 2016-01-08 MED ORDER — ENOXAPARIN SODIUM 80 MG/0.8ML ~~LOC~~ SOLN
1.0000 mg/kg | Freq: Two times a day (BID) | SUBCUTANEOUS | Status: DC
  Administered 2016-01-08: 70 mg via SUBCUTANEOUS
  Filled 2016-01-08 (×3): qty 0.8

## 2016-01-08 NOTE — Progress Notes (Signed)
Pt. Ambulated to bathroom and c/o pain in buttock going down left leg. Pain score 9 out of 10. Pain med administered. Will continue to monitor.

## 2016-01-08 NOTE — Progress Notes (Signed)
Pts. 3rd troponin came back as 0.10. Dr. Estanislado Pandy notified and is putting new orders in computer.

## 2016-01-08 NOTE — Progress Notes (Signed)
[place] [facility] [place] NAME: Katherine [name]    MR#:  [id number]  DATE OF BIRTH:  Mar 16, 1930  SUBJECTIVE:  CHIEF COMPLAINT:   Chief Complaint  Patient presents with  . Leg Pain   Leg and back pain. REVIEW OF SYSTEMS:  CONSTITUTIONAL: No fever, fatigue or weakness.  EYES: No blurred or double vision.  EARS, NOSE, AND THROAT: No tinnitus or ear pain.  RESPIRATORY: No cough, shortness of breath, wheezing or hemoptysis.  CARDIOVASCULAR: No chest pain, orthopnea, edema.  GASTROINTESTINAL: No nausea, vomiting, diarrhea or abdominal pain.  GENITOURINARY: No dysuria, hematuria.  ENDOCRINE: No polyuria, nocturia,  HEMATOLOGY: No anemia, easy bruising or bleeding SKIN: No rash or lesion. MUSCULOSKELETAL: Leg and back pain. NEUROLOGIC: No tingling, numbness, weakness.  PSYCHIATRY: No anxiety or depression.   DRUG ALLERGIES:  No Known Allergies  VITALS:  Blood pressure 139/71, pulse 70, temperature 97.9 F (36.6 C), temperature source Oral, resp. rate 18, height 5\' 2"  (1.575 m), weight 150 lb (68.04 kg), SpO2 96 %.  PHYSICAL EXAMINATION:  GENERAL:  82 y.o.-year-old patient lying in the bed with no acute distress.  EYES: Pupils equal, round, reactive to light and accommodation. No scleral icterus. Extraocular muscles intact.  HEENT: Head atraumatic, normocephalic. Oropharynx and nasopharynx clear.  NECK:  Supple, no jugular venous distention. No thyroid enlargement, no tenderness.  LUNGS: Normal breath sounds bilaterally, no wheezing, rales,rhonchi or crepitation. No use of accessory muscles of respiration.  CARDIOVASCULAR: S1, S2 normal. No murmurs, rubs, or gallops.  ABDOMEN: Soft, nontender, nondistended. Bowel sounds present. No organomegaly or mass.  EXTREMITIES: No pedal edema, cyanosis, or clubbing.  NEUROLOGIC: Cranial nerves II through XII are intact. Muscle strength 3-4/5 in all extremities. Sensation intact. Gait not checked.   PSYCHIATRIC: The patient is alert and oriented x 3.  SKIN: No obvious rash, lesion, or ulcer.    LABORATORY PANEL:   CBC  Recent Labs Lab 01/08/16 0936  WBC 7.7  HGB 12.8  HCT 38.4  PLT 192   ------------------------------------------------------------------------------------------------------------------  Chemistries   Recent Labs Lab 01/07/16 1549 01/08/16 0936  NA 142 141  K 3.8 3.9  CL 107 109  CO2 26 26  GLUCOSE 112* 145*  BUN 23* 21*  CREATININE 0.89 0.92  CALCIUM 10.0 9.3  AST 36  --   ALT 25  --   ALKPHOS 87  --   BILITOT 0.7  --    ------------------------------------------------------------------------------------------------------------------  Cardiac Enzymes  Recent Labs Lab 01/08/16 0936  TROPONINI 0.09*   ------------------------------------------------------------------------------------------------------------------  RADIOLOGY:  Dg Knee 1-2 Views Left  01/07/2016  CLINICAL DATA:  Left anterior knee pain beginning today.  No injury. EXAM: LEFT KNEE - 1-2 VIEW COMPARISON:  None. FINDINGS: No evidence of fracture, dislocation, or joint effusion. Chondrocalcinosis. No notable degenerative changes for age. Osteopenia. IMPRESSION: No acute finding. Electronically Signed   By: Monte Fantasia M.D.   On: 01/07/2016 20:00   Mr Lumbar Spine Wo Contrast  01/08/2016  CLINICAL DATA:  Low back pain with left thigh pain radiating down to the left foot EXAM: MRI LUMBAR SPINE WITHOUT CONTRAST TECHNIQUE: Multiplanar, multisequence MR imaging of the lumbar spine was performed. No intravenous contrast was administered. COMPARISON:  None. FINDINGS: Segmentation:  Standard. Alignment: 8 mm of anterolisthesis of L4 on L5 secondary to bilateral L4 pars interarticularis defects. The alignment is otherwise anatomic. Vertebrae:  No fracture, evidence of discitis, or bone lesion. Conus medullaris: Extends to the T12-L1 level and appears  normal. Paraspinal and other soft  tissues: Negative. Disc levels: Disc spaces: Degenerative disc disease with disc height loss at L3-4, L4-5 and L5-S1. T11-12: Mild broad-based disc bulge. Mild bilateral facet arthropathy. No foraminal or central canal stenosis. T12-L1: No significant disc bulge. No evidence of neural foraminal stenosis. No central canal stenosis. L1-L2: Mild broad-based disc bulge. Mild bilateral facet arthropathy. No evidence of neural foraminal stenosis. No central canal stenosis. L2-L3: Mild broad-based disc bulge. Moderate right facet arthropathy. Mild right foraminal stenosis. Right lateral recess stenosis. No central canal stenosis. L3-L4: Mild broad-based disc bulge. Mild bilateral facet arthropathy. Mild right foraminal stenosis. Right lateral recess stenosis. No left foraminal stenosis. No central canal stenosis. L4-L5: No disc protrusion. Grade 1 anterolisthesis of L4 on L5. Severe bilateral facet arthropathy and bilateral L4 pars interarticularis defects. Severe bilateral foraminal stenosis. Mild spinal stenosis. L5-S1: Mild broad-based disc bulge. Mild bilateral facet arthropathy. Mild left foraminal stenosis. No central canal stenosis. IMPRESSION: 1. At L4-5 there is grade 1 anterolisthesis of L4 on L5. Severe bilateral facet arthropathy and bilateral L4 pars interarticularis defects. Severe bilateral foraminal stenosis. Mild spinal stenosis. 2. At L2-3 there is a mild broad-based disc bulge. Moderate right facet arthropathy. Mild right foraminal stenosis. Right lateral recess stenosis. 3. At L3-4 there is a mild Mild broad-based disc bulge. Mild bilateral facet arthropathy. Mild right foraminal stenosis. Right lateral recess stenosis. Electronically Signed   By: Kathreen Devoid   On: 01/08/2016 09:27   Ct Cta Abd/pel W/cm &/or W/o Cm  01/07/2016  ADDENDUM REPORT: 01/07/2016 19:54 ADDENDUM: The there are a few small bilateral sub cm renal cortical hypodensities too small to characterize but likely cysts. Electronically  Signed   By: Marin Olp M.D.   On: 01/07/2016 19:54  01/07/2016  CLINICAL DATA:  Sciatica.  Possible dehydration. EXAM: CTA ABDOMEN AND PELVIS wITHOUT AND WITH CONTRAST TECHNIQUE: Multidetector CT imaging of the abdomen and pelvis was performed using the standard protocol during bolus administration of intravenous contrast. Multiplanar reconstructed images and MIPs were obtained and reviewed to evaluate the vascular anatomy. CONTRAST:  100 mL Isovue 370 IV COMPARISON:  None. FINDINGS: Lung bases are within normal. Abdominal images demonstrate multiple splenic granulomas. The liver, pancreas, gallbladder and adrenal glands are normal. Kidneys are normal in size without hydronephrosis or nephrolithiasis. There is a 2.3 cm cyst over the mid pole the right kidney. Ureters are within normal. There is mild calcified plaque over the abdominal aorta iliac arteries. Mild focal stenosis at the takeoff of the celiac axis for comparison Appendix is not well visualized. Minimal diverticulosis of the colon which is otherwise within normal. Small bowel is within normal. Mesentery is unremarkable without inflammatory change or free fluid. Pelvic images demonstrate mild bladder distension. Surgical absence of the uterus. No free pelvic fluid. Right hip arthroplasty intact. There are degenerative changes of the spine with multilevel disc disease over the lumbosacral spine mean grade 1-2 anterolisthesis of L4 on L5. Previous L3-L5 laminectomy. Several metallic densities over the inguinal regions. Review of the MIP images confirms the above findings. IMPRESSION: No acute findings in the abdomen/ pelvis. 2.3 cm right renal cyst. Minimal diverticulosis of the colon. Significant degenerative changes with multilevel disc disease over the lumbosacral spine. Grade 1-2 anterolisthesis L4 on L5. Electronically Signed: By: Marin Olp M.D. On: 01/07/2016 18:26    EKG:   Orders placed or performed during the hospital encounter of  01/07/16  . ED EKG  . ED EKG  . EKG  12-Lead  . EKG 12-Lead    ASSESSMENT AND PLAN:   Katherine [name] is a 82 y.o. female with a known history of CAD with single-vessel disease, on medical management, ischemic cardiomyopathy and congestive heart failure with EF of 35% on cardiac catheterization in March 2017, COPD, degenerative arthritis, fibromyalgia, CK D stage III, hypertension presents from home secondary to worsening left leg pain and low back pain.  #1 left leg pain- could be sciatica versus referred pain to the left thigh. At L4-5 there is grade 1 anterolisthesis of L4 on L5. Severe bilateral facet arthropathy and bilateral L4 pars interarticularis defects. Severe bilateral foraminal stenosis. Left knee x-rays normal.   Continue her baclofen, IV and oral pain medications. Lidoderm patch applied. -Prednisone which was started recently is being continued for her sciatica possibly. F/u  orthopedics consult. Physical therapy consult: HHPT.  #2 CAD-recent admission to [facility] in March 2017 with STEMI. Cardiac catheterization revealed occlusive LAD with collaterals. Being followed by Dr. Fletcher Anon -Continue aspirin, Plavix, lisinopril and Coreg. Hold statin with muscle symptoms  #3 Ischemic cardiomyopathy with chronic combined heart failure. Stable.  #4 hyperlipidemia-continue her Lovaza. Hold high dose statin with her myopathy symptoms until other causes found. Last known LDL 48 Also for her muscle spasms in lower extremities- [place] was done as outpt- negative for DVT and ABIs were normal.  #5 hypertension- low-dose Coreg, Norvasc and lisinopril. Monitor especially with pain medications.  #6 DVT prophylaxis-Ted's and SCDs for now. In case patient needs any procedures after the MRI.   All the records are reviewed and case discussed with Care Management/Social Workerr. Management plans discussed with the patient, her sister and they are in agreement.  CODE STATUS: full  code.  TOTAL TIME TAKING CARE OF THIS PATIENT: 35 minutes.  Greater than 50% time was spent on coordination of care and face-to-face counseling.  POSSIBLE D/C IN 1 DAYS, DEPENDING ON CLINICAL CONDITION.   Demetrios Loll M.D on 01/08/2016 at 3:15 PM  Between 7am to 6pm - Pager - [phone]  After 6pm go to [email or web address] - password [email or web address]  [facility]  Office  [phone]  CC: Primary care physician; No PCP Per Patient

## 2016-01-08 NOTE — Progress Notes (Signed)
PT Cancellation Note  Patient Details Name: Katherine [name] MRN: [id number] DOB: 20-Aug-1929   Cancelled Treatment:    Reason Eval/Treat Not Completed: Other (comment). Treatment attempted twice, first attempt pt out for MRI, 2nd attempt pt eating breakfast and asked therapist to return. Will re-attempt, time permitting.   Criselda Starke 01/08/2016, 9:30 AM  Greggory Stallion, PT, DPT [phone]

## 2016-01-08 NOTE — Consult Note (Signed)
Cardiology Consultation Note  Patient ID: Katherine [name], MRN: [id number], DOB/AGE: [birth year] 80 y.o. Admit date: 01/07/2016   Date of Consult: 01/08/2016 Primary Physician: No PCP Per Patient Primary Cardiologist: M. Fletcher Anon, MD Requesting Physician: Neta Mends, MD  Chief Complaint: Left leg pain and low back pain Reason for Consult: Elevated troponin   HPI: 82 y.o. female with h/o CAD s/p remote MI s/p recent NSTEMI in March 2017 with chronically occluded LAD being seen and medical management advised, chronic systolic CHF, pulmonary HTN, history of CKD, COPD who presented to [facility] ED on 5/22 as below and again on 5/24 with left leg pain and low back pain. Never with chest pain or SOB. Cardiology is consulted for mildly elevated troponin without cardiac symptoms.  She is from [place] but visiting in the [place] area with her sister currently. She was recently evaluated at the [facility] emergency room secondary to chest pain and shortness of breath with subsequent finding of mild troponin elevation and creatinine of 1.56. She underwent stress testing revealing anterior scar with minimal ischemia and severe LV dysfunction.   On March 15, she developed recurrent chest pain radiating to her neck, associated with nausea, vomiting, and dyspnea. She presented to the [facility] emergency department where she had non-ST elevation MI peak troponin 3.22.She underwent diagnostic catheterization on March 16 revealing a chronic total occlusion of the LAD with moderate left circumflex disease and otherwise minor irregularities. Fractional flow reserve was performed within the left circumflex and was normal at 0.92. Medical therapy was recommended. EF was 35% and right heart catheterization revealed pulmonary hypertension and elevated filling pressures with a pulmonary capillary wedge pressure of 22. She was treated with 1 dose of IV Lasix and this was subsequently transitioned to oral Lasix. We also titrated  beta blocker therapy while adding ACE inhibitor and weaning off her previous home dose of clonidine. Echocardiogram showed an EF 35-40% with grade 1 diastolic dysfunction.  She was rehospitalized on March 31 at [facility] after she presented with a fall. She was found to have acute renal failure with a creatinine of 2. This improved with gentle hydration. She bruised her right hip but there was no evidence of fracture.   Patient recently presented to [facility] ED on 5/22 with left leg pain and was diagnosed with sciatica. She was discharged home from the ED. She returned to [facility] on 5/24 with continued 4 day history of low back pain that radiated to there left lower leg and foot. She suffered a fall one month ago and complained of right hip pain with engative xrays. She has been compenstating with ambulation. Her leg pain has been getting worse, thus she came to the hospital. CTA was done of her abdomen and pelvis which was negative. MRI of her lower back showed grafe 1 anterolisthesis of L4 and L5, severe bilateral facet arthropathy and bilateral L4 pars interarticularis defects. There was severe bilateral foraminal stenosis with mild spinal stenosis. At L2-L4 there was mild broad-based disc bulgeShe never complained of and chest pain or SOB. Though, troponin was checked for uncertain reasons given the patient's complaint of leg pain and low back pain.   Upon the patient's arrival to  Hospital they were found to have mildly elevated, flat trending troponin of 0.09-->0.10-->0.09, SCr 0.89, K+ 3.8, initial WBC 11.7-->7.7. ECG showed NSR, 85 bpm, prolonged QT, prior anterior infarct, CXR not done. Her main concern right now is getting her admission covered by insurance. No symptoms concerning for angina.  Past Medical History  Diagnosis Date  . Chronic combined systolic and diastolic heart failure (HCC)     EF 35-40%, Akinesis of mid-apicalanteroseptal wall & AK of apex, Gr 1DD.  Marland Kitchen Hypertensive heart disease   . COPD  (chronic obstructive pulmonary disease) ([place])     worked in a [employer]  . Ischemic cardiomyopathy   . CKD (chronic kidney disease), stage III   . Occlusive coronary artery disease requiring drug therapy     a. Remote MI - never sought treatment when it occurred, shows up old on EKG;  b. Cath 10/2015 - 100% pLAD CTO with 80% D1, prox Cx 60% - EF ~35%  . Fibromyalgia   . Hypertension   . CHF (congestive heart failure) ([place])   . CAD (coronary artery disease)     on medical management      Most Recent Cardiac Studies: Cardiac cath 10/2015: Conclusion     Ost Cx lesion, 50% stenosed.  Prox Cx lesion, 60% stenosed.  Ost LM to LM lesion, 20% stenosed.  Prox LAD lesion, 100% stenosed.  Ost 1st Diag to 1st Diag lesion, 80% stenosed.  There is moderate to severe left ventricular systolic dysfunction.  1. Severe one-vessel coronary artery disease with occluded proximal LAD with right-to-left and left to left collaterals. Moderate left circumflex stenosis which was not significant by FFR. 2. Moderately severely reduced LV systolic function with an ejection fraction of 35% with akinesis of the distal anterior and apical segments. Moderate mitral regurgitation by ventricular angiography. 3. Right heart catheterization showed mild pulmonary hypertension with moderately elevated filling pressures and mildly reduced cardiac output. Giant V waves noted and on pulmonary capillary wedge pressure tracing suggestive of mitral regurgitation.  Recommendations: Continue medical therapy for coronary artery disease. The patient needs aggressive treatment of ischemic cardiomyopathy and systolic heart failure. We shouldn wean her off clonidine slowly and thus I decreased the dose to 0.1 mg twice daily. I switched metoprolol to carvedilol. Recommend IV diuresis and I initiated furosemide 20 mg IV twice daily. Monitor renal function. If renal function is stable, recommend adding an ACE inhibitor or ARB. Proceed  with an echocardiogram to evaluate mitral regurgitation. The patient might benefit from dual antiplatelet therapy.   Echo 10/2015: Study Conclusions  - Left ventricle: The cavity size was normal. There was mild focal  basal hypertrophy of the septum. Systolic function was moderately  reduced. The estimated ejection fraction was in the range of 35%  to 40%. There is akinesis of the mid-apicalanteroseptal  myocardium. There is akinesis of the apical myocardium. Doppler  parameters are consistent with abnormal left ventricular  relaxation (grade 1 diastolic dysfunction). - Aortic valve: There was trivial regurgitation.  Impressions:  - Mid/distal anteroseptal and apical akinesis with overall moderate  LV dysfunction; no apical thrombus using definity; grade 1  diastolic dysfunction; trace AI and TR.   Surgical History:  Past Surgical History  Procedure Laterality Date  . Shoulder surgery    . Cardiac catheterization N/A 10/30/2015    Procedure: Right/Left Heart Cath and Coronary Angiography;  Surgeon: Wellington Hampshire, MD;  Location: [place] [facility];  Service: Cardiovascular;  Laterality: N/A;  . Cardiac catheterization N/A 10/30/2015    Procedure: Intravascular Pressure Wire/FFR Study;  Surgeon: Wellington Hampshire, MD;  Location: [place] [facility];  Service: Cardiovascular;  Laterality: N/A;     Home Meds: Prior to Admission medications   Medication Sig Start Date End Date Taking? Authorizing Provider  amLODipine (NORVASC)  5 MG tablet Take 1 tablet (5 mg total) by mouth daily. 12/26/15  Yes Wellington Hampshire, MD  aspirin EC 81 MG tablet Take 81 mg by mouth daily.   Yes Historical Provider, MD  atorvastatin (LIPITOR) 80 MG tablet Take 1 tablet (80 mg total) by mouth daily at 6 PM. 11/14/15  Yes Imogene Burn, PA-C  baclofen (LIORESAL) 10 MG tablet Take 1 tablet (10 mg total) by mouth 3 (three) times daily. 01/05/16  Yes Victorino Dike, FNP  carvedilol (COREG) 6.25 MG  tablet Take 1 tablet (6.25 mg total) by mouth 2 (two) times daily with a meal. 11/14/15  Yes Imogene Burn, PA-C  clopidogrel (PLAVIX) 75 MG tablet Take 1 tablet (75 mg total) by mouth daily. 11/14/15  Yes Imogene Burn, PA-C  furosemide (LASIX) 20 MG tablet Take 1 tablet (20 mg total) by mouth every other day. 12/18/15  Yes Wellington Hampshire, MD  lisinopril (PRINIVIL,ZESTRIL) 20 MG tablet Take 1 tablet (20 mg total) by mouth daily. 12/18/15  Yes Wellington Hampshire, MD  nitroGLYCERIN (NITROSTAT) 0.4 MG SL tablet Place 1 tablet (0.4 mg total) under the tongue every 5 (five) minutes as needed for chest pain. 11/14/15  Yes Imogene Burn, PA-C  omega-3 acid ethyl esters (LOVAZA) 1 g capsule Take 1 g by mouth daily.   Yes Historical Provider, MD  oxyCODONE (OXY IR/ROXICODONE) 5 MG immediate release tablet Take 5 mg by mouth every 8 (eight) hours as needed for severe pain.   Yes Historical Provider, MD  pantoprazole (PROTONIX) 40 MG tablet Take 1 tablet (40 mg total) by mouth daily. 11/02/15  Yes Rogelia Mire, NP  predniSONE (DELTASONE) 10 MG tablet Take 10-60 mg by mouth daily with breakfast. Pt is to take as a taper:  Six tablets on day 1, five tablets on day 2, four tablets on day 3, three tablets on day 4, two tablets on day 5, one tablet on day 6, then stop.   Yes Historical Provider, MD    Inpatient Medications:  . amLODipine  5 mg Oral Daily  . aspirin EC  81 mg Oral Daily  . carvedilol  6.25 mg Oral BID WC  . clopidogrel  75 mg Oral Daily  . docusate sodium  100 mg Oral BID  . enoxaparin (LOVENOX) injection  1 mg/kg Subcutaneous BID  . lidocaine  1 patch Transdermal Q24H  . lisinopril  20 mg Oral Daily  . omega-3 acid ethyl esters  1 g Oral Daily  . pantoprazole  40 mg Oral Daily  . predniSONE  50 mg Oral Q breakfast   . sodium chloride 75 mL/hr at 01/07/16 2208    Allergies: No Known Allergies  Social History   Social History  . Marital Status: Single    Spouse Name: N/A  .  Number of Children: N/A  . Years of Education: N/A   Occupational History  . Not on file.   Social History Main Topics  . Smoking status: Never Smoker   . Smokeless tobacco: Never Used  . Alcohol Use: No  . Drug Use: No  . Sexual Activity: No   Other Topics Concern  . Not on file   Social History Narrative     Family History  Problem Relation Age of Onset  . Cancer Mother   . CAD Sister   . CAD Brother      Review of Systems: Review of Systems  Constitutional: Positive for weight loss and  malaise/fatigue. Negative for fever, chills and diaphoresis.  HENT: Negative for congestion.   Eyes: Negative for discharge and redness.  Respiratory: Negative for cough, hemoptysis, sputum production, shortness of breath and wheezing.   Cardiovascular: Positive for palpitations. Negative for chest pain, orthopnea, claudication, leg swelling and PND.  Gastrointestinal: Negative for heartburn, nausea, vomiting and abdominal pain.  Musculoskeletal: Positive for myalgias, back pain, joint pain and falls. Negative for neck pain.  Skin: Negative for rash.  Neurological: Positive for weakness. Negative for dizziness, sensory change, speech change, focal weakness and loss of consciousness.  Endo/Heme/Allergies: Does not bruise/bleed easily.  Psychiatric/Behavioral: The patient is not nervous/anxious.   All other systems reviewed and are negative.   Labs:  Recent Labs  01/07/16 1549 01/07/16 2007 01/08/16 0146 01/08/16 0936  TROPONINI 0.07* 0.09* 0.10* 0.09*   Lab Results  Component Value Date   WBC 7.7 01/08/2016   HGB 12.8 01/08/2016   HCT 38.4 01/08/2016   MCV 92.3 01/08/2016   PLT 192 01/08/2016     Recent Labs Lab 01/07/16 1549 01/08/16 0936  NA 142 141  K 3.8 3.9  CL 107 109  CO2 26 26  BUN 23* 21*  CREATININE 0.89 0.92  CALCIUM 10.0 9.3  PROT 7.8  --   BILITOT 0.7  --   ALKPHOS 87  --   ALT 25  --   AST 36  --   GLUCOSE 112* 145*   Lab Results  Component  Value Date   CHOL 114 12/16/2015   HDL 41 12/16/2015   LDLCALC 48 12/16/2015   TRIG 126 12/16/2015   No results found for: DDIMER  Radiology/Studies:  Dg Knee 1-2 Views Left  01/07/2016  CLINICAL DATA:  Left anterior knee pain beginning today.  No injury. EXAM: LEFT KNEE - 1-2 VIEW COMPARISON:  None. FINDINGS: No evidence of fracture, dislocation, or joint effusion. Chondrocalcinosis. No notable degenerative changes for age. Osteopenia. IMPRESSION: No acute finding. Electronically Signed   By: Monte Fantasia M.D.   On: 01/07/2016 20:00   Mr Lumbar Spine Wo Contrast  01/08/2016  CLINICAL DATA:  Low back pain with left thigh pain radiating down to the left foot EXAM: MRI LUMBAR SPINE WITHOUT CONTRAST TECHNIQUE: Multiplanar, multisequence MR imaging of the lumbar spine was performed. No intravenous contrast was administered. COMPARISON:  None. FINDINGS: Segmentation:  Standard. Alignment: 8 mm of anterolisthesis of L4 on L5 secondary to bilateral L4 pars interarticularis defects. The alignment is otherwise anatomic. Vertebrae:  No fracture, evidence of discitis, or bone lesion. Conus medullaris: Extends to the T12-L1 level and appears normal. Paraspinal and other soft tissues: Negative. Disc levels: Disc spaces: Degenerative disc disease with disc height loss at L3-4, L4-5 and L5-S1. T11-12: Mild broad-based disc bulge. Mild bilateral facet arthropathy. No foraminal or central canal stenosis. T12-L1: No significant disc bulge. No evidence of neural foraminal stenosis. No central canal stenosis. L1-L2: Mild broad-based disc bulge. Mild bilateral facet arthropathy. No evidence of neural foraminal stenosis. No central canal stenosis. L2-L3: Mild broad-based disc bulge. Moderate right facet arthropathy. Mild right foraminal stenosis. Right lateral recess stenosis. No central canal stenosis. L3-L4: Mild broad-based disc bulge. Mild bilateral facet arthropathy. Mild right foraminal stenosis. Right lateral  recess stenosis. No left foraminal stenosis. No central canal stenosis. L4-L5: No disc protrusion. Grade 1 anterolisthesis of L4 on L5. Severe bilateral facet arthropathy and bilateral L4 pars interarticularis defects. Severe bilateral foraminal stenosis. Mild spinal stenosis. L5-S1: Mild broad-based disc bulge. Mild bilateral facet arthropathy.  Mild left foraminal stenosis. No central canal stenosis. IMPRESSION: 1. At L4-5 there is grade 1 anterolisthesis of L4 on L5. Severe bilateral facet arthropathy and bilateral L4 pars interarticularis defects. Severe bilateral foraminal stenosis. Mild spinal stenosis. 2. At L2-3 there is a mild broad-based disc bulge. Moderate right facet arthropathy. Mild right foraminal stenosis. Right lateral recess stenosis. 3. At L3-4 there is a mild Mild broad-based disc bulge. Mild bilateral facet arthropathy. Mild right foraminal stenosis. Right lateral recess stenosis. Electronically Signed   By: Kathreen Devoid   On: 01/08/2016 09:27   US Venous Img Lower Bilateral  12/12/2015  CLINICAL DATA:  82 year old female with a history of lower extremity cramping EXAM: BILATERAL LOWER EXTREMITY VENOUS DOPPLER ULTRASOUND TECHNIQUE: Gray-scale sonography with graded compression, as well as color Doppler and duplex ultrasound were performed to evaluate the lower extremity deep venous systems from the level of the common femoral vein and including the common femoral, femoral, profunda femoral, popliteal and calf veins including the posterior tibial, peroneal and gastrocnemius veins when visible. The superficial great saphenous vein was also interrogated. Spectral Doppler was utilized to evaluate flow at rest and with distal augmentation maneuvers in the common femoral, femoral and popliteal veins. COMPARISON:  None. FINDINGS: RIGHT LOWER EXTREMITY Common Femoral Vein: No evidence of thrombus. Normal compressibility, respiratory phasicity and response to augmentation. Saphenofemoral Junction: No  evidence of thrombus. Normal compressibility and flow on color Doppler imaging. Profunda Femoral Vein: No evidence of thrombus. Normal compressibility and flow on color Doppler imaging. Femoral Vein: No evidence of thrombus. Normal compressibility, respiratory phasicity and response to augmentation. Popliteal Vein: No evidence of thrombus. Normal compressibility, respiratory phasicity and response to augmentation. Calf Veins: No evidence of thrombus. Normal compressibility and flow on color Doppler imaging. Superficial Great Saphenous Vein: No evidence of thrombus. Normal compressibility and flow on color Doppler imaging. Other Findings:  None. LEFT LOWER EXTREMITY Common Femoral Vein: No evidence of thrombus. Normal compressibility, respiratory phasicity and response to augmentation. Saphenofemoral Junction: No evidence of thrombus. Normal compressibility and flow on color Doppler imaging. Profunda Femoral Vein: No evidence of thrombus. Normal compressibility and flow on color Doppler imaging. Femoral Vein: No evidence of thrombus. Normal compressibility, respiratory phasicity and response to augmentation. Popliteal Vein: No evidence of thrombus. Normal compressibility, respiratory phasicity and response to augmentation. Calf Veins: No evidence of thrombus. Normal compressibility and flow on color Doppler imaging. Superficial Great Saphenous Vein: No evidence of thrombus. Normal compressibility and flow on color Doppler imaging. Other Findings:  None. IMPRESSION: Sonographic survey of the bilateral lower extremities negative for DVT. Signed, Dulcy Fanny. Earleen Newport, DO [facility] Radiology Electronically Signed   By: Corrie Mckusick D.O.   On: 12/12/2015 17:24   Dg Hip Unilat With Pelvis 2-3 Views Left  01/05/2016  CLINICAL DATA:  Left hip pain, difficulty bearing weight. EXAM: DG HIP (WITH OR WITHOUT PELVIS) 2-3V LEFT COMPARISON:  None. FINDINGS: Single view of the pelvis  and two views of the left hip are provided. Patient is status post total right hip arthroplasty. Right hip replacement hardware appears intact and normally positioned. No fracture line or displaced fracture fragment. Left femoral head appears well positioned relative to the acetabulum. No significant degenerative change at the left hip. Degenerative changes are noted within the lower lumbar spine, at least moderate in degree. Soft tissues about the pelvis and left hip are unremarkable. IMPRESSION: 1. No acute findings.  No osseous fracture or dislocation. 2. Status post total  right hip replacement. Hardware appears intact and appropriately positioned. 3. Degenerative changes within the lower lumbar spine, at least moderate in degree. No significant degenerative change at the left hip joint. Electronically Signed   By: Franki Cabot M.D.   On: 01/05/2016 13:38   Ct Cta Abd/pel W/cm &/or W/o Cm  01/07/2016  ADDENDUM REPORT: 01/07/2016 19:54 ADDENDUM: The there are a few small bilateral sub cm renal cortical hypodensities too small to characterize but likely cysts. Electronically Signed   By: Marin Olp M.D.   On: 01/07/2016 19:54  01/07/2016  CLINICAL DATA:  Sciatica.  Possible dehydration. EXAM: CTA ABDOMEN AND PELVIS wITHOUT AND WITH CONTRAST TECHNIQUE: Multidetector CT imaging of the abdomen and pelvis was performed using the standard protocol during bolus administration of intravenous contrast. Multiplanar reconstructed images and MIPs were obtained and reviewed to evaluate the vascular anatomy. CONTRAST:  100 mL Isovue 370 IV COMPARISON:  None. FINDINGS: Lung bases are within normal. Abdominal images demonstrate multiple splenic granulomas. The liver, pancreas, gallbladder and adrenal glands are normal. Kidneys are normal in size without hydronephrosis or nephrolithiasis. There is a 2.3 cm cyst over the mid pole the right kidney. Ureters are within normal. There is mild calcified plaque over the abdominal  aorta iliac arteries. Mild focal stenosis at the takeoff of the celiac axis for comparison Appendix is not well visualized. Minimal diverticulosis of the colon which is otherwise within normal. Small bowel is within normal. Mesentery is unremarkable without inflammatory change or free fluid. Pelvic images demonstrate mild bladder distension. Surgical absence of the uterus. No free pelvic fluid. Right hip arthroplasty intact. There are degenerative changes of the spine with multilevel disc disease over the lumbosacral spine mean grade 1-2 anterolisthesis of L4 on L5. Previous L3-L5 laminectomy. Several metallic densities over the inguinal regions. Review of the MIP images confirms the above findings. IMPRESSION: No acute findings in the abdomen/ pelvis. 2.3 cm right renal cyst. Minimal diverticulosis of the colon. Significant degenerative changes with multilevel disc disease over the lumbosacral spine. Grade 1-2 anterolisthesis L4 on L5. Electronically Signed: By: Marin Olp M.D. On: 01/07/2016 18:26    EKG: NSR, 85 bpm, porlonged QT, prior anterior infarct Telemetry: NSR, 60's to 80's bpm  Weights: Filed Weights   01/07/16 1108  Weight: 150 lb (68.04 kg)     Physical Exam: Blood pressure 131/58, pulse 65, temperature 98.7 F (37.1 C), temperature source Oral, resp. rate 18, height 5\' 2"  (1.575 m), weight 150 lb (68.04 kg), SpO2 99 %. Body mass index is 27.43 kg/(m^2). General: Well developed, well nourished, in no acute distress. Head: Normocephalic, atraumatic, sclera non-icteric, no xanthomas, nares are without discharge.  Neck: Negative for carotid bruits. JVD not elevated. Lungs: Clear bilaterally to auscultation without wheezes, rales, or rhonchi. Breathing is unlabored. Heart: RRR with S1 S2. No murmurs, rubs, or gallops appreciated. Abdomen: Soft, non-tender, non-distended with normoactive bowel sounds. No hepatomegaly. No rebound/guarding. No obvious abdominal masses. Msk:  Strength  and tone appear normal for age. Extremities: No clubbing or cyanosis. No edema.  Distal pedal pulses are 2+ and equal bilaterally. Neuro: Alert and oriented X 3. No facial asymmetry. No focal deficit. Moves all extremities spontaneously. Psych:  Responds to questions appropriately with a normal affect.    Assessment and Plan:  Principal Problem:   Sciatica Active Problems:   Left leg pain   Low back pain   NSTEMI (non-ST elevated myocardial infarction) ([place]) - 10/30/2015   Palpitations   CKD (chronic  kidney disease), stage III   Cardiomyopathy, ischemic   Chronic combined systolic and diastolic heart failure ([place])    1. Elevated troponin: -Mildly elevated and flat trending. With a peak of 0.10 and already trending down -Never with any symptoms to suggest cardiac etiology -It is unclear why troponin was checked  -Recent cardiac cath as above with medical Rx being advised  -No plans for ischemic work up at this time  2. CAD s/p NSTEMI 10/2015: -Medical Rx advised by cardiac cath at that time -Continue aspirin, Plavix, Coreg, lisinopril -As above  3. Chronic systolic CHF: -She does not appear to be volume overloaded at this time -Continue CHF medications as above including low-dose Lasix -Consider switching lisinopril to [facility], will require 36 hour wash out of ACEi -Not on spiro given history of hyperkalemia  4. Palpitations: -Given her cardiomyopathy she could be at risk for ventricular ectopy -None seen on tele -Will check magnesium and TSH -Replete K+ -Could plan for outpatient cardiac monitoring -If increased ventricular ectopy is seen she may be a candidate for LifeVest followed by repeat echo as an outpatient and ICD if EF <35%  4. Recent mechanical fall/right and left leg pain/sciatica: -PT evaluation -Uses a walker -Per IM and orthopedics  5. HTN: -Controlled -Continue current medications  6. HLD: -High-dose statin -Most recent LDL of 48  7. Patient's  main concern this morning is getting her hospital admission covered through her insurance -Defer to clerical    Signed, Christell Faith, PA-C Pager: [phone] 01/08/2016, 11:06 AM

## 2016-01-08 NOTE — Evaluation (Signed)
Physical Therapy Evaluation Patient Details Name: Katherine [name] MRN: [id number] DOB: Aug 16, 1930 Today's Date: 01/08/2016   History of Present Illness  Pt admitted for complaints of leg pain down the L LE to foot. Pt lives in [place], however is currently living with her sister. Pt with history of CAD, ischemic cardiomyopathy, and COPD. Pt recently had cardiac cath done in March 2017. Pt with trending up troponin, however is cleared to work with PT by cardio.   Clinical Impression  Pt is a pleasant 82 year old female who was admitted for complaints of leg pain. Pt performs bed mobility, transfers, and ambulation with cga and rw. Pt demonstrates deficits with strength/mobility/pain. Pt very close to baseline level, unfamiliar with rw as she uses rollater at home. Pt motivated to perform PT. Would benefit from skilled PT to address above deficits and promote optimal return to PLOF. Recommend transition to [facility] upon discharge from acute hospitalization.       Follow Up Recommendations Home health PT    Equipment Recommendations       Recommendations for Other Services       Precautions / Restrictions Precautions Precautions: Fall Restrictions Weight Bearing Restrictions: No      Mobility  Bed Mobility Overal bed mobility: Needs Assistance Bed Mobility: Supine to Sit     Supine to sit: Min guard     General bed mobility comments: cues given for sequencing. Safe technique performed. Pt cued for scooting out towards EOB. Once seated, pt able to sit with supervision  Transfers Overall transfer level: Needs assistance Equipment used: Rolling walker (2 wheeled) Transfers: Sit to/from Stand Sit to Stand: Min guard         General transfer comment: Pt slightly impulsive and tries to stand prior to therapist instruction. Pt cued for pushing from seated surface. Once standing, no LOB noted  Ambulation/Gait Ambulation/Gait assistance: Min guard Ambulation Distance (Feet): 3  Feet Assistive device: Rolling walker (2 wheeled) Gait Pattern/deviations: Step-to pattern     General Gait Details: ambulated using rw and slow step to gait pattern. Safe technique performed, however pt slightly unsteady and requires cues for correct use of AD.  Stairs            Wheelchair Mobility    Modified Rankin (Stroke Patients Only)       Balance Overall balance assessment: Needs assistance Sitting-balance support: Feet supported Sitting balance-Leahy Scale: Normal     Standing balance support: Bilateral upper extremity supported Standing balance-Leahy Scale: Fair                               Pertinent Vitals/Pain Pain Assessment: Faces Faces Pain Scale: Hurts a little bit Pain Location: L LE Pain Descriptors / Indicators: Aching Pain Intervention(s): Limited activity within patient's tolerance    Home Living Family/patient expects to be discharged to:: Private residence Living Arrangements:  (living with her sister at this time) Available Help at Discharge: Family Type of Home: House Home Access: Level entry     Home Layout: One level Home Equipment: Environmental consultant - 4 wheels      Prior Function Level of Independence: Independent with assistive device(s)         Comments: has been using the rollater at all times. Able to ambulate household distances. Pt recently mopped 7 rooms earlier this week prior to onset of pain.     Hand Dominance        Extremity/Trunk  Assessment   Upper Extremity Assessment:  (R UE grossly 4+/5; L UE grossly 5/5)           Lower Extremity Assessment: Generalized weakness (R LE grossly 4/5; L LE grossly 4+/5)         Communication   Communication: No difficulties  Cognition Arousal/Alertness: Awake/alert Behavior During Therapy: WFL for tasks assessed/performed Overall Cognitive Status: Within Functional Limits for tasks assessed                      General Comments      Exercises  Other Exercises Other Exercises: Pt performed seated ther-ex on B LE including ankle pumps, SLRs, hip abd/add, and SAQ. All ther-ex performed x 10 reps with cga. Safe technique performed      Assessment/Plan    PT Assessment Patient needs continued PT services  PT Diagnosis Difficulty walking;Generalized weakness;Acute pain   PT Problem List Decreased strength;Decreased activity tolerance;Decreased balance;Decreased mobility  PT Treatment Interventions Gait training;Therapeutic exercise   PT Goals (Current goals can be found in the Care Plan section) Acute Rehab PT Goals Patient Stated Goal: to go home PT Goal Formulation: With patient Time For Goal Achievement: 01/22/16 Potential to Achieve Goals: Good    Frequency Min 2X/week   Barriers to discharge        Co-evaluation               End of Session Equipment Utilized During Treatment: Gait belt Activity Tolerance: Patient tolerated treatment well Patient left: in chair;with chair alarm set Nurse Communication: Mobility status    Functional Assessment Tool Used: clinical judgement Functional Limitation: Mobility: Walking and moving around Mobility: Walking and Moving Around Current Status [id number]): At least 1 percent but less than 20 percent impaired, limited or restricted Mobility: Walking and Moving Around Goal Status [phone]): 0 percent impaired, limited or restricted    Time: 1122-1158 PT Time Calculation (min) (ACUTE ONLY): 36 min   Charges:   PT Evaluation $PT Eval Moderate Complexity: 1 Procedure PT Treatments $Therapeutic Exercise: 8-22 mins   PT G Codes:   PT G-Codes **NOT FOR INPATIENT CLASS** Functional Assessment Tool Used: clinical judgement Functional Limitation: Mobility: Walking and moving around Mobility: Walking and Moving Around Current Status [id number]): At least 1 percent but less than 20 percent impaired, limited or restricted Mobility: Walking and Moving Around Goal Status [phone]): 0  percent impaired, limited or restricted    Emeril Stille 01/08/2016, 4:42 PM Greggory Stallion, PT, DPT [phone]

## 2016-01-08 NOTE — Care Management Note (Addendum)
Case Management Note  Patient Details  Name: LATISA BELAY MRN: [id number] Date of Birth: 14-Aug-1930  Subjective/Objective:                  Met with patient to discuss discharge planning. She is visiting from [place] and staying with her sister in [place]. She has a rollator she ambulates with. Her PCP is also in [place]- Dr Lujean Amel. Her sister's name is Legrand Rams [phone]. Patient was recently seen by [facility] for HHPT and will need new orders at discharge if needed again. Dr. Bridgett Larsson aware.  Action/Plan: RNCM updated Jason with [facility]. RNCM will continue to follow.   Expected Discharge Date:  01/09/16               Expected Discharge Plan:     In-House Referral:     Discharge planning Services  CM Consult  Post Acute Care Choice:  Home Health Choice offered to:  Patient  DME Arranged:    DME Agency:     HH Arranged:    [place] Agency:  [facility]  Status of Service:  In process, will continue to follow  Medicare Important Message Given:    Date Medicare IM Given:    Medicare IM give by:    Date Additional Medicare IM Given:    Additional Medicare Important Message give by:     If discussed at [place] of Stay Meetings, dates discussed:    Additional Comments:  Marshell Garfinkel, RN 01/08/2016, 11:17 AM

## 2016-01-08 NOTE — Consult Note (Signed)
ORTHOPAEDIC CONSULTATION  REQUESTING PHYSICIAN: Demetrios Loll, MD  Chief Complaint: Lower extremity pain  HPI: Katherine [name] is a 82 y.o. female was admitted for severe pain and lower extremities. Pain radiating down the lower extremities into her feet. Patient denies numbness or tingling or lower extremity paralysis. Patient has a history of previous spine surgery. 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 Her symptoms just prior to her surgery included difficulty walking. The patient states that her lower extremity pain prior to admission was severe. 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 This is her baseline. Patient also has had a prior right total hip arthroplasty. She states that she is required the use of a walker or cane for ambulation since this surgery. She believes that her right lower extremity is longer than the left. She states that her right lower extremity was lengthened during her hip surgery. Her total hip arthroplasty was performed in 2011. Patient also explains that she has chronic low back pain. She is staying with her sister and with that he was recently had spine surgery. Patient states that her lower extremity pain has dramatically improved since her admission.  Past Medical History  Diagnosis Date  . Chronic combined systolic and diastolic heart failure (HCC)     EF 35-40%, Akinesis of mid-apicalanteroseptal wall & AK of apex, Gr 1DD.  [place] [facility] Hypertensive heart disease   . COPD (chronic obstructive pulmonary disease) ([facility] [place])     worked in a [employer]  . Ischemic cardiomyopathy   . CKD (chronic kidney disease), stage III   . Occlusive coronary artery disease requiring drug therapy     a. Remote MI - never sought treatment when it occurred, shows up old on EKG;  b. Cath 10/2015 - 100% pLAD CTO with 80% D1, prox Cx 60% - EF ~35%  . Fibromyalgia   . Hypertension   . CHF  (congestive heart failure) ([place])   . CAD (coronary artery disease)     on medical management   Past Surgical History  Procedure Laterality Date  . Shoulder surgery    . Cardiac catheterization N/A 10/30/2015    Procedure: Right/Left Heart Cath and Coronary Angiography;  Surgeon: Wellington Hampshire, MD;  Location: [facility];  Service: Cardiovascular;  Laterality: N/A;  . Cardiac catheterization N/A 10/30/2015    Procedure: Intravascular Pressure Wire/FFR Study;  Surgeon: Wellington Hampshire, MD;  Location: [facility];  Service: Cardiovascular;  Laterality: N/A;   Social History   Social History  . Marital Status: Single    Spouse Name: N/A  . Number of Children: N/A  . Years of Education: N/A   Social History Main Topics  . Smoking status: Never Smoker   . Smokeless tobacco: Never Used  . Alcohol Use: No  . Drug Use: No  . Sexual Activity: No   Other Topics Concern  . None   Social History Narrative   Family History  Problem Relation Age of Onset  . Cancer Mother   . CAD Sister   . CAD Brother    No Known Allergies Prior to Admission medications   Medication Sig Start Date End Date Taking? Authorizing Provider  amLODipine (NORVASC) 5 MG tablet Take 1 tablet (5 mg total) by mouth daily. 12/26/15  Yes Wellington Hampshire, MD  aspirin EC 81 MG tablet Take 81 mg by mouth daily.  Yes Historical Provider, MD  atorvastatin (LIPITOR) 80 MG tablet Take 1 tablet (80 mg total) by mouth daily at 6 PM. 11/14/15  Yes Imogene Burn, PA-C  baclofen (LIORESAL) 10 MG tablet Take 1 tablet (10 mg total) by mouth 3 (three) times daily. 01/05/16  Yes Victorino Dike, FNP  carvedilol (COREG) 6.25 MG tablet Take 1 tablet (6.25 mg total) by mouth 2 (two) times daily with a meal. 11/14/15  Yes Imogene Burn, PA-C  clopidogrel (PLAVIX) 75 MG tablet Take 1 tablet (75 mg total) by mouth daily. 11/14/15  Yes Imogene Burn, PA-C  furosemide (LASIX) 20 MG tablet Take 1 tablet (20 mg total) by  mouth every other day. 12/18/15  Yes Wellington Hampshire, MD  lisinopril (PRINIVIL,ZESTRIL) 20 MG tablet Take 1 tablet (20 mg total) by mouth daily. 12/18/15  Yes Wellington Hampshire, MD  nitroGLYCERIN (NITROSTAT) 0.4 MG SL tablet Place 1 tablet (0.4 mg total) under the tongue every 5 (five) minutes as needed for chest pain. 11/14/15  Yes Imogene Burn, PA-C  omega-3 acid ethyl esters (LOVAZA) 1 g capsule Take 1 g by mouth daily.   Yes Historical Provider, MD  oxyCODONE (OXY IR/ROXICODONE) 5 MG immediate release tablet Take 5 mg by mouth every 8 (eight) hours as needed for severe pain.   Yes Historical Provider, MD  pantoprazole (PROTONIX) 40 MG tablet Take 1 tablet (40 mg total) by mouth daily. 11/02/15  Yes Rogelia Mire, NP  predniSONE (DELTASONE) 10 MG tablet Take 10-60 mg by mouth daily with breakfast. Pt is to take as a taper:  Six tablets on day 1, five tablets on day 2, four tablets on day 3, three tablets on day 4, two tablets on day 5, one tablet on day 6, then stop.   Yes Historical Provider, MD   Dg Knee 1-2 Views Left  01/07/2016  CLINICAL DATA:  Left anterior knee pain beginning today.  No injury. EXAM: LEFT KNEE - 1-2 VIEW COMPARISON:  None. FINDINGS: No evidence of fracture, dislocation, or joint effusion. Chondrocalcinosis. No notable degenerative changes for age. Osteopenia. IMPRESSION: No acute finding. Electronically Signed   By: Monte Fantasia M.D.   On: 01/07/2016 20:00   Mr Lumbar Spine Wo Contrast  01/08/2016  CLINICAL DATA:  Low back pain with left thigh pain radiating down to the left foot EXAM: MRI LUMBAR SPINE WITHOUT CONTRAST TECHNIQUE: Multiplanar, multisequence MR imaging of the lumbar spine was performed. No intravenous contrast was administered. COMPARISON:  None. FINDINGS: Segmentation:  Standard. Alignment: 8 mm of anterolisthesis of L4 on L5 secondary to bilateral L4 pars interarticularis defects. The alignment is otherwise anatomic. Vertebrae:  No fracture, evidence of  discitis, or bone lesion. Conus medullaris: Extends to the T12-L1 level and appears normal. Paraspinal and other soft tissues: Negative. Disc levels: Disc spaces: Degenerative disc disease with disc height loss at L3-4, L4-5 and L5-S1. T11-12: Mild broad-based disc bulge. Mild bilateral facet arthropathy. No foraminal or central canal stenosis. T12-L1: No significant disc bulge. No evidence of neural foraminal stenosis. No central canal stenosis. L1-L2: Mild broad-based disc bulge. Mild bilateral facet arthropathy. No evidence of neural foraminal stenosis. No central canal stenosis. L2-L3: Mild broad-based disc bulge. Moderate right facet arthropathy. Mild right foraminal stenosis. Right lateral recess stenosis. No central canal stenosis. L3-L4: Mild broad-based disc bulge. Mild bilateral facet arthropathy. Mild right foraminal stenosis. Right lateral recess stenosis. No left foraminal stenosis. No central canal stenosis. L4-L5: No disc protrusion. Grade  1 anterolisthesis of L4 on L5. Severe bilateral facet arthropathy and bilateral L4 pars interarticularis defects. Severe bilateral foraminal stenosis. Mild spinal stenosis. L5-S1: Mild broad-based disc bulge. Mild bilateral facet arthropathy. Mild left foraminal stenosis. No central canal stenosis. IMPRESSION: 1. At L4-5 there is grade 1 anterolisthesis of L4 on L5. Severe bilateral facet arthropathy and bilateral L4 pars interarticularis defects. Severe bilateral foraminal stenosis. Mild spinal stenosis. 2. At L2-3 there is a mild broad-based disc bulge. Moderate right facet arthropathy. Mild right foraminal stenosis. Right lateral recess stenosis. 3. At L3-4 there is a mild Mild broad-based disc bulge. Mild bilateral facet arthropathy. Mild right foraminal stenosis. Right lateral recess stenosis. Electronically Signed   By: Kathreen Devoid   On: 01/08/2016 09:27   Ct Cta Abd/pel W/cm &/or W/o Cm  01/07/2016  ADDENDUM REPORT: 01/07/2016 19:54 ADDENDUM: The there  are a few small bilateral sub cm renal cortical hypodensities too small to characterize but likely cysts. Electronically Signed   By: Marin Olp M.D.   On: 01/07/2016 19:54  01/07/2016  CLINICAL DATA:  Sciatica.  Possible dehydration. EXAM: CTA ABDOMEN AND PELVIS wITHOUT AND WITH CONTRAST TECHNIQUE: Multidetector CT imaging of the abdomen and pelvis was performed using the standard protocol during bolus administration of intravenous contrast. Multiplanar reconstructed images and MIPs were obtained and reviewed to evaluate the vascular anatomy. CONTRAST:  100 mL Isovue 370 IV COMPARISON:  None. FINDINGS: Lung bases are within normal. Abdominal images demonstrate multiple splenic granulomas. The liver, pancreas, gallbladder and adrenal glands are normal. Kidneys are normal in size without hydronephrosis or nephrolithiasis. There is a 2.3 cm cyst over the mid pole the right kidney. Ureters are within normal. There is mild calcified plaque over the abdominal aorta iliac arteries. Mild focal stenosis at the takeoff of the celiac axis for comparison Appendix is not well visualized. Minimal diverticulosis of the colon which is otherwise within normal. Small bowel is within normal. Mesentery is unremarkable without inflammatory change or free fluid. Pelvic images demonstrate mild bladder distension. Surgical absence of the uterus. No free pelvic fluid. Right hip arthroplasty intact. There are degenerative changes of the spine with multilevel disc disease over the lumbosacral spine mean grade 1-2 anterolisthesis of L4 on L5. Previous L3-L5 laminectomy. Several metallic densities over the inguinal regions. Review of the MIP images confirms the above findings. IMPRESSION: No acute findings in the abdomen/ pelvis. 2.3 cm right renal cyst. Minimal diverticulosis of the colon. Significant degenerative changes with multilevel disc disease over the lumbosacral spine. Grade 1-2 anterolisthesis L4 on L5. Electronically Signed:  By: Marin Olp M.D. On: 01/07/2016 18:26    Positive ROS: All other systems have been reviewed and were otherwise negative with the exception of those mentioned in the HPI and as above.  Physical Exam: General: Alert, no acute distress  Neurologic: Sensation intact distally  MUSCULOSKELETAL: Lower extremities:  Patient has intact skin. There is no erythema or ecchymosis or swelling. Patient has no pain with internal/external rotation of her hips. She has intact sensation light touch in the bilateral lower extremities with palpable pedal pulses. She has intact motor function with limited range of motion of the right ankle.  Assessment: Bilateral lower knee pain  Plan: Partially the patient's pain has dramatically improved since admission. This is likely due to the prednisone she was receiving. I have reviewed her MRI and the radiology report. The patient has significant facet degenerative joint disease. She has associated broad-based disc bulges which along with the facet  arthropathy is causing right foraminal stenosis at a few levels. Patient's foraminal stenosis is most severe at L4-5. She has anterolisthesis of L4 on L5 which is estimated to be grade 1. I believe that the patient's facet arthritis and associated disc bulges are causing her lower extremity pain. Her symptoms are currently improving and therefore I would not recommend any intervention at this time. If her symptoms worsen she will need to be evaluated by the pain management Department or physiatry for possible corticosteroid injection.    Thornton Park, MD    01/08/2016 6:21 PM

## 2016-01-08 NOTE — Care Management Obs Status (Signed)
[facility] NOTIFICATION   Patient Details  Name: Katherine [name] MRN: [id number] Date of Birth: 16-Feb-1930   Medicare Observation Status Notification Given:  Yes    Marshell Garfinkel, RN 01/08/2016, 8:04 AM

## 2016-01-09 DIAGNOSIS — M79606 Pain in leg, unspecified: Secondary | ICD-10-CM | POA: Diagnosis not present

## 2016-01-09 DIAGNOSIS — I2510 Atherosclerotic heart disease of native coronary artery without angina pectoris: Secondary | ICD-10-CM | POA: Diagnosis not present

## 2016-01-09 DIAGNOSIS — M4800 Spinal stenosis, site unspecified: Secondary | ICD-10-CM | POA: Diagnosis not present

## 2016-01-09 DIAGNOSIS — I509 Heart failure, unspecified: Secondary | ICD-10-CM | POA: Diagnosis not present

## 2016-01-09 MED ORDER — LIDOCAINE 5 % EX PTCH: 1.0000 | MEDICATED_PATCH | CUTANEOUS | Status: DC

## 2016-01-09 MED ORDER — OXYCODONE HCL 5 MG PO TABS: 5.0000 mg | ORAL_TABLET | Freq: Three times a day (TID) | ORAL | Status: DC | PRN

## 2016-01-09 NOTE — Progress Notes (Signed)
[facility] called, patient went into SVT while on the toilet, HR went up to 160. MD notified. HR now 99, patient back to sinus rhythm.   Deri Fuelling, RN

## 2016-01-09 NOTE — Progress Notes (Signed)
Pts. Been in severe pain in her back and left leg throughout the night. Medicated per MAR and pt. Will sleep several hours after pain med administered then wake up in such pain she requested a bed pan to void. Ambulated to bathroom and back to bed with minimal assistance but with a great deal of pain. Will continue to give meds when due and monitor pt.

## 2016-01-09 NOTE — Discharge Instructions (Signed)
Heart healthy diet. Activity as tolerated. HHPT. Pain clinic follow up.

## 2016-01-09 NOTE — Care Management (Signed)
I have notified Katherine [name] with [facility] that patient has discharge in. Patient's HR up to 160 per RN note. Katherine [name] with [facility] updated. No further RNCM needs.

## 2016-01-09 NOTE — Discharge Summary (Signed)
[facility] NAME: Katherine [name]    MR#:  [id number]  DATE OF BIRTH:  05/06/1930  DATE OF ADMISSION:  01/07/2016 ADMITTING PHYSICIAN: Gladstone Lighter, MD  DATE OF DISCHARGE:  01/09/2016  PRIMARY CARE PHYSICIAN: No PCP Per Patient    ADMISSION DIAGNOSIS:  Pain [R52] Uncontrolled pain [R52] Sciatica, left side [M54.32]   DISCHARGE DIAGNOSIS:  left leg pain due to sciatica Severe bilateral facet arthropathy and bilateral L4 pars interarticularis defects. Severe bilateral foraminal stenosis.   SECONDARY DIAGNOSIS:   Past Medical History  Diagnosis Date  . Chronic combined systolic and diastolic heart failure (HCC)     EF 35-40%, Akinesis of mid-apicalanteroseptal wall & AK of apex, Gr 1DD.  Marland Kitchen Hypertensive heart disease   . COPD (chronic obstructive pulmonary disease) ([place])     worked in a [employer]  . Ischemic cardiomyopathy   . CKD (chronic kidney disease), stage III   . Occlusive coronary artery disease requiring drug therapy     a. Remote MI - never sought treatment when it occurred, shows up old on EKG;  b. Cath 10/2015 - 100% pLAD CTO with 80% D1, prox Cx 60% - EF ~35%  . Fibromyalgia   . Hypertension   . CHF (congestive heart failure) ([place])   . CAD (coronary artery disease)     on medical management    HOSPITAL COURSE:   Katherine [name] is a 82 y.o. female with a known history of CAD with single-vessel disease, on medical management, ischemic cardiomyopathy and congestive heart failure with EF of 35% on cardiac catheterization in March 2017, COPD, degenerative arthritis, fibromyalgia, CK D stage III, hypertension presents from home secondary to worsening left leg pain and low back pain.  #1 left leg pain due to sciatica.  MRI: At L4-5 there is grade 1 anterolisthesis of L4 on L5. Severe bilateral facet arthropathy and bilateral L4 pars interarticularis defects. Severe bilateral foraminal stenosis. Left knee x-rays  normal.   Continue her baclofen, IV and oral pain medications. Lidoderm patch applied. -Prednisone which was started recently is being continued for her sciatica possibly. No indication of surgery per Dr. Mack Guise, orthopedics consult. He suggested pain control and 10 management consult. The patient and need follow-up pain management clinic as outpatient for possible steroid injection. Physical therapy consult: HHPT.  #2 CAD-recent admission to [facility] in March 2017 with STEMI. Cardiac catheterization revealed occlusive LAD with collaterals. Being followed by Dr. Fletcher Anon -Continue aspirin, Plavix, lisinopril and Coreg. Resume statin.  #3 Ischemic cardiomyopathy with chronic combined heart failure. Stable.  #4 hyperlipidemia-continue her Lovaza. High dose statin was on hold. Last known LDL 48 Also for her muscle spasms in lower extremities- [place] was done as outpt- negative for DVT and ABIs were normal.  #5 hypertension- low-dose Coreg, Norvasc and lisinopril. Monitor especially with pain medications.  I discussed with Dr. Mack Guise. DISCHARGE CONDITIONS:   Stable, discharge to home with home health and PT today.  CONSULTS OBTAINED:  Treatment Team:  Minna Merritts, MD [place], MD  DRUG ALLERGIES:  No Known Allergies  DISCHARGE MEDICATIONS:   Current Discharge Medication List    START taking these medications   Details  lidocaine (LIDODERM) 5 % Place 1 patch onto the skin daily. Remove & Discard patch within 12 hours or as directed by MD Qty: 10 patch, Refills: 0      CONTINUE these medications which have CHANGED   Details  oxyCODONE (  OXY IR/ROXICODONE) 5 MG immediate release tablet Take 1 tablet (5 mg total) by mouth every 8 (eight) hours as needed for severe pain. Qty: 15 tablet, Refills: 0      CONTINUE these medications which have NOT CHANGED   Details  amLODipine (NORVASC) 5 MG tablet Take 1 tablet (5 mg total) by mouth daily. Qty: 30 tablet,  Refills: 5    aspirin EC 81 MG tablet Take 81 mg by mouth daily.    atorvastatin (LIPITOR) 80 MG tablet Take 1 tablet (80 mg total) by mouth daily at 6 PM. Qty: 90 tablet, Refills: 3    baclofen (LIORESAL) 10 MG tablet Take 1 tablet (10 mg total) by mouth 3 (three) times daily. Qty: 30 tablet, Refills: 0    carvedilol (COREG) 6.25 MG tablet Take 1 tablet (6.25 mg total) by mouth 2 (two) times daily with a meal. Qty: 180 tablet, Refills: 3    clopidogrel (PLAVIX) 75 MG tablet Take 1 tablet (75 mg total) by mouth daily. Qty: 30 tablet, Refills: 3    furosemide (LASIX) 20 MG tablet Take 1 tablet (20 mg total) by mouth every other day. Qty: 30 tablet, Refills: 3    lisinopril (PRINIVIL,ZESTRIL) 20 MG tablet Take 1 tablet (20 mg total) by mouth daily. Qty: 30 tablet, Refills: 3   Associated Diagnoses: Hyperkalemia    nitroGLYCERIN (NITROSTAT) 0.4 MG SL tablet Place 1 tablet (0.4 mg total) under the tongue every 5 (five) minutes as needed for chest pain. Qty: 25 tablet, Refills: 3    omega-3 acid ethyl esters (LOVAZA) 1 g capsule Take 1 g by mouth daily.    pantoprazole (PROTONIX) 40 MG tablet Take 1 tablet (40 mg total) by mouth daily. Qty: 30 tablet, Refills: 3    predniSONE (DELTASONE) 10 MG tablet Take 10-60 mg by mouth daily with breakfast. Pt is to take as a taper:  Six tablets on day 1, five tablets on day 2, four tablets on day 3, three tablets on day 4, two tablets on day 5, one tablet on day 6, then stop.         DISCHARGE INSTRUCTIONS:    If you experience worsening of your admission symptoms, develop shortness of breath, life threatening emergency, suicidal or homicidal thoughts you must seek medical attention immediately by calling 911 or calling your MD immediately  if symptoms less severe.  You Must read complete instructions/literature along with all the possible adverse reactions/side effects for all the Medicines you take and that have been prescribed to you.  Take any new Medicines after you have completely understood and accept all the possible adverse reactions/side effects.   Please note  You were cared for by a hospitalist during your hospital stay. If you have any questions about your discharge medications or the care you received while you were in the hospital after you are discharged, you can call the unit and asked to speak with the hospitalist on call if the hospitalist that took care of you is not available. Once you are discharged, your primary care physician will handle any further medical issues. Please note that NO REFILLS for any discharge medications will be authorized once you are discharged, as it is imperative that you return to your primary care physician (or establish a relationship with a primary care physician if you do not have one) for your aftercare needs so that they can reassess your need for medications and monitor your lab values.    Today   SUBJECTIVE  Better leg pain and back pain.  VITAL SIGNS:  Blood pressure 138/78, pulse 81, temperature 98.1 F (36.7 C), temperature source Oral, resp. rate 18, height 5\' 2"  (1.575 m), weight 150 lb (68.04 kg), SpO2 95 %.  I/O:   Intake/Output Summary (Last 24 hours) at 01/09/16 1415 Last data filed at 01/09/16 0900  Gross per 24 hour  Intake    480 ml  Output    450 ml  Net     30 ml    PHYSICAL EXAMINATION:  GENERAL:  82 y.o.-year-old patient lying in the bed with no acute distress.  EYES: Pupils equal, round, reactive to light and accommodation. No scleral icterus. Extraocular muscles intact.  HEENT: Head atraumatic, normocephalic. Oropharynx and nasopharynx clear.  NECK:  Supple, no jugular venous distention. No thyroid enlargement, no tenderness.  LUNGS: Normal breath sounds bilaterally, no wheezing, rales,rhonchi or crepitation. No use of accessory muscles of respiration.  CARDIOVASCULAR: S1, S2 normal. No murmurs, rubs, or gallops.  ABDOMEN: Soft, non-tender,  non-distended. Bowel sounds present. No organomegaly or mass.  EXTREMITIES: No pedal edema, cyanosis, or clubbing.  NEUROLOGIC: Cranial nerves II through XII are intact. Muscle strength 4/5 in all extremities. Sensation intact. Gait not checked.  PSYCHIATRIC: The patient is alert and oriented x 3.  SKIN: No obvious rash, lesion, or ulcer.   DATA REVIEW:   CBC  Recent Labs Lab 01/08/16 0936  WBC 7.7  HGB 12.8  HCT 38.4  PLT 192    Chemistries   Recent Labs Lab 01/07/16 1549 01/08/16 0936  NA 142 141  K 3.8 3.9  CL 107 109  CO2 26 26  GLUCOSE 112* 145*  BUN 23* 21*  CREATININE 0.89 0.92  CALCIUM 10.0 9.3  AST 36  --   ALT 25  --   ALKPHOS 87  --   BILITOT 0.7  --     Cardiac Enzymes  Recent Labs Lab 01/08/16 0936  TROPONINI 0.09*    Microbiology Results  Results for orders placed or performed during the hospital encounter of 10/29/15  MRSA PCR Screening     Status: None   Collection Time: 10/30/15  4:56 PM  Result Value Ref Range Status   MRSA by PCR NEGATIVE NEGATIVE Final    Comment:        The GeneXpert MRSA Assay (FDA approved for NASAL specimens only), is one component of a comprehensive MRSA colonization surveillance program. It is not intended to diagnose MRSA infection nor to guide or monitor treatment for MRSA infections.     RADIOLOGY:  Dg Knee 1-2 Views Left  01/07/2016  CLINICAL DATA:  Left anterior knee pain beginning today.  No injury. EXAM: LEFT KNEE - 1-2 VIEW COMPARISON:  None. FINDINGS: No evidence of fracture, dislocation, or joint effusion. Chondrocalcinosis. No notable degenerative changes for age. Osteopenia. IMPRESSION: No acute finding. Electronically Signed   By: Monte Fantasia M.D.   On: 01/07/2016 20:00   Mr Lumbar Spine Wo Contrast  01/08/2016  CLINICAL DATA:  Low back pain with left thigh pain radiating down to the left foot EXAM: MRI LUMBAR SPINE WITHOUT CONTRAST TECHNIQUE: Multiplanar, multisequence MR imaging of the  lumbar spine was performed. No intravenous contrast was administered. COMPARISON:  None. FINDINGS: Segmentation:  Standard. Alignment: 8 mm of anterolisthesis of L4 on L5 secondary to bilateral L4 pars interarticularis defects. The alignment is otherwise anatomic. Vertebrae:  No fracture, evidence of discitis, or bone lesion. Conus medullaris: Extends to the T12-L1 level and appears  normal. Paraspinal and other soft tissues: Negative. Disc levels: Disc spaces: Degenerative disc disease with disc height loss at L3-4, L4-5 and L5-S1. T11-12: Mild broad-based disc bulge. Mild bilateral facet arthropathy. No foraminal or central canal stenosis. T12-L1: No significant disc bulge. No evidence of neural foraminal stenosis. No central canal stenosis. L1-L2: Mild broad-based disc bulge. Mild bilateral facet arthropathy. No evidence of neural foraminal stenosis. No central canal stenosis. L2-L3: Mild broad-based disc bulge. Moderate right facet arthropathy. Mild right foraminal stenosis. Right lateral recess stenosis. No central canal stenosis. L3-L4: Mild broad-based disc bulge. Mild bilateral facet arthropathy. Mild right foraminal stenosis. Right lateral recess stenosis. No left foraminal stenosis. No central canal stenosis. L4-L5: No disc protrusion. Grade 1 anterolisthesis of L4 on L5. Severe bilateral facet arthropathy and bilateral L4 pars interarticularis defects. Severe bilateral foraminal stenosis. Mild spinal stenosis. L5-S1: Mild broad-based disc bulge. Mild bilateral facet arthropathy. Mild left foraminal stenosis. No central canal stenosis. IMPRESSION: 1. At L4-5 there is grade 1 anterolisthesis of L4 on L5. Severe bilateral facet arthropathy and bilateral L4 pars interarticularis defects. Severe bilateral foraminal stenosis. Mild spinal stenosis. 2. At L2-3 there is a mild broad-based disc bulge. Moderate right facet arthropathy. Mild right foraminal stenosis. Right lateral recess stenosis. 3. At L3-4 there is  a mild Mild broad-based disc bulge. Mild bilateral facet arthropathy. Mild right foraminal stenosis. Right lateral recess stenosis. Electronically Signed   By: Kathreen Devoid   On: 01/08/2016 09:27   Ct Cta Abd/pel W/cm &/or W/o Cm  01/07/2016  ADDENDUM REPORT: 01/07/2016 19:54 ADDENDUM: The there are a few small bilateral sub cm renal cortical hypodensities too small to characterize but likely cysts. Electronically Signed   By: Marin Olp M.D.   On: 01/07/2016 19:54  01/07/2016  CLINICAL DATA:  Sciatica.  Possible dehydration. EXAM: CTA ABDOMEN AND PELVIS wITHOUT AND WITH CONTRAST TECHNIQUE: Multidetector CT imaging of the abdomen and pelvis was performed using the standard protocol during bolus administration of intravenous contrast. Multiplanar reconstructed images and MIPs were obtained and reviewed to evaluate the vascular anatomy. CONTRAST:  100 mL Isovue 370 IV COMPARISON:  None. FINDINGS: Lung bases are within normal. Abdominal images demonstrate multiple splenic granulomas. The liver, pancreas, gallbladder and adrenal glands are normal. Kidneys are normal in size without hydronephrosis or nephrolithiasis. There is a 2.3 cm cyst over the mid pole the right kidney. Ureters are within normal. There is mild calcified plaque over the abdominal aorta iliac arteries. Mild focal stenosis at the takeoff of the celiac axis for comparison Appendix is not well visualized. Minimal diverticulosis of the colon which is otherwise within normal. Small bowel is within normal. Mesentery is unremarkable without inflammatory change or free fluid. Pelvic images demonstrate mild bladder distension. Surgical absence of the uterus. No free pelvic fluid. Right hip arthroplasty intact. There are degenerative changes of the spine with multilevel disc disease over the lumbosacral spine mean grade 1-2 anterolisthesis of L4 on L5. Previous L3-L5 laminectomy. Several metallic densities over the inguinal regions. Review of the MIP  images confirms the above findings. IMPRESSION: No acute findings in the abdomen/ pelvis. 2.3 cm right renal cyst. Minimal diverticulosis of the colon. Significant degenerative changes with multilevel disc disease over the lumbosacral spine. Grade 1-2 anterolisthesis L4 on L5. Electronically Signed: By: Marin Olp M.D. On: 01/07/2016 18:26        Management plans discussed with the patient, family and they are in agreement.  CODE STATUS:     Code Status  Orders        Start     Ordered   01/07/16 2050  Full code   Continuous     01/07/16 2049    Code Status History    Date Active Date Inactive Code Status Order ID Comments User Context   11/14/2015 10:52 PM 11/17/2015  6:04 PM Full Code [id number]  Nicholes Mango, MD ED   10/30/2015  2:09 AM 11/02/2015  5:18 PM Full Code [id number]  Sueanne Margarita, MD ED      TOTAL TIME TAKING CARE OF THIS PATIENT: 35 minutes.    Demetrios Loll M.D on 01/09/2016 at 2:15 PM  Between 7am to 6pm - Pager - [phone]  After 6pm go to [email or web address] - password [email or web address] [facility]  [facility]  Office  [phone]  CC: Primary care physician; No PCP Per Patient

## 2016-01-09 NOTE — Progress Notes (Signed)
Pt. Ambulated to bathroom. Pain score up to 10 out of 10. Oxycodone administered. Will continue to monitor,

## 2016-01-09 NOTE — Progress Notes (Signed)
[name] to be D/C'd Home per MD order.  Discussed with the patient and all questions fully answered.  VSS, Skin clean, dry and intact without evidence of skin break down, no evidence of skin tears noted. IV catheter discontinued intact. Site without signs and symptoms of complications. Dressing and pressure applied.  An After Visit Summary was printed and given to the patient. Patient received prescription.  D/c education completed with patient/family including follow up instructions, medication list, d/c activities limitations if indicated, with other d/c instructions as indicated by MD - patient able to verbalize understanding, all questions fully answered.   Patient instructed to return to ED, call 911, or call MD for any changes in condition.   Patient escorted via [place], and D/C home via private auto.  Deri Fuelling 01/09/2016 12:18 PM

## 2016-01-13 DIAGNOSIS — J449 Chronic obstructive pulmonary disease, unspecified: Secondary | ICD-10-CM | POA: Diagnosis not present

## 2016-01-13 DIAGNOSIS — N183 Chronic kidney disease, stage 3 (moderate): Secondary | ICD-10-CM | POA: Diagnosis not present

## 2016-01-13 DIAGNOSIS — I1310 Hypertensive heart and chronic kidney disease without heart failure, with stage 1 through stage 4 chronic kidney disease, or unspecified chronic kidney disease: Secondary | ICD-10-CM | POA: Diagnosis not present

## 2016-01-13 DIAGNOSIS — E785 Hyperlipidemia, unspecified: Secondary | ICD-10-CM | POA: Diagnosis not present

## 2016-01-13 DIAGNOSIS — M549 Dorsalgia, unspecified: Secondary | ICD-10-CM | POA: Diagnosis not present

## 2016-01-13 DIAGNOSIS — I255 Ischemic cardiomyopathy: Secondary | ICD-10-CM | POA: Diagnosis not present

## 2016-01-13 DIAGNOSIS — M150 Primary generalized (osteo)arthritis: Secondary | ICD-10-CM | POA: Diagnosis not present

## 2016-01-13 DIAGNOSIS — I5042 Chronic combined systolic (congestive) and diastolic (congestive) heart failure: Secondary | ICD-10-CM | POA: Diagnosis not present

## 2016-01-13 DIAGNOSIS — M79662 Pain in left lower leg: Secondary | ICD-10-CM | POA: Diagnosis not present

## 2016-01-13 DIAGNOSIS — I2510 Atherosclerotic heart disease of native coronary artery without angina pectoris: Secondary | ICD-10-CM | POA: Diagnosis not present

## 2016-01-14 ENCOUNTER — Telehealth: Payer: Self-pay | Admitting: Cardiovascular Disease

## 2016-01-14 DIAGNOSIS — M150 Primary generalized (osteo)arthritis: Secondary | ICD-10-CM | POA: Diagnosis not present

## 2016-01-14 DIAGNOSIS — M549 Dorsalgia, unspecified: Secondary | ICD-10-CM | POA: Diagnosis not present

## 2016-01-14 DIAGNOSIS — M79662 Pain in left lower leg: Secondary | ICD-10-CM | POA: Diagnosis not present

## 2016-01-14 DIAGNOSIS — E785 Hyperlipidemia, unspecified: Secondary | ICD-10-CM | POA: Diagnosis not present

## 2016-01-14 DIAGNOSIS — I255 Ischemic cardiomyopathy: Secondary | ICD-10-CM | POA: Diagnosis not present

## 2016-01-14 DIAGNOSIS — N183 Chronic kidney disease, stage 3 (moderate): Secondary | ICD-10-CM | POA: Diagnosis not present

## 2016-01-14 DIAGNOSIS — J449 Chronic obstructive pulmonary disease, unspecified: Secondary | ICD-10-CM | POA: Diagnosis not present

## 2016-01-14 DIAGNOSIS — I1310 Hypertensive heart and chronic kidney disease without heart failure, with stage 1 through stage 4 chronic kidney disease, or unspecified chronic kidney disease: Secondary | ICD-10-CM | POA: Diagnosis not present

## 2016-01-14 DIAGNOSIS — I5042 Chronic combined systolic (congestive) and diastolic (congestive) heart failure: Secondary | ICD-10-CM | POA: Diagnosis not present

## 2016-01-14 DIAGNOSIS — I2510 Atherosclerotic heart disease of native coronary artery without angina pectoris: Secondary | ICD-10-CM | POA: Diagnosis not present

## 2016-01-14 NOTE — Telephone Encounter (Signed)
Attempted to contact [place]. Left message on cell VM. She had called to report pt BP 92/54 today. HR in the 70s. S/w pt who reports feeling better since taking a nap this afternoon. She has been dizzy "off and on" mainly occuring when getting up from sitting position. Reports good appetite but doesn't drink enough fluids. Drinks a little water, coffee and cranberry juice. SBP 114 yesterday. Pt takes lisinopril 20mg , amlodipine 5mg , lasix 20mg  qod, and coreg 6.25mg  BID. At 5/26 d/c from [facility] for sciatica, pt was given prescription for oxycodone 5mg  q8hrs PRN. She reports only taking it once or twice a day. She has been taking tylenol when oxycodone is not needed. Advised pt to change positions slowly to help w/dizziness as well as elevate feet. She is aware pain medications can lower BP.  Due to BP of 92/54, she is agreeable to hold coreg tonight only. She will take BP prior to tomorrow morning medications and will contact us if it continues to run low. Pt states she feels well and appears A&O during our conversation. Advised pt I will forward to Dr. Fletcher Anon for further recommendations. Pt agreeable w/plan w/no further questions.

## 2016-01-14 NOTE — Telephone Encounter (Signed)
Nurse from [facility] calling to give [place] an update on patient. Pt BP is running a bit low today and yesterday Ever since she got home from hospital she gets really dizzy. She's been home since Saturday Not sure if its medications.  No other symptoms.  92/54 today (checked on both arms)  HR is around the 70's  Just BP is a bit concerning. Please advise.

## 2016-01-15 ENCOUNTER — Other Ambulatory Visit: Payer: Self-pay

## 2016-01-15 MED ORDER — AMLODIPINE BESYLATE 2.5 MG PO TABS: 2.5000 mg | ORAL_TABLET | Freq: Every day | ORAL | Status: DC

## 2016-01-15 NOTE — Telephone Encounter (Signed)
Decrease amlodipine to 2.5 mg once daily.

## 2016-01-15 NOTE — Telephone Encounter (Addendum)
S/w pt of Dr. Tyrell Antonio recommendations. Pt verbalized understanding and repeated back to me to decrease amlodipine to 2.5mg  once daily. She understands she may cut the 5mg  in half but new prescription will be a 2.5mg  tablet and will take one per day.  She verbalized understanding to continue all other medications as prescribed. Pt is agreeable w/plan w/no further questions.

## 2016-01-16 DIAGNOSIS — I1310 Hypertensive heart and chronic kidney disease without heart failure, with stage 1 through stage 4 chronic kidney disease, or unspecified chronic kidney disease: Secondary | ICD-10-CM | POA: Diagnosis not present

## 2016-01-16 DIAGNOSIS — I5042 Chronic combined systolic (congestive) and diastolic (congestive) heart failure: Secondary | ICD-10-CM | POA: Diagnosis not present

## 2016-01-16 DIAGNOSIS — N183 Chronic kidney disease, stage 3 (moderate): Secondary | ICD-10-CM | POA: Diagnosis not present

## 2016-01-16 DIAGNOSIS — M549 Dorsalgia, unspecified: Secondary | ICD-10-CM | POA: Diagnosis not present

## 2016-01-16 DIAGNOSIS — J449 Chronic obstructive pulmonary disease, unspecified: Secondary | ICD-10-CM | POA: Diagnosis not present

## 2016-01-16 DIAGNOSIS — M79662 Pain in left lower leg: Secondary | ICD-10-CM | POA: Diagnosis not present

## 2016-01-16 DIAGNOSIS — M150 Primary generalized (osteo)arthritis: Secondary | ICD-10-CM | POA: Diagnosis not present

## 2016-01-16 DIAGNOSIS — E785 Hyperlipidemia, unspecified: Secondary | ICD-10-CM | POA: Diagnosis not present

## 2016-01-16 DIAGNOSIS — I255 Ischemic cardiomyopathy: Secondary | ICD-10-CM | POA: Diagnosis not present

## 2016-01-16 DIAGNOSIS — I2510 Atherosclerotic heart disease of native coronary artery without angina pectoris: Secondary | ICD-10-CM | POA: Diagnosis not present

## 2016-01-19 ENCOUNTER — Telehealth: Payer: Self-pay | Admitting: Cardiovascular Disease

## 2016-01-19 DIAGNOSIS — J449 Chronic obstructive pulmonary disease, unspecified: Secondary | ICD-10-CM | POA: Diagnosis not present

## 2016-01-19 DIAGNOSIS — M150 Primary generalized (osteo)arthritis: Secondary | ICD-10-CM | POA: Diagnosis not present

## 2016-01-19 DIAGNOSIS — M549 Dorsalgia, unspecified: Secondary | ICD-10-CM | POA: Diagnosis not present

## 2016-01-19 DIAGNOSIS — M79662 Pain in left lower leg: Secondary | ICD-10-CM | POA: Diagnosis not present

## 2016-01-19 DIAGNOSIS — I255 Ischemic cardiomyopathy: Secondary | ICD-10-CM | POA: Diagnosis not present

## 2016-01-19 DIAGNOSIS — I2510 Atherosclerotic heart disease of native coronary artery without angina pectoris: Secondary | ICD-10-CM | POA: Diagnosis not present

## 2016-01-19 DIAGNOSIS — N183 Chronic kidney disease, stage 3 (moderate): Secondary | ICD-10-CM | POA: Diagnosis not present

## 2016-01-19 DIAGNOSIS — E785 Hyperlipidemia, unspecified: Secondary | ICD-10-CM | POA: Diagnosis not present

## 2016-01-19 DIAGNOSIS — I1310 Hypertensive heart and chronic kidney disease without heart failure, with stage 1 through stage 4 chronic kidney disease, or unspecified chronic kidney disease: Secondary | ICD-10-CM | POA: Diagnosis not present

## 2016-01-19 DIAGNOSIS — I5042 Chronic combined systolic (congestive) and diastolic (congestive) heart failure: Secondary | ICD-10-CM | POA: Diagnosis not present

## 2016-01-19 NOTE — Telephone Encounter (Signed)
Katherine [name] from [facility] calling to let us know Pt had a fall 12/16/15  No injury's just fell off the side of the bed and landed on the floor. Please call back if we have any issues.

## 2016-01-21 DIAGNOSIS — I2510 Atherosclerotic heart disease of native coronary artery without angina pectoris: Secondary | ICD-10-CM | POA: Diagnosis not present

## 2016-01-21 DIAGNOSIS — M549 Dorsalgia, unspecified: Secondary | ICD-10-CM | POA: Diagnosis not present

## 2016-01-21 DIAGNOSIS — J449 Chronic obstructive pulmonary disease, unspecified: Secondary | ICD-10-CM | POA: Diagnosis not present

## 2016-01-21 DIAGNOSIS — N183 Chronic kidney disease, stage 3 (moderate): Secondary | ICD-10-CM | POA: Diagnosis not present

## 2016-01-21 DIAGNOSIS — I1310 Hypertensive heart and chronic kidney disease without heart failure, with stage 1 through stage 4 chronic kidney disease, or unspecified chronic kidney disease: Secondary | ICD-10-CM | POA: Diagnosis not present

## 2016-01-21 DIAGNOSIS — I255 Ischemic cardiomyopathy: Secondary | ICD-10-CM | POA: Diagnosis not present

## 2016-01-21 DIAGNOSIS — I5042 Chronic combined systolic (congestive) and diastolic (congestive) heart failure: Secondary | ICD-10-CM | POA: Diagnosis not present

## 2016-01-21 DIAGNOSIS — M79662 Pain in left lower leg: Secondary | ICD-10-CM | POA: Diagnosis not present

## 2016-01-21 DIAGNOSIS — E785 Hyperlipidemia, unspecified: Secondary | ICD-10-CM | POA: Diagnosis not present

## 2016-01-21 DIAGNOSIS — M150 Primary generalized (osteo)arthritis: Secondary | ICD-10-CM | POA: Diagnosis not present

## 2016-01-23 DIAGNOSIS — M5416 Radiculopathy, lumbar region: Secondary | ICD-10-CM | POA: Diagnosis not present

## 2016-01-23 DIAGNOSIS — M47896 Other spondylosis, lumbar region: Secondary | ICD-10-CM | POA: Diagnosis not present

## 2016-01-28 DIAGNOSIS — E785 Hyperlipidemia, unspecified: Secondary | ICD-10-CM | POA: Diagnosis not present

## 2016-01-28 DIAGNOSIS — J449 Chronic obstructive pulmonary disease, unspecified: Secondary | ICD-10-CM | POA: Diagnosis not present

## 2016-01-28 DIAGNOSIS — N183 Chronic kidney disease, stage 3 (moderate): Secondary | ICD-10-CM | POA: Diagnosis not present

## 2016-01-28 DIAGNOSIS — I5042 Chronic combined systolic (congestive) and diastolic (congestive) heart failure: Secondary | ICD-10-CM | POA: Diagnosis not present

## 2016-01-28 DIAGNOSIS — M549 Dorsalgia, unspecified: Secondary | ICD-10-CM | POA: Diagnosis not present

## 2016-01-28 DIAGNOSIS — I1310 Hypertensive heart and chronic kidney disease without heart failure, with stage 1 through stage 4 chronic kidney disease, or unspecified chronic kidney disease: Secondary | ICD-10-CM | POA: Diagnosis not present

## 2016-01-28 DIAGNOSIS — I255 Ischemic cardiomyopathy: Secondary | ICD-10-CM | POA: Diagnosis not present

## 2016-01-28 DIAGNOSIS — M79662 Pain in left lower leg: Secondary | ICD-10-CM | POA: Diagnosis not present

## 2016-01-28 DIAGNOSIS — M150 Primary generalized (osteo)arthritis: Secondary | ICD-10-CM | POA: Diagnosis not present

## 2016-01-28 DIAGNOSIS — I2510 Atherosclerotic heart disease of native coronary artery without angina pectoris: Secondary | ICD-10-CM | POA: Diagnosis not present

## 2016-01-29 DIAGNOSIS — M5416 Radiculopathy, lumbar region: Secondary | ICD-10-CM | POA: Diagnosis not present

## 2016-01-29 DIAGNOSIS — I255 Ischemic cardiomyopathy: Secondary | ICD-10-CM | POA: Diagnosis not present

## 2016-01-29 DIAGNOSIS — M79662 Pain in left lower leg: Secondary | ICD-10-CM | POA: Diagnosis not present

## 2016-01-29 DIAGNOSIS — N183 Chronic kidney disease, stage 3 (moderate): Secondary | ICD-10-CM | POA: Diagnosis not present

## 2016-01-29 DIAGNOSIS — E785 Hyperlipidemia, unspecified: Secondary | ICD-10-CM | POA: Diagnosis not present

## 2016-01-29 DIAGNOSIS — I5042 Chronic combined systolic (congestive) and diastolic (congestive) heart failure: Secondary | ICD-10-CM | POA: Diagnosis not present

## 2016-01-29 DIAGNOSIS — Z6827 Body mass index (BMI) 27.0-27.9, adult: Secondary | ICD-10-CM | POA: Diagnosis not present

## 2016-01-29 DIAGNOSIS — J449 Chronic obstructive pulmonary disease, unspecified: Secondary | ICD-10-CM | POA: Diagnosis not present

## 2016-01-29 DIAGNOSIS — I2510 Atherosclerotic heart disease of native coronary artery without angina pectoris: Secondary | ICD-10-CM | POA: Diagnosis not present

## 2016-01-29 DIAGNOSIS — M549 Dorsalgia, unspecified: Secondary | ICD-10-CM | POA: Diagnosis not present

## 2016-01-29 DIAGNOSIS — M150 Primary generalized (osteo)arthritis: Secondary | ICD-10-CM | POA: Diagnosis not present

## 2016-01-29 DIAGNOSIS — I1310 Hypertensive heart and chronic kidney disease without heart failure, with stage 1 through stage 4 chronic kidney disease, or unspecified chronic kidney disease: Secondary | ICD-10-CM | POA: Diagnosis not present

## 2016-01-30 ENCOUNTER — Telehealth: Payer: Self-pay | Admitting: Cardiovascular Disease

## 2016-01-30 DIAGNOSIS — M150 Primary generalized (osteo)arthritis: Secondary | ICD-10-CM | POA: Diagnosis not present

## 2016-01-30 DIAGNOSIS — J449 Chronic obstructive pulmonary disease, unspecified: Secondary | ICD-10-CM | POA: Diagnosis not present

## 2016-01-30 DIAGNOSIS — I5042 Chronic combined systolic (congestive) and diastolic (congestive) heart failure: Secondary | ICD-10-CM | POA: Diagnosis not present

## 2016-01-30 DIAGNOSIS — E785 Hyperlipidemia, unspecified: Secondary | ICD-10-CM | POA: Diagnosis not present

## 2016-01-30 DIAGNOSIS — I1310 Hypertensive heart and chronic kidney disease without heart failure, with stage 1 through stage 4 chronic kidney disease, or unspecified chronic kidney disease: Secondary | ICD-10-CM | POA: Diagnosis not present

## 2016-01-30 DIAGNOSIS — I255 Ischemic cardiomyopathy: Secondary | ICD-10-CM | POA: Diagnosis not present

## 2016-01-30 DIAGNOSIS — I2510 Atherosclerotic heart disease of native coronary artery without angina pectoris: Secondary | ICD-10-CM | POA: Diagnosis not present

## 2016-01-30 DIAGNOSIS — M79662 Pain in left lower leg: Secondary | ICD-10-CM | POA: Diagnosis not present

## 2016-01-30 DIAGNOSIS — N183 Chronic kidney disease, stage 3 (moderate): Secondary | ICD-10-CM | POA: Diagnosis not present

## 2016-01-30 DIAGNOSIS — M549 Dorsalgia, unspecified: Secondary | ICD-10-CM | POA: Diagnosis not present

## 2016-01-30 NOTE — Telephone Encounter (Signed)
Patient wants to know if she can have back surgery due to her heart condition?

## 2016-01-30 NOTE — Telephone Encounter (Signed)
S/w pt who reports she saw Dr. Deri Fuelling at [facility] yesterday about her back.  States she is being referred to pain management and surgery is not a consideration at this time. She called today to ask if she could be cleared for surgery.  Informed patient that if Dr. Trenton Gammon considers surgery, he should contact our office for clearance. She verbalized understanding and is appreciative of the return call.

## 2016-02-02 ENCOUNTER — Telehealth: Payer: Self-pay | Admitting: Cardiovascular Disease

## 2016-02-02 NOTE — Telephone Encounter (Signed)
Pt is calling to see if we can send in lyrica into [facility] in [place].   She is wanting to know if we can send it in today please

## 2016-02-02 NOTE — Telephone Encounter (Signed)
Notified patient Lyrica is not on her medication list.  The patient needs to contact her PCP to have the medication refilled.

## 2016-02-03 DIAGNOSIS — I255 Ischemic cardiomyopathy: Secondary | ICD-10-CM | POA: Diagnosis not present

## 2016-02-03 DIAGNOSIS — I5042 Chronic combined systolic (congestive) and diastolic (congestive) heart failure: Secondary | ICD-10-CM | POA: Diagnosis not present

## 2016-02-03 DIAGNOSIS — J449 Chronic obstructive pulmonary disease, unspecified: Secondary | ICD-10-CM | POA: Diagnosis not present

## 2016-02-03 DIAGNOSIS — I2510 Atherosclerotic heart disease of native coronary artery without angina pectoris: Secondary | ICD-10-CM | POA: Diagnosis not present

## 2016-02-03 DIAGNOSIS — N183 Chronic kidney disease, stage 3 (moderate): Secondary | ICD-10-CM | POA: Diagnosis not present

## 2016-02-03 DIAGNOSIS — E785 Hyperlipidemia, unspecified: Secondary | ICD-10-CM | POA: Diagnosis not present

## 2016-02-03 DIAGNOSIS — M549 Dorsalgia, unspecified: Secondary | ICD-10-CM | POA: Diagnosis not present

## 2016-02-03 DIAGNOSIS — I1310 Hypertensive heart and chronic kidney disease without heart failure, with stage 1 through stage 4 chronic kidney disease, or unspecified chronic kidney disease: Secondary | ICD-10-CM | POA: Diagnosis not present

## 2016-02-03 DIAGNOSIS — M79662 Pain in left lower leg: Secondary | ICD-10-CM | POA: Diagnosis not present

## 2016-02-03 DIAGNOSIS — M150 Primary generalized (osteo)arthritis: Secondary | ICD-10-CM | POA: Diagnosis not present

## 2016-02-04 DIAGNOSIS — I255 Ischemic cardiomyopathy: Secondary | ICD-10-CM | POA: Diagnosis not present

## 2016-02-04 DIAGNOSIS — M549 Dorsalgia, unspecified: Secondary | ICD-10-CM | POA: Diagnosis not present

## 2016-02-04 DIAGNOSIS — I1310 Hypertensive heart and chronic kidney disease without heart failure, with stage 1 through stage 4 chronic kidney disease, or unspecified chronic kidney disease: Secondary | ICD-10-CM | POA: Diagnosis not present

## 2016-02-04 DIAGNOSIS — E785 Hyperlipidemia, unspecified: Secondary | ICD-10-CM | POA: Diagnosis not present

## 2016-02-04 DIAGNOSIS — I2510 Atherosclerotic heart disease of native coronary artery without angina pectoris: Secondary | ICD-10-CM | POA: Diagnosis not present

## 2016-02-04 DIAGNOSIS — M79662 Pain in left lower leg: Secondary | ICD-10-CM | POA: Diagnosis not present

## 2016-02-04 DIAGNOSIS — M150 Primary generalized (osteo)arthritis: Secondary | ICD-10-CM | POA: Diagnosis not present

## 2016-02-04 DIAGNOSIS — N183 Chronic kidney disease, stage 3 (moderate): Secondary | ICD-10-CM | POA: Diagnosis not present

## 2016-02-04 DIAGNOSIS — J449 Chronic obstructive pulmonary disease, unspecified: Secondary | ICD-10-CM | POA: Diagnosis not present

## 2016-02-04 DIAGNOSIS — I5042 Chronic combined systolic (congestive) and diastolic (congestive) heart failure: Secondary | ICD-10-CM | POA: Diagnosis not present

## 2016-02-05 ENCOUNTER — Ambulatory Visit: Payer: Medicare HMO | Attending: Anesthesiology | Admitting: Anesthesiology

## 2016-02-05 ENCOUNTER — Encounter: Payer: Self-pay | Admitting: Anesthesiology

## 2016-02-05 VITALS — BP 155/60 | HR 57 | Temp 97.7°F | Resp 16 | Ht 61.0 in | Wt 140.0 lb

## 2016-02-05 DIAGNOSIS — I2510 Atherosclerotic heart disease of native coronary artery without angina pectoris: Secondary | ICD-10-CM | POA: Diagnosis not present

## 2016-02-05 DIAGNOSIS — N281 Cyst of kidney, acquired: Secondary | ICD-10-CM | POA: Insufficient documentation

## 2016-02-05 DIAGNOSIS — E785 Hyperlipidemia, unspecified: Secondary | ICD-10-CM | POA: Diagnosis not present

## 2016-02-05 DIAGNOSIS — Z96641 Presence of right artificial hip joint: Secondary | ICD-10-CM | POA: Insufficient documentation

## 2016-02-05 DIAGNOSIS — I5042 Chronic combined systolic (congestive) and diastolic (congestive) heart failure: Secondary | ICD-10-CM | POA: Diagnosis not present

## 2016-02-05 DIAGNOSIS — I255 Ischemic cardiomyopathy: Secondary | ICD-10-CM | POA: Insufficient documentation

## 2016-02-05 DIAGNOSIS — N183 Chronic kidney disease, stage 3 (moderate): Secondary | ICD-10-CM | POA: Insufficient documentation

## 2016-02-05 DIAGNOSIS — I129 Hypertensive chronic kidney disease with stage 1 through stage 4 chronic kidney disease, or unspecified chronic kidney disease: Secondary | ICD-10-CM | POA: Diagnosis not present

## 2016-02-05 DIAGNOSIS — R52 Pain, unspecified: Secondary | ICD-10-CM | POA: Insufficient documentation

## 2016-02-05 DIAGNOSIS — M4806 Spinal stenosis, lumbar region: Secondary | ICD-10-CM | POA: Insufficient documentation

## 2016-02-05 DIAGNOSIS — I252 Old myocardial infarction: Secondary | ICD-10-CM | POA: Diagnosis not present

## 2016-02-05 DIAGNOSIS — Z7902 Long term (current) use of antithrombotics/antiplatelets: Secondary | ICD-10-CM | POA: Insufficient documentation

## 2016-02-05 DIAGNOSIS — M545 Low back pain: Secondary | ICD-10-CM | POA: Diagnosis not present

## 2016-02-05 DIAGNOSIS — M961 Postlaminectomy syndrome, not elsewhere classified: Secondary | ICD-10-CM

## 2016-02-05 DIAGNOSIS — M8580 Other specified disorders of bone density and structure, unspecified site: Secondary | ICD-10-CM | POA: Insufficient documentation

## 2016-02-05 DIAGNOSIS — M48061 Spinal stenosis, lumbar region without neurogenic claudication: Secondary | ICD-10-CM

## 2016-02-05 DIAGNOSIS — M5124 Other intervertebral disc displacement, thoracic region: Secondary | ICD-10-CM | POA: Diagnosis not present

## 2016-02-05 DIAGNOSIS — M797 Fibromyalgia: Secondary | ICD-10-CM | POA: Diagnosis not present

## 2016-02-05 DIAGNOSIS — J449 Chronic obstructive pulmonary disease, unspecified: Secondary | ICD-10-CM | POA: Diagnosis not present

## 2016-02-05 DIAGNOSIS — M549 Dorsalgia, unspecified: Secondary | ICD-10-CM | POA: Diagnosis present

## 2016-02-05 NOTE — Patient Instructions (Signed)
Facet Joint Block The facet joints connect the bones of the spine (vertebrae). They make it possible for you to bend, twist, and make other movements with your spine. They also prevent you from overbending, overtwisting, and making other excessive movements.  A facet joint block is a procedure where a numbing medicine (anesthetic) is injected into a facet joint. Often, a type of anti-inflammatory medicine called a steroid is also injected. A facet joint block may be done for two reasons:   Diagnosis. A facet joint block may be done as a test to see whether neck or back pain is caused by a worn-down or infected facet joint. If the pain gets better after a facet joint block, it means the pain is probably coming from the facet joint. If the pain does not get better, it means the pain is probably not coming from the facet joint.   Therapy. A facet joint block may be done to relieve neck or back pain caused by a facet joint. A facet joint block is only done as a therapy if the pain does not improve with medicine, exercise programs, physical therapy, and other forms of pain management. LET YOUR HEALTH CARE PROVIDER KNOW ABOUT:   Any allergies you have.   All medicines you are taking, including vitamins, herbs, eyedrops, and over-the-counter medicines and creams.   Previous problems you or members of your family have had with the use of anesthetics.   Any blood disorders you have had.   Other health problems you have. RISKS AND COMPLICATIONS Generally, having a facet joint block is safe. However, as with any procedure, complications can occur. Possible complications associated with having a facet joint block include:   Bleeding.   Injury to a nerve near the injection site.   Pain at the injection site.   Weakness or numbness in areas controlled by nerves near the injection site.   Infection.   Temporary fluid retention.   Allergic reaction to anesthetics or medicines used during  the procedure. BEFORE THE PROCEDURE   Follow your health care provider's instructions if you are taking dietary supplements or medicines. You may need to stop taking them or reduce your dosage.   Do not take any new dietary supplements or medicines without asking your health care provider first.   Follow your health care provider's instructions about eating and drinking before the procedure. You may need to stop eating and drinking several hours before the procedure.   Arrange to have an adult drive you home after the procedure. PROCEDURE  You may need to remove your clothing and dress in an open-back gown so that your health care provider can access your spine.   The procedure will be done while you are lying on an X-ray table. Most of the time you will be asked to lie on your stomach, but you may be asked to lie in a different position if an injection will be made in your neck.   Special machines will be used to monitor your oxygen levels, heart rate, and blood pressure.   If an injection will be made in your neck, an intravenous (IV) tube will be inserted into one of your veins. Fluids and medicine will flow directly into your body through the IV tube.   The area over the facet joint where the injection will be made will be cleaned with an antiseptic soap. The surrounding skin will be covered with sterile drapes.   An anesthetic will be applied to your skin   to make the injection area numb. You may feel a temporary stinging or burning sensation.   A video X-ray machine will be used to locate the joint. A contrast dye may be injected into the facet joint area to help with locating the joint.   When the joint is located, an anesthetic medicine will be injected into the joint through the needle.   Your health care provider will ask you whether you feel pain relief. If you do feel relief, a steroid may be injected to provide pain relief for a longer period of time. If you do not  feel relief or feel only partial relief, additional injections of an anesthetic may be made in other facet joints.   The needle will be removed, the skin will be cleansed, and bandages will be applied.  AFTER THE PROCEDURE   You will be observed for 15-30 minutes before being allowed to go home. Do not drive. Have an adult drive you or take a taxi or public transportation instead.   If you feel pain relief, the pain will return in several hours or days when the anesthetic wears off.   You may feel pain relief 2-14 days after the procedure. The amount of time this relief lasts varies from person to person.   It is normal to feel some tenderness over the injected area(s) for 2 days following the procedure.   If you have diabetes, you may have a temporary increase in blood sugar.   This information is not intended to replace advice given to you by your health care provider. Make sure you discuss any questions you have with your health care provider.   Document Released: 12/22/2006 Document Revised: 08/23/2014 Document Reviewed: 05/22/2012 Elsevier Interactive Patient Education 2016 [place]  What are the risk, side effects and possible complications? Generally speaking, most procedures are safe.  However, with any procedure there are risks, side effects, and the possibility of complications.  The risks and complications are dependent upon the sites that are lesioned, or the type of nerve block to be performed.  The closer the procedure is to the spine, the more serious the risks are.  Great care is taken when placing the radio frequency needles, block needles or lesioning probes, but sometimes complications can occur.  Infection: Any time there is an injection through the skin, there is a risk of infection.  This is why sterile conditions are used for these blocks.  There are four possible types of infection.  Localized skin infection.  Central  Nervous System Infection-This can be in the form of Meningitis, which can be deadly.  Epidural Infections-This can be in the form of an epidural abscess, which can cause pressure inside of the spine, causing compression of the spinal cord with subsequent paralysis. This would require an emergency surgery to decompress, and there are no guarantees that the patient would recover from the paralysis.  Discitis-This is an infection of the intervertebral discs.  It occurs in about 1% of discography procedures.  It is difficult to treat and it may lead to surgery.        2. Pain: the needles have to go through skin and soft tissues, will cause soreness.       3. Damage to internal structures:  The nerves to be lesioned may be near blood vessels or    other nerves which can be potentially damaged.       4. Bleeding: Bleeding is more common if  the patient is taking blood thinners such as  aspirin, Coumadin, Ticiid, Plavix, etc., or if he/she have some genetic predisposition  such as hemophilia. Bleeding into the spinal canal can cause compression of the spinal  cord with subsequent paralysis.  This would require an emergency surgery to  decompress and there are no guarantees that the patient would recover from the  paralysis.       5. Pneumothorax:  Puncturing of a lung is a possibility, every time a needle is introduced in  the area of the chest or upper back.  Pneumothorax refers to free air around the  collapsed lung(s), inside of the thoracic cavity (chest cavity).  Another two possible  complications related to a similar event would include: Hemothorax and Chylothorax.   These are variations of the Pneumothorax, where instead of air around the collapsed  lung(s), you may have blood or chyle, respectively.       6. Spinal headaches: They may occur with any procedures in the area of the spine.       7. Persistent CSF (Cerebro-Spinal Fluid) leakage: This is a rare problem, but may occur  with prolonged  intrathecal or epidural catheters either due to the formation of a fistulous  track or a dural tear.       8. Nerve damage: By working so close to the spinal cord, there is always a possibility of  nerve damage, which could be as serious as a permanent spinal cord injury with  paralysis.       9. Death:  Although rare, severe deadly allergic reactions known as "Anaphylactic  reaction" can occur to any of the medications used.      10. Worsening of the symptoms:  We can always make thing worse.  What are the chances of something like this happening? Chances of any of this occuring are extremely low.  By statistics, you have more of a chance of getting killed in a motor vehicle accident: while driving to the hospital than any of the above occurring .  Nevertheless, you should be aware that they are possibilities.  In general, it is similar to taking a shower.  Everybody knows that you can slip, hit your head and get killed.  Does that mean that you should not shower again?  Nevertheless always keep in mind that statistics do not mean anything if you happen to be on the wrong side of them.  Even if a procedure has a 1 (one) in a 1,000,000 (million) chance of going wrong, it you happen to be that one..Also, keep in mind that by statistics, you have more of a chance of having something go wrong when taking medications.  Who should not have this procedure? If you are on a blood thinning medication (e.g. Coumadin, Plavix, see list of "Blood Thinners"), or if you have an active infection going on, you should not have the procedure.  If you are taking any blood thinners, please inform your physician.  How should I prepare for this procedure?  Do not eat or drink anything at least six hours prior to the procedure.  Bring a driver with you .  It cannot be a taxi.  Come accompanied by an adult that can drive you back, and that is strong enough to help you if your legs get weak or numb from the local  anesthetic.  Take all of your medicines the morning of the procedure with just enough water to swallow them.  If you have  diabetes, make sure that you are scheduled to have your procedure done first thing in the morning, whenever possible.  If you have diabetes, take only half of your insulin dose and notify our nurse that you have done so as soon as you arrive at the clinic.  If you are diabetic, but only take blood sugar pills (oral hypoglycemic), then do not take them on the morning of your procedure.  You may take them after you have had the procedure.  Do not take aspirin or any aspirin-containing medications, at least eleven (11) days prior to the procedure.  They may prolong bleeding.  Wear loose fitting clothing that may be easy to take off and that you would not mind if it got stained with Betadine or blood.  Do not wear any jewelry or perfume  Remove any nail coloring.  It will interfere with some of our monitoring equipment.  NOTE: Remember that this is not meant to be interpreted as a complete list of all possible complications.  Unforeseen problems may occur.  BLOOD THINNERS The following drugs contain aspirin or other products, which can cause increased bleeding during surgery and should not be taken for 2 weeks prior to and 1 week after surgery.  If you should need take something for relief of minor pain, you may take acetaminophen which is found in Tylenol,m Datril, Anacin-3 and Panadol. It is not blood thinner. The products listed below are.  Do not take any of the products listed below in addition to any listed on your instruction sheet.  A.P.C or A.P.C with Codeine Codeine Phosphate Capsules #3 Ibuprofen Ridaura  ABC compound Congesprin Imuran rimadil  Advil Cope Indocin Robaxisal  Alka-Seltzer Effervescent Pain Reliever and Antacid Coricidin or Coricidin-D  Indomethacin Rufen  Alka-Seltzer plus Cold Medicine Cosprin Ketoprofen S-A-C Tablets  Anacin Analgesic Tablets  or Capsules Coumadin Korlgesic Salflex  Anacin Extra Strength Analgesic tablets or capsules CP-2 Tablets Lanoril Salicylate  Anaprox Cuprimine Capsules Levenox Salocol  Anexsia-D Dalteparin Magan Salsalate  Anodynos Darvon compound Magnesium Salicylate Sine-off  Ansaid Dasin Capsules Magsal Sodium Salicylate  Anturane Depen Capsules Marnal Soma  APF Arthritis pain formula Dewitt's Pills Measurin Stanback  Argesic Dia-Gesic Meclofenamic Sulfinpyrazone  Arthritis Bayer Timed Release Aspirin Diclofenac Meclomen Sulindac  Arthritis pain formula Anacin Dicumarol Medipren Supac  Analgesic (Safety coated) Arthralgen Diffunasal Mefanamic Suprofen  Arthritis Strength Bufferin Dihydrocodeine Mepro Compound Suprol  Arthropan liquid Dopirydamole Methcarbomol with Aspirin Synalgos  ASA tablets/Enseals Disalcid Micrainin Tagament  Ascriptin Doan's Midol Talwin  Ascriptin A/D Dolene Mobidin Tanderil  Ascriptin Extra Strength Dolobid Moblgesic Ticlid  Ascriptin with Codeine Doloprin or Doloprin with Codeine Momentum Tolectin  Asperbuf Duoprin Mono-gesic Trendar  Aspergum Duradyne Motrin or Motrin IB Triminicin  Aspirin plain, buffered or enteric coated Durasal Myochrisine Trigesic  Aspirin Suppositories Easprin Nalfon Trillsate  Aspirin with Codeine Ecotrin Regular or Extra Strength Naprosyn Uracel  Atromid-S Efficin Naproxen Ursinus  Auranofin Capsules Elmiron Neocylate Vanquish  Axotal Emagrin Norgesic Verin  Azathioprine Empirin or Empirin with Codeine Normiflo Vitamin E  Azolid Emprazil Nuprin Voltaren  Bayer Aspirin plain, buffered or children's or timed BC Tablets or powders Encaprin Orgaran Warfarin Sodium  Buff-a-Comp Enoxaparin Orudis Zorpin  Buff-a-Comp with Codeine Equegesic Os-Cal-Gesic   Buffaprin Excedrin plain, buffered or Extra Strength Oxalid   Bufferin Arthritis Strength Feldene Oxphenbutazone   Bufferin plain or Extra Strength Feldene Capsules Oxycodone with Aspirin   Bufferin  with Codeine Fenoprofen Fenoprofen Pabalate or Pabalate-SF   Buffets II Flogesic Panagesic     Buffinol plain or Extra Strength Florinal or Florinal with Codeine Panwarfarin   Buf-Tabs Flurbiprofen Penicillamine   Butalbital Compound Four-way cold tablets Penicillin   Butazolidin Fragmin Pepto-Bismol   Carbenicillin Geminisyn Percodan   Carna Arthritis Reliever Geopen Persantine   Carprofen Gold's salt Persistin   Chloramphenicol Goody's Phenylbutazone   Chloromycetin Haltrain Piroxlcam   Clmetidine heparin Plaquenil   Cllnoril Hyco-pap Ponstel   Clofibrate Hydroxy chloroquine Propoxyphen         Before stopping any of these medications, be sure to consult the physician who ordered them.  Some, such as Coumadin (Warfarin) are ordered to prevent or treat serious conditions such as "deep thrombosis", "pumonary embolisms", and other heart problems.  The amount of time that you may need off of the medication may also vary with the medication and the reason for which you were taking it.  If you are taking any of these medications, please make sure you notify your pain physician before you undergo any procedures.         Epidural Steroid Injection An epidural steroid injection is given to relieve pain in your neck, back, or legs that is caused by the irritation or swelling of a nerve root. This procedure involves injecting a steroid and numbing medicine (anesthetic) into the epidural space. The epidural space is the space between the outer covering of your spinal cord and the bones that form your backbone (vertebra).  LET [facility] CARE PROVIDER KNOW ABOUT:   Any allergies you have.  All medicines you are taking, including vitamins, herbs, eye drops, creams, and over-the-counter medicines such as aspirin.  Previous problems you or members of your family have had with the use of anesthetics.  Any blood disorders or blood clotting disorders you have.  Previous surgeries you have  had.  Medical conditions you have. RISKS AND COMPLICATIONS Generally, this is a safe procedure. However, as with any procedure, complications can occur. Possible complications of epidural steroid injection include:  Headache.  Bleeding.  Infection.  Allergic reaction to the medicines.  Damage to your nerves. The response to this procedure depends on the underlying cause of the pain and its duration. People who have long-term (chronic) pain are less likely to benefit from epidural steroids than are those people whose pain comes on strong and suddenly. BEFORE THE PROCEDURE   Ask your health care provider about changing or stopping your regular medicines. You may be advised to stop taking blood-thinning medicines a few days before the procedure.  You may be given medicines to reduce anxiety.  Arrange for someone to take you home after the procedure. PROCEDURE   You will remain awake during the procedure. You may receive medicine to make you relaxed.  You will be asked to lie on your stomach.  The injection site will be cleaned.  The injection site will be numbed with a medicine (local anesthetic).  A needle will be injected through your skin into the epidural space.  Your health care provider will use an X-ray machine to ensure that the steroid is delivered closest to the affected nerve. You may have minimal discomfort at this time.  Once the needle is in the right position, the local anesthetic and the steroid will be injected into the epidural space.  The needle will then be removed and a bandage will be applied to the injection site. AFTER THE PROCEDURE   You may be monitored for a short time before you go home.  You may feel weakness or  numbness in your arm or leg, which disappears within hours.  You may be allowed to eat, drink, and take your regular medicine.  You may have soreness at the site of the injection.   This information is not intended to replace advice  given to you by your health care provider. Make sure you discuss any questions you have with your health care provider.   Document Released: 11/09/2007 Document Revised: 04/04/2013 Document Reviewed: 01/19/2013 Elsevier Interactive Patient Education [employer].

## 2016-02-05 NOTE — Progress Notes (Signed)
Safety precautions to be maintained throughout the outpatient stay will include: orient to surroundings, keep bed in low position, maintain call bell within reach at all times, provide assistance with transfer out of bed and ambulation.  

## 2016-02-06 ENCOUNTER — Telehealth: Payer: Self-pay | Admitting: Cardiovascular Disease

## 2016-02-06 DIAGNOSIS — M79662 Pain in left lower leg: Secondary | ICD-10-CM | POA: Diagnosis not present

## 2016-02-06 DIAGNOSIS — I1310 Hypertensive heart and chronic kidney disease without heart failure, with stage 1 through stage 4 chronic kidney disease, or unspecified chronic kidney disease: Secondary | ICD-10-CM | POA: Diagnosis not present

## 2016-02-06 DIAGNOSIS — I2510 Atherosclerotic heart disease of native coronary artery without angina pectoris: Secondary | ICD-10-CM | POA: Diagnosis not present

## 2016-02-06 DIAGNOSIS — M549 Dorsalgia, unspecified: Secondary | ICD-10-CM | POA: Diagnosis not present

## 2016-02-06 DIAGNOSIS — I255 Ischemic cardiomyopathy: Secondary | ICD-10-CM | POA: Diagnosis not present

## 2016-02-06 DIAGNOSIS — E785 Hyperlipidemia, unspecified: Secondary | ICD-10-CM | POA: Diagnosis not present

## 2016-02-06 DIAGNOSIS — I5042 Chronic combined systolic (congestive) and diastolic (congestive) heart failure: Secondary | ICD-10-CM | POA: Diagnosis not present

## 2016-02-06 DIAGNOSIS — N183 Chronic kidney disease, stage 3 (moderate): Secondary | ICD-10-CM | POA: Diagnosis not present

## 2016-02-06 DIAGNOSIS — M150 Primary generalized (osteo)arthritis: Secondary | ICD-10-CM | POA: Diagnosis not present

## 2016-02-06 DIAGNOSIS — J449 Chronic obstructive pulmonary disease, unspecified: Secondary | ICD-10-CM | POA: Diagnosis not present

## 2016-02-06 NOTE — Telephone Encounter (Signed)
That's fine

## 2016-02-06 NOTE — Telephone Encounter (Signed)
S/w Stacy at [facility] of Dr. Tyrell Antonio approval to continue PT as requested.

## 2016-02-06 NOTE — Progress Notes (Signed)
Subjective:  Patient ID: Katherine [name], female    DOB: July 24, 1930  Age: 82 y.o. MRN: [id number]  CC: Back Pain      PROCEDURE:None  HPI [name] presents for a new patient evaluation. She has a long-standing history of diffuse body pain including low back pain. The pain she experiences rates at a VAS of 10 and best 7 but has gradually gotten worse to the point where she has soft hypotension. She has pain in the afternoon and night and with immobility. Aggravating factors include bending climbing kneeling motion sitting standing squatting and twisting. Alleviating factors include rest sleep lying down and medication management. In the past she has had epidural steroid injections which seem to help alleviate some of her low back pain.  Currently the pain complaints she experiences is similar to what she had before with associated tingling worse on the left posterior and lateral leg with associated weakness and pain that keeps her up at night. It is described as aching and cramping deep disabling. She has had an MRI which reveals multilevel facet degeneration in the lumbosacral spine and evidence of multilevel degenerative disc disease.  History Avri has a past medical history of Chronic combined systolic and diastolic heart failure ([place]); Hypertensive heart disease; COPD (chronic obstructive pulmonary disease) ([place]); Ischemic cardiomyopathy; CKD (chronic kidney disease), stage III; Occlusive coronary artery disease requiring drug therapy; Fibromyalgia; Hypertension; CHF (congestive heart failure) ([place]); CAD (coronary artery disease); and Hyperlipidemia.   She has past surgical history that includes Shoulder surgery; Cardiac catheterization (N/A, 10/30/2015); and Cardiac catheterization (N/A, 10/30/2015).   Her family history includes CAD in her brother and sister; Cancer in her mother.She reports that she has never smoked. She has never used smokeless tobacco. She reports that she does not drink  alcohol or use illicit drugs.  No results found for this or any previous visit.  No results found for: TOXASSSELUR  Outpatient Prescriptions Prior to Visit  Medication Sig Dispense Refill  . amLODipine (NORVASC) 2.5 MG tablet Take 1 tablet (2.5 mg total) by mouth daily. 30 tablet 3  . aspirin EC 81 MG tablet Take 81 mg by mouth daily.    Marland Kitchen atorvastatin (LIPITOR) 80 MG tablet Take 1 tablet (80 mg total) by mouth daily at 6 PM. 90 tablet 3  . baclofen (LIORESAL) 10 MG tablet Take 1 tablet (10 mg total) by mouth 3 (three) times daily. 30 tablet 0  . carvedilol (COREG) 6.25 MG tablet Take 1 tablet (6.25 mg total) by mouth 2 (two) times daily with a meal. 180 tablet 3  . clopidogrel (PLAVIX) 75 MG tablet Take 1 tablet (75 mg total) by mouth daily. 30 tablet 3  . furosemide (LASIX) 20 MG tablet Take 1 tablet (20 mg total) by mouth every other day. 30 tablet 3  . lisinopril (PRINIVIL,ZESTRIL) 20 MG tablet Take 1 tablet (20 mg total) by mouth daily. 30 tablet 3  . nitroGLYCERIN (NITROSTAT) 0.4 MG SL tablet Place 1 tablet (0.4 mg total) under the tongue every 5 (five) minutes as needed for chest pain. 25 tablet 3  . omega-3 acid ethyl esters (LOVAZA) 1 g capsule Take 1 g by mouth daily.    . pantoprazole (PROTONIX) 40 MG tablet Take 1 tablet (40 mg total) by mouth daily. 30 tablet 3  . lidocaine (LIDODERM) 5 % Place 1 patch onto the skin daily. Remove & Discard patch within 12 hours or as directed by MD (Patient not taking: Reported on 02/05/2016)  10 patch 0  . oxyCODONE (OXY IR/ROXICODONE) 5 MG immediate release tablet Take 1 tablet (5 mg total) by mouth every 8 (eight) hours as needed for severe pain. (Patient not taking: Reported on 02/05/2016) 15 tablet 0  . predniSONE (DELTASONE) 10 MG tablet Take 10-60 mg by mouth daily with breakfast. Reported on 02/05/2016     No facility-administered medications prior to visit.   Lab Results  Component Value Date   WBC 7.7 01/08/2016   HGB 12.8 01/08/2016    HCT 38.4 01/08/2016   PLT 192 01/08/2016   GLUCOSE 145* 01/08/2016   CHOL 114 12/16/2015   TRIG 126 12/16/2015   HDL 41 12/16/2015   LDLCALC 48 12/16/2015   ALT 25 01/07/2016   AST 36 01/07/2016   NA 141 01/08/2016   K 3.9 01/08/2016   CL 109 01/08/2016   CREATININE 0.92 01/08/2016   BUN 21* 01/08/2016   CO2 26 01/08/2016   TSH 0.345* 10/30/2015   INR 1.12 10/30/2015   HGBA1C 6.0* 10/30/2015    --------------------------------------------------------------------------------------------------------------------- Dg Knee 1-2 Views Left  01/07/2016  CLINICAL DATA:  Left anterior knee pain beginning today.  No injury. EXAM: LEFT KNEE - 1-2 VIEW COMPARISON:  None. FINDINGS: No evidence of fracture, dislocation, or joint effusion. Chondrocalcinosis. No notable degenerative changes for age. Osteopenia. IMPRESSION: No acute finding. Electronically Signed   By: Monte Fantasia M.D.   On: 01/07/2016 20:00   Mr Lumbar Spine Wo Contrast  01/08/2016  CLINICAL DATA:  Low back pain with left thigh pain radiating down to the left foot EXAM: MRI LUMBAR SPINE WITHOUT CONTRAST TECHNIQUE: Multiplanar, multisequence MR imaging of the lumbar spine was performed. No intravenous contrast was administered. COMPARISON:  None. FINDINGS: Segmentation:  Standard. Alignment: 8 mm of anterolisthesis of L4 on L5 secondary to bilateral L4 pars interarticularis defects. The alignment is otherwise anatomic. Vertebrae:  No fracture, evidence of discitis, or bone lesion. Conus medullaris: Extends to the T12-L1 level and appears normal. Paraspinal and other soft tissues: Negative. Disc levels: Disc spaces: Degenerative disc disease with disc height loss at L3-4, L4-5 and L5-S1. T11-12: Mild broad-based disc bulge. Mild bilateral facet arthropathy. No foraminal or central canal stenosis. T12-L1: No significant disc bulge. No evidence of neural foraminal stenosis. No central canal stenosis. L1-L2: Mild broad-based disc bulge. Mild  bilateral facet arthropathy. No evidence of neural foraminal stenosis. No central canal stenosis. L2-L3: Mild broad-based disc bulge. Moderate right facet arthropathy. Mild right foraminal stenosis. Right lateral recess stenosis. No central canal stenosis. L3-L4: Mild broad-based disc bulge. Mild bilateral facet arthropathy. Mild right foraminal stenosis. Right lateral recess stenosis. No left foraminal stenosis. No central canal stenosis. L4-L5: No disc protrusion. Grade 1 anterolisthesis of L4 on L5. Severe bilateral facet arthropathy and bilateral L4 pars interarticularis defects. Severe bilateral foraminal stenosis. Mild spinal stenosis. L5-S1: Mild broad-based disc bulge. Mild bilateral facet arthropathy. Mild left foraminal stenosis. No central canal stenosis. IMPRESSION: 1. At L4-5 there is grade 1 anterolisthesis of L4 on L5. Severe bilateral facet arthropathy and bilateral L4 pars interarticularis defects. Severe bilateral foraminal stenosis. Mild spinal stenosis. 2. At L2-3 there is a mild broad-based disc bulge. Moderate right facet arthropathy. Mild right foraminal stenosis. Right lateral recess stenosis. 3. At L3-4 there is a mild Mild broad-based disc bulge. Mild bilateral facet arthropathy. Mild right foraminal stenosis. Right lateral recess stenosis. Electronically Signed   By: Kathreen Devoid   On: 01/08/2016 09:27   Ct Cta Abd/pel W/cm &/or W/o Cm  01/07/2016  ADDENDUM REPORT: 01/07/2016 19:54 ADDENDUM: The there are a few small bilateral sub cm renal cortical hypodensities too small to characterize but likely cysts. Electronically Signed   By: Marin Olp M.D.   On: 01/07/2016 19:54  01/07/2016  CLINICAL DATA:  Sciatica.  Possible dehydration. EXAM: CTA ABDOMEN AND PELVIS wITHOUT AND WITH CONTRAST TECHNIQUE: Multidetector CT imaging of the abdomen and pelvis was performed using the standard protocol during bolus administration of intravenous contrast. Multiplanar reconstructed images and MIPs  were obtained and reviewed to evaluate the vascular anatomy. CONTRAST:  100 mL Isovue 370 IV COMPARISON:  None. FINDINGS: Lung bases are within normal. Abdominal images demonstrate multiple splenic granulomas. The liver, pancreas, gallbladder and adrenal glands are normal. Kidneys are normal in size without hydronephrosis or nephrolithiasis. There is a 2.3 cm cyst over the mid pole the right kidney. Ureters are within normal. There is mild calcified plaque over the abdominal aorta iliac arteries. Mild focal stenosis at the takeoff of the celiac axis for comparison Appendix is not well visualized. Minimal diverticulosis of the colon which is otherwise within normal. Small bowel is within normal. Mesentery is unremarkable without inflammatory change or free fluid. Pelvic images demonstrate mild bladder distension. Surgical absence of the uterus. No free pelvic fluid. Right hip arthroplasty intact. There are degenerative changes of the spine with multilevel disc disease over the lumbosacral spine mean grade 1-2 anterolisthesis of L4 on L5. Previous L3-L5 laminectomy. Several metallic densities over the inguinal regions. Review of the MIP images confirms the above findings. IMPRESSION: No acute findings in the abdomen/ pelvis. 2.3 cm right renal cyst. Minimal diverticulosis of the colon. Significant degenerative changes with multilevel disc disease over the lumbosacral spine. Grade 1-2 anterolisthesis L4 on L5. Electronically Signed: By: Marin Olp M.D. On: 01/07/2016 18:26       ---------------------------------------------------------------------------------------------------------------------- Past Medical History  Diagnosis Date  . Chronic combined systolic and diastolic heart failure (HCC)     EF 35-40%, Akinesis of mid-apicalanteroseptal wall & AK of apex, Gr 1DD.  Marland Kitchen Hypertensive heart disease   . COPD (chronic obstructive pulmonary disease) ([place])     worked in a [employer]  . Ischemic cardiomyopathy    . CKD (chronic kidney disease), stage III   . Occlusive coronary artery disease requiring drug therapy     a. Remote MI - never sought treatment when it occurred, shows up old on EKG;  b. Cath 10/2015 - 100% pLAD CTO with 80% D1, prox Cx 60% - EF ~35%  . Fibromyalgia   . Hypertension   . CHF (congestive heart failure) ([place])   . CAD (coronary artery disease)     on medical management  . Hyperlipidemia     Past Surgical History  Procedure Laterality Date  . Shoulder surgery    . Cardiac catheterization N/A 10/30/2015    Procedure: Right/Left Heart Cath and Coronary Angiography;  Surgeon: Wellington Hampshire, MD;  Location: [facility];  Service: Cardiovascular;  Laterality: N/A;  . Cardiac catheterization N/A 10/30/2015    Procedure: Intravascular Pressure Wire/FFR Study;  Surgeon: Wellington Hampshire, MD;  Location: [facility];  Service: Cardiovascular;  Laterality: N/A;    Family History  Problem Relation Age of Onset  . Cancer Mother   . CAD Sister   . CAD Brother     Social History  Substance Use Topics  . Smoking status: Never Smoker   . Smokeless tobacco: Never Used  . Alcohol Use: No    ----------------------------------------------------------------------------------------------------------------------  Social History   Social History  . Marital Status: Single    Spouse Name: N/A  . Number of Children: N/A  . Years of Education: N/A   Social History Main Topics  . Smoking status: Never Smoker   . Smokeless tobacco: Never Used  . Alcohol Use: No  . Drug Use: No  . Sexual Activity: No   Other Topics Concern  . None   Social History Narrative    Scheduled Meds: Continuous Infusions: PRN Meds:.   BP 155/60 mmHg  Pulse 57  Temp(Src) 97.7 F (36.5 C) (Oral)  Resp 16  Ht 5\' 1"  (1.549 m)  Wt 140 lb (63.504 kg)  BMI 26.47 kg/m2  SpO2 100%   BP Readings from Last 3 Encounters:  02/05/16 155/60  01/09/16 138/78  01/05/16 159/86     Wt  Readings from Last 3 Encounters:  02/05/16 140 lb (63.504 kg)  01/07/16 150 lb (68.04 kg)  01/05/16 143 lb (64.864 kg)     ----------------------------------------------------------------------------------------------------------------------  ROS Review of Systems  Cardiac: History of A. fib on a daily aspirin taking Plavix with high blood pressure and history of heart attack in March 2017. History of congestive heart failure previous heart catheterization. Pulmonary: Lung problems with history of shortness of breath Neurologic: Scoliosis and chronic low back pain Psychologic: Negative GI reflux Hematologic easy bruising and bleeding Rheumatologic history of osteoarthritis fibromyalgia and chronic fatigue syndrome and rheumatoid arthritis  Objective:  BP 155/60 mmHg  Pulse 57  Temp(Src) 97.7 F (36.5 C) (Oral)  Resp 16  Ht 5\' 1"  (1.549 m)  Wt 140 lb (63.504 kg)  BMI 26.47 kg/m2  SpO2 100%  Physical Exam Pupils are equally round reactive to light with extraocular muscles intact Patient is a good historian appropriate cooperative compliant Heart is irregular with appropriate right Lungs are clear to also dictation without rales or wheezes Inspection low back reveals some paraspinous muscle tenderness. 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 on the left her dorsiflexion is 5 over 5 and plantar flexion at 5/5     Assessment & Plan:   Mikenzie was seen today for back pain.  Diagnoses and all orders for this visit:  Spinal stenosis of lumbar region -     LUMBAR EPIDURAL STEROID INJECTION; Future  Failed back syndrome of lumbar spine -     LUMBAR EPIDURAL STEROID INJECTION; Future     ----------------------------------------------------------------------------------------------------------------------  Problem List Items  Addressed This Visit    None    Visit Diagnoses    Spinal stenosis of lumbar region    -  Primary    Relevant Orders    LUMBAR EPIDURAL STEROID INJECTION    Failed back syndrome of lumbar spine        Relevant Orders    LUMBAR EPIDURAL STEROID INJECTION       ----------------------------------------------------------------------------------------------------------------------  1. Spinal stenosis of lumbar region  - LUMBAR EPIDURAL STEROID INJECTION; Future Long discussion with her regarding the risks and benefits of epidural steroid injections. She seems to understand these and desires to comply with a repeat series. She also may be a candidate for a lumbar facet injection based on her MRI and clinical findings. We will request that she come off her Plavix prior to her first injection  2. Failed back syndrome of lumbar spine  - LUMBAR EPIDURAL STEROID  INJECTION; Future    ----------------------------------------------------------------------------------------------------------------------  I am having Ms. Heckard maintain her aspirin EC, pantoprazole, nitroGLYCERIN, atorvastatin, carvedilol, clopidogrel, lisinopril, furosemide, baclofen, omega-3 acid ethyl esters, predniSONE, oxyCODONE, lidocaine, amLODipine, and HYDROcodone-acetaminophen.   Meds ordered this encounter  Medications  . HYDROcodone-acetaminophen (NORCO/VICODIN) 5-325 MG tablet    Sig: Take 1 tablet by mouth every 4 (four) hours as needed for moderate pain.       Follow-up: Return in about 2 weeks (around 02/19/2016) for evaluation, procedure.    Molli Barrows, MD  This dictation was performed utilizing Dragon voice recognition software.  Please excuse any unintentional or mistaken typographical errors as a result of its unedited utilization.

## 2016-02-06 NOTE — Telephone Encounter (Signed)
Home Health requests PT 2x/wk x 3 weeks. If approved, can give verbal order.  Forward to MD

## 2016-02-06 NOTE — Telephone Encounter (Signed)
Wants to know if Katherine [name] will approve order for PT in home 2x week for 3 Weeks.  Please call with Verbal order (ok for nurse to call)

## 2016-02-06 NOTE — Telephone Encounter (Signed)
Received cardiac clearance from Dr. Marlowe Alt, pain management, for epidural. Placed on Ryan's desk to review and advise.

## 2016-02-09 DIAGNOSIS — M549 Dorsalgia, unspecified: Secondary | ICD-10-CM | POA: Diagnosis not present

## 2016-02-09 DIAGNOSIS — I5042 Chronic combined systolic (congestive) and diastolic (congestive) heart failure: Secondary | ICD-10-CM | POA: Diagnosis not present

## 2016-02-09 DIAGNOSIS — I1310 Hypertensive heart and chronic kidney disease without heart failure, with stage 1 through stage 4 chronic kidney disease, or unspecified chronic kidney disease: Secondary | ICD-10-CM | POA: Diagnosis not present

## 2016-02-09 DIAGNOSIS — M150 Primary generalized (osteo)arthritis: Secondary | ICD-10-CM | POA: Diagnosis not present

## 2016-02-09 DIAGNOSIS — E785 Hyperlipidemia, unspecified: Secondary | ICD-10-CM | POA: Diagnosis not present

## 2016-02-09 DIAGNOSIS — M79662 Pain in left lower leg: Secondary | ICD-10-CM | POA: Diagnosis not present

## 2016-02-09 DIAGNOSIS — I255 Ischemic cardiomyopathy: Secondary | ICD-10-CM | POA: Diagnosis not present

## 2016-02-09 DIAGNOSIS — N183 Chronic kidney disease, stage 3 (moderate): Secondary | ICD-10-CM | POA: Diagnosis not present

## 2016-02-09 DIAGNOSIS — J449 Chronic obstructive pulmonary disease, unspecified: Secondary | ICD-10-CM | POA: Diagnosis not present

## 2016-02-09 DIAGNOSIS — I2510 Atherosclerotic heart disease of native coronary artery without angina pectoris: Secondary | ICD-10-CM | POA: Diagnosis not present

## 2016-02-10 NOTE — Telephone Encounter (Signed)
Signed and on my desk 

## 2016-02-10 NOTE — Telephone Encounter (Signed)
Faxed cardiac clearance for epidural to [facility] Management at [facility], [phone]

## 2016-02-11 DIAGNOSIS — I5042 Chronic combined systolic (congestive) and diastolic (congestive) heart failure: Secondary | ICD-10-CM | POA: Diagnosis not present

## 2016-02-11 DIAGNOSIS — M79662 Pain in left lower leg: Secondary | ICD-10-CM | POA: Diagnosis not present

## 2016-02-11 DIAGNOSIS — N183 Chronic kidney disease, stage 3 (moderate): Secondary | ICD-10-CM | POA: Diagnosis not present

## 2016-02-11 DIAGNOSIS — I2510 Atherosclerotic heart disease of native coronary artery without angina pectoris: Secondary | ICD-10-CM | POA: Diagnosis not present

## 2016-02-11 DIAGNOSIS — I255 Ischemic cardiomyopathy: Secondary | ICD-10-CM | POA: Diagnosis not present

## 2016-02-11 DIAGNOSIS — E785 Hyperlipidemia, unspecified: Secondary | ICD-10-CM | POA: Diagnosis not present

## 2016-02-11 DIAGNOSIS — J449 Chronic obstructive pulmonary disease, unspecified: Secondary | ICD-10-CM | POA: Diagnosis not present

## 2016-02-11 DIAGNOSIS — M150 Primary generalized (osteo)arthritis: Secondary | ICD-10-CM | POA: Diagnosis not present

## 2016-02-11 DIAGNOSIS — I1310 Hypertensive heart and chronic kidney disease without heart failure, with stage 1 through stage 4 chronic kidney disease, or unspecified chronic kidney disease: Secondary | ICD-10-CM | POA: Diagnosis not present

## 2016-02-11 DIAGNOSIS — M549 Dorsalgia, unspecified: Secondary | ICD-10-CM | POA: Diagnosis not present

## 2016-02-13 ENCOUNTER — Telehealth: Payer: Self-pay

## 2016-02-13 NOTE — Telephone Encounter (Signed)
Pt wants an appt with Dr Andree Elk pts niece called and wanted to know when could she come in

## 2016-02-20 ENCOUNTER — Telehealth: Payer: Self-pay | Admitting: Cardiovascular Disease

## 2016-02-20 ENCOUNTER — Telehealth: Payer: Self-pay | Admitting: *Deleted

## 2016-02-20 NOTE — Telephone Encounter (Signed)
S/w Pamalee Leyden, home health nurse, who reports when he arrived at pt home today for PT, family notified him that she had fallen earlier same day. States she did not complain of pain and able to participate in PT w/no issues.  S/w pt who states she did not actually fall to the ground but sat down hard on the couch as she tripped over her dog. Denies any pain at this time. She will continue to monitor and report any sx. Pt appreciative of the call with no questions at this time.

## 2016-02-20 NOTE — Telephone Encounter (Signed)
[facility] home health nurse calling  Would like [place] to know patient had a fall Lost her balance, was trying to get away from her house dog. Was able to do her PT today. If we have any questions or concerns to call back

## 2016-02-20 NOTE — Telephone Encounter (Signed)
Spoke with patient she already has an appt for February 23, 2016 with Dr Andree Elk.

## 2016-02-23 ENCOUNTER — Ambulatory Visit: Payer: Medicare HMO | Attending: Anesthesiology | Admitting: Anesthesiology

## 2016-02-23 ENCOUNTER — Encounter: Payer: Self-pay | Admitting: Anesthesiology

## 2016-02-23 DIAGNOSIS — M79606 Pain in leg, unspecified: Secondary | ICD-10-CM | POA: Insufficient documentation

## 2016-02-23 DIAGNOSIS — M25562 Pain in left knee: Secondary | ICD-10-CM | POA: Diagnosis not present

## 2016-02-23 DIAGNOSIS — M961 Postlaminectomy syndrome, not elsewhere classified: Secondary | ICD-10-CM

## 2016-02-23 DIAGNOSIS — Z96641 Presence of right artificial hip joint: Secondary | ICD-10-CM | POA: Diagnosis not present

## 2016-02-23 DIAGNOSIS — I130 Hypertensive heart and chronic kidney disease with heart failure and stage 1 through stage 4 chronic kidney disease, or unspecified chronic kidney disease: Secondary | ICD-10-CM | POA: Diagnosis not present

## 2016-02-23 DIAGNOSIS — I255 Ischemic cardiomyopathy: Secondary | ICD-10-CM | POA: Insufficient documentation

## 2016-02-23 DIAGNOSIS — E785 Hyperlipidemia, unspecified: Secondary | ICD-10-CM | POA: Insufficient documentation

## 2016-02-23 DIAGNOSIS — I2510 Atherosclerotic heart disease of native coronary artery without angina pectoris: Secondary | ICD-10-CM | POA: Insufficient documentation

## 2016-02-23 DIAGNOSIS — I5042 Chronic combined systolic (congestive) and diastolic (congestive) heart failure: Secondary | ICD-10-CM | POA: Diagnosis not present

## 2016-02-23 DIAGNOSIS — N281 Cyst of kidney, acquired: Secondary | ICD-10-CM | POA: Insufficient documentation

## 2016-02-23 DIAGNOSIS — M5126 Other intervertebral disc displacement, lumbar region: Secondary | ICD-10-CM | POA: Diagnosis not present

## 2016-02-23 DIAGNOSIS — M5124 Other intervertebral disc displacement, thoracic region: Secondary | ICD-10-CM | POA: Diagnosis not present

## 2016-02-23 DIAGNOSIS — I252 Old myocardial infarction: Secondary | ICD-10-CM | POA: Diagnosis not present

## 2016-02-23 DIAGNOSIS — J449 Chronic obstructive pulmonary disease, unspecified: Secondary | ICD-10-CM | POA: Diagnosis not present

## 2016-02-23 DIAGNOSIS — Z7902 Long term (current) use of antithrombotics/antiplatelets: Secondary | ICD-10-CM | POA: Insufficient documentation

## 2016-02-23 DIAGNOSIS — Z8249 Family history of ischemic heart disease and other diseases of the circulatory system: Secondary | ICD-10-CM | POA: Insufficient documentation

## 2016-02-23 DIAGNOSIS — M4806 Spinal stenosis, lumbar region: Secondary | ICD-10-CM | POA: Diagnosis not present

## 2016-02-23 DIAGNOSIS — M797 Fibromyalgia: Secondary | ICD-10-CM | POA: Diagnosis not present

## 2016-02-23 DIAGNOSIS — M545 Low back pain: Secondary | ICD-10-CM | POA: Insufficient documentation

## 2016-02-23 DIAGNOSIS — M8580 Other specified disorders of bone density and structure, unspecified site: Secondary | ICD-10-CM | POA: Diagnosis not present

## 2016-02-23 DIAGNOSIS — M48061 Spinal stenosis, lumbar region without neurogenic claudication: Secondary | ICD-10-CM

## 2016-02-23 DIAGNOSIS — M549 Dorsalgia, unspecified: Secondary | ICD-10-CM | POA: Diagnosis present

## 2016-02-23 MED ORDER — ROPIVACAINE HCL 2 MG/ML IJ SOLN: 10.0000 mL | Freq: Once | INTRAMUSCULAR | Status: DC

## 2016-02-23 MED ORDER — FENTANYL CITRATE (PF) 100 MCG/2ML IJ SOLN
INTRAMUSCULAR | Status: AC
  Filled 2016-02-23: qty 2

## 2016-02-23 MED ORDER — SODIUM CHLORIDE 0.9 % IJ SOLN
INTRAMUSCULAR | Status: AC
  Administered 2016-02-23: 15:00:00
  Filled 2016-02-23: qty 10

## 2016-02-23 MED ORDER — IOPAMIDOL (ISOVUE-M 200) INJECTION 41%
INTRAMUSCULAR | Status: AC
  Administered 2016-02-23: 15:00:00
  Filled 2016-02-23: qty 10

## 2016-02-23 MED ORDER — IOPAMIDOL (ISOVUE-M 200) INJECTION 41%: 20.0000 mL | Freq: Once | INTRAMUSCULAR | Status: DC | PRN

## 2016-02-23 MED ORDER — SODIUM CHLORIDE 0.9% FLUSH: 10.0000 mL | Freq: Once | INTRAVENOUS | Status: DC

## 2016-02-23 MED ORDER — HYDROCODONE-ACETAMINOPHEN 5-325 MG PO TABS: 1.0000 | ORAL_TABLET | ORAL | Status: DC | PRN

## 2016-02-23 MED ORDER — TRIAMCINOLONE ACETONIDE 40 MG/ML IJ SUSP
INTRAMUSCULAR | Status: AC
  Administered 2016-02-23: 15:00:00
  Filled 2016-02-23: qty 1

## 2016-02-23 MED ORDER — TRIAMCINOLONE ACETONIDE 40 MG/ML IJ SUSP: 40.0000 mg | Freq: Once | INTRAMUSCULAR | Status: DC

## 2016-02-23 MED ORDER — MIDAZOLAM HCL 5 MG/5ML IJ SOLN
INTRAMUSCULAR | Status: AC
  Administered 2016-02-23: 2 mg via INTRAVENOUS
  Filled 2016-02-23: qty 5

## 2016-02-23 MED ORDER — MIDAZOLAM HCL 2 MG/2ML IJ SOLN: 5.0000 mg | Freq: Once | INTRAMUSCULAR | Status: DC

## 2016-02-23 MED ORDER — LIDOCAINE HCL (PF) 1 % IJ SOLN: 5.0000 mL | Freq: Once | INTRAMUSCULAR | Status: DC

## 2016-02-23 MED ORDER — ROPIVACAINE HCL 2 MG/ML IJ SOLN
INTRAMUSCULAR | Status: AC
  Administered 2016-02-23: 15:00:00
  Filled 2016-02-23: qty 10

## 2016-02-23 MED ORDER — LIDOCAINE HCL (PF) 1 % IJ SOLN
INTRAMUSCULAR | Status: AC
  Administered 2016-02-23: 15:00:00
  Filled 2016-02-23: qty 5

## 2016-02-23 MED ORDER — LACTATED RINGERS IV SOLN: 1000.0000 mL | INTRAVENOUS | Status: DC

## 2016-02-23 NOTE — Patient Instructions (Signed)
Epidural Steroid Injection Patient Information  Description: The epidural space surrounds the nerves as they exit the spinal cord.  In some patients, the nerves can be compressed and inflamed by a bulging disc or a tight spinal canal (spinal stenosis).  By injecting steroids into the epidural space, we can bring irritated nerves into direct contact with a potentially helpful medication.  These steroids act directly on the irritated nerves and can reduce swelling and inflammation which often leads to decreased pain.  Epidural steroids may be injected anywhere along the spine and from the neck to the low back depending upon the location of your pain.   After numbing the skin with local anesthetic (like Novocaine), a small needle is passed into the epidural space slowly.  You may experience a sensation of pressure while this is being done.  The entire block usually last less than 10 minutes.  Conditions which may be treated by epidural steroids:   Low back and leg pain  Neck and arm pain  Spinal stenosis  Post-laminectomy syndrome  Herpes zoster (shingles) pain  Pain from compression fractures  Preparation for the injection:  1. Do not eat any solid food or dairy products within 8 hours of your appointment.  2. You may drink clear liquids up to 3 hours before appointment.  Clear liquids include water, black coffee, juice or soda.  No milk or cream please. 3. You may take your regular medication, including pain medications, with a sip of water before your appointment  Diabetics should hold regular insulin (if taken separately) and take 1/2 normal NPH dos the morning of the procedure.  Carry some sugar containing items with you to your appointment. 4. A driver must accompany you and be prepared to drive you home after your procedure.  5. Bring all your current medications with your. 6. An IV may be inserted and sedation may be given at the discretion of the physician.   7. A blood pressure  cuff, EKG and other monitors will often be applied during the procedure.  Some patients may need to have extra oxygen administered for a short period. 8. You will be asked to provide medical information, including your allergies, prior to the procedure.  We must know immediately if you are taking blood thinners (like Coumadin/Warfarin)  Or if you are allergic to IV iodine contrast (dye). We must know if you could possible be pregnant.  Possible side-effects:  Bleeding from needle site  Infection (rare, may require surgery)  Nerve injury (rare)  Numbness & tingling (temporary)  Difficulty urinating (rare, temporary)  Spinal headache ( a headache worse with upright posture)  Light -headedness (temporary)  Pain at injection site (several days)  Decreased blood pressure (temporary)  Weakness in arm/leg (temporary)  Pressure sensation in back/neck (temporary)  Call if you experience:  Fever/chills associated with headache or increased back/neck pain.  Headache worsened by an upright position.  New onset weakness or numbness of an extremity below the injection site  Hives or difficulty breathing (go to the emergency room)  Inflammation or drainage at the infection site  Severe back/neck pain  Any new symptoms which are concerning to you  Please note:  Although the local anesthetic injected can often make your back or neck feel good for several hours after the injection, the pain will likely return.  It takes 3-7 days for steroids to work in the epidural space.  You may not notice any pain relief for at least that one week.    If effective, we will often do a series of three injections spaced 3-6 weeks apart to maximally decrease your pain.  After the initial series, we generally will wait several months before considering a repeat injection of the same type.  If you have any questions, please call [phone] [facility]  Be careful  moving about. Muscle spasms in the area of the injection may occur.  Use ice for the next 24 hours (15 minutes on, 15 minutes off).  After 24 hours, you may use heat for comfort if you wish.  Post procedure numbness or redness is expected, average 4-6 hours.  If numbness develops after 4-6 hours and is felt to be progressing and worsening, immediately contact your physician.  A prescription for HYDROCODONE was given to you today.

## 2016-02-23 NOTE — Progress Notes (Signed)
Safety precautions to be maintained throughout the outpatient stay will include: orient to surroundings, keep bed in low position, maintain call bell within reach at all times, provide assistance with transfer out of bed and ambulation.  

## 2016-02-24 ENCOUNTER — Telehealth: Payer: Self-pay | Admitting: *Deleted

## 2016-02-24 NOTE — Telephone Encounter (Signed)
Spoke with patient re; procedure on yesterday, states her legs still hurt but not as much as before.  Patient asked to be transferred up to secretaries to schedule her next appt with DR Andree Elk.

## 2016-02-24 NOTE — Progress Notes (Signed)
Subjective:  Patient ID: Katherine [name], female    DOB: February 16, 1930  Age: 82 y.o. MRN: [id number]  CC: Back Pain and Leg Pain   Service Provided on Last Visit: Evaluation  PROCEDURECaudal epidural steroid No. 1 of series with fluoroscopic guidance and moderate sedation  HPI Katherine [name] presents for a re- evaluation. She has a long-standing history of diffuse body pain including low back pain. The pain she experiences rates at a VAS of 10 and best 7 but has gradually gotten worse to the point where she has soft hypotension. She has pain in the afternoon and night and with immobility. Aggravating factors include bending climbing kneeling motion sitting standing squatting and twisting. Alleviating factors include rest sleep lying down and medication management. In the past she has had epidural steroid injections which seem to help alleviate some of her low back pain.  Currently the pain complaints she experiences is similar to what she had before with associated tingling worse on the left posterior and lateral leg with associated weakness and pain that keeps her up at night. It is described as aching and cramping deep disabling. She has had an MRI which reveals multilevel facet degeneration in the lumbosacral spine and evidence of multilevel degenerative disc disease.  Since her last visit she reports no significant changes in her lower extremity strength or function.  History Tearra has a past medical history of Chronic combined systolic and diastolic heart failure ([place]); Hypertensive heart disease; COPD (chronic obstructive pulmonary disease) ([place]); Ischemic cardiomyopathy; CKD (chronic kidney disease), stage III; Occlusive coronary artery disease requiring drug therapy; Fibromyalgia; Hypertension; CHF (congestive heart failure) ([place]); CAD (coronary artery disease); and Hyperlipidemia.   She has past surgical history that includes Shoulder surgery; Cardiac catheterization (N/A, 10/30/2015); and Cardiac  catheterization (N/A, 10/30/2015).   Her family history includes CAD in her brother and sister; Cancer in her mother.She reports that she has never smoked. She has never used smokeless tobacco. She reports that she does not drink alcohol or use illicit drugs.  No results found for this or any previous visit.  No results found for: TOXASSSELUR  Outpatient Prescriptions Prior to Visit  Medication Sig Dispense Refill  . amLODipine (NORVASC) 2.5 MG tablet Take 1 tablet (2.5 mg total) by mouth daily. 30 tablet 3  . aspirin EC 81 MG tablet Take 81 mg by mouth daily.    Marland Kitchen atorvastatin (LIPITOR) 80 MG tablet Take 1 tablet (80 mg total) by mouth daily at 6 PM. 90 tablet 3  . baclofen (LIORESAL) 10 MG tablet Take 1 tablet (10 mg total) by mouth 3 (three) times daily. 30 tablet 0  . carvedilol (COREG) 6.25 MG tablet Take 1 tablet (6.25 mg total) by mouth 2 (two) times daily with a meal. 180 tablet 3  . clopidogrel (PLAVIX) 75 MG tablet Take 1 tablet (75 mg total) by mouth daily. 30 tablet 3  . furosemide (LASIX) 20 MG tablet Take 1 tablet (20 mg total) by mouth every other day. 30 tablet 3  . lidocaine (LIDODERM) 5 % Place 1 patch onto the skin daily. Remove & Discard patch within 12 hours or as directed by MD 10 patch 0  . lisinopril (PRINIVIL,ZESTRIL) 20 MG tablet Take 1 tablet (20 mg total) by mouth daily. 30 tablet 3  . nitroGLYCERIN (NITROSTAT) 0.4 MG SL tablet Place 1 tablet (0.4 mg total) under the tongue every 5 (five) minutes as needed for chest pain. 25 tablet 3  . omega-3 acid ethyl  esters (LOVAZA) 1 g capsule Take 1 g by mouth daily.    Marland Kitchen oxyCODONE (OXY IR/ROXICODONE) 5 MG immediate release tablet Take 1 tablet (5 mg total) by mouth every 8 (eight) hours as needed for severe pain. 15 tablet 0  . pantoprazole (PROTONIX) 40 MG tablet Take 1 tablet (40 mg total) by mouth daily. 30 tablet 3  . predniSONE (DELTASONE) 10 MG tablet Take 10-60 mg by mouth daily with breakfast. Reported on 02/05/2016     . HYDROcodone-acetaminophen (NORCO/VICODIN) 5-325 MG tablet Take 1 tablet by mouth every 4 (four) hours as needed for moderate pain.     No facility-administered medications prior to visit.   Lab Results  Component Value Date   WBC 7.7 01/08/2016   HGB 12.8 01/08/2016   HCT 38.4 01/08/2016   PLT 192 01/08/2016   GLUCOSE 145* 01/08/2016   CHOL 114 12/16/2015   TRIG 126 12/16/2015   HDL 41 12/16/2015   LDLCALC 48 12/16/2015   ALT 25 01/07/2016   AST 36 01/07/2016   NA 141 01/08/2016   K 3.9 01/08/2016   CL 109 01/08/2016   CREATININE 0.92 01/08/2016   BUN 21* 01/08/2016   CO2 26 01/08/2016   TSH 0.345* 10/30/2015   INR 1.12 10/30/2015   HGBA1C 6.0* 10/30/2015    --------------------------------------------------------------------------------------------------------------------- Dg Knee 1-2 Views Left  01/07/2016  CLINICAL DATA:  Left anterior knee pain beginning today.  No injury. EXAM: LEFT KNEE - 1-2 VIEW COMPARISON:  None. FINDINGS: No evidence of fracture, dislocation, or joint effusion. Chondrocalcinosis. No notable degenerative changes for age. Osteopenia. IMPRESSION: No acute finding. Electronically Signed   By: Monte Fantasia M.D.   On: 01/07/2016 20:00   Mr Lumbar Spine Wo Contrast  01/08/2016  CLINICAL DATA:  Low back pain with left thigh pain radiating down to the left foot EXAM: MRI LUMBAR SPINE WITHOUT CONTRAST TECHNIQUE: Multiplanar, multisequence MR imaging of the lumbar spine was performed. No intravenous contrast was administered. COMPARISON:  None. FINDINGS: Segmentation:  Standard. Alignment: 8 mm of anterolisthesis of L4 on L5 secondary to bilateral L4 pars interarticularis defects. The alignment is otherwise anatomic. Vertebrae:  No fracture, evidence of discitis, or bone lesion. Conus medullaris: Extends to the T12-L1 level and appears normal. Paraspinal and other soft tissues: Negative. Disc levels: Disc spaces: Degenerative disc disease with disc height loss  at L3-4, L4-5 and L5-S1. T11-12: Mild broad-based disc bulge. Mild bilateral facet arthropathy. No foraminal or central canal stenosis. T12-L1: No significant disc bulge. No evidence of neural foraminal stenosis. No central canal stenosis. L1-L2: Mild broad-based disc bulge. Mild bilateral facet arthropathy. No evidence of neural foraminal stenosis. No central canal stenosis. L2-L3: Mild broad-based disc bulge. Moderate right facet arthropathy. Mild right foraminal stenosis. Right lateral recess stenosis. No central canal stenosis. L3-L4: Mild broad-based disc bulge. Mild bilateral facet arthropathy. Mild right foraminal stenosis. Right lateral recess stenosis. No left foraminal stenosis. No central canal stenosis. L4-L5: No disc protrusion. Grade 1 anterolisthesis of L4 on L5. Severe bilateral facet arthropathy and bilateral L4 pars interarticularis defects. Severe bilateral foraminal stenosis. Mild spinal stenosis. L5-S1: Mild broad-based disc bulge. Mild bilateral facet arthropathy. Mild left foraminal stenosis. No central canal stenosis. IMPRESSION: 1. At L4-5 there is grade 1 anterolisthesis of L4 on L5. Severe bilateral facet arthropathy and bilateral L4 pars interarticularis defects. Severe bilateral foraminal stenosis. Mild spinal stenosis. 2. At L2-3 there is a mild broad-based disc bulge. Moderate right facet arthropathy. Mild right foraminal stenosis. Right lateral recess  stenosis. 3. At L3-4 there is a mild Mild broad-based disc bulge. Mild bilateral facet arthropathy. Mild right foraminal stenosis. Right lateral recess stenosis. Electronically Signed   By: Kathreen Devoid   On: 01/08/2016 09:27   Ct Cta Abd/pel W/cm &/or W/o Cm  01/07/2016  ADDENDUM REPORT: 01/07/2016 19:54 ADDENDUM: The there are a few small bilateral sub cm renal cortical hypodensities too small to characterize but likely cysts. Electronically Signed   By: Marin Olp M.D.   On: 01/07/2016 19:54  01/07/2016  CLINICAL DATA:   Sciatica.  Possible dehydration. EXAM: CTA ABDOMEN AND PELVIS wITHOUT AND WITH CONTRAST TECHNIQUE: Multidetector CT imaging of the abdomen and pelvis was performed using the standard protocol during bolus administration of intravenous contrast. Multiplanar reconstructed images and MIPs were obtained and reviewed to evaluate the vascular anatomy. CONTRAST:  100 mL Isovue 370 IV COMPARISON:  None. FINDINGS: Lung bases are within normal. Abdominal images demonstrate multiple splenic granulomas. The liver, pancreas, gallbladder and adrenal glands are normal. Kidneys are normal in size without hydronephrosis or nephrolithiasis. There is a 2.3 cm cyst over the mid pole the right kidney. Ureters are within normal. There is mild calcified plaque over the abdominal aorta iliac arteries. Mild focal stenosis at the takeoff of the celiac axis for comparison Appendix is not well visualized. Minimal diverticulosis of the colon which is otherwise within normal. Small bowel is within normal. Mesentery is unremarkable without inflammatory change or free fluid. Pelvic images demonstrate mild bladder distension. Surgical absence of the uterus. No free pelvic fluid. Right hip arthroplasty intact. There are degenerative changes of the spine with multilevel disc disease over the lumbosacral spine mean grade 1-2 anterolisthesis of L4 on L5. Previous L3-L5 laminectomy. Several metallic densities over the inguinal regions. Review of the MIP images confirms the above findings. IMPRESSION: No acute findings in the abdomen/ pelvis. 2.3 cm right renal cyst. Minimal diverticulosis of the colon. Significant degenerative changes with multilevel disc disease over the lumbosacral spine. Grade 1-2 anterolisthesis L4 on L5. Electronically Signed: By: Marin Olp M.D. On: 01/07/2016 18:26       ---------------------------------------------------------------------------------------------------------------------- Past Medical History  Diagnosis  Date  . Chronic combined systolic and diastolic heart failure (HCC)     EF 35-40%, Akinesis of mid-apicalanteroseptal wall & AK of apex, Gr 1DD.  Marland Kitchen Hypertensive heart disease   . COPD (chronic obstructive pulmonary disease) ([place])     worked in a [place]  . Ischemic cardiomyopathy   . CKD (chronic kidney disease), stage III   . Occlusive coronary artery disease requiring drug therapy     a. Remote MI - never sought treatment when it occurred, shows up old on EKG;  b. Cath 10/2015 - 100% pLAD CTO with 80% D1, prox Cx 60% - EF ~35%  . Fibromyalgia   . Hypertension   . CHF (congestive heart failure) ([place])   . CAD (coronary artery disease)     on medical management  . Hyperlipidemia     Past Surgical History  Procedure Laterality Date  . Shoulder surgery    . Cardiac catheterization N/A 10/30/2015    Procedure: Right/Left Heart Cath and Coronary Angiography;  Surgeon: Wellington Hampshire, MD;  Location: [facility];  Service: Cardiovascular;  Laterality: N/A;  . Cardiac catheterization N/A 10/30/2015    Procedure: Intravascular Pressure Wire/FFR Study;  Surgeon: Wellington Hampshire, MD;  Location: [facility];  Service: Cardiovascular;  Laterality: N/A;    Family History  Problem  Relation Age of Onset  . Cancer Mother   . CAD Sister   . CAD Brother     Social History  Substance Use Topics  . Smoking status: Never Smoker   . Smokeless tobacco: Never Used  . Alcohol Use: No    ---------------------------------------------------------------------------------------------------------------------- Social History   Social History  . Marital Status: Single    Spouse Name: N/A  . Number of Children: N/A  . Years of Education: N/A   Social History Main Topics  . Smoking status: Never Smoker   . Smokeless tobacco: Never Used  . Alcohol Use: No  . Drug Use: No  . Sexual Activity: No   Other Topics Concern  . None   Social History Narrative    Scheduled  Meds: Continuous Infusions: PRN Meds:.   BP 175/88 mmHg  Pulse 78  Temp(Src) 98.3 F (36.8 C) (Temporal)  Resp 18  Ht 5\' 1"  (1.549 m)  Wt 136 lb (61.689 kg)  BMI 25.71 kg/m2  SpO2 100%   BP Readings from Last 3 Encounters:  02/23/16 175/88  02/05/16 155/60  01/09/16 138/78     Wt Readings from Last 3 Encounters:  02/23/16 136 lb (61.689 kg)  02/05/16 140 lb (63.504 kg)  01/07/16 150 lb (68.04 kg)     ----------------------------------------------------------------------------------------------------------------------  ROS Review of Systems  Cardiac: History of A. fib on a daily aspirin taking Plavix this was DC'd approximately 10 days ago. Also high blood pressure and history of heart attack in March 2017. History of congestive heart failure previous heart catheterization. Pulmonary: Lung problems with history of shortness of breath Neurologic: Scoliosis and chronic low back pain Psychologic: Negative GI reflux Hematologic easy bruising and bleeding Rheumatologic history of osteoarthritis fibromyalgia and chronic fatigue syndrome and rheumatoid arthritis  Objective:  BP 175/88 mmHg  Pulse 78  Temp(Src) 98.3 F (36.8 C) (Temporal)  Resp 18  Ht 5\' 1"  (1.549 m)  Wt 136 lb (61.689 kg)  BMI 25.71 kg/m2  SpO2 100%  Physical Exam Pupils are equally round reactive to light with extraocular muscles intact Patient is a good historian appropriate cooperative compliant Heart is irregular with appropriate right Lungs are clear to also dictation without rales or wheezes Inspection low back reveals some paraspinous muscle tenderness. 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 on the left her dorsiflexion is 5 over 5 and plantar flexion at 5/5     Assessment & Plan:   Katherine [name] was seen today for back pain and leg pain.  Diagnoses  and all orders for this visit:  Spinal stenosis of lumbar region -     LUMBAR EPIDURAL STEROID INJECTION -     triamcinolone acetonide (KENALOG-40) injection 40 mg; 1 mL (40 mg total) by Other route once. -     sodium chloride flush (NS) 0.9 % injection 10 mL; 10 mLs by Other route once. -     ropivacaine (PF) 2 mg/ml (0.2%) (NAROPIN) epidural 10 mL; 10 mLs by Epidural route once. -     midazolam (VERSED) injection 5 mg; Inject 5 mLs (5 mg total) into the vein once. -     lidocaine (PF) (XYLOCAINE) 1 % injection 5 mL; Inject 5 mLs into the skin once. -     lactated ringers infusion 1,000 mL; Inject 1,000 mLs into the vein continuous. -  iopamidol (ISOVUE-M) 41 % intrathecal injection 20 mL; 20 mLs by Other route once as needed for contrast. -     LUMBAR EPIDURAL STEROID INJECTION; Future  Failed back syndrome of lumbar spine -     LUMBAR EPIDURAL STEROID INJECTION -     LUMBAR EPIDURAL STEROID INJECTION; Future  Other orders -     HYDROcodone-acetaminophen (NORCO/VICODIN) 5-325 MG tablet; Take 1 tablet by mouth every 4 (four) hours as needed for moderate pain. -     ropivacaine (PF) 2 mg/ml (0.2%) (NAROPIN) 2 MG/ML epidural;  -     iopamidol (ISOVUE-M) 41 % intrathecal injection;  -     lidocaine (PF) (XYLOCAINE) 1 % injection;  -     sodium chloride 0.9 % injection;  -     Discontinue: fentaNYL (SUBLIMAZE) 100 MCG/2ML injection;  -     triamcinolone acetonide (KENALOG-40) 40 MG/ML injection;  -     midazolam (VERSED) 5 MG/5ML injection;      ----------------------------------------------------------------------------------------------------------------------  Problem List Items Addressed This Visit    None    Visit Diagnoses    Spinal stenosis of lumbar region        Relevant Medications    triamcinolone acetonide (KENALOG-40) injection 40 mg    sodium chloride flush (NS) 0.9 % injection 10 mL    ropivacaine (PF) 2 mg/ml (0.2%) (NAROPIN) epidural 10 mL    midazolam  (VERSED) injection 5 mg    lidocaine (PF) (XYLOCAINE) 1 % injection 5 mL    lactated ringers infusion 1,000 mL    iopamidol (ISOVUE-M) 41 % intrathecal injection 20 mL    Other Relevant Orders    LUMBAR EPIDURAL STEROID INJECTION    Failed back syndrome of lumbar spine        Relevant Orders    LUMBAR EPIDURAL STEROID INJECTION       ----------------------------------------------------------------------------------------------------------------------  1. Spinal stenosis of lumbar region  - We will proceed with her first lumbar epidural steroid today. She has been off her Plavix for 10-11 days. We'll have her restart that this evening. She is to return to clinic in 1 month for reevaluation and possible repeat injection 2. Failed back syndrome of lumbar spine  - LUMBAR EPIDURAL STEROID INJECTION; Future    ----------------------------------------------------------------------------------------------------------------------  I am having Katherine [name] maintain her aspirin EC, pantoprazole, nitroGLYCERIN, atorvastatin, carvedilol, clopidogrel, lisinopril, furosemide, baclofen, omega-3 acid ethyl esters, predniSONE, oxyCODONE, lidocaine, amLODipine, and HYDROcodone-acetaminophen. We administered ropivacaine (PF) 2 mg/ml (0.2%), iopamidol, lidocaine (PF), sodium chloride, triamcinolone acetonide, and midazolam. We will continue to administer triamcinolone acetonide, sodium chloride flush, ropivacaine (PF) 2 mg/ml (0.2%), midazolam, lidocaine (PF), lactated ringers, and iopamidol.   Meds ordered this encounter  Medications  . triamcinolone acetonide (KENALOG-40) injection 40 mg    Sig:   . sodium chloride flush (NS) 0.9 % injection 10 mL    Sig:   . ropivacaine (PF) 2 mg/ml (0.2%) (NAROPIN) epidural 10 mL    Sig:   . midazolam (VERSED) injection 5 mg    Sig:   . lidocaine (PF) (XYLOCAINE) 1 % injection 5 mL    Sig:   . lactated ringers infusion 1,000 mL    Sig:   . iopamidol (ISOVUE-M)  41 % intrathecal injection 20 mL    Sig:   . HYDROcodone-acetaminophen (NORCO/VICODIN) 5-325 MG tablet    Sig: Take 1 tablet by mouth every 4 (four) hours as needed for moderate pain.    Dispense:  60 tablet    Refill:  0    Do not fill until [id number]  . ropivacaine (PF) 2 mg/ml (0.2%) (NAROPIN) 2 MG/ML epidural    Sig:     Sharlett Iles, Delores: cabinet override  . iopamidol (ISOVUE-M) 41 % intrathecal injection    Sig:     Sharlett Iles, Delores: cabinet override  . lidocaine (PF) (XYLOCAINE) 1 % injection    Sig:     Sharlett Iles, Delores: cabinet override  . sodium chloride 0.9 % injection    Sig:     Sharlett Iles, Delores: cabinet override  . DISCONTD: fentaNYL (SUBLIMAZE) 100 MCG/2ML injection    Sig:     Sharlett Iles, Delores: cabinet override  . triamcinolone acetonide (KENALOG-40) 40 MG/ML injection    Sig:     Sharlett Iles, Delores: cabinet override  . midazolam (VERSED) 5 MG/5ML injection    Sig:     Sharlett Iles, Delores: cabinet override     Procedure: Caudal ESI with fluoroscopic guidance and moderate sedation  After informed consent was obtained and the risks benefits reviewed patient chose to pursue a caudal epidural steroid injection today. With the patient in the prone position and an IV in place, sedation was with IV versed. Using AP and lateral fluoroscopic guidance I identified the area overlying the sacral hiatus. This area was broadly prepped with DuraPrep 3 and we utilized strict aseptic technique during the procedure. 1% lidocaine was infiltrated 2 cc using a 25-gauge needle overlying the sacral cornu and then using lateral fluoroscopic guidance I advanced an 18-gauge Touhy needle approximately 2 cm through the sacral hiatus. Confirmation was with 2 cc of Isovue yielding good epidural spread and no evidence of IV or subarachnoid uptake. This was followed by an injection of 5 cc of saline mixed with 1 cc of ropivacaine 0.2% and 40 mg of triamcinolone. This was tolerated without  difficulty the patient was convalesced and discharged home in stable condition for follow-up as mentioned. JA  Follow-up: Return in about 1 month (around 03/25/2016) for evaluation, procedure.    Molli Barrows, MD  This dictation was performed utilizing Dragon voice recognition software.  Please excuse any unintentional or mistaken typographical errors as a result of its unedited utilization.

## 2016-02-25 ENCOUNTER — Ambulatory Visit (INDEPENDENT_AMBULATORY_CARE_PROVIDER_SITE_OTHER): Payer: Medicare HMO | Admitting: Family Medicine

## 2016-02-25 ENCOUNTER — Telehealth: Payer: Self-pay | Admitting: Cardiovascular Disease

## 2016-02-25 ENCOUNTER — Encounter: Payer: Self-pay | Admitting: Family Medicine

## 2016-02-25 VITALS — BP 162/84 | HR 69 | Temp 97.6°F | Ht 61.0 in | Wt 140.0 lb

## 2016-02-25 DIAGNOSIS — G8929 Other chronic pain: Secondary | ICD-10-CM

## 2016-02-25 DIAGNOSIS — I5042 Chronic combined systolic (congestive) and diastolic (congestive) heart failure: Secondary | ICD-10-CM

## 2016-02-25 DIAGNOSIS — N183 Chronic kidney disease, stage 3 (moderate): Secondary | ICD-10-CM | POA: Diagnosis not present

## 2016-02-25 DIAGNOSIS — M79662 Pain in left lower leg: Secondary | ICD-10-CM | POA: Diagnosis not present

## 2016-02-25 DIAGNOSIS — I2510 Atherosclerotic heart disease of native coronary artery without angina pectoris: Secondary | ICD-10-CM

## 2016-02-25 DIAGNOSIS — M150 Primary generalized (osteo)arthritis: Secondary | ICD-10-CM | POA: Diagnosis not present

## 2016-02-25 DIAGNOSIS — I1310 Hypertensive heart and chronic kidney disease without heart failure, with stage 1 through stage 4 chronic kidney disease, or unspecified chronic kidney disease: Secondary | ICD-10-CM | POA: Diagnosis not present

## 2016-02-25 DIAGNOSIS — I255 Ischemic cardiomyopathy: Secondary | ICD-10-CM | POA: Diagnosis not present

## 2016-02-25 DIAGNOSIS — M545 Low back pain, unspecified: Secondary | ICD-10-CM

## 2016-02-25 DIAGNOSIS — I10 Essential (primary) hypertension: Secondary | ICD-10-CM | POA: Diagnosis not present

## 2016-02-25 DIAGNOSIS — E785 Hyperlipidemia, unspecified: Secondary | ICD-10-CM

## 2016-02-25 DIAGNOSIS — J449 Chronic obstructive pulmonary disease, unspecified: Secondary | ICD-10-CM | POA: Diagnosis not present

## 2016-02-25 DIAGNOSIS — M549 Dorsalgia, unspecified: Secondary | ICD-10-CM | POA: Diagnosis not present

## 2016-02-25 MED ORDER — AMLODIPINE BESYLATE 2.5 MG PO TABS: 2.5000 mg | ORAL_TABLET | Freq: Every day | ORAL | Status: DC

## 2016-02-25 MED ORDER — SERTRALINE HCL 100 MG PO TABS: 100.0000 mg | ORAL_TABLET | Freq: Every day | ORAL | Status: DC

## 2016-02-25 MED ORDER — PANTOPRAZOLE SODIUM 40 MG PO TBEC: 40.0000 mg | DELAYED_RELEASE_TABLET | Freq: Every day | ORAL | Status: DC

## 2016-02-25 MED ORDER — LISINOPRIL 20 MG PO TABS: 20.0000 mg | ORAL_TABLET | Freq: Every day | ORAL | Status: DC

## 2016-02-25 MED ORDER — GABAPENTIN 100 MG PO CAPS
100.0000 mg | ORAL_CAPSULE | Freq: Three times a day (TID) | ORAL | Status: DC
Start: 2016-02-25 — End: 2016-06-01

## 2016-02-25 MED ORDER — CARVEDILOL 6.25 MG PO TABS: 6.2500 mg | ORAL_TABLET | Freq: Two times a day (BID) | ORAL | Status: DC

## 2016-02-25 MED ORDER — CLOPIDOGREL BISULFATE 75 MG PO TABS: 75.0000 mg | ORAL_TABLET | Freq: Every day | ORAL | Status: DC

## 2016-02-25 NOTE — Patient Instructions (Signed)
Call with concerns.  Follow up in 6 months.  Take care  Dr. Lacinda Axon  Have fun on your trip.

## 2016-02-25 NOTE — Telephone Encounter (Signed)
Katherine [name] called to see if they could get an additional 2 weeks of physical therapy. I could not determine where the original order was for this therapy and per her reports it was a renewal to an order from Dr. Fletcher Anon. Let her know that Dr. Fletcher Anon is out of the office today but that I would forward to his nurse for review tomorrow. She verbalized understanding and had no further questions at this time.

## 2016-02-25 NOTE — Telephone Encounter (Signed)
[facility] calling asking about therapy patient is doing.  Would like to request further treatment  Would like for PT to go on for another 2 weeks.  Please advise

## 2016-02-25 NOTE — Progress Notes (Signed)
Pre visit review using our clinic review tool, if applicable. No additional management support is needed unless otherwise documented below in the visit note. 

## 2016-02-25 NOTE — Telephone Encounter (Signed)
Left voicemail message for Katherine [name] to call back.

## 2016-02-26 ENCOUNTER — Encounter: Payer: Self-pay | Admitting: Family Medicine

## 2016-02-26 DIAGNOSIS — M549 Dorsalgia, unspecified: Secondary | ICD-10-CM | POA: Diagnosis not present

## 2016-02-26 DIAGNOSIS — J449 Chronic obstructive pulmonary disease, unspecified: Secondary | ICD-10-CM | POA: Diagnosis not present

## 2016-02-26 DIAGNOSIS — M79662 Pain in left lower leg: Secondary | ICD-10-CM | POA: Diagnosis not present

## 2016-02-26 DIAGNOSIS — N183 Chronic kidney disease, stage 3 (moderate): Secondary | ICD-10-CM | POA: Diagnosis not present

## 2016-02-26 DIAGNOSIS — I2510 Atherosclerotic heart disease of native coronary artery without angina pectoris: Secondary | ICD-10-CM | POA: Diagnosis not present

## 2016-02-26 DIAGNOSIS — I1310 Hypertensive heart and chronic kidney disease without heart failure, with stage 1 through stage 4 chronic kidney disease, or unspecified chronic kidney disease: Secondary | ICD-10-CM | POA: Diagnosis not present

## 2016-02-26 DIAGNOSIS — M150 Primary generalized (osteo)arthritis: Secondary | ICD-10-CM | POA: Diagnosis not present

## 2016-02-26 DIAGNOSIS — E785 Hyperlipidemia, unspecified: Secondary | ICD-10-CM | POA: Diagnosis not present

## 2016-02-26 DIAGNOSIS — I255 Ischemic cardiomyopathy: Secondary | ICD-10-CM | POA: Diagnosis not present

## 2016-02-26 DIAGNOSIS — I5042 Chronic combined systolic (congestive) and diastolic (congestive) heart failure: Secondary | ICD-10-CM | POA: Diagnosis not present

## 2016-02-26 NOTE — Telephone Encounter (Signed)
Ok to renew?  

## 2016-02-26 NOTE — Progress Notes (Signed)
Subjective:  Patient ID: Katherine [name], female    DOB: Jan 06, 1930  Age: 82 y.o. MRN: [id number]  CC: Establish care  HPI Katherine [name] is a 82 y.o. female presents to the clinic today to establish care. Concerns/issues below.  CAD  Occlusive disease; see cath results below.  Stable currently.  No chest pain or SOB.  Being managed medically.  On Lipitor, Coreg, Lisinopril, Aspirin/plavix.  CHF  Combined. EF 35-40%.  On Coreg, Lasix, ACEI.  Doing well.  No SOB, weight gain, LE edema.  HTN  Has been stable.  On treatment above.  No reported side effects.  HLD  Well controlled on Lipitor 80 mg daily.  PMH, Surgical Hx, Family Hx, Social History reviewed and updated as below.  Past Medical History  Diagnosis Date  . Chronic combined systolic and diastolic heart failure (HCC)     EF 35-40%, Akinesis of mid-apicalanteroseptal wall & AK of apex, Gr 1DD.  Marland Kitchen Hypertensive heart disease   . COPD (chronic obstructive pulmonary disease) ([place])     worked in a [employer]  . Ischemic cardiomyopathy   . Occlusive coronary artery disease requiring drug therapy     a. Remote MI - never sought treatment when it occurred, shows up old on EKG;  b. Cath 10/2015 - 100% pLAD CTO with 80% D1, prox Cx 60% - EF ~35%  . Hypertension   . Hyperlipidemia   . Chicken pox   . Arthritis   . GERD (gastroesophageal reflux disease)    Past Surgical History  Procedure Laterality Date  . Shoulder surgery    . Cardiac catheterization N/A 10/30/2015    Procedure: Right/Left Heart Cath and Coronary Angiography;  Surgeon: Wellington Hampshire, MD;  Location: [facility];  Service: Cardiovascular;  Laterality: N/A;  . Cardiac catheterization N/A 10/30/2015    Procedure: Intravascular Pressure Wire/FFR Study;  Surgeon: Wellington Hampshire, MD;  Location: [facility];  Service: Cardiovascular;  Laterality: N/A;  . Total hip arthroplasty Right 2012  . Back surgery  2010    lower back  .  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 Appendectomy  1961  . Tonsillectomy  1953  . Abdominal hysterectomy  1985   Family History  Problem Relation Age of Onset  . Heart disease Mother   . Stroke Mother   . Hypertension Mother   . Lung cancer Mother   . Hyperlipidemia Mother   . CAD Sister   . Heart disease Sister   . Stroke Sister   . Hypertension Sister   . Arthritis Sister   . CAD Brother   . Heart disease Brother   . Hypertension Brother   . Heart disease Sister   . Stroke Sister   . Hypertension Sister   . Arthritis Sister   . Heart disease Sister   . Stroke Sister   . Hypertension Sister   . Breast cancer Sister   . Heart disease Sister   . Stroke Sister   . Hypertension Sister   . Heart disease Brother   . Hypertension Brother   . Heart disease Brother   . Hypertension Brother   . Heart disease Brother   . Hypertension Brother   . Heart disease Brother   . Hypertension Brother   . Heart disease Brother   . Hypertension Brother    Social History  Substance Use Topics  . Smoking status: Never Smoker   . Smokeless tobacco: Never  Used  . Alcohol Use: No   Review of Systems  Musculoskeletal: Positive for back pain.       Leg pain.  All other systems reviewed and are negative.  Objective:   Today's Vitals: BP 162/84 mmHg  Pulse 69  Temp(Src) 97.6 F (36.4 C) (Oral)  Ht 5\' 1"  (1.549 m)  Wt 140 lb (63.504 kg)  BMI 26.47 kg/m2  SpO2 98%  Physical Exam  Constitutional: She appears well-developed. No distress.  HENT:  Head: Normocephalic and atraumatic.  Mouth/Throat: Oropharynx is clear and moist.  Eyes: Conjunctivae are normal.  Neck: Neck supple.  Cardiovascular: Normal rate.   Soft systolic murmur.  Pulmonary/Chest: Effort normal. She has no wheezes. She has no rales.  Abdominal: Soft. She exhibits no distension. There is no tenderness. There is no rebound and no guarding.  Musculoskeletal: She exhibits no edema.  Trouble with gait.    Neurological: She is alert.  Skin: Skin is warm and dry. No rash noted.  Psychiatric: She has a normal mood and affect.  Vitals reviewed.  Assessment & Plan:   Problem List Items Addressed This Visit    Chronic combined systolic and diastolic heart failure (HCC) (Chronic)    Stable. Euvolemic today. Continue current medical regimen.      Relevant Medications   amLODipine (NORVASC) 2.5 MG tablet   carvedilol (COREG) 6.25 MG tablet   lisinopril (PRINIVIL,ZESTRIL) 20 MG tablet   Occlusive coronary artery disease requiring drug therapy - Primary (Chronic)    Stable currently. No current issues. Compliant with ASA, plavix, statin, ACEI & Beta blocker.      Relevant Medications   amLODipine (NORVASC) 2.5 MG tablet   carvedilol (COREG) 6.25 MG tablet   lisinopril (PRINIVIL,ZESTRIL) 20 MG tablet   Hyperlipidemia    Stable/well controlled. Continue Lipitor 80 mg daily.      Relevant Medications   amLODipine (NORVASC) 2.5 MG tablet   carvedilol (COREG) 6.25 MG tablet   lisinopril (PRINIVIL,ZESTRIL) 20 MG tablet   Essential hypertension    Mildly elevated today. Will continue to monitor. No medication changes today.      Relevant Medications   amLODipine (NORVASC) 2.5 MG tablet   carvedilol (COREG) 6.25 MG tablet   lisinopril (PRINIVIL,ZESTRIL) 20 MG tablet   Chronic low back pain    Patient with chronic low back pain. Currently on as needed narcotics. Followed by pain management.         Outpatient Encounter Prescriptions as of 02/25/2016  Medication Sig  . amLODipine (NORVASC) 2.5 MG tablet Take 1 tablet (2.5 mg total) by mouth daily.  Marland Kitchen aspirin EC 81 MG tablet Take 81 mg by mouth daily.  Marland Kitchen atorvastatin (LIPITOR) 80 MG tablet Take 1 tablet (80 mg total) by mouth daily at 6 PM.  . carvedilol (COREG) 6.25 MG tablet Take 1 tablet (6.25 mg total) by mouth 2 (two) times daily with a meal.  . clopidogrel (PLAVIX) 75 MG tablet Take 1 tablet (75 mg total) by mouth daily.    . furosemide (LASIX) 20 MG tablet Take 1 tablet (20 mg total) by mouth every other day.  . gabapentin (NEURONTIN) 100 MG capsule Take 1 capsule (100 mg total) by mouth 3 (three) times daily. Take two capsules three times daily.  Marland Kitchen HYDROcodone-acetaminophen (NORCO/VICODIN) 5-325 MG tablet Take 1 tablet by mouth every 4 (four) hours as needed for moderate pain.  Marland Kitchen lisinopril (PRINIVIL,ZESTRIL) 20 MG tablet Take 1 tablet (20 mg total) by mouth daily.  . nitroGLYCERIN (  NITROSTAT) 0.4 MG SL tablet Place 1 tablet (0.4 mg total) under the tongue every 5 (five) minutes as needed for chest pain.  Marland Kitchen omega-3 acid ethyl esters (LOVAZA) 1 g capsule Take 1 g by mouth daily.  . pantoprazole (PROTONIX) 40 MG tablet Take 1 tablet (40 mg total) by mouth daily.  . sertraline (ZOLOFT) 100 MG tablet Take 1 tablet (100 mg total) by mouth daily.  . [DISCONTINUED] amLODipine (NORVASC) 2.5 MG tablet Take 1 tablet (2.5 mg total) by mouth daily.  . [DISCONTINUED] carvedilol (COREG) 6.25 MG tablet Take 1 tablet (6.25 mg total) by mouth 2 (two) times daily with a meal.  . [DISCONTINUED] clopidogrel (PLAVIX) 75 MG tablet Take 1 tablet (75 mg total) by mouth daily.  . [DISCONTINUED] clopidogrel (PLAVIX) 75 MG tablet Take 1 tablet (75 mg total) by mouth daily.  . [DISCONTINUED] gabapentin (NEURONTIN) 100 MG capsule Take 100 mg by mouth 3 (three) times daily. Take two capsules three times daily.  . [DISCONTINUED] lisinopril (PRINIVIL,ZESTRIL) 20 MG tablet Take 1 tablet (20 mg total) by mouth daily.  . [DISCONTINUED] pantoprazole (PROTONIX) 40 MG tablet Take 1 tablet (40 mg total) by mouth daily.  . [DISCONTINUED] pantoprazole (PROTONIX) 40 MG tablet Take 1 tablet (40 mg total) by mouth daily.  . [DISCONTINUED] sertraline (ZOLOFT) 100 MG tablet Take 100 mg by mouth daily.  . [DISCONTINUED] baclofen (LIORESAL) 10 MG tablet Take 1 tablet (10 mg total) by mouth 3 (three) times daily. (Patient not taking: Reported on 02/25/2016)  .  [DISCONTINUED] lidocaine (LIDODERM) 5 % Place 1 patch onto the skin daily. Remove & Discard patch within 12 hours or as directed by MD (Patient not taking: Reported on 02/25/2016)  . [DISCONTINUED] oxyCODONE (OXY IR/ROXICODONE) 5 MG immediate release tablet Take 1 tablet (5 mg total) by mouth every 8 (eight) hours as needed for severe pain. (Patient not taking: Reported on 02/25/2016)  . [DISCONTINUED] predniSONE (DELTASONE) 10 MG tablet Take 10-60 mg by mouth daily with breakfast. Reported on 02/25/2016   Facility-Administered Encounter Medications as of 02/25/2016  Medication  . iopamidol (ISOVUE-M) 41 % intrathecal injection 20 mL  . lactated ringers infusion 1,000 mL  . lidocaine (PF) (XYLOCAINE) 1 % injection 5 mL  . midazolam (VERSED) injection 5 mg  . ropivacaine (PF) 2 mg/ml (0.2%) (NAROPIN) epidural 10 mL  . sodium chloride flush (NS) 0.9 % injection 10 mL  . triamcinolone acetonide (KENALOG-40) injection 40 mg    Follow-up: 6 months.  Rafael Hernandez

## 2016-02-26 NOTE — Assessment & Plan Note (Signed)
Mildly elevated today. Will continue to monitor. No medication changes today.

## 2016-02-26 NOTE — Assessment & Plan Note (Signed)
Stable. Euvolemic today. Continue current medical regimen.

## 2016-02-26 NOTE — Telephone Encounter (Signed)
S/w Marcelle Smiling who verbalized understanding to continue PT x 2 weeks per Dr. Fletcher Anon.

## 2016-02-26 NOTE — Assessment & Plan Note (Signed)
Patient with chronic low back pain. Currently on as needed narcotics. Followed by pain management.

## 2016-02-26 NOTE — Assessment & Plan Note (Signed)
Stable/well controlled. Continue Lipitor 80 mg daily.

## 2016-02-26 NOTE — Assessment & Plan Note (Signed)
Stable currently. No current issues. Compliant with ASA, plavix, statin, ACEI & Beta blocker.

## 2016-03-03 ENCOUNTER — Telehealth: Payer: Self-pay | Admitting: *Deleted

## 2016-03-03 NOTE — Telephone Encounter (Signed)
Ref # [id number] [employer] [facility] has requested to have a verification on the Rx for Neurontin

## 2016-03-03 NOTE — Telephone Encounter (Signed)
Can you advise which instructions to follow, the two or three times a day? Thanks

## 2016-03-03 NOTE — Telephone Encounter (Signed)
Verified with Evelena Peat  From [facility] home delivery the dosage for the medication. thanks

## 2016-03-03 NOTE — Telephone Encounter (Signed)
It appears 2 capsules TID.

## 2016-03-08 DIAGNOSIS — M549 Dorsalgia, unspecified: Secondary | ICD-10-CM | POA: Diagnosis not present

## 2016-03-08 DIAGNOSIS — M79662 Pain in left lower leg: Secondary | ICD-10-CM | POA: Diagnosis not present

## 2016-03-08 DIAGNOSIS — N183 Chronic kidney disease, stage 3 (moderate): Secondary | ICD-10-CM | POA: Diagnosis not present

## 2016-03-08 DIAGNOSIS — I2510 Atherosclerotic heart disease of native coronary artery without angina pectoris: Secondary | ICD-10-CM | POA: Diagnosis not present

## 2016-03-08 DIAGNOSIS — J449 Chronic obstructive pulmonary disease, unspecified: Secondary | ICD-10-CM | POA: Diagnosis not present

## 2016-03-08 DIAGNOSIS — M150 Primary generalized (osteo)arthritis: Secondary | ICD-10-CM | POA: Diagnosis not present

## 2016-03-08 DIAGNOSIS — I5042 Chronic combined systolic (congestive) and diastolic (congestive) heart failure: Secondary | ICD-10-CM | POA: Diagnosis not present

## 2016-03-08 DIAGNOSIS — I1310 Hypertensive heart and chronic kidney disease without heart failure, with stage 1 through stage 4 chronic kidney disease, or unspecified chronic kidney disease: Secondary | ICD-10-CM | POA: Diagnosis not present

## 2016-03-08 DIAGNOSIS — I255 Ischemic cardiomyopathy: Secondary | ICD-10-CM | POA: Diagnosis not present

## 2016-03-08 DIAGNOSIS — E785 Hyperlipidemia, unspecified: Secondary | ICD-10-CM | POA: Diagnosis not present

## 2016-03-10 ENCOUNTER — Telehealth: Payer: Self-pay | Admitting: Family Medicine

## 2016-03-10 DIAGNOSIS — I255 Ischemic cardiomyopathy: Secondary | ICD-10-CM | POA: Diagnosis not present

## 2016-03-10 DIAGNOSIS — M150 Primary generalized (osteo)arthritis: Secondary | ICD-10-CM | POA: Diagnosis not present

## 2016-03-10 DIAGNOSIS — M549 Dorsalgia, unspecified: Secondary | ICD-10-CM | POA: Diagnosis not present

## 2016-03-10 DIAGNOSIS — E785 Hyperlipidemia, unspecified: Secondary | ICD-10-CM | POA: Diagnosis not present

## 2016-03-10 DIAGNOSIS — J449 Chronic obstructive pulmonary disease, unspecified: Secondary | ICD-10-CM | POA: Diagnosis not present

## 2016-03-10 DIAGNOSIS — I5042 Chronic combined systolic (congestive) and diastolic (congestive) heart failure: Secondary | ICD-10-CM | POA: Diagnosis not present

## 2016-03-10 DIAGNOSIS — I2510 Atherosclerotic heart disease of native coronary artery without angina pectoris: Secondary | ICD-10-CM | POA: Diagnosis not present

## 2016-03-10 DIAGNOSIS — M79662 Pain in left lower leg: Secondary | ICD-10-CM | POA: Diagnosis not present

## 2016-03-10 DIAGNOSIS — I1310 Hypertensive heart and chronic kidney disease without heart failure, with stage 1 through stage 4 chronic kidney disease, or unspecified chronic kidney disease: Secondary | ICD-10-CM | POA: Diagnosis not present

## 2016-03-10 DIAGNOSIS — N183 Chronic kidney disease, stage 3 (moderate): Secondary | ICD-10-CM | POA: Diagnosis not present

## 2016-03-11 ENCOUNTER — Ambulatory Visit (INDEPENDENT_AMBULATORY_CARE_PROVIDER_SITE_OTHER): Payer: Medicare HMO | Admitting: Family Medicine

## 2016-03-11 ENCOUNTER — Ambulatory Visit (INDEPENDENT_AMBULATORY_CARE_PROVIDER_SITE_OTHER): Payer: Medicare HMO

## 2016-03-11 ENCOUNTER — Encounter: Payer: Self-pay | Admitting: Family Medicine

## 2016-03-11 VITALS — BP 148/72 | HR 61 | Temp 97.8°F | Wt 141.1 lb

## 2016-03-11 DIAGNOSIS — R05 Cough: Secondary | ICD-10-CM

## 2016-03-11 DIAGNOSIS — R059 Cough, unspecified: Secondary | ICD-10-CM

## 2016-03-11 DIAGNOSIS — I509 Heart failure, unspecified: Secondary | ICD-10-CM | POA: Diagnosis not present

## 2016-03-11 MED ORDER — BACLOFEN 10 MG PO TABS: 10.0000 mg | ORAL_TABLET | Freq: Three times a day (TID) | ORAL | 0 refills | Status: DC

## 2016-03-11 NOTE — Patient Instructions (Signed)
We will call with your chest xray results.  Follow up closely with me  Take care  Dr. Lacinda Axon

## 2016-03-12 DIAGNOSIS — M150 Primary generalized (osteo)arthritis: Secondary | ICD-10-CM | POA: Diagnosis not present

## 2016-03-12 DIAGNOSIS — N183 Chronic kidney disease, stage 3 (moderate): Secondary | ICD-10-CM | POA: Diagnosis not present

## 2016-03-12 DIAGNOSIS — I5042 Chronic combined systolic (congestive) and diastolic (congestive) heart failure: Secondary | ICD-10-CM | POA: Diagnosis not present

## 2016-03-12 DIAGNOSIS — J449 Chronic obstructive pulmonary disease, unspecified: Secondary | ICD-10-CM | POA: Diagnosis not present

## 2016-03-12 DIAGNOSIS — I1310 Hypertensive heart and chronic kidney disease without heart failure, with stage 1 through stage 4 chronic kidney disease, or unspecified chronic kidney disease: Secondary | ICD-10-CM | POA: Diagnosis not present

## 2016-03-12 DIAGNOSIS — M549 Dorsalgia, unspecified: Secondary | ICD-10-CM | POA: Diagnosis not present

## 2016-03-12 DIAGNOSIS — R05 Cough: Secondary | ICD-10-CM | POA: Insufficient documentation

## 2016-03-12 DIAGNOSIS — I2510 Atherosclerotic heart disease of native coronary artery without angina pectoris: Secondary | ICD-10-CM | POA: Diagnosis not present

## 2016-03-12 DIAGNOSIS — R059 Cough, unspecified: Secondary | ICD-10-CM | POA: Insufficient documentation

## 2016-03-12 DIAGNOSIS — I255 Ischemic cardiomyopathy: Secondary | ICD-10-CM | POA: Diagnosis not present

## 2016-03-12 DIAGNOSIS — M79662 Pain in left lower leg: Secondary | ICD-10-CM | POA: Diagnosis not present

## 2016-03-12 DIAGNOSIS — E785 Hyperlipidemia, unspecified: Secondary | ICD-10-CM | POA: Diagnosis not present

## 2016-03-12 MED ORDER — PREDNISONE 50 MG PO TABS: ORAL_TABLET | ORAL | 0 refills | Status: DC

## 2016-03-12 NOTE — Assessment & Plan Note (Signed)
New acute problem. Chest x-ray obtained given comorbidities. Chest x-ray revealed interstitial prominence. No frank pulmonary edema. No acute infiltrate. Patient euvolemic on exam today. Does not appear to be in heart failure. Treating with prednisone.

## 2016-03-12 NOTE — Progress Notes (Signed)
Subjective:  Patient ID: Katherine [name], female    DOB: 1929-10-24  Age: 82 y.o. MRN: [id number]  CC: Cough   HPI:  82 year old female with a comp located past medical history including occlusive coronary artery disease, heart failure, COPD, pulmonary hypertension presents with complaints of cough.  Cough  Patient reports she's had a cough for the past 3 days.  Cough is dry.  No associated fever or chills.  She states that it's worse in the morning as well as evening.  No reports of shortness of breath.  No known exacerbating or relieving factors.  No recent weight gain.  No other complaints at this time  Social Hx   Social History   Social History  . Marital status: Single    Spouse name: N/A  . Number of children: N/A  . Years of education: N/A   Social History Main Topics  . Smoking status: Never Smoker  . Smokeless tobacco: Never Used  . Alcohol use No  . Drug use: No  . Sexual activity: No   Other Topics Concern  . None   Social History Narrative  . None   Review of Systems  Constitutional: Negative.   Respiratory: Positive for cough.    Objective:  BP (!) 148/72 (BP Location: Left Arm, Patient Position: Sitting, Cuff Size: Normal)   Pulse 61   Temp 97.8 F (36.6 C) (Oral)   Wt 141 lb 2 oz (64 kg)   SpO2 98%   BMI 26.67 kg/m   BP/Weight 03/11/2016 02/25/2016 [id number]  Systolic BP 123456 0000000 0000000  Diastolic BP 72 84 88  Wt. (Lbs) 141.13 140 136  BMI 26.67 26.47 25.71   Physical Exam  Constitutional: She appears well-developed. No distress.  Cardiovascular: Normal rate and regular rhythm.   2/6 systolic murmur.  Pulmonary/Chest: Effort normal. She has no wheezes. She has no rales.  Neurological: She is alert.  Psychiatric: She has a normal mood and affect.  Vitals reviewed.  Lab Results  Component Value Date   WBC 7.7 01/08/2016   HGB 12.8 01/08/2016   HCT 38.4 01/08/2016   PLT 192 01/08/2016   GLUCOSE 145 (H) 01/08/2016   CHOL 114  12/16/2015   TRIG 126 12/16/2015   HDL 41 12/16/2015   LDLCALC 48 12/16/2015   ALT 25 01/07/2016   AST 36 01/07/2016   NA 141 01/08/2016   K 3.9 01/08/2016   CL 109 01/08/2016   CREATININE 0.92 01/08/2016   BUN 21 (H) 01/08/2016   CO2 26 01/08/2016   TSH 0.345 (L) 10/30/2015   INR 1.12 10/30/2015   HGBA1C 6.0 (H) 10/30/2015   Dg Chest 2 View  Result Date: 03/12/2016 CLINICAL DATA:  Cough.  CHF. EXAM: CHEST  2 VIEW COMPARISON:  No prior . FINDINGS: Mediastinum and hilar structures normal. Cardiomegaly. Bilateral pulmonary interstitial prominence noted. Mild interstitial edema or other active interstitial process including pneumonitis cannot be excluded. No pleural effusion or pneumothorax. IMPRESSION: Cardiomegaly with mild bilateral from interstitial prominence. Mild interstitial edema cannot be excluded. Other active interstitial processes such as pneumonitis cannot be excluded . Electronically Signed   By: Marcello Moores  Register   On: 03/12/2016 07:36   Assessment & Plan:   Problem List Items Addressed This Visit    Cough - Primary    New acute problem. Chest x-ray obtained given comorbidities. Chest x-ray revealed interstitial prominence. No frank pulmonary edema. No acute infiltrate. Patient euvolemic on exam today. Does not appear to be in heart  failure. Treating with prednisone.      Relevant Orders   DG Chest 2 View (Completed)    Other Visit Diagnoses   None.     Meds ordered this encounter  Medications  . baclofen (LIORESAL) 10 MG tablet    Sig: Take 1 tablet (10 mg total) by mouth 3 (three) times daily.    Dispense:  90 each    Refill:  0  . predniSONE (DELTASONE) 50 MG tablet    Sig: 1 tablet daily x 5 days.    Dispense:  5 tablet    Refill:  0   Follow-up: PRN  [place]

## 2016-03-16 DIAGNOSIS — I5042 Chronic combined systolic (congestive) and diastolic (congestive) heart failure: Secondary | ICD-10-CM | POA: Diagnosis not present

## 2016-03-16 DIAGNOSIS — N183 Chronic kidney disease, stage 3 (moderate): Secondary | ICD-10-CM | POA: Diagnosis not present

## 2016-03-16 DIAGNOSIS — M150 Primary generalized (osteo)arthritis: Secondary | ICD-10-CM | POA: Diagnosis not present

## 2016-03-16 DIAGNOSIS — I1310 Hypertensive heart and chronic kidney disease without heart failure, with stage 1 through stage 4 chronic kidney disease, or unspecified chronic kidney disease: Secondary | ICD-10-CM | POA: Diagnosis not present

## 2016-03-16 DIAGNOSIS — M79662 Pain in left lower leg: Secondary | ICD-10-CM | POA: Diagnosis not present

## 2016-03-16 DIAGNOSIS — M549 Dorsalgia, unspecified: Secondary | ICD-10-CM | POA: Diagnosis not present

## 2016-03-16 DIAGNOSIS — E785 Hyperlipidemia, unspecified: Secondary | ICD-10-CM | POA: Diagnosis not present

## 2016-03-16 DIAGNOSIS — J449 Chronic obstructive pulmonary disease, unspecified: Secondary | ICD-10-CM | POA: Diagnosis not present

## 2016-03-16 DIAGNOSIS — I255 Ischemic cardiomyopathy: Secondary | ICD-10-CM | POA: Diagnosis not present

## 2016-03-16 DIAGNOSIS — I2510 Atherosclerotic heart disease of native coronary artery without angina pectoris: Secondary | ICD-10-CM | POA: Diagnosis not present

## 2016-03-22 ENCOUNTER — Encounter (HOSPITAL_COMMUNITY): Payer: Self-pay

## 2016-03-22 ENCOUNTER — Emergency Department (HOSPITAL_COMMUNITY)
Admission: EM | Admit: 2016-03-22 | Discharge: 2016-03-22 | Disposition: A | Discharge status: Home / Self Care | Payer: Medicare HMO | Attending: Emergency Medicine | Admitting: Emergency Medicine

## 2016-03-22 ENCOUNTER — Emergency Department (HOSPITAL_COMMUNITY): Payer: Medicare HMO

## 2016-03-22 DIAGNOSIS — J449 Chronic obstructive pulmonary disease, unspecified: Secondary | ICD-10-CM | POA: Insufficient documentation

## 2016-03-22 DIAGNOSIS — W182XXA Fall in (into) shower or empty bathtub, initial encounter: Secondary | ICD-10-CM | POA: Diagnosis not present

## 2016-03-22 DIAGNOSIS — Z96641 Presence of right artificial hip joint: Secondary | ICD-10-CM | POA: Diagnosis not present

## 2016-03-22 DIAGNOSIS — M542 Cervicalgia: Secondary | ICD-10-CM | POA: Diagnosis not present

## 2016-03-22 DIAGNOSIS — S098XXA Other specified injuries of head, initial encounter: Secondary | ICD-10-CM | POA: Diagnosis not present

## 2016-03-22 DIAGNOSIS — S0003XA Contusion of scalp, initial encounter: Secondary | ICD-10-CM | POA: Diagnosis not present

## 2016-03-22 DIAGNOSIS — S0190XA Unspecified open wound of unspecified part of head, initial encounter: Secondary | ICD-10-CM | POA: Diagnosis not present

## 2016-03-22 DIAGNOSIS — I5042 Chronic combined systolic (congestive) and diastolic (congestive) heart failure: Secondary | ICD-10-CM | POA: Insufficient documentation

## 2016-03-22 DIAGNOSIS — Y999 Unspecified external cause status: Secondary | ICD-10-CM | POA: Diagnosis not present

## 2016-03-22 DIAGNOSIS — I110 Hypertensive heart disease with heart failure: Secondary | ICD-10-CM | POA: Diagnosis not present

## 2016-03-22 DIAGNOSIS — Y93E1 Activity, personal bathing and showering: Secondary | ICD-10-CM | POA: Insufficient documentation

## 2016-03-22 DIAGNOSIS — W19XXXA Unspecified fall, initial encounter: Secondary | ICD-10-CM

## 2016-03-22 DIAGNOSIS — S299XXA Unspecified injury of thorax, initial encounter: Secondary | ICD-10-CM | POA: Diagnosis not present

## 2016-03-22 DIAGNOSIS — Z79899 Other long term (current) drug therapy: Secondary | ICD-10-CM | POA: Insufficient documentation

## 2016-03-22 DIAGNOSIS — S0990XA Unspecified injury of head, initial encounter: Secondary | ICD-10-CM

## 2016-03-22 DIAGNOSIS — Y92009 Unspecified place in unspecified non-institutional (private) residence as the place of occurrence of the external cause: Secondary | ICD-10-CM | POA: Insufficient documentation

## 2016-03-22 DIAGNOSIS — Z7982 Long term (current) use of aspirin: Secondary | ICD-10-CM | POA: Insufficient documentation

## 2016-03-22 DIAGNOSIS — S199XXA Unspecified injury of neck, initial encounter: Secondary | ICD-10-CM | POA: Diagnosis not present

## 2016-03-22 DIAGNOSIS — R51 Headache: Secondary | ICD-10-CM | POA: Diagnosis not present

## 2016-03-22 NOTE — Discharge Planning (Signed)
Pt currently active with [facility] for RN and PT services by has just recently been discharged from PT services.  Resumption of care requested. Manuela Schwartz of [facility] notified.  No DME needs identified at this time.

## 2016-03-22 NOTE — ED Notes (Signed)
Pt. Transported to CT at this time.  

## 2016-03-22 NOTE — ED Notes (Signed)
Pt niece at bedside. Pt stated niece is a Therapist, sports at [employer].

## 2016-03-22 NOTE — ED Notes (Signed)
EDP at bedside  

## 2016-03-22 NOTE — ED Triage Notes (Signed)
Patient fell at home in tub this morning. Patient has bruising to the back of her head and her lip. Patient takes 75mg  of plavix daily. Last dose of plavix was yesterday morning. Patient alert and oriented. Patient remained conscious.

## 2016-03-22 NOTE — ED Notes (Signed)
Pt able to ambulate to restroom with walker and staff assist. Pt moved slow with trouble using right foot. Rt foot dragged to side

## 2016-03-22 NOTE — ED Provider Notes (Signed)
[facility] DEPT Provider Note   CSN: [id number] Arrival date & time: 03/22/16  1036  First Provider Contact:  First MD Initiated Contact with Patient 03/22/16 1047        History   Chief Complaint Chief Complaint  Patient presents with  . Fall    on plavix    HPI Katherine [name] is a 82 y.o. female.  Patient presents after a fall. She states that her right-sided is chronically weaker than her left side. She was washing herself up and stepped on her right leg and states that it gave way on her. This is not uncommon for her. She fell backward and hit her head on the bathtub. There is no loss of consciousness. She is on Plavix. She denies any other injuries from the fall.      Past Medical History:  Diagnosis Date  . Arthritis   . Chicken pox   . Chronic combined systolic and diastolic heart failure (HCC)    EF 35-40%, Akinesis of mid-apicalanteroseptal wall & AK of apex, Gr 1DD.  Marland Kitchen COPD (chronic obstructive pulmonary disease) ([place])    worked in a [employer]  . GERD (gastroesophageal reflux disease)   . Hyperlipidemia   . Hypertension   . Hypertensive heart disease   . Ischemic cardiomyopathy   . Occlusive coronary artery disease requiring drug therapy    a. Remote MI - never sought treatment when it occurred, shows up old on EKG;  b. Cath 10/2015 - 100% pLAD CTO with 80% D1, prox Cx 60% - EF ~35%    Patient Active Problem List   Diagnosis Date Noted  . Cough 03/12/2016  . Chronic low back pain 01/08/2016  . Essential hypertension 12/02/2015  . Chronic combined systolic and diastolic heart failure ([place])   . Occlusive coronary artery disease requiring drug therapy   . Hyperlipidemia 11/02/2015  . Cardiomyopathy, ischemic 11/02/2015  . PAH (pulmonary artery hypertension) ([place]) 11/02/2015  . NSTEMI (non-ST elevated myocardial infarction) ([place]) - 10/30/2015 10/30/2015  . COPD (chronic obstructive pulmonary disease) ([place])     Past Surgical History:  Procedure Laterality  Date  . ABDOMINAL HYSTERECTOMY  1985  . AMPUTATION FINGER Left 1966   ring finger  . APPENDECTOMY  1961  . BACK SURGERY  2010   lower back  . CARDIAC CATHETERIZATION N/A 10/30/2015   Procedure: Right/Left Heart Cath and Coronary Angiography;  Surgeon: Wellington Hampshire, MD;  Location: [facility];  Service: Cardiovascular;  Laterality: N/A;  . CARDIAC CATHETERIZATION N/A 10/30/2015   Procedure: Intravascular Pressure Wire/FFR Study;  Surgeon: Wellington Hampshire, MD;  Location: [facility];  Service: Cardiovascular;  Laterality: N/A;  . SHOULDER SURGERY    . TONSILLECTOMY  1953  . TOTAL HIP ARTHROPLASTY Right 2012    OB History    No data available       Home Medications    Prior to Admission medications   Medication Sig Start Date End Date Taking? Authorizing Provider  amLODipine (NORVASC) 2.5 MG tablet Take 1 tablet (2.5 mg total) by mouth daily. 02/25/16   Coral Spikes, DO  aspirin EC 81 MG tablet Take 81 mg by mouth daily.    Historical Provider, MD  atorvastatin (LIPITOR) 80 MG tablet Take 1 tablet (80 mg total) by mouth daily at 6 PM. 11/14/15   Imogene Burn, PA-C  baclofen (LIORESAL) 10 MG tablet Take 1 tablet (10 mg total) by mouth 3 (three) times daily. 03/11/16  Coral Spikes, DO  carvedilol (COREG) 6.25 MG tablet Take 1 tablet (6.25 mg total) by mouth 2 (two) times daily with a meal. 02/25/16   Coral Spikes, DO  clopidogrel (PLAVIX) 75 MG tablet Take 1 tablet (75 mg total) by mouth daily. 02/25/16   Coral Spikes, DO  furosemide (LASIX) 20 MG tablet Take 1 tablet (20 mg total) by mouth every other day. 12/18/15   Wellington Hampshire, MD  gabapentin (NEURONTIN) 100 MG capsule Take 1 capsule (100 mg total) by mouth 3 (three) times daily. Take two capsules three times daily. 02/25/16   Coral Spikes, DO  HYDROcodone-acetaminophen (NORCO/VICODIN) 5-325 MG tablet Take 1 tablet by mouth every 4 (four) hours as needed for moderate pain. 02/23/16   Molli Barrows, MD  lisinopril  (PRINIVIL,ZESTRIL) 20 MG tablet Take 1 tablet (20 mg total) by mouth daily. 02/25/16   Coral Spikes, DO  nitroGLYCERIN (NITROSTAT) 0.4 MG SL tablet Place 1 tablet (0.4 mg total) under the tongue every 5 (five) minutes as needed for chest pain. 11/14/15   Imogene Burn, PA-C  omega-3 acid ethyl esters (LOVAZA) 1 g capsule Take 1 g by mouth daily.    Historical Provider, MD  pantoprazole (PROTONIX) 40 MG tablet Take 1 tablet (40 mg total) by mouth daily. 02/25/16   Coral Spikes, DO  predniSONE (DELTASONE) 50 MG tablet 1 tablet daily x 5 days. 03/12/16   Coral Spikes, DO  sertraline (ZOLOFT) 100 MG tablet Take 1 tablet (100 mg total) by mouth daily. 02/25/16   Coral Spikes, DO    Family History Family History  Problem Relation Age of Onset  . Heart disease Mother   . Stroke Mother   . Hypertension Mother   . Lung cancer Mother   . Hyperlipidemia Mother   . CAD Sister   . Heart disease Sister   . Stroke Sister   . Hypertension Sister   . Arthritis Sister   . CAD Brother   . Heart disease Brother   . Hypertension Brother   . Heart disease Sister   . Stroke Sister   . Hypertension Sister   . Arthritis Sister   . Heart disease Sister   . Stroke Sister   . Hypertension Sister   . Breast cancer Sister   . Heart disease Sister   . Stroke Sister   . Hypertension Sister   . Heart disease Brother   . Hypertension Brother   . Heart disease Brother   . Hypertension Brother   . Heart disease Brother   . Hypertension Brother   . Heart disease Brother   . Hypertension Brother   . Heart disease Brother   . Hypertension Brother     Social History Social History  Substance Use Topics  . Smoking status: Never Smoker  . Smokeless tobacco: Never Used  . Alcohol use No     Allergies   Review of patient's allergies indicates no known allergies.   Review of Systems Review of Systems  Constitutional: Negative for chills, diaphoresis, fatigue and fever.  HENT: Negative for congestion,  rhinorrhea and sneezing.   Eyes: Negative.   Respiratory: Negative for cough, chest tightness and shortness of breath.   Cardiovascular: Negative for chest pain and leg swelling.  Gastrointestinal: Negative for abdominal pain, blood in stool, diarrhea, nausea and vomiting.  Genitourinary: Negative for difficulty urinating, flank pain, frequency and hematuria.  Musculoskeletal: Negative for arthralgias, back pain and neck pain.  Skin: Negative for rash and wound.  Neurological: Negative for dizziness, speech difficulty, weakness, numbness and headaches.     Physical Exam Updated Vital Signs BP (!) 144/54 (BP Location: Right Arm)   Pulse 63   Temp 98.2 F (36.8 C) (Oral)   Resp 16   Ht 5\' 1"  (1.549 m)   Wt 140 lb (63.5 kg)   SpO2 95%   BMI 26.45 kg/m   Physical Exam  Constitutional: She is oriented to person, place, and time. She appears well-developed and well-nourished.  HENT:  Head: Normocephalic.  Small hematoma to the posterior scalp  Eyes: Pupils are equal, round, and reactive to light.  Neck: Normal range of motion. Neck supple.  No pain to the cervical thoracic or lumbosacral spine  Cardiovascular: Normal rate, regular rhythm and normal heart sounds.   Pulmonary/Chest: Effort normal and breath sounds normal. No respiratory distress. She has no wheezes. She has no rales. She exhibits no tenderness.  Abdominal: Soft. Bowel sounds are normal. There is no tenderness. There is no rebound and no guarding.  Musculoskeletal: Normal range of motion. She exhibits no edema.  No pain on palpation or range of motion of the extremities  Lymphadenopathy:    She has no cervical adenopathy.  Neurological: She is alert and oriented to person, place, and time.  Skin: Skin is warm and dry. No rash noted.  Psychiatric: She has a normal mood and affect.     ED Treatments / Results  Labs (all labs ordered are listed, but only abnormal results are displayed) Labs Reviewed - No data to  display  EKG  EKG Interpretation None       Radiology Ct Head Wo Contrast  Result Date: 03/22/2016 CLINICAL DATA:  Slipped and fell in the shower this morning struck back of head with posterior scalp hematoma. Headache and neck pain. EXAM: CT HEAD WITHOUT CONTRAST CT CERVICAL SPINE WITHOUT CONTRAST TECHNIQUE: Multidetector CT imaging of the head and cervical spine was performed following the standard protocol without intravenous contrast. Multiplanar CT image reconstructions of the cervical spine were also generated. COMPARISON:  None. FINDINGS: CT HEAD FINDINGS There is no evidence for acute hemorrhage, hydrocephalus, mass lesion, or abnormal extra-axial fluid collection. No definite CT evidence for acute infarction. Diffuse loss of parenchymal volume is consistent with atrophy. Patchy low attenuation in the deep hemispheric and periventricular white matter is nonspecific, but likely reflects chronic microvascular ischemic demyelination. Opacification of scattered ethmoid air cells noted without air-fluid levels in the paranasal sinuses. No evidence for fluid in the mastoid air cells or middle ears. No evidence for skull fracture CT CERVICAL SPINE FINDINGS Is imaging was obtained from the skullbase through the T2 vertebral body. No evidence of fracture. No subluxation. Marked loss of disc height is seen at C5-6 and C6-7. Left-sided facet osteoarthritis most advanced at C2-3 and C3-4. Reversal of the normal cervical lordosis is observed. No prevertebral soft tissue edema. IMPRESSION: 1. No acute intracranial abnormality. 2. Scattered chronic ethmoid sinus disease. 3. Degenerative changes in the cervical spine without acute fracture. Electronically Signed   By: Misty Stanley M.D.   On: 03/22/2016 12:40   Ct Cervical Spine Wo Contrast  Result Date: 03/22/2016 CLINICAL DATA:  Slipped and fell in the shower this morning struck back of head with posterior scalp hematoma. Headache and neck pain. EXAM: CT  HEAD WITHOUT CONTRAST CT CERVICAL SPINE WITHOUT CONTRAST TECHNIQUE: Multidetector CT imaging of the head and cervical spine was performed following the standard protocol  without intravenous contrast. Multiplanar CT image reconstructions of the cervical spine were also generated. COMPARISON:  None. FINDINGS: CT HEAD FINDINGS There is no evidence for acute hemorrhage, hydrocephalus, mass lesion, or abnormal extra-axial fluid collection. No definite CT evidence for acute infarction. Diffuse loss of parenchymal volume is consistent with atrophy. Patchy low attenuation in the deep hemispheric and periventricular white matter is nonspecific, but likely reflects chronic microvascular ischemic demyelination. Opacification of scattered ethmoid air cells noted without air-fluid levels in the paranasal sinuses. No evidence for fluid in the mastoid air cells or middle ears. No evidence for skull fracture CT CERVICAL SPINE FINDINGS Is imaging was obtained from the skullbase through the T2 vertebral body. No evidence of fracture. No subluxation. Marked loss of disc height is seen at C5-6 and C6-7. Left-sided facet osteoarthritis most advanced at C2-3 and C3-4. Reversal of the normal cervical lordosis is observed. No prevertebral soft tissue edema. IMPRESSION: 1. No acute intracranial abnormality. 2. Scattered chronic ethmoid sinus disease. 3. Degenerative changes in the cervical spine without acute fracture. Electronically Signed   By: Misty Stanley M.D.   On: 03/22/2016 12:40    Procedures Procedures (including critical care time)  Medications Ordered in ED Medications - No data to display   Initial Impression / Assessment and Plan / ED Course  I have reviewed the triage vital signs and the nursing notes.  Pertinent labs & imaging results that were available during my care of the patient were reviewed by me and considered in my medical decision making (see chart for details).  Clinical Course    Patient  presents after a fall. CT scans of her head and cervical spine are unremarkable. The daughter states that she's been falling more frequently.  She has chronic back pain, resulting in her leg weakness.  She's been evaluated by neurosurgery and had MRIs. Apparently she's not operative candidate due to her heart disease. Her daughter thinks that her leg has progressively gotten weaker. This is been resulting in more falls. She was doing physical therapy but that indicated a short while ago. The daughter feels like she would benefit from more physical therapy. I consulted case management and have ordered outpatient physical therapy. Patient will also follow-up with her primary care physician. It seems that this is more of a chronic worsening problem I don't feel that emergent MRI is indicated at this point.  Final Clinical Impressions(s) / ED Diagnoses   Final diagnoses:  Fall, initial encounter  Head injury, initial encounter    New Prescriptions Discharge Medication List as of 03/22/2016  2:18 PM       Malvin Johns, MD 03/22/16 1530

## 2016-03-23 DIAGNOSIS — M549 Dorsalgia, unspecified: Secondary | ICD-10-CM | POA: Diagnosis not present

## 2016-03-23 DIAGNOSIS — M150 Primary generalized (osteo)arthritis: Secondary | ICD-10-CM | POA: Diagnosis not present

## 2016-03-23 DIAGNOSIS — I1310 Hypertensive heart and chronic kidney disease without heart failure, with stage 1 through stage 4 chronic kidney disease, or unspecified chronic kidney disease: Secondary | ICD-10-CM | POA: Diagnosis not present

## 2016-03-23 DIAGNOSIS — N183 Chronic kidney disease, stage 3 (moderate): Secondary | ICD-10-CM | POA: Diagnosis not present

## 2016-03-23 DIAGNOSIS — M79662 Pain in left lower leg: Secondary | ICD-10-CM | POA: Diagnosis not present

## 2016-03-23 DIAGNOSIS — E785 Hyperlipidemia, unspecified: Secondary | ICD-10-CM | POA: Diagnosis not present

## 2016-03-23 DIAGNOSIS — I5042 Chronic combined systolic (congestive) and diastolic (congestive) heart failure: Secondary | ICD-10-CM | POA: Diagnosis not present

## 2016-03-23 DIAGNOSIS — I2510 Atherosclerotic heart disease of native coronary artery without angina pectoris: Secondary | ICD-10-CM | POA: Diagnosis not present

## 2016-03-23 DIAGNOSIS — J449 Chronic obstructive pulmonary disease, unspecified: Secondary | ICD-10-CM | POA: Diagnosis not present

## 2016-03-23 DIAGNOSIS — I255 Ischemic cardiomyopathy: Secondary | ICD-10-CM | POA: Diagnosis not present

## 2016-03-25 ENCOUNTER — Ambulatory Visit (INDEPENDENT_AMBULATORY_CARE_PROVIDER_SITE_OTHER): Payer: Medicare HMO | Admitting: Family Medicine

## 2016-03-25 ENCOUNTER — Encounter: Payer: Self-pay | Admitting: Family Medicine

## 2016-03-25 DIAGNOSIS — R296 Repeated falls: Secondary | ICD-10-CM

## 2016-03-25 NOTE — Progress Notes (Signed)
Subjective:  Patient ID: Katherine [name], female    DOB: 1930/02/01  Age: 82 y.o. MRN: [id number]  CC: ED follow up  HPI:  82 year old female with ischemic cardiac myopathy, CHF, chronic low back pain, COPD, hypertension, hyperlipidemia presents for ED follow-up.  Patient was seen in the ED on 8/7 after suffering a fall at home. Patient was in the bathroom and fell backwards and hit her head on the bathtub. She was seen and evaluated. CT scans of the head and neck were negative. She was discharged home with outpatient therapy.  Patient presents today for follow-up.   Patient states that she is doing fine. Family member does note that she's been falling more frequently for the past few months. She has had additional falls since her most recent ED visit. There have been changes at home to help prevent this. She now has a bedside commode and a different walker to help accommodate her to ensure she doesn't fall. Patient does have a complaint that her lower dentures don't fit and she is unable to wear them given her mouth injury from the fall. Otherwise she's doing well. Family requests a shower chair and a wheelchair today to help accommodate her in the home.  Social Hx   Social History   Social History  . Marital status: Single    Spouse name: N/A  . Number of children: N/A  . Years of education: N/A   Social History Main Topics  . Smoking status: Never Smoker  . Smokeless tobacco: Never Used  . Alcohol use No  . Drug use: No  . Sexual activity: No   Other Topics Concern  . None   Social History Narrative  . None   Review of Systems  Constitutional: Negative.   Musculoskeletal: Positive for back pain.  Neurological:       Frequent falls.    Objective:  BP 124/69 (BP Location: Left Arm, Patient Position: Sitting, Cuff Size: Normal)   Pulse (!) 56   Temp 98.3 F (36.8 C) (Oral)   Wt 139 lb 6 oz (63.2 kg)   SpO2 97%   BMI 26.33 kg/m   BP/Weight 03/25/2016 03/22/2016  [id number]  Systolic BP [id number]  Diastolic BP 69 54 72  Wt. (Lbs) 139.38 140 141.13  BMI 26.33 26.45 26.67   Physical Exam  Constitutional: She is oriented to person, place, and time. She appears well-developed. No distress.  HENT:  Mouth - low gum (R) with mild swelling. Small ulceration noted.  Cardiovascular: Normal rate and regular rhythm.   [id number] systolic murmur.  Pulmonary/Chest: Effort normal. She has no wheezes. She has no rales.  Neurological: She is alert and oriented to person, place, and time.  Skin:  Bruising noted of the R lower jaw.  Psychiatric: She has a normal mood and affect.  Vitals reviewed.  Lab Results  Component Value Date   WBC 7.7 01/08/2016   HGB 12.8 01/08/2016   HCT 38.4 01/08/2016   PLT 192 01/08/2016   GLUCOSE 145 (H) 01/08/2016   CHOL 114 12/16/2015   TRIG 126 12/16/2015   HDL 41 12/16/2015   LDLCALC 48 12/16/2015   ALT 25 01/07/2016   AST 36 01/07/2016   NA 141 01/08/2016   K 3.9 01/08/2016   CL 109 01/08/2016   CREATININE 0.92 01/08/2016   BUN 21 (H) 01/08/2016   CO2 26 01/08/2016   TSH 0.345 (L) 10/30/2015   INR 1.12 10/30/2015   HGBA1C 6.0 (H) 10/30/2015  Assessment & Plan:   Problem List Items Addressed This Visit    Frequent falls    New problem. Multifactorial - chronic low back with radiculopathy, deconditioning. Rx given (hand written Rx) for Shower chair and portable wheelchair. Offered Dexa scan. Patient declined at this time.       Other Visit Diagnoses   None.    Follow-up: PRN  [place]

## 2016-03-25 NOTE — Assessment & Plan Note (Signed)
New problem. Multifactorial - chronic low back with radiculopathy, deconditioning. Rx given (hand written Rx) for Shower chair and portable wheelchair. Offered Dexa scan. Patient declined at this time.

## 2016-03-25 NOTE — Progress Notes (Signed)
Pre visit review using our clinic review tool, if applicable. No additional management support is needed unless otherwise documented below in the visit note. 

## 2016-03-26 ENCOUNTER — Telehealth: Payer: Self-pay | Admitting: Anesthesiology

## 2016-03-26 NOTE — Telephone Encounter (Signed)
Patient wants to get refill called in for Vicodin 3 months, has appt 03-30-16 at 2:15 for procedure.

## 2016-03-29 ENCOUNTER — Telehealth: Payer: Self-pay | Admitting: Family Medicine

## 2016-03-29 NOTE — Telephone Encounter (Signed)
Received a form for a wheelchair which was filled out and faxed back.

## 2016-03-29 NOTE — Telephone Encounter (Signed)
Corene Cornea called from [facility] regarding a fax for a new Rx and a progress narrative note that needs to be signed and that will be faxed today. Fax to [phone]. Thank you!

## 2016-03-29 NOTE — Telephone Encounter (Signed)
Please advise if you don't receive this today, thanks

## 2016-03-30 ENCOUNTER — Encounter: Payer: Self-pay | Admitting: Anesthesiology

## 2016-03-30 ENCOUNTER — Ambulatory Visit: Payer: Medicare HMO | Attending: Anesthesiology | Admitting: Anesthesiology

## 2016-03-30 DIAGNOSIS — M5137 Other intervertebral disc degeneration, lumbosacral region: Secondary | ICD-10-CM | POA: Diagnosis not present

## 2016-03-30 DIAGNOSIS — I10 Essential (primary) hypertension: Secondary | ICD-10-CM | POA: Diagnosis not present

## 2016-03-30 DIAGNOSIS — Z8249 Family history of ischemic heart disease and other diseases of the circulatory system: Secondary | ICD-10-CM | POA: Insufficient documentation

## 2016-03-30 DIAGNOSIS — M48061 Spinal stenosis, lumbar region without neurogenic claudication: Secondary | ICD-10-CM

## 2016-03-30 DIAGNOSIS — N183 Chronic kidney disease, stage 3 (moderate): Secondary | ICD-10-CM | POA: Diagnosis not present

## 2016-03-30 DIAGNOSIS — I959 Hypotension, unspecified: Secondary | ICD-10-CM | POA: Diagnosis not present

## 2016-03-30 DIAGNOSIS — I110 Hypertensive heart disease with heart failure: Secondary | ICD-10-CM | POA: Diagnosis not present

## 2016-03-30 DIAGNOSIS — K219 Gastro-esophageal reflux disease without esophagitis: Secondary | ICD-10-CM | POA: Diagnosis not present

## 2016-03-30 DIAGNOSIS — M8580 Other specified disorders of bone density and structure, unspecified site: Secondary | ICD-10-CM | POA: Diagnosis not present

## 2016-03-30 DIAGNOSIS — I4891 Unspecified atrial fibrillation: Secondary | ICD-10-CM | POA: Insufficient documentation

## 2016-03-30 DIAGNOSIS — I255 Ischemic cardiomyopathy: Secondary | ICD-10-CM | POA: Insufficient documentation

## 2016-03-30 DIAGNOSIS — R296 Repeated falls: Secondary | ICD-10-CM | POA: Diagnosis not present

## 2016-03-30 DIAGNOSIS — M4806 Spinal stenosis, lumbar region: Secondary | ICD-10-CM | POA: Diagnosis not present

## 2016-03-30 DIAGNOSIS — M961 Postlaminectomy syndrome, not elsewhere classified: Secondary | ICD-10-CM

## 2016-03-30 DIAGNOSIS — N281 Cyst of kidney, acquired: Secondary | ICD-10-CM | POA: Diagnosis not present

## 2016-03-30 DIAGNOSIS — M1990 Unspecified osteoarthritis, unspecified site: Secondary | ICD-10-CM | POA: Insufficient documentation

## 2016-03-30 DIAGNOSIS — M5126 Other intervertebral disc displacement, lumbar region: Secondary | ICD-10-CM | POA: Diagnosis not present

## 2016-03-30 DIAGNOSIS — Z96641 Presence of right artificial hip joint: Secondary | ICD-10-CM | POA: Insufficient documentation

## 2016-03-30 DIAGNOSIS — I2510 Atherosclerotic heart disease of native coronary artery without angina pectoris: Secondary | ICD-10-CM | POA: Diagnosis not present

## 2016-03-30 DIAGNOSIS — J449 Chronic obstructive pulmonary disease, unspecified: Secondary | ICD-10-CM | POA: Diagnosis not present

## 2016-03-30 DIAGNOSIS — M545 Low back pain: Secondary | ICD-10-CM | POA: Diagnosis present

## 2016-03-30 DIAGNOSIS — Z7982 Long term (current) use of aspirin: Secondary | ICD-10-CM | POA: Diagnosis not present

## 2016-03-30 DIAGNOSIS — M5124 Other intervertebral disc displacement, thoracic region: Secondary | ICD-10-CM | POA: Insufficient documentation

## 2016-03-30 DIAGNOSIS — I5042 Chronic combined systolic (congestive) and diastolic (congestive) heart failure: Secondary | ICD-10-CM | POA: Diagnosis not present

## 2016-03-30 DIAGNOSIS — I252 Old myocardial infarction: Secondary | ICD-10-CM | POA: Diagnosis not present

## 2016-03-30 DIAGNOSIS — R269 Unspecified abnormalities of gait and mobility: Secondary | ICD-10-CM | POA: Diagnosis not present

## 2016-03-30 DIAGNOSIS — I429 Cardiomyopathy, unspecified: Secondary | ICD-10-CM | POA: Diagnosis not present

## 2016-03-30 DIAGNOSIS — E785 Hyperlipidemia, unspecified: Secondary | ICD-10-CM | POA: Diagnosis not present

## 2016-03-30 MED ORDER — HYDROCODONE-ACETAMINOPHEN 5-325 MG PO TABS: 1.0000 | ORAL_TABLET | ORAL | 0 refills | Status: DC | PRN

## 2016-03-30 NOTE — Patient Instructions (Signed)
GENERAL RISKS AND COMPLICATIONS  What are the risk, side effects and possible complications? Generally speaking, most procedures are safe.  However, with any procedure there are risks, side effects, and the possibility of complications.  The risks and complications are dependent upon the sites that are lesioned, or the type of nerve block to be performed.  The closer the procedure is to the spine, the more serious the risks are.  Great care is taken when placing the radio frequency needles, block needles or lesioning probes, but sometimes complications can occur. 1. Infection: Any time there is an injection through the skin, there is a risk of infection.  This is why sterile conditions are used for these blocks.  There are four possible types of infection. 1. Localized skin infection. 2. Central Nervous System Infection-This can be in the form of Meningitis, which can be deadly. 3. Epidural Infections-This can be in the form of an epidural abscess, which can cause pressure inside of the spine, causing compression of the spinal cord with subsequent paralysis. This would require an emergency surgery to decompress, and there are no guarantees that the patient would recover from the paralysis. 4. Discitis-This is an infection of the intervertebral discs.  It occurs in about 1% of discography procedures.  It is difficult to treat and it may lead to surgery.        2. Pain: the needles have to go through skin and soft tissues, will cause soreness.       3. Damage to internal structures:  The nerves to be lesioned may be near blood vessels or    other nerves which can be potentially damaged.       4. Bleeding: Bleeding is more common if the patient is taking blood thinners such as  aspirin, Coumadin, Ticiid, Plavix, etc., or if he/she have some genetic predisposition  such as hemophilia. Bleeding into the spinal canal can cause compression of the spinal  cord with subsequent paralysis.  This would require an  emergency surgery to  decompress and there are no guarantees that the patient would recover from the  paralysis.       5. Pneumothorax:  Puncturing of a lung is a possibility, every time a needle is introduced in  the area of the chest or upper back.  Pneumothorax refers to free air around the  collapsed lung(s), inside of the thoracic cavity (chest cavity).  Another two possible  complications related to a similar event would include: Hemothorax and Chylothorax.   These are variations of the Pneumothorax, where instead of air around the collapsed  lung(s), you may have blood or chyle, respectively.       6. Spinal headaches: They may occur with any procedures in the area of the spine.       7. Persistent CSF (Cerebro-Spinal Fluid) leakage: This is a rare problem, but may occur  with prolonged intrathecal or epidural catheters either due to the formation of a fistulous  track or a dural tear.       8. Nerve damage: By working so close to the spinal cord, there is always a possibility of  nerve damage, which could be as serious as a permanent spinal cord injury with  paralysis.       9. Death:  Although rare, severe deadly allergic reactions known as "Anaphylactic  reaction" can occur to any of the medications used.      10. Worsening of the symptoms:  We can always make thing worse.    What are the chances of something like this happening? Chances of any of this occuring are extremely low.  By statistics, you have more of a chance of getting killed in a motor vehicle accident: while driving to the hospital than any of the above occurring .  Nevertheless, you should be aware that they are possibilities.  In general, it is similar to taking a shower.  Everybody knows that you can slip, hit your head and get killed.  Does that mean that you should not shower again?  Nevertheless always keep in mind that statistics do not mean anything if you happen to be on the wrong side of them.  Even if a procedure has a 1  (one) in a 1,000,000 (million) chance of going wrong, it you happen to be that one..Also, keep in mind that by statistics, you have more of a chance of having something go wrong when taking medications.  Who should not have this procedure? If you are on a blood thinning medication (e.g. Coumadin, Plavix, see list of "Blood Thinners"), or if you have an active infection going on, you should not have the procedure.  If you are taking any blood thinners, please inform your physician.  How should I prepare for this procedure?  Do not eat or drink anything at least six hours prior to the procedure.  Bring a driver with you .  It cannot be a taxi.  Come accompanied by an adult that can drive you back, and that is strong enough to help you if your legs get weak or numb from the local anesthetic.  Take all of your medicines the morning of the procedure with just enough water to swallow them.  If you have diabetes, make sure that you are scheduled to have your procedure done first thing in the morning, whenever possible.  If you have diabetes, take only half of your insulin dose and notify our nurse that you have done so as soon as you arrive at the clinic.  If you are diabetic, but only take blood sugar pills (oral hypoglycemic), then do not take them on the morning of your procedure.  You may take them after you have had the procedure.  Do not take aspirin or any aspirin-containing medications, at least eleven (11) days prior to the procedure.  They may prolong bleeding.  Wear loose fitting clothing that may be easy to take off and that you would not mind if it got stained with Betadine or blood.  Do not wear any jewelry or perfume  Remove any nail coloring.  It will interfere with some of our monitoring equipment.  NOTE: Remember that this is not meant to be interpreted as a complete list of all possible complications.  Unforeseen problems may occur.  BLOOD THINNERS The following drugs  contain aspirin or other products, which can cause increased bleeding during surgery and should not be taken for 2 weeks prior to and 1 week after surgery.  If you should need take something for relief of minor pain, you may take acetaminophen which is found in Tylenol,m Datril, Anacin-3 and Panadol. It is not blood thinner. The products listed below are.  Do not take any of the products listed below in addition to any listed on your instruction sheet.  A.P.C or A.P.C with Codeine Codeine Phosphate Capsules #3 Ibuprofen Ridaura  ABC compound Congesprin Imuran rimadil  Advil Cope Indocin Robaxisal  Alka-Seltzer Effervescent Pain Reliever and Antacid Coricidin or Coricidin-D  Indomethacin Rufen    Alka-Seltzer plus Cold Medicine Cosprin Ketoprofen S-A-C Tablets  Anacin Analgesic Tablets or Capsules Coumadin Korlgesic Salflex  Anacin Extra Strength Analgesic tablets or capsules CP-2 Tablets Lanoril Salicylate  Anaprox Cuprimine Capsules Levenox Salocol  Anexsia-D Dalteparin Magan Salsalate  Anodynos Darvon compound Magnesium Salicylate Sine-off  Ansaid Dasin Capsules Magsal Sodium Salicylate  Anturane Depen Capsules Marnal Soma  APF Arthritis pain formula Dewitt's Pills Measurin Stanback  Argesic Dia-Gesic Meclofenamic Sulfinpyrazone  Arthritis Bayer Timed Release Aspirin Diclofenac Meclomen Sulindac  Arthritis pain formula Anacin Dicumarol Medipren Supac  Analgesic (Safety coated) Arthralgen Diffunasal Mefanamic Suprofen  Arthritis Strength Bufferin Dihydrocodeine Mepro Compound Suprol  Arthropan liquid Dopirydamole Methcarbomol with Aspirin Synalgos  ASA tablets/Enseals Disalcid Micrainin Tagament  Ascriptin Doan's Midol Talwin  Ascriptin A/D Dolene Mobidin Tanderil  Ascriptin Extra Strength Dolobid Moblgesic Ticlid  Ascriptin with Codeine Doloprin or Doloprin with Codeine Momentum Tolectin  Asperbuf Duoprin Mono-gesic Trendar  Aspergum Duradyne Motrin or Motrin IB Triminicin  Aspirin  plain, buffered or enteric coated Durasal Myochrisine Trigesic  Aspirin Suppositories Easprin Nalfon Trillsate  Aspirin with Codeine Ecotrin Regular or Extra Strength Naprosyn Uracel  Atromid-S Efficin Naproxen Ursinus  Auranofin Capsules Elmiron Neocylate Vanquish  Axotal Emagrin Norgesic Verin  Azathioprine Empirin or Empirin with Codeine Normiflo Vitamin E  Azolid Emprazil Nuprin Voltaren  Bayer Aspirin plain, buffered or children's or timed BC Tablets or powders Encaprin Orgaran Warfarin Sodium  Buff-a-Comp Enoxaparin Orudis Zorpin  Buff-a-Comp with Codeine Equegesic Os-Cal-Gesic   Buffaprin Excedrin plain, buffered or Extra Strength Oxalid   Bufferin Arthritis Strength Feldene Oxphenbutazone   Bufferin plain or Extra Strength Feldene Capsules Oxycodone with Aspirin   Bufferin with Codeine Fenoprofen Fenoprofen Pabalate or Pabalate-SF   Buffets II Flogesic Panagesic   Buffinol plain or Extra Strength Florinal or Florinal with Codeine Panwarfarin   Buf-Tabs Flurbiprofen Penicillamine   Butalbital Compound Four-way cold tablets Penicillin   Butazolidin Fragmin Pepto-Bismol   Carbenicillin Geminisyn Percodan   Carna Arthritis Reliever Geopen Persantine   Carprofen Gold's salt Persistin   Chloramphenicol Goody's Phenylbutazone   Chloromycetin Haltrain Piroxlcam   Clmetidine heparin Plaquenil   Cllnoril Hyco-pap Ponstel   Clofibrate Hydroxy chloroquine Propoxyphen         Before stopping any of these medications, be sure to consult the physician who ordered them.  Some, such as Coumadin (Warfarin) are ordered to prevent or treat serious conditions such as "deep thrombosis", "pumonary embolisms", and other heart problems.  The amount of time that you may need off of the medication may also vary with the medication and the reason for which you were taking it.  If you are taking any of these medications, please make sure you notify your pain physician before you undergo any  procedures.         Epidural Steroid Injection An epidural steroid injection is given to relieve pain in your neck, back, or legs that is caused by the irritation or swelling of a nerve root. This procedure involves injecting a steroid and numbing medicine (anesthetic) into the epidural space. The epidural space is the space between the outer covering of your spinal cord and the bones that form your backbone (vertebra).  LET YOUR HEALTH CARE PROVIDER KNOW ABOUT:   Any allergies you have.  All medicines you are taking, including vitamins, herbs, eye drops, creams, and over-the-counter medicines such as aspirin.  Previous problems you or members of your family have had with the use of anesthetics.  Any blood disorders or blood clotting disorders you have.    Previous surgeries you have had.  Medical conditions you have. RISKS AND COMPLICATIONS Generally, this is a safe procedure. However, as with any procedure, complications can occur. Possible complications of epidural steroid injection include:  Headache.  Bleeding.  Infection.  Allergic reaction to the medicines.  Damage to your nerves. The response to this procedure depends on the underlying cause of the pain and its duration. People who have long-term (chronic) pain are less likely to benefit from epidural steroids than are those people whose pain comes on strong and suddenly. BEFORE THE PROCEDURE   Ask your health care provider about changing or stopping your regular medicines. You may be advised to stop taking blood-thinning medicines a few days before the procedure.  You may be given medicines to reduce anxiety.  Arrange for someone to take you home after the procedure. PROCEDURE   You will remain awake during the procedure. You may receive medicine to make you relaxed.  You will be asked to lie on your stomach.  The injection site will be cleaned.  The injection site will be numbed with a medicine (local  anesthetic).  A needle will be injected through your skin into the epidural space.  Your health care provider will use an X-ray machine to ensure that the steroid is delivered closest to the affected nerve. You may have minimal discomfort at this time.  Once the needle is in the right position, the local anesthetic and the steroid will be injected into the epidural space.  The needle will then be removed and a bandage will be applied to the injection site. AFTER THE PROCEDURE   You may be monitored for a short time before you go home.  You may feel weakness or numbness in your arm or leg, which disappears within hours.  You may be allowed to eat, drink, and take your regular medicine.  You may have soreness at the site of the injection.   This information is not intended to replace advice given to you by your health care provider. Make sure you discuss any questions you have with your health care provider.   Document Released: 11/09/2007 Document Revised: 04/04/2013 Document Reviewed: 01/19/2013 [employer] Interactive Patient Education 2016 [employer]  

## 2016-03-30 NOTE — Progress Notes (Signed)
Safety precautions to be maintained throughout the outpatient stay will include: orient to surroundings, keep bed in low position, maintain call bell within reach at all times, provide assistance with transfer out of bed and ambulation.  

## 2016-03-31 DIAGNOSIS — I5042 Chronic combined systolic (congestive) and diastolic (congestive) heart failure: Secondary | ICD-10-CM | POA: Diagnosis not present

## 2016-03-31 DIAGNOSIS — E785 Hyperlipidemia, unspecified: Secondary | ICD-10-CM | POA: Diagnosis not present

## 2016-03-31 DIAGNOSIS — M549 Dorsalgia, unspecified: Secondary | ICD-10-CM | POA: Diagnosis not present

## 2016-03-31 DIAGNOSIS — I1310 Hypertensive heart and chronic kidney disease without heart failure, with stage 1 through stage 4 chronic kidney disease, or unspecified chronic kidney disease: Secondary | ICD-10-CM | POA: Diagnosis not present

## 2016-03-31 DIAGNOSIS — N183 Chronic kidney disease, stage 3 (moderate): Secondary | ICD-10-CM | POA: Diagnosis not present

## 2016-03-31 DIAGNOSIS — I255 Ischemic cardiomyopathy: Secondary | ICD-10-CM | POA: Diagnosis not present

## 2016-03-31 DIAGNOSIS — J449 Chronic obstructive pulmonary disease, unspecified: Secondary | ICD-10-CM | POA: Diagnosis not present

## 2016-03-31 DIAGNOSIS — I2510 Atherosclerotic heart disease of native coronary artery without angina pectoris: Secondary | ICD-10-CM | POA: Diagnosis not present

## 2016-03-31 DIAGNOSIS — M79662 Pain in left lower leg: Secondary | ICD-10-CM | POA: Diagnosis not present

## 2016-03-31 DIAGNOSIS — M150 Primary generalized (osteo)arthritis: Secondary | ICD-10-CM | POA: Diagnosis not present

## 2016-03-31 NOTE — Progress Notes (Signed)
Subjective:  Patient ID: Katherine [name], female    DOB: 12/15/1929  Age: 82 y.o. MRN: [id number]  CC: Back Pain (lower)  Procedure today: None PROCEDURE last visit :  Caudal epidural steroid No. 1 of series with fluoroscopic guidance and moderate sedation  HPI Katherine [name] presents for a re- evaluation. She gained some relief with her last caudal epidural injection for approximately 3-5 days. She has had recurrence of the same quality pain that is approximately 75% of its previous level. No other changes in bowel or bladder function are noted. Her lower extremity strength and function have been stable. She's been taking her medications as prescribed with no reported problems. Unfortunately she did not stop her Plavix.   She has a long-standing history of diffuse body pain including low back pain. The pain she experiences rates at a VAS of 10 and best 7 but has gradually gotten worse to the point where she has soft hypotension. She has pain in the afternoon and night and with immobility. Aggravating factors include bending climbing kneeling motion sitting standing squatting and twisting. Alleviating factors include rest sleep lying down and medication management. In the past she has had epidural steroid injections which seem to help alleviate some of her low back pain.  Currently the pain complaints she experiences is similar to what she had before with associated tingling worse on the left posterior and lateral leg with associated weakness and pain that keeps her up at night. It is described as aching and cramping deep disabling. She has had an MRI which reveals multilevel facet degeneration in the lumbosacral spine and evidence of multilevel degenerative disc disease.  Since her last visit she reports no significant changes in her lower extremity strength or function.  History Katherine [name] has a past medical history of Arthritis; Chicken pox; Chronic combined systolic and diastolic heart failure (HCC); COPD  (chronic obstructive pulmonary disease) ([place]); GERD (gastroesophageal reflux disease); Heart attack ([place]); Hyperlipidemia; Hypertension; Hypertensive heart disease; Ischemic cardiomyopathy; and Occlusive coronary artery disease requiring drug therapy.   She has a past surgical history that includes Shoulder surgery; Cardiac catheterization (N/A, 10/30/2015); Cardiac catheterization (N/A, 10/30/2015); Total hip arthroplasty (Right, 2012); Back surgery (2010); Amputation finger (Left, 1966); Appendectomy (1961); Tonsillectomy (1953); and Abdominal hysterectomy (1985).   Her family history includes Arthritis in her sister and sister; Breast cancer in her sister; CAD in her brother and sister; Heart disease in her brother, brother, brother, brother, brother, brother, mother, sister, sister, sister, and sister; Hyperlipidemia in her mother; Hypertension in her brother, brother, brother, brother, brother, brother, mother, sister, sister, sister, and sister; Lung cancer in her mother; Stroke in her mother, sister, sister, sister, and sister.She reports that she has never smoked. She has never used smokeless tobacco. She reports that she does not drink alcohol or use drugs.  No results found for this or any previous visit.  No results found for: TOXASSSELUR  Outpatient Medications Prior to Visit  Medication Sig Dispense Refill  . amLODipine (NORVASC) 2.5 MG tablet Take 1 tablet (2.5 mg total) by mouth daily. 90 tablet 3  . aspirin EC 81 MG tablet Take 81 mg by mouth daily.    Marland Kitchen atorvastatin (LIPITOR) 80 MG tablet Take 1 tablet (80 mg total) by mouth daily at 6 PM. 90 tablet 3  . baclofen (LIORESAL) 10 MG tablet Take 1 tablet (10 mg total) by mouth 3 (three) times daily. (Patient taking differently: Take 10 mg by mouth 2 (two) times daily. )  90 each 0  . carvedilol (COREG) 6.25 MG tablet Take 1 tablet (6.25 mg total) by mouth 2 (two) times daily with a meal. 180 tablet 3  . clopidogrel (PLAVIX) 75 MG tablet  Take 1 tablet (75 mg total) by mouth daily. 90 tablet 3  . furosemide (LASIX) 20 MG tablet Take 1 tablet (20 mg total) by mouth every other day. 30 tablet 3  . gabapentin (NEURONTIN) 100 MG capsule Take 1 capsule (100 mg total) by mouth 3 (three) times daily. Take two capsules three times daily. 540 capsule 3  . lisinopril (PRINIVIL,ZESTRIL) 20 MG tablet Take 1 tablet (20 mg total) by mouth daily. 90 tablet 3  . nitroGLYCERIN (NITROSTAT) 0.4 MG SL tablet Place 1 tablet (0.4 mg total) under the tongue every 5 (five) minutes as needed for chest pain. 25 tablet 3  . omega-3 acid ethyl esters (LOVAZA) 1 g capsule Take 1 g by mouth daily.    . pantoprazole (PROTONIX) 40 MG tablet Take 1 tablet (40 mg total) by mouth daily. 30 tablet 3  . sertraline (ZOLOFT) 100 MG tablet Take 1 tablet (100 mg total) by mouth daily. 90 tablet 3  . HYDROcodone-acetaminophen (NORCO/VICODIN) 5-325 MG tablet Take 1 tablet by mouth every 4 (four) hours as needed for moderate pain. 60 tablet 0   Facility-Administered Medications Prior to Visit  Medication Dose Route Frequency Provider Last Rate Last Dose  . iopamidol (ISOVUE-M) 41 % intrathecal injection 20 mL  20 mL Other Once PRN Molli Barrows, MD      . lactated ringers infusion 1,000 mL  1,000 mL Intravenous Continuous Molli Barrows, MD      . lidocaine (PF) (XYLOCAINE) 1 % injection 5 mL  5 mL Subcutaneous Once Molli Barrows, MD      . midazolam (VERSED) injection 5 mg  5 mg Intravenous Once Molli Barrows, MD      . ropivacaine (PF) 2 mg/ml (0.2%) (NAROPIN) epidural 10 mL  10 mL Epidural Once Molli Barrows, MD      . sodium chloride flush (NS) 0.9 % injection 10 mL  10 mL Other Once Molli Barrows, MD      . triamcinolone acetonide (KENALOG-40) injection 40 mg  40 mg Other Once Molli Barrows, MD       Lab Results  Component Value Date   WBC 7.7 01/08/2016   HGB 12.8 01/08/2016   HCT 38.4 01/08/2016   PLT 192 01/08/2016   GLUCOSE 145 (H) 01/08/2016   CHOL 114  12/16/2015   TRIG 126 12/16/2015   HDL 41 12/16/2015   LDLCALC 48 12/16/2015   ALT 25 01/07/2016   AST 36 01/07/2016   NA 141 01/08/2016   K 3.9 01/08/2016   CL 109 01/08/2016   CREATININE 0.92 01/08/2016   BUN 21 (H) 01/08/2016   CO2 26 01/08/2016   TSH 0.345 (L) 10/30/2015   INR 1.12 10/30/2015   HGBA1C 6.0 (H) 10/30/2015    --------------------------------------------------------------------------------------------------------------------- Dg Knee 1-2 Views Left  01/07/2016  CLINICAL DATA:  Left anterior knee pain beginning today.  No injury. EXAM: LEFT KNEE - 1-2 VIEW COMPARISON:  None. FINDINGS: No evidence of fracture, dislocation, or joint effusion. Chondrocalcinosis. No notable degenerative changes for age. Osteopenia. IMPRESSION: No acute finding. Electronically Signed   By: Monte Fantasia M.D.   On: 01/07/2016 20:00   Mr Lumbar Spine Wo Contrast  01/08/2016  CLINICAL DATA:  Low back pain with left thigh pain radiating  down to the left foot EXAM: MRI LUMBAR SPINE WITHOUT CONTRAST TECHNIQUE: Multiplanar, multisequence MR imaging of the lumbar spine was performed. No intravenous contrast was administered. COMPARISON:  None. FINDINGS: Segmentation:  Standard. Alignment: 8 mm of anterolisthesis of L4 on L5 secondary to bilateral L4 pars interarticularis defects. The alignment is otherwise anatomic. Vertebrae:  No fracture, evidence of discitis, or bone lesion. Conus medullaris: Extends to the T12-L1 level and appears normal. Paraspinal and other soft tissues: Negative. Disc levels: Disc spaces: Degenerative disc disease with disc height loss at L3-4, L4-5 and L5-S1. T11-12: Mild broad-based disc bulge. Mild bilateral facet arthropathy. No foraminal or central canal stenosis. T12-L1: No significant disc bulge. No evidence of neural foraminal stenosis. No central canal stenosis. L1-L2: Mild broad-based disc bulge. Mild bilateral facet arthropathy. No evidence of neural foraminal stenosis.  No central canal stenosis. L2-L3: Mild broad-based disc bulge. Moderate right facet arthropathy. Mild right foraminal stenosis. Right lateral recess stenosis. No central canal stenosis. L3-L4: Mild broad-based disc bulge. Mild bilateral facet arthropathy. Mild right foraminal stenosis. Right lateral recess stenosis. No left foraminal stenosis. No central canal stenosis. L4-L5: No disc protrusion. Grade 1 anterolisthesis of L4 on L5. Severe bilateral facet arthropathy and bilateral L4 pars interarticularis defects. Severe bilateral foraminal stenosis. Mild spinal stenosis. L5-S1: Mild broad-based disc bulge. Mild bilateral facet arthropathy. Mild left foraminal stenosis. No central canal stenosis. IMPRESSION: 1. At L4-5 there is grade 1 anterolisthesis of L4 on L5. Severe bilateral facet arthropathy and bilateral L4 pars interarticularis defects. Severe bilateral foraminal stenosis. Mild spinal stenosis. 2. At L2-3 there is a mild broad-based disc bulge. Moderate right facet arthropathy. Mild right foraminal stenosis. Right lateral recess stenosis. 3. At L3-4 there is a mild Mild broad-based disc bulge. Mild bilateral facet arthropathy. Mild right foraminal stenosis. Right lateral recess stenosis. Electronically Signed   By: Kathreen Devoid   On: 01/08/2016 09:27   Ct Cta Abd/pel W/cm &/or W/o Cm  01/07/2016  ADDENDUM REPORT: 01/07/2016 19:54 ADDENDUM: The there are a few small bilateral sub cm renal cortical hypodensities too small to characterize but likely cysts. Electronically Signed   By: Marin Olp M.D.   On: 01/07/2016 19:54  01/07/2016  CLINICAL DATA:  Sciatica.  Possible dehydration. EXAM: CTA ABDOMEN AND PELVIS wITHOUT AND WITH CONTRAST TECHNIQUE: Multidetector CT imaging of the abdomen and pelvis was performed using the standard protocol during bolus administration of intravenous contrast. Multiplanar reconstructed images and MIPs were obtained and reviewed to evaluate the vascular anatomy. CONTRAST:   100 mL Isovue 370 IV COMPARISON:  None. FINDINGS: Lung bases are within normal. Abdominal images demonstrate multiple splenic granulomas. The liver, pancreas, gallbladder and adrenal glands are normal. Kidneys are normal in size without hydronephrosis or nephrolithiasis. There is a 2.3 cm cyst over the mid pole the right kidney. Ureters are within normal. There is mild calcified plaque over the abdominal aorta iliac arteries. Mild focal stenosis at the takeoff of the celiac axis for comparison Appendix is not well visualized. Minimal diverticulosis of the colon which is otherwise within normal. Small bowel is within normal. Mesentery is unremarkable without inflammatory change or free fluid. Pelvic images demonstrate mild bladder distension. Surgical absence of the uterus. No free pelvic fluid. Right hip arthroplasty intact. There are degenerative changes of the spine with multilevel disc disease over the lumbosacral spine mean grade 1-2 anterolisthesis of L4 on L5. Previous L3-L5 laminectomy. Several metallic densities over the inguinal regions. Review of the MIP images confirms the above  findings. IMPRESSION: No acute findings in the abdomen/ pelvis. 2.3 cm right renal cyst. Minimal diverticulosis of the colon. Significant degenerative changes with multilevel disc disease over the lumbosacral spine. Grade 1-2 anterolisthesis L4 on L5. Electronically Signed: By: Marin Olp M.D. On: 01/07/2016 18:26       ---------------------------------------------------------------------------------------------------------------------- Past Medical History:  Diagnosis Date  . Arthritis   . Chicken pox   . Chronic combined systolic and diastolic heart failure (HCC)    EF 35-40%, Akinesis of mid-apicalanteroseptal wall & AK of apex, Gr 1DD.  Marland Kitchen COPD (chronic obstructive pulmonary disease) ([place])    worked in a [place] [employer]  . GERD (gastroesophageal reflux disease)   . Heart attack ([place])    4/17  . Hyperlipidemia   .  Hypertension   . Hypertensive heart disease   . Ischemic cardiomyopathy   . Occlusive coronary artery disease requiring drug therapy    a. Remote MI - never sought treatment when it occurred, shows up old on EKG;  b. Cath 10/2015 - 100% pLAD CTO with 80% D1, prox Cx 60% - EF ~35%    Past Surgical History:  Procedure Laterality Date  . ABDOMINAL HYSTERECTOMY  1985  . AMPUTATION FINGER Left 1966   ring finger  . APPENDECTOMY  1961  . BACK SURGERY  2010   lower back  . CARDIAC CATHETERIZATION N/A 10/30/2015   Procedure: Right/Left Heart Cath and Coronary Angiography;  Surgeon: Wellington Hampshire, MD;  Location: [facility];  Service: Cardiovascular;  Laterality: N/A;  . CARDIAC CATHETERIZATION N/A 10/30/2015   Procedure: Intravascular Pressure Wire/FFR Study;  Surgeon: Wellington Hampshire, MD;  Location: [facility];  Service: Cardiovascular;  Laterality: N/A;  . SHOULDER SURGERY    . TONSILLECTOMY  1953  . TOTAL HIP ARTHROPLASTY Right 2012    Family History  Problem Relation Age of Onset  . Heart disease Mother   . Stroke Mother   . Hypertension Mother   . Lung cancer Mother   . Hyperlipidemia Mother   . CAD Sister   . Heart disease Sister   . Stroke Sister   . Hypertension Sister   . Arthritis Sister   . CAD Brother   . Heart disease Brother   . Hypertension Brother   . Heart disease Sister   . Stroke Sister   . Hypertension Sister   . Arthritis Sister   . Heart disease Sister   . Stroke Sister   . Hypertension Sister   . Breast cancer Sister   . Heart disease Sister   . Stroke Sister   . Hypertension Sister   . Heart disease Brother   . Hypertension Brother   . Heart disease Brother   . Hypertension Brother   . Heart disease Brother   . Hypertension Brother   . Heart disease Brother   . Hypertension Brother   . Heart disease Brother   . Hypertension Brother     Social History  Substance Use Topics  . Smoking status: Never Smoker  . Smokeless  tobacco: Never Used  . Alcohol use No    ---------------------------------------------------------------------------------------------------------------------- Social History   Social History  . Marital status: Single    Spouse name: N/A  . Number of children: N/A  . Years of education: N/A   Social History Main Topics  . Smoking status: Never Smoker  . Smokeless tobacco: Never Used  . Alcohol use No  . Drug use: No  . Sexual activity: No  Other Topics Concern  . None   Social History Narrative  . None    Scheduled Meds: Continuous Infusions: PRN Meds:.   BP (!) 142/51   Pulse (!) 54   Temp 98.5 F (36.9 C) (Oral)   Resp 16   Ht 5\' 1"  (1.549 m)   Wt 138 lb (62.6 kg)   SpO2 99%   BMI 26.07 kg/m    BP Readings from Last 3 Encounters:  03/30/16 (!) 142/51  03/25/16 124/69  03/22/16 (!) 144/54     Wt Readings from Last 3 Encounters:  03/30/16 138 lb (62.6 kg)  03/25/16 139 lb 6 oz (63.2 kg)  03/22/16 140 lb (63.5 kg)     ----------------------------------------------------------------------------------------------------------------------  ROS Review of Systems  Cardiac: History of A. fib on a daily aspirin taking Plavix this was DC'd approximately 10 days ago. Also high blood pressure and history of heart attack in March 2017. History of congestive heart failure previous heart catheterization. Pulmonary: Lung problems with history of shortness of breath Neurologic: Scoliosis and chronic low back pain Psychologic: Negative GI reflux Hematologic easy bruising and bleeding Rheumatologic history of osteoarthritis fibromyalgia and chronic fatigue syndrome and rheumatoid arthritis  Objective:  BP (!) 142/51   Pulse (!) 54   Temp 98.5 F (36.9 C) (Oral)   Resp 16   Ht 5\' 1"  (1.549 m)   Wt 138 lb (62.6 kg)   SpO2 99%   BMI 26.07 kg/m   Physical Exam Pupils are equally round reactive to light with extraocular muscles intact Patient is a good  historian appropriate cooperative compliant Heart is irregular with appropriate right Lungs are clear to also dictation without rales or wheezes Inspection low back reveals some paraspinous muscle tenderness. 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 on the left her dorsiflexion is 5 over 5 and plantar flexion at 5/5     Assessment & Plan:   Katherine [name] was seen today for back pain.  Diagnoses and all orders for this visit:  Spinal stenosis of lumbar region -     LUMBAR EPIDURAL STEROID INJECTION  Failed back syndrome of lumbar spine -     LUMBAR EPIDURAL STEROID INJECTION  Other orders -     Discontinue: HYDROcodone-acetaminophen (NORCO/VICODIN) 5-325 MG tablet; Take 1 tablet by mouth every 4 (four) hours as needed for moderate pain. -     HYDROcodone-acetaminophen (NORCO/VICODIN) 5-325 MG tablet; Take 1 tablet by mouth every 4 (four) hours as needed for moderate pain.     ----------------------------------------------------------------------------------------------------------------------  Problem List Items Addressed This Visit    None    Visit Diagnoses    Spinal stenosis of lumbar region       Failed back syndrome of lumbar spine          ----------------------------------------------------------------------------------------------------------------------  1. Spinal stenosis of lumbar region  -We will defer on repeat injection today and have her return to clinic in 1 week for an epidural injection after discontinuation of her Plavix.  2. Failed back syndrome of lumbar spine  We will refill her medications today with one additional refill in 1 month since she will be traveling in approximately 6 weeks.  - LUMBAR EPIDURAL STEROID INJECTION;  Future    ----------------------------------------------------------------------------------------------------------------------  I am having Ms. Bynoe maintain her aspirin EC, nitroGLYCERIN, atorvastatin, furosemide, omega-3 acid ethyl esters, amLODipine, carvedilol, gabapentin, lisinopril,  sertraline, pantoprazole, clopidogrel, baclofen, multivitamin-iron-minerals-folic acid, Biotin, Vitamin D (Ergocalciferol), thiamine, and HYDROcodone-acetaminophen. We will continue to administer triamcinolone acetonide, sodium chloride flush, ropivacaine (PF) 2 mg/ml (0.2%), midazolam, lidocaine (PF), lactated ringers, and iopamidol.   Meds ordered this encounter  Medications  . multivitamin-iron-minerals-folic acid (CENTRUM) chewable tablet    Sig: Chew 1 tablet by mouth daily.  . Biotin 1 MG CAPS    Sig: Take by mouth.  . Vitamin D, Ergocalciferol, (DRISDOL) 50000 units CAPS capsule    Sig: Take 50,000 Units by mouth every 7 (seven) days.  Marland Kitchen thiamine (VITAMIN B-1) 100 MG tablet    Sig: Take 100 mg by mouth daily.  Marland Kitchen DISCONTD: HYDROcodone-acetaminophen (NORCO/VICODIN) 5-325 MG tablet    Sig: Take 1 tablet by mouth every 4 (four) hours as needed for moderate pain.    Dispense:  60 tablet    Refill:  0    Do not fill until [id number]  . HYDROcodone-acetaminophen (NORCO/VICODIN) 5-325 MG tablet    Sig: Take 1 tablet by mouth every 4 (four) hours as needed for moderate pain.    Dispense:  60 tablet    Refill:  0    Do not fill until [id number]   Follow-up: Return in about 7 days (around 04/06/2016) for procedure.    Molli Barrows, MD  This dictation was performed utilizing Dragon voice recognition software.  Please excuse any unintentional or mistaken typographical errors as a result of its unedited utilization.

## 2016-04-05 ENCOUNTER — Encounter: Payer: Self-pay | Admitting: Cardiovascular Disease

## 2016-04-05 ENCOUNTER — Ambulatory Visit (INDEPENDENT_AMBULATORY_CARE_PROVIDER_SITE_OTHER): Payer: Medicare HMO | Admitting: Cardiovascular Disease

## 2016-04-05 VITALS — BP 120/60 | HR 54 | Ht 61.0 in | Wt 136.8 lb

## 2016-04-05 DIAGNOSIS — E785 Hyperlipidemia, unspecified: Secondary | ICD-10-CM

## 2016-04-05 DIAGNOSIS — I2510 Atherosclerotic heart disease of native coronary artery without angina pectoris: Secondary | ICD-10-CM

## 2016-04-05 DIAGNOSIS — I5022 Chronic systolic (congestive) heart failure: Secondary | ICD-10-CM | POA: Diagnosis not present

## 2016-04-05 DIAGNOSIS — I10 Essential (primary) hypertension: Secondary | ICD-10-CM

## 2016-04-05 NOTE — Progress Notes (Signed)
Cardiology Office Note   Date:  04/05/2016   ID:  Katherine [name], DOB 02/20/1930, MRN [id number]  PCP:  Katherine Spikes, DO  Cardiologist:   Katherine Sacramento, MD   Chief Complaint  Patient presents with  . Other    3 month follow up. Pt. was at [facility] last week due to a fall. Pt. c/o a knot on her right leg.  Meds reviewed by the patient verbally.       History of Present Illness: Katherine [name] is a 82 y.o. female who presents for A follow-up visit regarding chronic systolic heart failure and coronary artery disease. She has a history of remote myocardial infarction, CHF, chronic kidney disease, and COPD.  She had a small non-ST elevation myocardial infarction in March 2017. Cardiac catheterization showed chronic total occlusion of the LAD with moderate left circumflex disease and otherwise minor irregularities. Fractional flow reserve was performed within the left circumflex and was normal at 0.92. Medical therapy was recommended. EF was 35% and right heart catheterization revealed pulmonary hypertension and elevated filling pressures with a pulmonary capillary wedge pressure of 22.  Echocardiogram showed an EF 35-40% with grade 1 diastolic dysfunction.   ABI was done in May for leg cramps and came back normal.  She has been doing reasonably well and denies any chest pain or significant dyspnea. No orthopnea or PND. Leg edema is mild and overall stable. She fell on August 7th while she was in the bathroom. She did not have syncope. There was no significant injuries. This was her second fall in the last 3-4 months.  Past Medical History:  Diagnosis Date  . Arthritis   . Chicken pox   . Chronic combined systolic and diastolic heart failure (HCC)    EF 35-40%, Akinesis of mid-apicalanteroseptal wall & AK of apex, Gr 1DD.  Marland Kitchen COPD (chronic obstructive pulmonary disease) ([place])    worked in a [place]  . GERD (gastroesophageal reflux disease)   . Heart attack ([place])    4/17  .  Hyperlipidemia   . Hypertension   . Hypertensive heart disease   . Ischemic cardiomyopathy   . Occlusive coronary artery disease requiring drug therapy    a. Remote MI - never sought treatment when it occurred, shows up old on EKG;  b. Cath 10/2015 - 100% pLAD CTO with 80% D1, prox Cx 60% - EF ~35%    Past Surgical History:  Procedure Laterality Date  . ABDOMINAL HYSTERECTOMY  1985  . AMPUTATION FINGER Left 1966   ring finger  . APPENDECTOMY  1961  . BACK SURGERY  2010   lower back  . CARDIAC CATHETERIZATION N/A 10/30/2015   Procedure: Right/Left Heart Cath and Coronary Angiography;  Surgeon: Katherine Hampshire, MD;  Location: [place] [facility];  Service: Cardiovascular;  Laterality: N/A;  . CARDIAC CATHETERIZATION N/A 10/30/2015   Procedure: Intravascular Pressure Wire/FFR Study;  Surgeon: Katherine Hampshire, MD;  Location: [facility];  Service: Cardiovascular;  Laterality: N/A;  . SHOULDER SURGERY    . TONSILLECTOMY  1953  . TOTAL HIP ARTHROPLASTY Right 2012     Current Outpatient Prescriptions  Medication Sig Dispense Refill  . amLODipine (NORVASC) 2.5 MG tablet Take 1 tablet (2.5 mg total) by mouth daily. 90 tablet 3  . aspirin EC 81 MG tablet Take 81 mg by mouth daily.    Marland Kitchen atorvastatin (LIPITOR) 80 MG tablet Take 1 tablet (80 mg total) by mouth daily at  6 PM. 90 tablet 3  . baclofen (LIORESAL) 10 MG tablet Take 1 tablet (10 mg total) by mouth 3 (three) times daily. (Patient taking differently: Take 10 mg by mouth 2 (two) times daily. ) 90 each 0  . Biotin 1 MG CAPS Take by mouth.    . carvedilol (COREG) 6.25 MG tablet Take 1 tablet (6.25 mg total) by mouth 2 (two) times daily with a meal. 180 tablet 3  . clopidogrel (PLAVIX) 75 MG tablet Take 1 tablet (75 mg total) by mouth daily. 90 tablet 3  . furosemide (LASIX) 20 MG tablet Take 1 tablet (20 mg total) by mouth every other day. 30 tablet 3  . gabapentin (NEURONTIN) 100 MG capsule Take 1 capsule (100 mg total) by mouth 3  (three) times daily. Take two capsules three times daily. 540 capsule 3  . HYDROcodone-acetaminophen (NORCO/VICODIN) 5-325 MG tablet Take 1 tablet by mouth every 4 (four) hours as needed for moderate pain. 60 tablet 0  . lisinopril (PRINIVIL,ZESTRIL) 20 MG tablet Take 1 tablet (20 mg total) by mouth daily. 90 tablet 3  . multivitamin-iron-minerals-folic acid (CENTRUM) chewable tablet Chew 1 tablet by mouth daily.    . nitroGLYCERIN (NITROSTAT) 0.4 MG SL tablet Place 1 tablet (0.4 mg total) under the tongue every 5 (five) minutes as needed for chest pain. 25 tablet 3  . omega-3 acid ethyl esters (LOVAZA) 1 g capsule Take 1 g by mouth daily.    . pantoprazole (PROTONIX) 40 MG tablet Take 1 tablet (40 mg total) by mouth daily. 30 tablet 3  . sertraline (ZOLOFT) 100 MG tablet Take 1 tablet (100 mg total) by mouth daily. 90 tablet 3  . thiamine (VITAMIN B-1) 100 MG tablet Take 100 mg by mouth daily.    . Vitamin D, Ergocalciferol, (DRISDOL) 50000 units CAPS capsule Take 50,000 Units by mouth every 7 (seven) days.     Current Facility-Administered Medications  Medication Dose Route Frequency Provider Last Rate Last Dose  . iopamidol (ISOVUE-M) 41 % intrathecal injection 20 mL  20 mL Other Once PRN Katherine Barrows, MD      . lactated ringers infusion 1,000 mL  1,000 mL Intravenous Continuous Katherine Barrows, MD      . lidocaine (PF) (XYLOCAINE) 1 % injection 5 mL  5 mL Subcutaneous Once Katherine Barrows, MD      . midazolam (VERSED) injection 5 mg  5 mg Intravenous Once Katherine Barrows, MD      . ropivacaine (PF) 2 mg/ml (0.2%) (NAROPIN) epidural 10 mL  10 mL Epidural Once Katherine Barrows, MD      . sodium chloride flush (NS) 0.9 % injection 10 mL  10 mL Other Once Katherine Barrows, MD      . triamcinolone acetonide (KENALOG-40) injection 40 mg  40 mg Other Once Katherine Barrows, MD        Allergies:   Review of patient's allergies indicates no known allergies.    Social History:  The patient  reports that she has  never smoked. She has never used smokeless tobacco. She reports that she does not drink alcohol or use drugs.   Family History:  The patient's family history includes Arthritis in her sister and sister; Breast cancer in her sister; CAD in her brother and sister; Heart disease in her brother, brother, brother, brother, brother, brother, mother, sister, sister, sister, and sister; Hyperlipidemia in her mother; Hypertension in her brother, brother, brother, brother, brother, brother, mother,  sister, sister, sister, and sister; Lung cancer in her mother; Stroke in her mother, sister, sister, sister, and sister.    ROS:  Please see the history of present illness.   Otherwise, review of systems are positive for none.   All other systems are reviewed and negative.    PHYSICAL EXAM: VS:  BP 120/60 (BP Location: Left Arm, Patient Position: Sitting, Cuff Size: Normal)   Pulse (!) 54   Ht 5\' 1"  (1.549 m)   Wt 136 lb 12 oz (62 kg)   BMI 25.84 kg/m  , BMI Body mass index is 25.84 kg/m. GEN: Well nourished, well developed, in no acute distress  HEENT: normal  Neck: no JVD, carotid bruits, or masses Cardiac: RRR; no rubs, or gallops, . There is one out of 6 systolic ejection murmur in the aortic area. +1 edema. Respiratory:  clear to auscultation bilaterally, normal work of breathing GI: soft, nontender, nondistended, + BS MS: no deformity or atrophy  Skin: warm and dry, no rash Neuro:  Strength and sensation are intact Psych: euthymic mood, full affect   EKG:  EKG is not ordered today.    Recent Labs: 10/30/2015: B Natriuretic Peptide 1,649.7; TSH 0.345 12/02/2015: Magnesium 2.3 01/07/2016: ALT 25 01/08/2016: BUN 21; Creatinine, Ser 0.92; Hemoglobin 12.8; Platelets 192; Potassium 3.9; Sodium 141    Lipid Panel    Component Value Date/Time   CHOL 114 12/16/2015 1422   TRIG 126 12/16/2015 1422   HDL 41 12/16/2015 1422   CHOLHDL 2.8 12/16/2015 1422   CHOLHDL 4.7 10/30/2015 0855   VLDL 28  10/30/2015 0855   LDLCALC 48 12/16/2015 1422      Wt Readings from Last 3 Encounters:  04/05/16 136 lb 12 oz (62 kg)  03/30/16 138 lb (62.6 kg)  03/25/16 139 lb 6 oz (63.2 kg)        ASSESSMENT AND PLAN:  1.  Chronic systolic heart failure: The patient appears to be euvolemic on small dose furosemide every other day.  She is not on spironolactone due to previous hyperlipidemia on higher dose lisinopril.  2. Coronary artery disease involving native coronary arteries without angina: She has chronically occluded LAD with collaterals and moderate left circumflex stenosis which was not significant by FFR.  She has been on dual antiplatelet therapy since March after presentation with a small non-ST elevation myocardial infarction. However, given her frequent falls, I am concerned about possible injuries and thus I elected to discontinue clopidogrel given that she did not really have any PCI performed.  3. Essential hypertension:   Blood pressure is now well controlled on current medications.  4. Hyperlipidemia: Continue high dose atorvastatin. Most recent lipid profile showed an LDL of 48.   Disposition:   FU with me in 3 months. The patient is going for a Family reunion in [place] .   Signed,  Katherine Sacramento, MD  04/05/2016 1:55 PM    [place]

## 2016-04-05 NOTE — Patient Instructions (Signed)
Medication Instructions:  Your physician has recommended you make the following change in your medication:  STOP taking plavix   Labwork: none  Testing/Procedures: none  Follow-Up: Your physician recommends that you schedule a follow-up appointment in: 3 months with Dr. Arida.    Any Other Special Instructions Will Be Listed Below (If Applicable).     If you need a refill on your cardiac medications before your next appointment, please call your pharmacy.   

## 2016-04-06 ENCOUNTER — Ambulatory Visit: Payer: Medicare HMO | Attending: Anesthesiology | Admitting: Anesthesiology

## 2016-04-06 ENCOUNTER — Encounter: Payer: Self-pay | Admitting: Anesthesiology

## 2016-04-06 VITALS — BP 168/68 | HR 62 | Temp 98.1°F | Resp 16 | Ht 61.0 in | Wt 136.0 lb

## 2016-04-06 DIAGNOSIS — I252 Old myocardial infarction: Secondary | ICD-10-CM | POA: Diagnosis not present

## 2016-04-06 DIAGNOSIS — I255 Ischemic cardiomyopathy: Secondary | ICD-10-CM | POA: Insufficient documentation

## 2016-04-06 DIAGNOSIS — J449 Chronic obstructive pulmonary disease, unspecified: Secondary | ICD-10-CM | POA: Insufficient documentation

## 2016-04-06 DIAGNOSIS — I2510 Atherosclerotic heart disease of native coronary artery without angina pectoris: Secondary | ICD-10-CM | POA: Diagnosis not present

## 2016-04-06 DIAGNOSIS — M545 Low back pain: Secondary | ICD-10-CM | POA: Insufficient documentation

## 2016-04-06 DIAGNOSIS — E785 Hyperlipidemia, unspecified: Secondary | ICD-10-CM | POA: Diagnosis not present

## 2016-04-06 DIAGNOSIS — R5382 Chronic fatigue, unspecified: Secondary | ICD-10-CM | POA: Diagnosis not present

## 2016-04-06 DIAGNOSIS — I110 Hypertensive heart disease with heart failure: Secondary | ICD-10-CM | POA: Insufficient documentation

## 2016-04-06 DIAGNOSIS — N281 Cyst of kidney, acquired: Secondary | ICD-10-CM | POA: Diagnosis not present

## 2016-04-06 DIAGNOSIS — M4806 Spinal stenosis, lumbar region: Secondary | ICD-10-CM

## 2016-04-06 DIAGNOSIS — Z7982 Long term (current) use of aspirin: Secondary | ICD-10-CM | POA: Diagnosis not present

## 2016-04-06 DIAGNOSIS — M961 Postlaminectomy syndrome, not elsewhere classified: Secondary | ICD-10-CM | POA: Diagnosis not present

## 2016-04-06 DIAGNOSIS — M419 Scoliosis, unspecified: Secondary | ICD-10-CM | POA: Diagnosis not present

## 2016-04-06 DIAGNOSIS — Z96641 Presence of right artificial hip joint: Secondary | ICD-10-CM | POA: Diagnosis not present

## 2016-04-06 DIAGNOSIS — Z8249 Family history of ischemic heart disease and other diseases of the circulatory system: Secondary | ICD-10-CM | POA: Diagnosis not present

## 2016-04-06 DIAGNOSIS — I10 Essential (primary) hypertension: Secondary | ICD-10-CM | POA: Diagnosis not present

## 2016-04-06 DIAGNOSIS — M8580 Other specified disorders of bone density and structure, unspecified site: Secondary | ICD-10-CM | POA: Insufficient documentation

## 2016-04-06 DIAGNOSIS — M5126 Other intervertebral disc displacement, lumbar region: Secondary | ICD-10-CM | POA: Diagnosis not present

## 2016-04-06 DIAGNOSIS — M48061 Spinal stenosis, lumbar region without neurogenic claudication: Secondary | ICD-10-CM

## 2016-04-06 DIAGNOSIS — M5124 Other intervertebral disc displacement, thoracic region: Secondary | ICD-10-CM | POA: Diagnosis not present

## 2016-04-06 DIAGNOSIS — M069 Rheumatoid arthritis, unspecified: Secondary | ICD-10-CM | POA: Insufficient documentation

## 2016-04-06 DIAGNOSIS — M797 Fibromyalgia: Secondary | ICD-10-CM | POA: Diagnosis not present

## 2016-04-06 DIAGNOSIS — I5042 Chronic combined systolic (congestive) and diastolic (congestive) heart failure: Secondary | ICD-10-CM | POA: Insufficient documentation

## 2016-04-06 DIAGNOSIS — K219 Gastro-esophageal reflux disease without esophagitis: Secondary | ICD-10-CM | POA: Insufficient documentation

## 2016-04-06 DIAGNOSIS — I4891 Unspecified atrial fibrillation: Secondary | ICD-10-CM | POA: Insufficient documentation

## 2016-04-06 MED ORDER — LIDOCAINE HCL (PF) 1 % IJ SOLN: 5.0000 mL | Freq: Once | INTRAMUSCULAR | Status: DC

## 2016-04-06 MED ORDER — MIDAZOLAM HCL 2 MG/2ML IJ SOLN
5.0000 mg | Freq: Once | INTRAMUSCULAR | Status: DC
  Filled 2016-04-06: qty 5

## 2016-04-06 MED ORDER — SODIUM CHLORIDE 0.9 % IJ SOLN
INTRAMUSCULAR | Status: AC
Start: 2016-04-06 — End: 2016-04-06
  Administered 2016-04-06: 16:00:00
  Filled 2016-04-06: qty 10

## 2016-04-06 MED ORDER — LACTATED RINGERS IV SOLN: 1000.0000 mL | INTRAVENOUS | Status: DC

## 2016-04-06 MED ORDER — TRIAMCINOLONE ACETONIDE 40 MG/ML IJ SUSP
INTRAMUSCULAR | Status: AC
  Administered 2016-04-06: 17:00:00
  Filled 2016-04-06: qty 1

## 2016-04-06 MED ORDER — TRIAMCINOLONE ACETONIDE 40 MG/ML IJ SUSP: 40.0000 mg | Freq: Once | INTRAMUSCULAR | Status: DC

## 2016-04-06 MED ORDER — IOPAMIDOL (ISOVUE-M 200) INJECTION 41%
INTRAMUSCULAR | Status: AC
  Filled 2016-04-06: qty 10

## 2016-04-06 MED ORDER — SODIUM CHLORIDE 0.9% FLUSH: 10.0000 mL | Freq: Once | INTRAVENOUS | Status: DC

## 2016-04-06 MED ORDER — MIDAZOLAM HCL 5 MG/5ML IJ SOLN
INTRAMUSCULAR | Status: AC
Start: 2016-04-06 — End: 2016-04-06
  Administered 2016-04-06: 0.5 mg
  Filled 2016-04-06: qty 5

## 2016-04-06 MED ORDER — ROPIVACAINE HCL 2 MG/ML IJ SOLN: 10.0000 mL | Freq: Once | INTRAMUSCULAR | Status: DC

## 2016-04-06 MED ORDER — LIDOCAINE HCL (CARDIAC) 20 MG/ML IV SOLN
INTRAVENOUS | Status: AC
  Administered 2016-04-06: 16:00:00
  Filled 2016-04-06: qty 5

## 2016-04-06 MED ORDER — IOPAMIDOL (ISOVUE-M 200) INJECTION 41%
INTRAMUSCULAR | Status: AC
  Administered 2016-04-06: 16:00:00
  Filled 2016-04-06: qty 10

## 2016-04-06 MED ORDER — ROPIVACAINE HCL 2 MG/ML IJ SOLN
INTRAMUSCULAR | Status: AC
  Administered 2016-04-06: 16:00:00
  Filled 2016-04-06: qty 10

## 2016-04-06 NOTE — Progress Notes (Signed)
Safety precautions to be maintained throughout the outpatient stay will include: orient to surroundings, keep bed in low position, maintain call bell within reach at all times, provide assistance with transfer out of bed and ambulation.  

## 2016-04-06 NOTE — Patient Instructions (Addendum)
Epidural Steroid Injection Patient Information  Description: The epidural space surrounds the nerves as they exit the spinal cord.  In some patients, the nerves can be compressed and inflamed by a bulging disc or a tight spinal canal (spinal stenosis).  By injecting steroids into the epidural space, we can bring irritated nerves into direct contact with a potentially helpful medication.  These steroids act directly on the irritated nerves and can reduce swelling and inflammation which often leads to decreased pain.  Epidural steroids may be injected anywhere along the spine and from the neck to the low back depending upon the location of your pain.   After numbing the skin with local anesthetic (like Novocaine), a small needle is passed into the epidural space slowly.  You may experience a sensation of pressure while this is being done.  The entire block usually last less than 10 minutes.  Conditions which may be treated by epidural steroids:   Low back and leg pain  Neck and arm pain  Spinal stenosis  Post-laminectomy syndrome  Herpes zoster (shingles) pain  Pain from compression fractures  Preparation for the injection:  1. Do not eat any solid food or dairy products within 8 hours of your appointment.  2. You may drink clear liquids up to 3 hours before appointment.  Clear liquids include water, black coffee, juice or soda.  No milk or cream please. 3. You may take your regular medication, including pain medications, with a sip of water before your appointment  Diabetics should hold regular insulin (if taken separately) and take 1/2 normal NPH dos the morning of the procedure.  Carry some sugar containing items with you to your appointment. 4. A driver must accompany you and be prepared to drive you home after your procedure.  5. Bring all your current medications with your. 6. An IV may be inserted and sedation may be given at the discretion of the physician.   7. A blood pressure  cuff, EKG and other monitors will often be applied during the procedure.  Some patients may need to have extra oxygen administered for a short period. 8. You will be asked to provide medical information, including your allergies, prior to the procedure.  We must know immediately if you are taking blood thinners (like Coumadin/Warfarin)  Or if you are allergic to IV iodine contrast (dye). We must know if you could possible be pregnant.  Possible side-effects:  Bleeding from needle site  Infection (rare, may require surgery)  Nerve injury (rare)  Numbness & tingling (temporary)  Difficulty urinating (rare, temporary)  Spinal headache ( a headache worse with upright posture)  Light -headedness (temporary)  Pain at injection site (several days)  Decreased blood pressure (temporary)  Weakness in arm/leg (temporary)  Pressure sensation in back/neck (temporary)  Call if you experience:  Fever/chills associated with headache or increased back/neck pain.  Headache worsened by an upright position.  New onset weakness or numbness of an extremity below the injection site  Hives or difficulty breathing (go to the emergency room)  Inflammation or drainage at the infection site  Severe back/neck pain  Any new symptoms which are concerning to you  Please note:  Although the local anesthetic injected can often make your back or neck feel good for several hours after the injection, the pain will likely return.  It takes 3-7 days for steroids to work in the epidural space.  You may not notice any pain relief for at least that one week.    If effective, we will often do a series of three injections spaced 3-6 weeks apart to maximally decrease your pain.  After the initial series, we generally will wait several months before considering a repeat injection of the same type.  If you have any questions, please call [phone] [facility]  Pain Management  Discharge Instructions  General Discharge Instructions :  If you need to reach your doctor call: Monday-Friday 8:00 am - 4:00 pm at [phone] or toll free [phone].  After clinic hours [phone] to have operator reach doctor.  Bring all of your medication bottles to all your appointments in the pain clinic.  To cancel or reschedule your appointment with Pain Management please remember to call 24 hours in advance to avoid a fee.  Refer to the educational materials which you have been given on: General Risks, I had my Procedure. Discharge Instructions, Post Sedation.  Post Procedure Instructions:  The drugs you were given will stay in your system until tomorrow, so for the next 24 hours you should not drive, make any legal decisions or drink any alcoholic beverages.  You may eat anything you prefer, but it is better to start with liquids then soups and crackers, and gradually work up to solid foods.  Please notify your doctor immediately if you have any unusual bleeding, trouble breathing or pain that is not related to your normal pain.  Depending on the type of procedure that was done, some parts of your body may feel week and/or numb.  This usually clears up by tonight or the next day.  Walk with the use of an assistive device or accompanied by an adult for the 24 hours.  You may use ice on the affected area for the first 24 hours.  Put ice in a Ziploc bag and cover with a towel and place against area 15 minutes on 15 minutes off.  You may switch to heat after 24 hours.Epidural Steroid Injection An epidural steroid injection is given to relieve pain in your neck, back, or legs that is caused by the irritation or swelling of a nerve root. This procedure involves injecting a steroid and numbing medicine (anesthetic) into the epidural space. The epidural space is the space between the outer covering of your spinal cord and the bones that form your backbone (vertebra).  LET [facility] CARE PROVIDER KNOW ABOUT:   Any allergies you have.  All medicines you are taking, including vitamins, herbs, eye drops, creams, and over-the-counter medicines such as aspirin.  Previous problems you or members of your family have had with the use of anesthetics.  Any blood disorders or blood clotting disorders you have.  Previous surgeries you have had.  Medical conditions you have. RISKS AND COMPLICATIONS Generally, this is a safe procedure. However, as with any procedure, complications can occur. Possible complications of epidural steroid injection include:  Headache.  Bleeding.  Infection.  Allergic reaction to the medicines.  Damage to your nerves. The response to this procedure depends on the underlying cause of the pain and its duration. People who have long-term (chronic) pain are less likely to benefit from epidural steroids than are those people whose pain comes on strong and suddenly. BEFORE THE PROCEDURE   Ask your health care provider about changing or stopping your regular medicines. You may be advised to stop taking blood-thinning medicines a few days before the procedure.  You may be given medicines to reduce anxiety.  Arrange for someone to  take you home after the procedure. PROCEDURE   You will remain awake during the procedure. You may receive medicine to make you relaxed.  You will be asked to lie on your stomach.  The injection site will be cleaned.  The injection site will be numbed with a medicine (local anesthetic).  A needle will be injected through your skin into the epidural space.  Your health care provider will use an X-ray machine to ensure that the steroid is delivered closest to the affected nerve. You may have minimal discomfort at this time.  Once the needle is in the right position, the local anesthetic and the steroid will be injected into the epidural space.  The needle will then be removed and a bandage will be applied to  the injection site. AFTER THE PROCEDURE   You may be monitored for a short time before you go home.  You may feel weakness or numbness in your arm or leg, which disappears within hours.  You may be allowed to eat, drink, and take your regular medicine.  You may have soreness at the site of the injection.   This information is not intended to replace advice given to you by your health care provider. Make sure you discuss any questions you have with your health care provider.   Document Released: 11/09/2007 Document Revised: 04/04/2013 Document Reviewed: 01/19/2013 Elsevier Interactive Patient Education 2016 [place]. Pain Management Discharge Instructions  General Discharge Instructions :  If you need to reach your doctor call: Monday-Friday 8:00 am - 4:00 pm at [phone] or toll free [phone].  After clinic hours [phone] to have operator reach doctor.  Bring all of your medication bottles to all your appointments in the pain clinic.  To cancel or reschedule your appointment with Pain Management please remember to call 24 hours in advance to avoid a fee.  Refer to the educational materials which you have been given on: General Risks, I had my Procedure. Discharge Instructions, Post Sedation.  Post Procedure Instructions:  The drugs you were given will stay in your system until tomorrow, so for the next 24 hours you should not drive, make any legal decisions or drink any alcoholic beverages.  You may eat anything you prefer, but it is better to start with liquids then soups and crackers, and gradually work up to solid foods.  Please notify your doctor immediately if you have any unusual bleeding, trouble breathing or pain that is not related to your normal pain.  Depending on the type of procedure that was done, some parts of your body may feel week and/or numb.  This usually clears up by tonight or the next day.  Walk with the use of an assistive device or  accompanied by an adult for the 24 hours.  You may use ice on the affected area for the first 24 hours.  Put ice in a Ziploc bag and cover with a towel and place against area 15 minutes on 15 minutes off.  You may switch to heat after 24 hours.

## 2016-04-07 ENCOUNTER — Telehealth: Payer: Self-pay | Admitting: *Deleted

## 2016-04-07 NOTE — Telephone Encounter (Signed)
No problems post procedure. 

## 2016-04-07 NOTE — Progress Notes (Signed)
Subjective:  Patient ID: Katherine [name], female    DOB: 14-Nov-1929  Age: 82 y.o. MRN: [id number]  CC: Back Pain (lower and runs down the legs to the toes)  Procedure today: Caudal epidural steroid No. 2 under fluoroscopic guidance and moderate sedation PROCEDURE last visit :  Caudal epidural steroid No. 1 of series with fluoroscopic guidance and moderate sedation  HPI Katherine [name] presents for a re- evaluation. She gained some relief with her last caudal epidural injection for approximately 3-5 days. She has had recurrence of the same quality pain that is approximately 75% of its previous level. No other changes in bowel or bladder function are noted. Her lower extremity strength and function have been stable. She's been taking her medications as prescribed with no reported problems. Unfortunately she did not stop her Plavix.   She has a long-standing history of diffuse body pain including low back pain. The pain she experiences rates at a VAS of 10 and best 7 but has gradually gotten worse to the point where she has soft hypotension. She has pain in the afternoon and night and with immobility. Aggravating factors include bending climbing kneeling motion sitting standing squatting and twisting. Alleviating factors include rest sleep lying down and medication management. In the past she has had epidural steroid injections which seem to help alleviate some of her low back pain.  Currently the pain complaints she experiences is similar to what she had before with associated tingling worse on the left posterior and lateral leg with associated weakness and pain that keeps her up at night. It is described as aching and cramping deep disabling. She has had an MRI which reveals multilevel facet degeneration in the lumbosacral spine and evidence of multilevel degenerative disc disease.  Since her last visit she reports no significant changes in her lower extremity strength or function.   She presents today for  a repeat caudal epidural steroid. She's been off her Plavix for 7 days.  History Katherine [name] has a past medical history of Arthritis; Chicken pox; Chronic combined systolic and diastolic heart failure (HCC); COPD (chronic obstructive pulmonary disease) ([place]); GERD (gastroesophageal reflux disease); Heart attack ([place]); Hyperlipidemia; Hypertension; Hypertensive heart disease; Ischemic cardiomyopathy; and Occlusive coronary artery disease requiring drug therapy.   She has a past surgical history that includes Shoulder surgery; Cardiac catheterization (N/A, 10/30/2015); Cardiac catheterization (N/A, 10/30/2015); Total hip arthroplasty (Right, 2012); Back surgery (2010); Amputation finger (Left, 1966); Appendectomy (1961); Tonsillectomy (1953); and Abdominal hysterectomy (1985).   Her family history includes Arthritis in her sister and sister; Breast cancer in her sister; CAD in her brother and sister; Heart disease in her brother, brother, brother, brother, brother, brother, mother, sister, sister, sister, and sister; Hyperlipidemia in her mother; Hypertension in her brother, brother, brother, brother, brother, brother, mother, sister, sister, sister, and sister; Lung cancer in her mother; Stroke in her mother, sister, sister, sister, and sister.She reports that she has never smoked. She has never used smokeless tobacco. She reports that she does not drink alcohol or use drugs.  No results found for this or any previous visit.  No results found for: TOXASSSELUR  Outpatient Medications Prior to Visit  Medication Sig Dispense Refill  . amLODipine (NORVASC) 2.5 MG tablet Take 1 tablet (2.5 mg total) by mouth daily. 90 tablet 3  . aspirin EC 81 MG tablet Take 81 mg by mouth daily.    Marland Kitchen atorvastatin (LIPITOR) 80 MG tablet Take 1 tablet (80 mg total) by mouth daily  at 6 PM. 90 tablet 3  . baclofen (LIORESAL) 10 MG tablet Take 1 tablet (10 mg total) by mouth 3 (three) times daily. (Patient taking differently: Take  10 mg by mouth 2 (two) times daily. ) 90 each 0  . Biotin 1 MG CAPS Take by mouth.    . carvedilol (COREG) 6.25 MG tablet Take 1 tablet (6.25 mg total) by mouth 2 (two) times daily with a meal. 180 tablet 3  . furosemide (LASIX) 20 MG tablet Take 1 tablet (20 mg total) by mouth every other day. 30 tablet 3  . gabapentin (NEURONTIN) 100 MG capsule Take 1 capsule (100 mg total) by mouth 3 (three) times daily. Take two capsules three times daily. 540 capsule 3  . HYDROcodone-acetaminophen (NORCO/VICODIN) 5-325 MG tablet Take 1 tablet by mouth every 4 (four) hours as needed for moderate pain. 60 tablet 0  . lisinopril (PRINIVIL,ZESTRIL) 20 MG tablet Take 1 tablet (20 mg total) by mouth daily. 90 tablet 3  . multivitamin-iron-minerals-folic acid (CENTRUM) chewable tablet Chew 1 tablet by mouth daily.    . nitroGLYCERIN (NITROSTAT) 0.4 MG SL tablet Place 1 tablet (0.4 mg total) under the tongue every 5 (five) minutes as needed for chest pain. 25 tablet 3  . omega-3 acid ethyl esters (LOVAZA) 1 g capsule Take 1 g by mouth daily.    . pantoprazole (PROTONIX) 40 MG tablet Take 1 tablet (40 mg total) by mouth daily. 30 tablet 3  . sertraline (ZOLOFT) 100 MG tablet Take 1 tablet (100 mg total) by mouth daily. 90 tablet 3  . thiamine (VITAMIN B-1) 100 MG tablet Take 100 mg by mouth daily.    . Vitamin D, Ergocalciferol, (DRISDOL) 50000 units CAPS capsule Take 50,000 Units by mouth every 7 (seven) days.     Facility-Administered Medications Prior to Visit  Medication Dose Route Frequency Provider Last Rate Last Dose  . iopamidol (ISOVUE-M) 41 % intrathecal injection 20 mL  20 mL Other Once PRN Molli Barrows, MD      . lactated ringers infusion 1,000 mL  1,000 mL Intravenous Continuous Molli Barrows, MD      . lidocaine (PF) (XYLOCAINE) 1 % injection 5 mL  5 mL Subcutaneous Once Molli Barrows, MD      . midazolam (VERSED) injection 5 mg  5 mg Intravenous Once Molli Barrows, MD      . ropivacaine (PF) 2 mg/ml  (0.2%) (NAROPIN) epidural 10 mL  10 mL Epidural Once Molli Barrows, MD      . sodium chloride flush (NS) 0.9 % injection 10 mL  10 mL Other Once Molli Barrows, MD      . triamcinolone acetonide (KENALOG-40) injection 40 mg  40 mg Other Once Molli Barrows, MD       Lab Results  Component Value Date   WBC 7.7 01/08/2016   HGB 12.8 01/08/2016   HCT 38.4 01/08/2016   PLT 192 01/08/2016   GLUCOSE 145 (H) 01/08/2016   CHOL 114 12/16/2015   TRIG 126 12/16/2015   HDL 41 12/16/2015   LDLCALC 48 12/16/2015   ALT 25 01/07/2016   AST 36 01/07/2016   NA 141 01/08/2016   K 3.9 01/08/2016   CL 109 01/08/2016   CREATININE 0.92 01/08/2016   BUN 21 (H) 01/08/2016   CO2 26 01/08/2016   TSH 0.345 (L) 10/30/2015   INR 1.12 10/30/2015   HGBA1C 6.0 (H) 10/30/2015    --------------------------------------------------------------------------------------------------------------------- Dg Knee 1-2  Views Left  01/07/2016  CLINICAL DATA:  Left anterior knee pain beginning today.  No injury. EXAM: LEFT KNEE - 1-2 VIEW COMPARISON:  None. FINDINGS: No evidence of fracture, dislocation, or joint effusion. Chondrocalcinosis. No notable degenerative changes for age. Osteopenia. IMPRESSION: No acute finding. Electronically Signed   By: Monte Fantasia M.D.   On: 01/07/2016 20:00   Mr Lumbar Spine Wo Contrast  01/08/2016  CLINICAL DATA:  Low back pain with left thigh pain radiating down to the left foot EXAM: MRI LUMBAR SPINE WITHOUT CONTRAST TECHNIQUE: Multiplanar, multisequence MR imaging of the lumbar spine was performed. No intravenous contrast was administered. COMPARISON:  None. FINDINGS: Segmentation:  Standard. Alignment: 8 mm of anterolisthesis of L4 on L5 secondary to bilateral L4 pars interarticularis defects. The alignment is otherwise anatomic. Vertebrae:  No fracture, evidence of discitis, or bone lesion. Conus medullaris: Extends to the T12-L1 level and appears normal. Paraspinal and other soft tissues:  Negative. Disc levels: Disc spaces: Degenerative disc disease with disc height loss at L3-4, L4-5 and L5-S1. T11-12: Mild broad-based disc bulge. Mild bilateral facet arthropathy. No foraminal or central canal stenosis. T12-L1: No significant disc bulge. No evidence of neural foraminal stenosis. No central canal stenosis. L1-L2: Mild broad-based disc bulge. Mild bilateral facet arthropathy. No evidence of neural foraminal stenosis. No central canal stenosis. L2-L3: Mild broad-based disc bulge. Moderate right facet arthropathy. Mild right foraminal stenosis. Right lateral recess stenosis. No central canal stenosis. L3-L4: Mild broad-based disc bulge. Mild bilateral facet arthropathy. Mild right foraminal stenosis. Right lateral recess stenosis. No left foraminal stenosis. No central canal stenosis. L4-L5: No disc protrusion. Grade 1 anterolisthesis of L4 on L5. Severe bilateral facet arthropathy and bilateral L4 pars interarticularis defects. Severe bilateral foraminal stenosis. Mild spinal stenosis. L5-S1: Mild broad-based disc bulge. Mild bilateral facet arthropathy. Mild left foraminal stenosis. No central canal stenosis. IMPRESSION: 1. At L4-5 there is grade 1 anterolisthesis of L4 on L5. Severe bilateral facet arthropathy and bilateral L4 pars interarticularis defects. Severe bilateral foraminal stenosis. Mild spinal stenosis. 2. At L2-3 there is a mild broad-based disc bulge. Moderate right facet arthropathy. Mild right foraminal stenosis. Right lateral recess stenosis. 3. At L3-4 there is a mild Mild broad-based disc bulge. Mild bilateral facet arthropathy. Mild right foraminal stenosis. Right lateral recess stenosis. Electronically Signed   By: Kathreen Devoid   On: 01/08/2016 09:27   Ct Cta Abd/pel W/cm &/or W/o Cm  01/07/2016  ADDENDUM REPORT: 01/07/2016 19:54 ADDENDUM: The there are a few small bilateral sub cm renal cortical hypodensities too small to characterize but likely cysts. Electronically Signed    By: Marin Olp M.D.   On: 01/07/2016 19:54  01/07/2016  CLINICAL DATA:  Sciatica.  Possible dehydration. EXAM: CTA ABDOMEN AND PELVIS wITHOUT AND WITH CONTRAST TECHNIQUE: Multidetector CT imaging of the abdomen and pelvis was performed using the standard protocol during bolus administration of intravenous contrast. Multiplanar reconstructed images and MIPs were obtained and reviewed to evaluate the vascular anatomy. CONTRAST:  100 mL Isovue 370 IV COMPARISON:  None. FINDINGS: Lung bases are within normal. Abdominal images demonstrate multiple splenic granulomas. The liver, pancreas, gallbladder and adrenal glands are normal. Kidneys are normal in size without hydronephrosis or nephrolithiasis. There is a 2.3 cm cyst over the mid pole the right kidney. Ureters are within normal. There is mild calcified plaque over the abdominal aorta iliac arteries. Mild focal stenosis at the takeoff of the celiac axis for comparison Appendix is not well visualized. Minimal diverticulosis  of the colon which is otherwise within normal. Small bowel is within normal. Mesentery is unremarkable without inflammatory change or free fluid. Pelvic images demonstrate mild bladder distension. Surgical absence of the uterus. No free pelvic fluid. Right hip arthroplasty intact. There are degenerative changes of the spine with multilevel disc disease over the lumbosacral spine mean grade 1-2 anterolisthesis of L4 on L5. Previous L3-L5 laminectomy. Several metallic densities over the inguinal regions. Review of the MIP images confirms the above findings. IMPRESSION: No acute findings in the abdomen/ pelvis. 2.3 cm right renal cyst. Minimal diverticulosis of the colon. Significant degenerative changes with multilevel disc disease over the lumbosacral spine. Grade 1-2 anterolisthesis L4 on L5. Electronically Signed: By: Marin Olp M.D. On: 01/07/2016 18:26        ---------------------------------------------------------------------------------------------------------------------- Past Medical History:  Diagnosis Date  . Arthritis   . Chicken pox   . Chronic combined systolic and diastolic heart failure (HCC)    EF 35-40%, Akinesis of mid-apicalanteroseptal wall & AK of apex, Gr 1DD.  Marland Kitchen COPD (chronic obstructive pulmonary disease) ([place])    worked in a [employer]  . GERD (gastroesophageal reflux disease)   . Heart attack ([place])    4/17  . Hyperlipidemia   . Hypertension   . Hypertensive heart disease   . Ischemic cardiomyopathy   . Occlusive coronary artery disease requiring drug therapy    a. Remote MI - never sought treatment when it occurred, shows up old on EKG;  b. Cath 10/2015 - 100% pLAD CTO with 80% D1, prox Cx 60% - EF ~35%    Past Surgical History:  Procedure Laterality Date  . ABDOMINAL HYSTERECTOMY  1985  . AMPUTATION FINGER Left 1966   ring finger  . APPENDECTOMY  1961  . BACK SURGERY  2010   lower back  . CARDIAC CATHETERIZATION N/A 10/30/2015   Procedure: Right/Left Heart Cath and Coronary Angiography;  Surgeon: Wellington Hampshire, MD;  Location: [facility];  Service: Cardiovascular;  Laterality: N/A;  . CARDIAC CATHETERIZATION N/A 10/30/2015   Procedure: Intravascular Pressure Wire/FFR Study;  Surgeon: Wellington Hampshire, MD;  Location: [facility];  Service: Cardiovascular;  Laterality: N/A;  . SHOULDER SURGERY    . TONSILLECTOMY  1953  . TOTAL HIP ARTHROPLASTY Right 2012    Family History  Problem Relation Age of Onset  . Heart disease Mother   . Stroke Mother   . Hypertension Mother   . Lung cancer Mother   . Hyperlipidemia Mother   . CAD Sister   . Heart disease Sister   . Stroke Sister   . Hypertension Sister   . Arthritis Sister   . CAD Brother   . Heart disease Brother   . Hypertension Brother   . Heart disease Sister   . Stroke Sister   . Hypertension Sister   . Arthritis Sister   .  Heart disease Sister   . Stroke Sister   . Hypertension Sister   . Breast cancer Sister   . Heart disease Sister   . Stroke Sister   . Hypertension Sister   . Heart disease Brother   . Hypertension Brother   . Heart disease Brother   . Hypertension Brother   . Heart disease Brother   . Hypertension Brother   . Heart disease Brother   . Hypertension Brother   . Heart disease Brother   . Hypertension Brother     Social History  Substance Use Topics  . Smoking  status: Never Smoker  . Smokeless tobacco: Never Used  . Alcohol use No    ---------------------------------------------------------------------------------------------------------------------- Social History   Social History  . Marital status: Single    Spouse name: N/A  . Number of children: N/A  . Years of education: N/A   Social History Main Topics  . Smoking status: Never Smoker  . Smokeless tobacco: Never Used  . Alcohol use No  . Drug use: No  . Sexual activity: No   Other Topics Concern  . None   Social History Narrative  . None    Scheduled Meds: Continuous Infusions: PRN Meds:.   BP (!) 168/68   Pulse 62   Temp 98.1 F (36.7 C)   Resp 16   Ht 5\' 1"  (1.549 m)   Wt 136 lb (61.7 kg)   SpO2 98%   BMI 25.70 kg/m    BP Readings from Last 3 Encounters:  04/06/16 (!) 168/68  04/05/16 120/60  03/30/16 (!) 142/51     Wt Readings from Last 3 Encounters:  04/06/16 136 lb (61.7 kg)  04/05/16 136 lb 12 oz (62 kg)  03/30/16 138 lb (62.6 kg)     ----------------------------------------------------------------------------------------------------------------------  ROS Review of Systems  Cardiac: History of A. fib on a daily aspirin taking Plavix this was DC'd approximately 10 days ago. Also high blood pressure and history of heart attack in March 2017. History of congestive heart failure previous heart catheterization. Pulmonary: Lung problems with history of shortness of  breath Neurologic: Scoliosis and chronic low back pain Psychologic: Negative GI reflux Hematologic easy bruising and bleeding Rheumatologic history of osteoarthritis fibromyalgia and chronic fatigue syndrome and rheumatoid arthritis  Objective:  BP (!) 168/68   Pulse 62   Temp 98.1 F (36.7 C)   Resp 16   Ht 5\' 1"  (1.549 m)   Wt 136 lb (61.7 kg)   SpO2 98%   BMI 25.70 kg/m   Physical Exam Pupils are equally round reactive to light with extraocular muscles intact Patient is a good historian appropriate cooperative compliant Heart is irregular with appropriate right Lungs are clear to also dictation without rales or wheezes Inspection low back reveals No change from previous evaluation   Assessment & Plan:   Katherine [name] was seen today for back pain.  Diagnoses and all orders for this visit:  Spinal stenosis of lumbar region -     triamcinolone acetonide (KENALOG-40) injection 40 mg; 1 mL (40 mg total) by Other route once. -     sodium chloride flush (NS) 0.9 % injection 10 mL; 10 mLs by Other route once. -     ropivacaine (PF) 2 mg/ml (0.2%) (NAROPIN) epidural 10 mL; 10 mLs by Epidural route once. -     midazolam (VERSED) injection 5 mg; Inject 5 mLs (5 mg total) into the vein once. -     lidocaine (PF) (XYLOCAINE) 1 % injection 5 mL; Inject 5 mLs into the skin once. -     lactated ringers infusion 1,000 mL; Inject 1,000 mLs into the vein continuous. -     Lumbar Epidural Injection; Future  Failed back syndrome of lumbar spine  Other orders -     iopamidol (ISOVUE-M) 41 % intrathecal injection;  -     sodium chloride 0.9 % injection;  -     ropivacaine (PF) 2 mg/ml (0.2%) (NAROPIN) 2 MG/ML epidural;  -     lidocaine (cardiac) 100 mg/49ml (XYLOCAINE) 20 MG/ML injection 2%;  -     midazolam (  VERSED) 5 MG/5ML injection;  -     triamcinolone acetonide (KENALOG-40) 40 MG/ML injection;  -     iopamidol (ISOVUE-M) 41 % intrathecal injection;       ----------------------------------------------------------------------------------------------------------------------  Problem List Items Addressed This Visit    None    Visit Diagnoses    Spinal stenosis of lumbar region    -  Primary   Relevant Medications   triamcinolone acetonide (KENALOG-40) injection 40 mg   sodium chloride flush (NS) 0.9 % injection 10 mL   ropivacaine (PF) 2 mg/ml (0.2%) (NAROPIN) epidural 10 mL   midazolam (VERSED) injection 5 mg   lidocaine (PF) (XYLOCAINE) 1 % injection 5 mL   lactated ringers infusion 1,000 mL   Other Relevant Orders   Lumbar Epidural Injection   Failed back syndrome of lumbar spine          ----------------------------------------------------------------------------------------------------------------------  1. Spinal stenosis of lumbar region  We will proceed with a caudal epidural steroid injection today. The risks benefits once again reviewed. All questions answered. She's been off her Plavix for the appropriate period of time. She is to restart her Plavix tomorrow. Return to clinic in 2 months and stop Plavix 7 days prior to that for her next injection.  2. Failed back syndrome of lumbar spine  After informed consent was obtained and the risks benefits reviewed patient chose to pursue a caudal epidural steroid injection today. With the patient in the prone position and an IV in place, sedation was with IV versed 0.5mg .. Using AP and lateral fluoroscopic guidance I identified the area overlying the sacral hiatus. This area was broadly prepped with DuraPrep 3 and we utilized strict aseptic technique during the procedure. 1% lidocaine was infiltrated 2 cc using a 25-gauge needle overlying the sacral cornu and then using lateral fluoroscopic guidance I advanced an 18-gauge Touhy needle approximately 2 cm through the sacral hiatus. Confirmation was with 2 cc of Isovue yielding good epidural spread and no evidence of IV or  subarachnoid uptake. This was followed by an injection of 5 cc of saline mixed with 1 cc of ropivacaine 0.2% and 40 mg of triamcinolone. This was tolerated without difficulty the patient was convalesced and discharged home in stable condition for follow-up as mentioned. JA    ----------------------------------------------------------------------------------------------------------------------  I am having Katherine [name] maintain her aspirin EC, nitroGLYCERIN, atorvastatin, furosemide, omega-3 acid ethyl esters, amLODipine, carvedilol, gabapentin, lisinopril, sertraline, pantoprazole, baclofen, multivitamin-iron-minerals-folic acid, Biotin, Vitamin D (Ergocalciferol), thiamine, and HYDROcodone-acetaminophen. We administered sodium chloride, ropivacaine (PF) 2 mg/ml (0.2%), lidocaine (cardiac) 100 mg/66ml, midazolam, triamcinolone acetonide, and iopamidol. We will continue to administer triamcinolone acetonide, sodium chloride flush, ropivacaine (PF) 2 mg/ml (0.2%), midazolam, lidocaine (PF), lactated ringers, iopamidol, triamcinolone acetonide, sodium chloride flush, ropivacaine (PF) 2 mg/ml (0.2%), midazolam, lidocaine (PF), and lactated ringers.   Meds ordered this encounter  Medications  . iopamidol (ISOVUE-M) 41 % intrathecal injection    Donneta Romberg, Dena: cabinet override  . sodium chloride 0.9 % injection    Donneta Romberg, Dena: cabinet override  . ropivacaine (PF) 2 mg/ml (0.2%) (NAROPIN) 2 MG/ML epidural    Donneta Romberg, Dena: cabinet override  . lidocaine (cardiac) 100 mg/75ml (XYLOCAINE) 20 MG/ML injection 2%    Donneta Romberg, Dena: cabinet override  . midazolam (VERSED) 5 MG/5ML injection    Donneta Romberg, Dena: cabinet override  . triamcinolone acetonide (KENALOG-40) 40 MG/ML injection    Donneta Romberg, Dena: cabinet override  . iopamidol (ISOVUE-M) 41 % intrathecal injection    Donneta Romberg, Dena: cabinet override  . triamcinolone  acetonide (KENALOG-40) injection 40 mg  . sodium chloride flush (NS) 0.9 % injection  10 mL  . ropivacaine (PF) 2 mg/ml (0.2%) (NAROPIN) epidural 10 mL  . midazolam (VERSED) injection 5 mg  . lidocaine (PF) (XYLOCAINE) 1 % injection 5 mL  . lactated ringers infusion 1,000 mL   Follow-up: Return in about 2 months (around 06/06/2016) for evaluation, procedure.    Molli Barrows, MD  This dictation was performed utilizing Dragon voice recognition software.  Please excuse any unintentional or mistaken typographical errors as a result of its unedited utilization.

## 2016-04-08 ENCOUNTER — Ambulatory Visit: Payer: Medicare HMO | Admitting: Cardiovascular Disease

## 2016-04-29 ENCOUNTER — Other Ambulatory Visit: Payer: Self-pay

## 2016-05-12 ENCOUNTER — Telehealth: Payer: Self-pay | Admitting: *Deleted

## 2016-05-12 MED ORDER — PANTOPRAZOLE SODIUM 40 MG PO TBEC: 40.0000 mg | DELAYED_RELEASE_TABLET | Freq: Every day | ORAL | 2 refills | Status: DC

## 2016-05-12 NOTE — Telephone Encounter (Signed)
rx sent

## 2016-05-12 NOTE — Telephone Encounter (Signed)
Patient has requested to have a 90 day supply for pantoprazole  Pharmacy [employer]

## 2016-05-24 DIAGNOSIS — Z85828 Personal history of other malignant neoplasm of skin: Secondary | ICD-10-CM | POA: Diagnosis not present

## 2016-05-24 DIAGNOSIS — T814XXA Infection following a procedure, initial encounter: Secondary | ICD-10-CM | POA: Diagnosis not present

## 2016-05-24 DIAGNOSIS — L570 Actinic keratosis: Secondary | ICD-10-CM | POA: Diagnosis not present

## 2016-05-24 DIAGNOSIS — L821 Other seborrheic keratosis: Secondary | ICD-10-CM | POA: Diagnosis not present

## 2016-05-25 ENCOUNTER — Telehealth: Payer: Self-pay | Admitting: Family Medicine

## 2016-05-25 NOTE — Telephone Encounter (Signed)
Pt called about having a problem with the medication. Pt was told that her insurance told her she should not be taking the medication of gabapentin (NEURONTIN) 100 MG capsule and sertraline (ZOLOFT) 100 MG tablet due to real bad side effects. Please advise? And pt stated that she should take medication of furosemide (LASIX) 20 MG tablet every day and that Dr Lacinda Axon will have to call and change to every other day.   Call pt @ [phone]. Thank you!

## 2016-05-25 NOTE — Telephone Encounter (Signed)
Please advise 

## 2016-05-25 NOTE — Telephone Encounter (Signed)
I did not start these medication but she should continue her medications as prescribed. Regarding her lasix, discuss with cardiology.

## 2016-05-26 ENCOUNTER — Telehealth: Payer: Self-pay | Admitting: Cardiovascular Disease

## 2016-05-26 NOTE — Telephone Encounter (Addendum)
Patient states that she received some papers from pharmacy regarding her gabapentin and she states that it mentioned memory loss and she does not want to take it anymore. She reports that she is going to stop this medication and I told her she should not abruptly stop this medication without first talking to her primary care physician who prescribes this. Instructed her to call her primary care physician and speak with them before stopping it. She verbalized understanding and had no further questions at this time.

## 2016-05-26 NOTE — Telephone Encounter (Signed)
Pt asks if she should stop taking Gabapentin. She has read it causes memory loss. She states she knows Dr. Fletcher Anon did not prescribe this, but cannot get into contact with the Dr. That prescribed it. Please call.

## 2016-05-26 NOTE — Telephone Encounter (Signed)
Pt told to continue medication has prescribed. Pt was told to talk to cardiology about her lasix since she stated she was taking it every other day.

## 2016-05-28 DIAGNOSIS — R55 Syncope and collapse: Secondary | ICD-10-CM | POA: Diagnosis not present

## 2016-05-28 DIAGNOSIS — R9431 Abnormal electrocardiogram [ECG] [EKG]: Secondary | ICD-10-CM | POA: Diagnosis not present

## 2016-05-28 DIAGNOSIS — Z79899 Other long term (current) drug therapy: Secondary | ICD-10-CM | POA: Diagnosis not present

## 2016-05-28 DIAGNOSIS — N189 Chronic kidney disease, unspecified: Secondary | ICD-10-CM | POA: Diagnosis not present

## 2016-05-28 DIAGNOSIS — I129 Hypertensive chronic kidney disease with stage 1 through stage 4 chronic kidney disease, or unspecified chronic kidney disease: Secondary | ICD-10-CM | POA: Diagnosis not present

## 2016-05-29 DIAGNOSIS — R55 Syncope and collapse: Secondary | ICD-10-CM | POA: Diagnosis not present

## 2016-05-29 DIAGNOSIS — N189 Chronic kidney disease, unspecified: Secondary | ICD-10-CM | POA: Diagnosis not present

## 2016-05-29 DIAGNOSIS — I129 Hypertensive chronic kidney disease with stage 1 through stage 4 chronic kidney disease, or unspecified chronic kidney disease: Secondary | ICD-10-CM | POA: Diagnosis not present

## 2016-05-29 DIAGNOSIS — R9431 Abnormal electrocardiogram [ECG] [EKG]: Secondary | ICD-10-CM | POA: Diagnosis not present

## 2016-05-29 DIAGNOSIS — Z79899 Other long term (current) drug therapy: Secondary | ICD-10-CM | POA: Diagnosis not present

## 2016-06-01 ENCOUNTER — Telehealth: Payer: Self-pay | Admitting: Family Medicine

## 2016-06-01 ENCOUNTER — Other Ambulatory Visit: Payer: Self-pay | Admitting: Family Medicine

## 2016-06-01 MED ORDER — PREGABALIN 75 MG PO CAPS: 75.0000 mg | ORAL_CAPSULE | Freq: Two times a day (BID) | ORAL | 0 refills | Status: DC

## 2016-06-01 NOTE — Telephone Encounter (Signed)
  Pt wants to switch to lyrica due to her son being his had good results.

## 2016-06-01 NOTE — Telephone Encounter (Signed)
Chart states that she is on 200 mg TID. Would decrease to 100 TID x 1 week, then stop.

## 2016-06-01 NOTE — Telephone Encounter (Signed)
Advice

## 2016-06-01 NOTE — Telephone Encounter (Signed)
Pt called about wanting to stop taking the gabapentin (NEURONTIN) 100 MG capsule. Pt states it can affect memory. Pt wants to know how to stop taking the medication? Please advise?  Call pt @ [phone]. Thank you!

## 2016-06-01 NOTE — Telephone Encounter (Signed)
[facility] [place] [facility].

## 2016-06-02 NOTE — Telephone Encounter (Signed)
faxed

## 2016-06-02 NOTE — Telephone Encounter (Signed)
Rx was sent  

## 2016-06-08 ENCOUNTER — Telehealth: Payer: Self-pay

## 2016-06-08 NOTE — Telephone Encounter (Signed)
PA for Lyrica.

## 2016-06-09 ENCOUNTER — Telehealth: Payer: Self-pay | Admitting: Family Medicine

## 2016-06-09 NOTE — Telephone Encounter (Signed)
Advised pt of below. Pt wants to know what she can do in the meantime. She is not sleeping and does not feel like eating, and she states that she is feeling jittery. She states that she is scared to go to a doctor in [place] because of her hurt. Please advise, thank you!

## 2016-06-09 NOTE — Telephone Encounter (Signed)
Pt called and stated that she is feeling anxious and shaky. She was wondering if Dr. Lacinda Axon could prescribe something for her, she spoke with a nurse and she recommended Alprazolam. Pt is currently in [place] with her son and will not be back until after Christmas. Please advise, thank you!  [id number] [place], [address] [place]  Call pt @ [phone]

## 2016-06-09 NOTE — Telephone Encounter (Signed)
Cant do this across state lines. Also I would not recommend Alprazolam.

## 2016-06-09 NOTE — Telephone Encounter (Signed)
Please advise pt

## 2016-06-09 NOTE — Telephone Encounter (Signed)
We cant prescribe without seeing her. If she is staying in [place] or staying there for a while she will need to be seen there due to [place] not being about to send medication over state lines.

## 2016-06-10 IMAGING — CT CT CTA ABD/PEL W/CM AND/OR W/O CM
3 of 9 series · 12 of 46 positions shown, 18 images · IV contrast (APPLIED)
Comparison: None.

ADDENDUM:
The there are a few small bilateral sub cm renal cortical
hypodensities too small to characterize but likely cysts.
CLINICAL DATA: Sciatica.  Possible dehydration.

EXAM:
CTA ABDOMEN AND PELVIS wITHOUT AND WITH CONTRAST
TECHNIQUE: Multidetector CT imaging of the abdomen and pelvis was performed
using the standard protocol during bolus administration of
intravenous contrast. Multiplanar reconstructed images and MIPs were
obtained and reviewed to evaluate the vascular anatomy.
CONTRAST:  100 mL Isovue 370 IV

[Series 4: arterial · axial · arterial · 0.70mm/px · z∈[+304,+458]mm · 4 of 220 slices shown]
[im 22/220  soft-tissue]
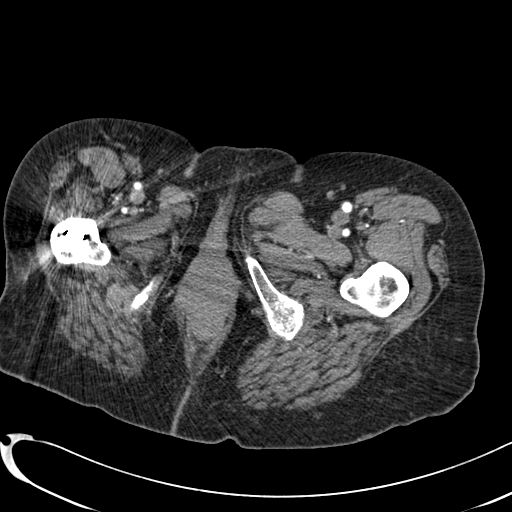
[im 44/220  soft-tissue]
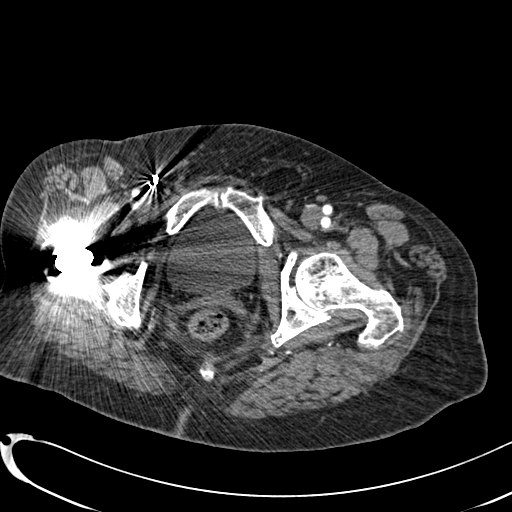
[im 66/220  soft-tissue]
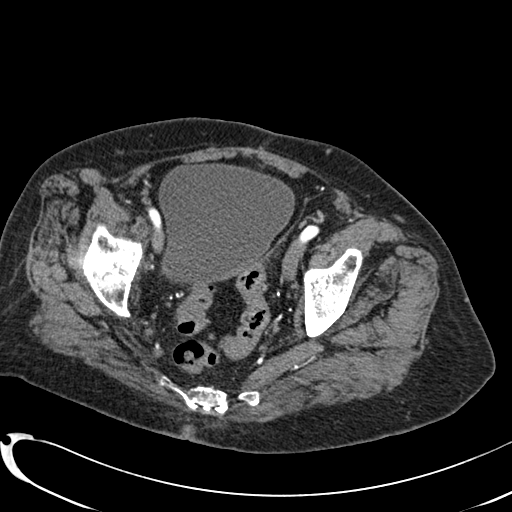
[im 99/220  soft-tissue]
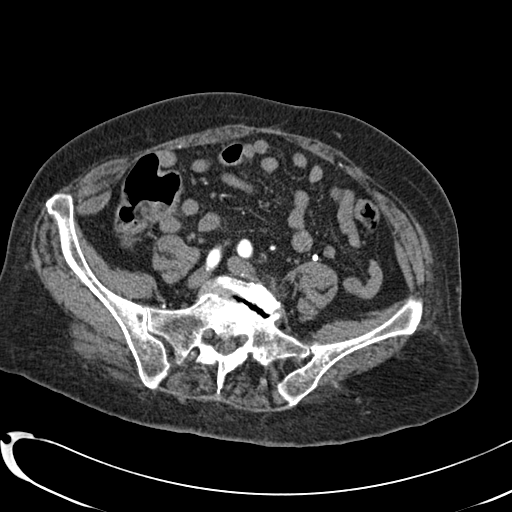

[Series 6: venous · axial · portal-venous · 0.70mm/px · z∈[+326,+636]mm · 6 of 88 slices shown, 11 images]
[im 13/88  soft-tissue]
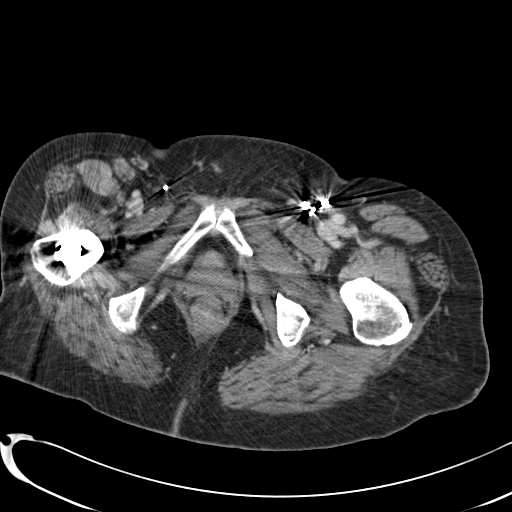
[im 13/88  bone]
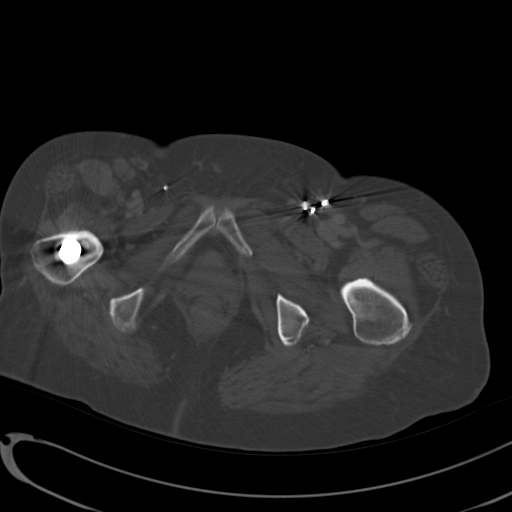
[im 25/88  soft-tissue]
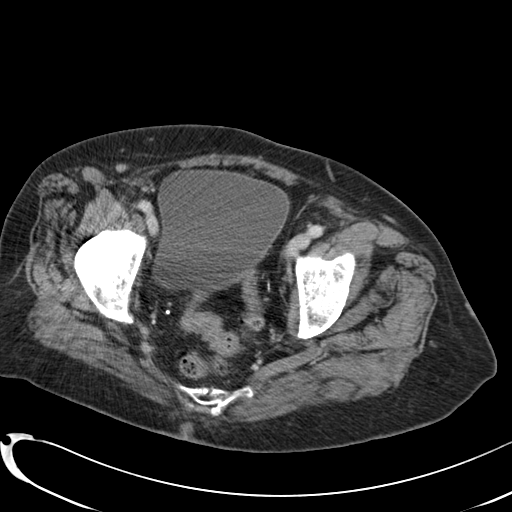
[im 38/88  soft-tissue]
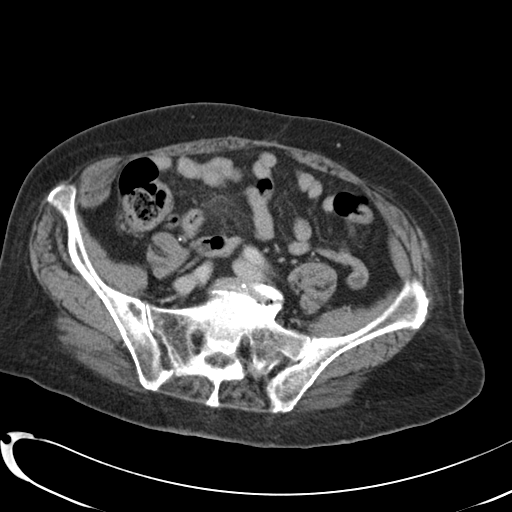
[im 38/88  lung]
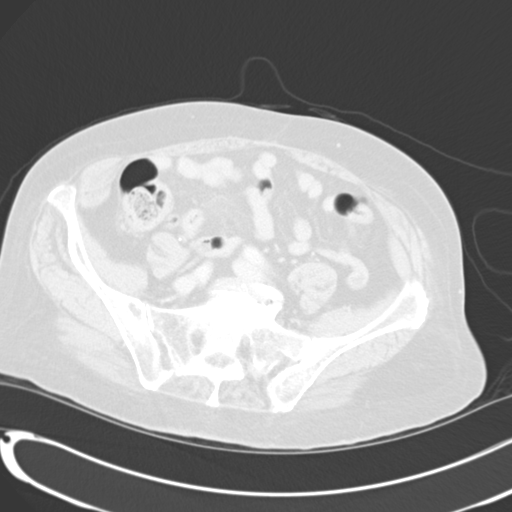
[im 50/88  soft-tissue]
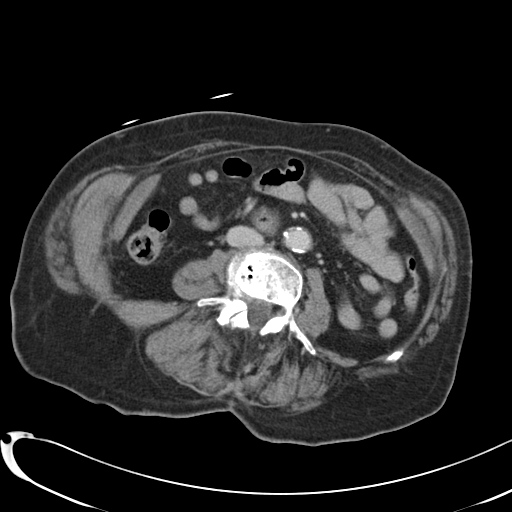
[im 50/88  lung]
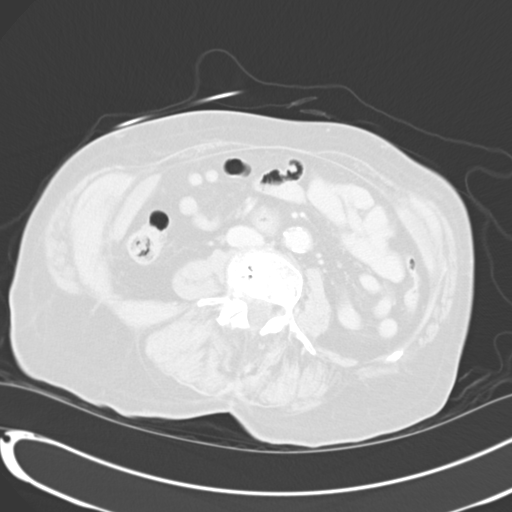
[im 63/88  soft-tissue]
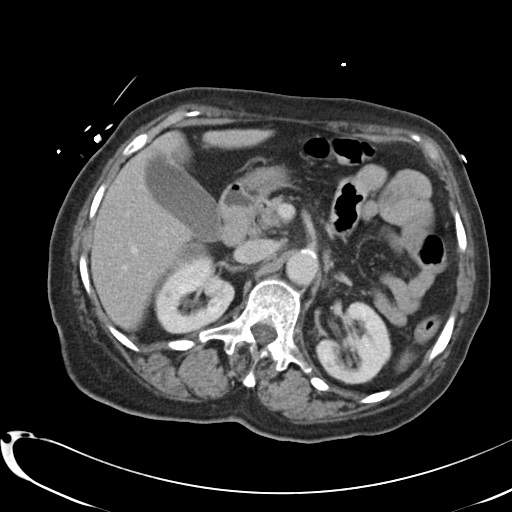
[im 63/88  lung]
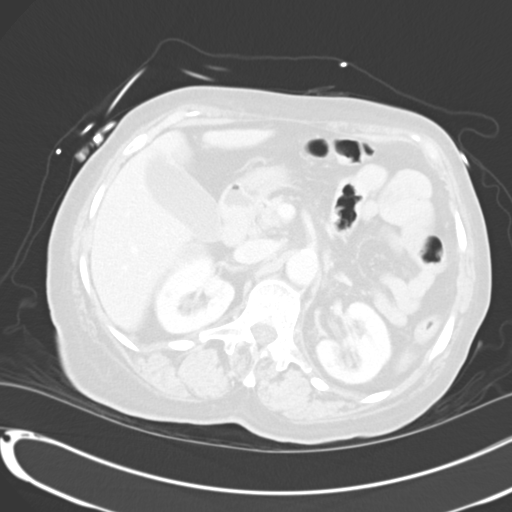
[im 75/88  soft-tissue]
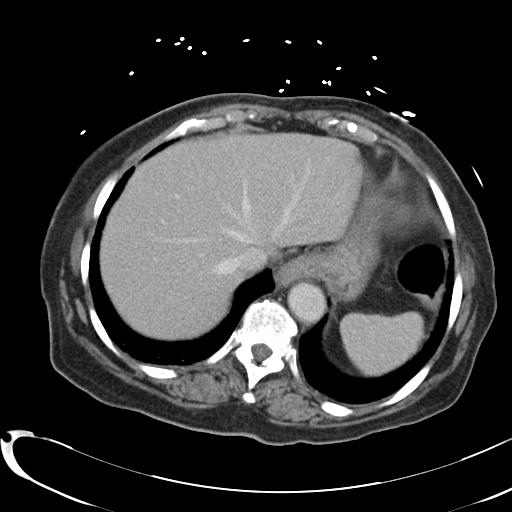
[im 75/88  lung]
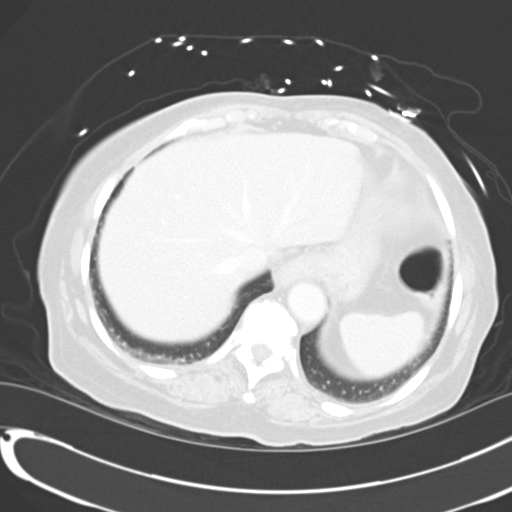

[Series 13: cor venous mpr · coronal · portal-venous · 0.64mm/px · 2 of 175 slices shown, 3 images]
[im 59/175  soft-tissue]
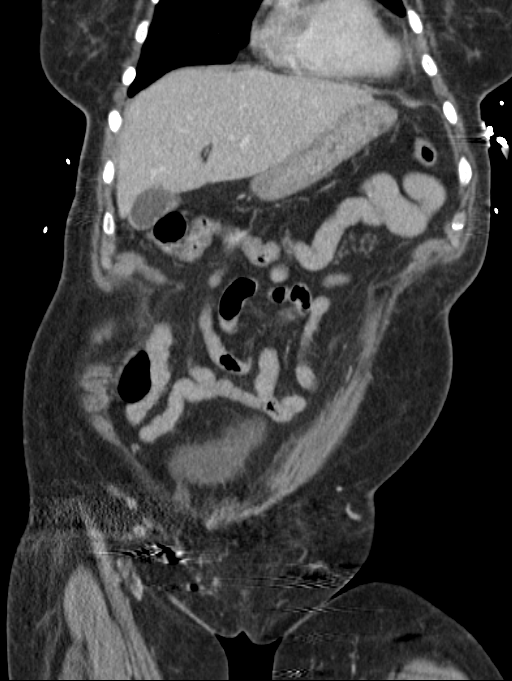
[im 59/175  bone]
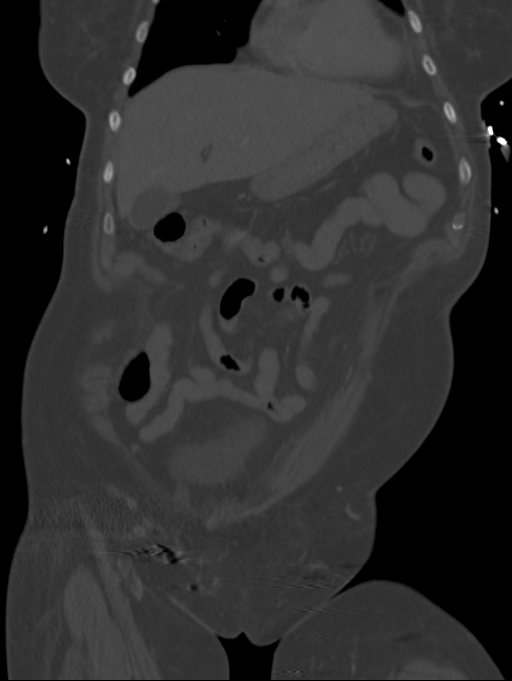
[im 117/175  soft-tissue]
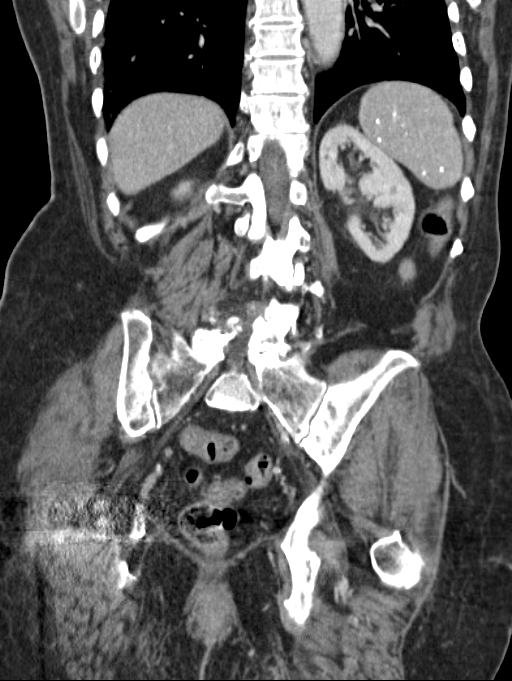

[12 of 46 positions shown; findings below may reference images not displayed]

FINDINGS: Lung bases are within normal.

Abdominal images demonstrate multiple splenic granulomas. The liver,
pancreas, gallbladder and adrenal glands are normal.

Kidneys are normal in size without hydronephrosis or
nephrolithiasis. There is a 2.3 cm cyst over the mid pole the right
kidney. Ureters are within normal.

There is mild calcified plaque over the abdominal aorta iliac
arteries. Mild focal stenosis at the takeoff of the celiac axis for
comparison

Appendix is not well visualized. Minimal diverticulosis of the colon
which is otherwise within normal. Small bowel is within normal.
Mesentery is unremarkable without inflammatory change or free fluid.

Pelvic images demonstrate mild bladder distension. Surgical absence
of the uterus. No free pelvic fluid. Right hip arthroplasty intact.

There are degenerative changes of the spine with multilevel disc
disease over the lumbosacral spine mean grade 1-2 anterolisthesis of
L4 on L5. Previous L3-L5 laminectomy. Several metallic densities
over the inguinal regions.

Review of the MIP images confirms the above findings.
IMPRESSION: No acute findings in the abdomen/ pelvis.

2.3 cm right renal cyst.

Minimal diverticulosis of the colon.

Significant degenerative changes with multilevel disc disease over
the lumbosacral spine. Grade 1-2 anterolisthesis L4 on L5.

## 2016-06-21 DIAGNOSIS — H04123 Dry eye syndrome of bilateral lacrimal glands: Secondary | ICD-10-CM | POA: Diagnosis not present

## 2016-06-21 DIAGNOSIS — H26493 Other secondary cataract, bilateral: Secondary | ICD-10-CM | POA: Diagnosis not present

## 2016-07-06 ENCOUNTER — Ambulatory Visit: Payer: Medicare HMO | Admitting: Cardiovascular Disease

## 2016-07-19 ENCOUNTER — Telehealth: Payer: Self-pay | Admitting: *Deleted

## 2016-07-19 NOTE — Telephone Encounter (Signed)
Patient must have eval for documentation prior to a procedure visit for insurance approval. Patient to schedule a Jan. Appointment.

## 2016-07-29 ENCOUNTER — Other Ambulatory Visit: Payer: Self-pay | Admitting: Family Medicine

## 2016-07-29 MED ORDER — PREGABALIN 75 MG PO CAPS: 75.0000 mg | ORAL_CAPSULE | Freq: Two times a day (BID) | ORAL | 0 refills | Status: DC

## 2016-07-29 NOTE — Telephone Encounter (Signed)
Cook patient

## 2016-07-29 NOTE — Telephone Encounter (Signed)
Questions about Lasix? Please inquire.

## 2016-07-29 NOTE — Telephone Encounter (Signed)
Pt called and is requesting a refill on her pregabalin (LYRICA) 75 MG capsule, she would like it for 90 days. Pt also said she had a question in regards to her furosemide (LASIX) 20 MG tablet. Please advise, thank you!  [place], [place]  Call pt @ [phone]  Pt's address -  [address] [place], [place], [address]

## 2016-07-29 NOTE — Telephone Encounter (Signed)
Pt stated that she was asking about dosage but stated she got it clarified.

## 2016-07-30 NOTE — Telephone Encounter (Signed)
rx faxed

## 2016-08-01 DIAGNOSIS — J069 Acute upper respiratory infection, unspecified: Secondary | ICD-10-CM | POA: Diagnosis not present

## 2016-08-07 DIAGNOSIS — R05 Cough: Secondary | ICD-10-CM | POA: Diagnosis not present

## 2016-08-07 DIAGNOSIS — Z7982 Long term (current) use of aspirin: Secondary | ICD-10-CM | POA: Diagnosis not present

## 2016-08-07 DIAGNOSIS — I10 Essential (primary) hypertension: Secondary | ICD-10-CM | POA: Diagnosis not present

## 2016-08-07 DIAGNOSIS — J479 Bronchiectasis, uncomplicated: Secondary | ICD-10-CM | POA: Diagnosis not present

## 2016-08-07 DIAGNOSIS — Z9089 Acquired absence of other organs: Secondary | ICD-10-CM | POA: Diagnosis not present

## 2016-08-07 DIAGNOSIS — R0689 Other abnormalities of breathing: Secondary | ICD-10-CM | POA: Diagnosis not present

## 2016-08-07 DIAGNOSIS — I252 Old myocardial infarction: Secondary | ICD-10-CM | POA: Diagnosis not present

## 2016-08-07 DIAGNOSIS — J069 Acute upper respiratory infection, unspecified: Secondary | ICD-10-CM | POA: Diagnosis not present

## 2016-08-07 DIAGNOSIS — Z79899 Other long term (current) drug therapy: Secondary | ICD-10-CM | POA: Diagnosis not present

## 2016-08-12 ENCOUNTER — Telehealth: Payer: Self-pay | Admitting: *Deleted

## 2016-08-12 NOTE — Telephone Encounter (Signed)
Patient is currently out of town, she has been exposed to the flu by family members. Pt currently has a  Headache, in which she think is associated with the flu.  Patient requested that a medication to prevent the flu for herself Pt contact [phone] Pt is currently in [place] [phone]

## 2016-08-12 NOTE — Telephone Encounter (Signed)
Patient advised and verbalized an understanding  

## 2016-08-12 NOTE — Telephone Encounter (Signed)
Reason for call:exposure to flu Symptoms:fatigue, tiredness Duration today Medications:Azithromycin 250 mg Last seen for this problem: Seen by: N/A

## 2016-08-12 NOTE — Telephone Encounter (Signed)
She will have to be seen by a provider there.

## 2016-08-23 ENCOUNTER — Emergency Department: Payer: Medicare HMO

## 2016-08-23 ENCOUNTER — Observation Stay
Admission: EM | Admit: 2016-08-23 | Discharge: 2016-08-26 | Disposition: A | Discharge status: Skilled Nursing Facility | Payer: Medicare HMO | Attending: Internal Medicine | Admitting: Internal Medicine

## 2016-08-23 ENCOUNTER — Ambulatory Visit: Payer: Medicare HMO | Admitting: Cardiovascular Disease

## 2016-08-23 ENCOUNTER — Encounter: Payer: Self-pay | Admitting: Emergency Medicine

## 2016-08-23 DIAGNOSIS — R69 Illness, unspecified: Secondary | ICD-10-CM | POA: Diagnosis not present

## 2016-08-23 DIAGNOSIS — J440 Chronic obstructive pulmonary disease with acute lower respiratory infection: Secondary | ICD-10-CM | POA: Diagnosis not present

## 2016-08-23 DIAGNOSIS — J189 Pneumonia, unspecified organism: Secondary | ICD-10-CM | POA: Insufficient documentation

## 2016-08-23 DIAGNOSIS — X500XXA Overexertion from strenuous movement or load, initial encounter: Secondary | ICD-10-CM | POA: Diagnosis not present

## 2016-08-23 DIAGNOSIS — Z79899 Other long term (current) drug therapy: Secondary | ICD-10-CM | POA: Diagnosis not present

## 2016-08-23 DIAGNOSIS — K219 Gastro-esophageal reflux disease without esophagitis: Secondary | ICD-10-CM | POA: Insufficient documentation

## 2016-08-23 DIAGNOSIS — Z7982 Long term (current) use of aspirin: Secondary | ICD-10-CM | POA: Insufficient documentation

## 2016-08-23 DIAGNOSIS — I255 Ischemic cardiomyopathy: Secondary | ICD-10-CM | POA: Insufficient documentation

## 2016-08-23 DIAGNOSIS — Y9389 Activity, other specified: Secondary | ICD-10-CM | POA: Insufficient documentation

## 2016-08-23 DIAGNOSIS — S82853A Displaced trimalleolar fracture of unspecified lower leg, initial encounter for closed fracture: Secondary | ICD-10-CM | POA: Diagnosis not present

## 2016-08-23 DIAGNOSIS — S82851A Displaced trimalleolar fracture of right lower leg, initial encounter for closed fracture: Secondary | ICD-10-CM

## 2016-08-23 DIAGNOSIS — I110 Hypertensive heart disease with heart failure: Secondary | ICD-10-CM | POA: Diagnosis not present

## 2016-08-23 DIAGNOSIS — E785 Hyperlipidemia, unspecified: Secondary | ICD-10-CM | POA: Diagnosis not present

## 2016-08-23 DIAGNOSIS — M25571 Pain in right ankle and joints of right foot: Secondary | ICD-10-CM | POA: Diagnosis present

## 2016-08-23 DIAGNOSIS — I252 Old myocardial infarction: Secondary | ICD-10-CM | POA: Diagnosis not present

## 2016-08-23 DIAGNOSIS — S82841A Displaced bimalleolar fracture of right lower leg, initial encounter for closed fracture: Secondary | ICD-10-CM | POA: Diagnosis not present

## 2016-08-23 DIAGNOSIS — I10 Essential (primary) hypertension: Secondary | ICD-10-CM

## 2016-08-23 DIAGNOSIS — I700 Atherosclerosis of aorta: Secondary | ICD-10-CM | POA: Diagnosis not present

## 2016-08-23 DIAGNOSIS — Z01818 Encounter for other preprocedural examination: Secondary | ICD-10-CM | POA: Diagnosis not present

## 2016-08-23 DIAGNOSIS — Z96641 Presence of right artificial hip joint: Secondary | ICD-10-CM | POA: Diagnosis not present

## 2016-08-23 DIAGNOSIS — I2510 Atherosclerotic heart disease of native coronary artery without angina pectoris: Secondary | ICD-10-CM | POA: Insufficient documentation

## 2016-08-23 DIAGNOSIS — I5042 Chronic combined systolic (congestive) and diastolic (congestive) heart failure: Secondary | ICD-10-CM | POA: Insufficient documentation

## 2016-08-23 DIAGNOSIS — I5022 Chronic systolic (congestive) heart failure: Secondary | ICD-10-CM

## 2016-08-23 DIAGNOSIS — Y92009 Unspecified place in unspecified non-institutional (private) residence as the place of occurrence of the external cause: Secondary | ICD-10-CM | POA: Insufficient documentation

## 2016-08-23 DIAGNOSIS — S82891S Other fracture of right lower leg, sequela: Secondary | ICD-10-CM | POA: Diagnosis present

## 2016-08-23 DIAGNOSIS — S82891A Other fracture of right lower leg, initial encounter for closed fracture: Secondary | ICD-10-CM

## 2016-08-23 DIAGNOSIS — R262 Difficulty in walking, not elsewhere classified: Secondary | ICD-10-CM

## 2016-08-23 DIAGNOSIS — R509 Fever, unspecified: Secondary | ICD-10-CM

## 2016-08-23 DIAGNOSIS — F329 Major depressive disorder, single episode, unspecified: Secondary | ICD-10-CM | POA: Diagnosis not present

## 2016-08-23 DIAGNOSIS — M6281 Muscle weakness (generalized): Secondary | ICD-10-CM

## 2016-08-23 DIAGNOSIS — Z01810 Encounter for preprocedural cardiovascular examination: Secondary | ICD-10-CM | POA: Diagnosis not present

## 2016-08-23 LAB — CBC WITH DIFFERENTIAL/PLATELET
Basophils Absolute: 0 10*3/uL (ref 0–0.1)
Basophils Relative: 0 %
Eosinophils Absolute: 0.4 10*3/uL (ref 0–0.7)
Eosinophils Relative: 4 %
HCT: 38.3 % (ref 35.0–47.0)
Hemoglobin: 12.7 g/dL (ref 12.0–16.0)
LYMPHS ABS: 1.5 10*3/uL (ref 1.0–3.6)
LYMPHS PCT: 16 %
MCH: 30.9 pg (ref 26.0–34.0)
MCHC: 33 g/dL (ref 32.0–36.0)
MCV: 93.5 fL (ref 80.0–100.0)
MONOS PCT: 9 %
Monocytes Absolute: 0.9 10*3/uL (ref 0.2–0.9)
NEUTROS PCT: 71 %
Neutro Abs: 6.9 10*3/uL — ABNORMAL HIGH (ref 1.4–6.5)
Platelets: 155 10*3/uL (ref 150–440)
RBC: 4.1 MIL/uL (ref 3.80–5.20)
RDW: 13.5 % (ref 11.5–14.5)
WBC: 9.7 10*3/uL (ref 3.6–11.0)

## 2016-08-23 LAB — COMPREHENSIVE METABOLIC PANEL
ALK PHOS: 79 U/L (ref 38–126)
ALT: 47 U/L (ref 14–54)
ANION GAP: 7 (ref 5–15)
AST: 68 U/L — ABNORMAL HIGH (ref 15–41)
Albumin: 4 g/dL (ref 3.5–5.0)
BILIRUBIN TOTAL: 0.5 mg/dL (ref 0.3–1.2)
BUN: 21 mg/dL — ABNORMAL HIGH (ref 6–20)
CALCIUM: 9.4 mg/dL (ref 8.9–10.3)
CO2: 29 mmol/L (ref 22–32)
Chloride: 105 mmol/L (ref 101–111)
Creatinine, Ser: 0.99 mg/dL (ref 0.44–1.00)
GFR, EST AFRICAN AMERICAN: 59 mL/min — AB (ref >60)
GFR, EST NON AFRICAN AMERICAN: 51 mL/min — AB (ref >60)
Glucose, Bld: 138 mg/dL — ABNORMAL HIGH (ref 65–99)
Potassium: 4.2 mmol/L (ref 3.5–5.1)
Sodium: 141 mmol/L (ref 135–145)
TOTAL PROTEIN: 6.9 g/dL (ref 6.5–8.1)

## 2016-08-23 LAB — SURGICAL PCR SCREEN
MRSA, PCR: NEGATIVE
Staphylococcus aureus: NEGATIVE

## 2016-08-23 LAB — URINALYSIS, COMPLETE (UACMP) WITH MICROSCOPIC
BACTERIA UA: NONE SEEN
BILIRUBIN URINE: NEGATIVE
GLUCOSE, UA: NEGATIVE mg/dL
HGB URINE DIPSTICK: NEGATIVE
Ketones, ur: NEGATIVE mg/dL
Leukocytes, UA: NEGATIVE
NITRITE: NEGATIVE
PH: 6 (ref 5.0–8.0)
Protein, ur: NEGATIVE mg/dL
SPECIFIC GRAVITY, URINE: 1.008 (ref 1.005–1.030)
Squamous Epithelial / LPF: NONE SEEN

## 2016-08-23 MED ORDER — MORPHINE SULFATE (PF) 2 MG/ML IV SOLN
INTRAVENOUS | Status: AC
  Administered 2016-08-23: 2 mg via INTRAVENOUS
  Filled 2016-08-23: qty 1

## 2016-08-23 MED ORDER — AMLODIPINE BESYLATE 5 MG PO TABS
2.5000 mg | ORAL_TABLET | Freq: Every day | ORAL | Status: DC
  Administered 2016-08-23: 2.5 mg via ORAL
  Filled 2016-08-23: qty 1

## 2016-08-23 MED ORDER — ADULT MULTIVITAMIN W/MINERALS CH
1.0000 | ORAL_TABLET | Freq: Every day | ORAL | Status: DC
  Administered 2016-08-25 – 2016-08-26 (×2): 1 via ORAL
  Filled 2016-08-23 (×2): qty 1

## 2016-08-23 MED ORDER — PREGABALIN 75 MG PO CAPS
75.0000 mg | ORAL_CAPSULE | Freq: Two times a day (BID) | ORAL | Status: DC
  Administered 2016-08-23 – 2016-08-26 (×5): 75 mg via ORAL
  Filled 2016-08-23 (×5): qty 1

## 2016-08-23 MED ORDER — CARVEDILOL 6.25 MG PO TABS
6.2500 mg | ORAL_TABLET | Freq: Two times a day (BID) | ORAL | Status: DC
  Administered 2016-08-23 – 2016-08-26 (×6): 6.25 mg via ORAL
  Filled 2016-08-23 (×7): qty 1

## 2016-08-23 MED ORDER — ATORVASTATIN CALCIUM 20 MG PO TABS
80.0000 mg | ORAL_TABLET | Freq: Every day | ORAL | Status: DC
  Administered 2016-08-23 – 2016-08-25 (×3): 80 mg via ORAL
  Filled 2016-08-23 (×3): qty 4

## 2016-08-23 MED ORDER — NITROGLYCERIN 0.4 MG SL SUBL: 0.4000 mg | SUBLINGUAL_TABLET | SUBLINGUAL | Status: DC | PRN

## 2016-08-23 MED ORDER — MORPHINE SULFATE (PF) 4 MG/ML IV SOLN
4.0000 mg | Freq: Once | INTRAVENOUS | Status: AC
  Administered 2016-08-23: 4 mg via INTRAVENOUS

## 2016-08-23 MED ORDER — AMLODIPINE BESYLATE 5 MG PO TABS: 2.5000 mg | ORAL_TABLET | Freq: Every day | ORAL | Status: DC

## 2016-08-23 MED ORDER — MORPHINE SULFATE (PF) 2 MG/ML IV SOLN: 2.0000 mg | Freq: Once | INTRAVENOUS | Status: DC

## 2016-08-23 MED ORDER — FUROSEMIDE 20 MG PO TABS
20.0000 mg | ORAL_TABLET | ORAL | Status: DC
  Administered 2016-08-26: 20 mg via ORAL
  Filled 2016-08-23: qty 1

## 2016-08-23 MED ORDER — PANTOPRAZOLE SODIUM 40 MG PO TBEC
40.0000 mg | DELAYED_RELEASE_TABLET | Freq: Every day | ORAL | Status: DC
  Administered 2016-08-25 – 2016-08-26 (×2): 40 mg via ORAL
  Filled 2016-08-23 (×2): qty 1

## 2016-08-23 MED ORDER — LISINOPRIL 20 MG PO TABS
20.0000 mg | ORAL_TABLET | Freq: Every day | ORAL | Status: DC
  Administered 2016-08-23: 20 mg via ORAL
  Filled 2016-08-23: qty 1

## 2016-08-23 MED ORDER — VITAMIN B-1 100 MG PO TABS: 100.0000 mg | ORAL_TABLET | Freq: Every day | ORAL | Status: DC

## 2016-08-23 MED ORDER — ACETAMINOPHEN 325 MG PO TABS: 650.0000 mg | ORAL_TABLET | Freq: Four times a day (QID) | ORAL | Status: DC | PRN

## 2016-08-23 MED ORDER — BLISTEX MEDICATED EX OINT
TOPICAL_OINTMENT | CUTANEOUS | Status: DC | PRN
  Filled 2016-08-23: qty 6.3

## 2016-08-23 MED ORDER — ACETAMINOPHEN 650 MG RE SUPP
650.0000 mg | Freq: Four times a day (QID) | RECTAL | Status: DC | PRN
Start: 2016-08-23 — End: 2016-08-24

## 2016-08-23 MED ORDER — INFLUENZA VAC SPLIT QUAD 0.5 ML IM SUSY: 0.5000 mL | PREFILLED_SYRINGE | INTRAMUSCULAR | Status: DC

## 2016-08-23 MED ORDER — MORPHINE SULFATE (PF) 4 MG/ML IV SOLN
INTRAVENOUS | Status: AC
  Filled 2016-08-23: qty 1

## 2016-08-23 MED ORDER — OXYCODONE HCL 5 MG PO TABS
5.0000 mg | ORAL_TABLET | ORAL | Status: DC | PRN
  Administered 2016-08-23 – 2016-08-24 (×3): 5 mg via ORAL
  Filled 2016-08-23 (×3): qty 1

## 2016-08-23 MED ORDER — MORPHINE SULFATE (PF) 2 MG/ML IV SOLN
2.0000 mg | INTRAVENOUS | Status: DC | PRN
  Administered 2016-08-23 – 2016-08-24 (×3): 2 mg via INTRAVENOUS
  Filled 2016-08-23 (×3): qty 1

## 2016-08-23 MED ORDER — SERTRALINE HCL 100 MG PO TABS
100.0000 mg | ORAL_TABLET | Freq: Every day | ORAL | Status: DC
  Administered 2016-08-25 – 2016-08-26 (×2): 100 mg via ORAL
  Filled 2016-08-23 (×2): qty 1

## 2016-08-23 NOTE — Progress Notes (Signed)
Pts BP 190/73, recheck 176/63. MD [place] notified. Orders received to start home meds for BP now. Pharmacy notified.

## 2016-08-23 NOTE — ED Notes (Signed)
pateint said she was hurting again, so I asked pa for order for morphine.  Updated the floor rn anna regarding med given prior to transport.

## 2016-08-23 NOTE — Care Management Obs Status (Signed)
[facility] NOTIFICATION   Patient Details  Name: Katherine [name] MRN: [id number] Date of Birth: [birth year]   Medicare Observation Status Notification Given:  Yes    Jolly Mango, RN 08/23/2016, 3:14 PM

## 2016-08-23 NOTE — Consult Note (Signed)
ORTHOPAEDIC CONSULTATION  REQUESTING PHYSICIAN: Loletha Grayer, MD  Chief Complaint: Right bimalleolar ankle fracture  HPI: Katherine [name] is a 83 y.o. female who complains of pain in the right ankle after she lost her balance and sent down on her right ankle to avoid falling. She then had pain in the right ankle and presented to the [facility] emergency Department where it was determined she had sustained a closed bimalleolar ankle fracture. Patient is seen in the ER with her daughter at the bedside. Patient has been splinted by the ER staff and is not currently having significant pain. Patient is being admitted by the hospitalist service who has consulted cardiology for assistance with preoperative clearance.  Past Medical History:  Diagnosis Date  . Arthritis   . Chicken pox   . Chronic combined systolic and diastolic heart failure ([place])    a. 10/2015 Echo: EF 35-40%, Akinesis of mid-apicalanteroseptal wall & AK of apex, Gr 1DD.  Marland Kitchen COPD (chronic obstructive pulmonary disease) ([place])    worked in a [employer]  . GERD (gastroesophageal reflux disease)   . Heart attack    4/17  . Hyperlipidemia   . Hypertension   . Hypertensive heart disease   . Ischemic cardiomyopathy   . Occlusive coronary artery disease requiring drug therapy    a. Remote MI - never sought treatment when it occurred, shows up old on EKG;  b. Cath 10/2015 - 100% pLAD CTO with 80% D1, prox Cx 60% (FFR 0.92-->med Rx) - EF ~35%   Past Surgical History:  Procedure Laterality Date  . ABDOMINAL HYSTERECTOMY  1985  . AMPUTATION FINGER Left 1966   ring finger  . APPENDECTOMY  1961  . BACK SURGERY  2010   lower back  . CARDIAC CATHETERIZATION N/A 10/30/2015   Procedure: Right/Left Heart Cath and Coronary Angiography;  Surgeon: Wellington Hampshire, MD;  Location: [facility];  Service: Cardiovascular;  Laterality: N/A;  . CARDIAC CATHETERIZATION N/A 10/30/2015   Procedure: Intravascular Pressure Wire/FFR Study;   Surgeon: Wellington Hampshire, MD;  Location: [facility];  Service: Cardiovascular;  Laterality: N/A;  . SHOULDER SURGERY    . TONSILLECTOMY  1953  . TOTAL HIP ARTHROPLASTY Right 2012   Social History   Social History  . Marital status: Single    Spouse name: N/A  . Number of children: N/A  . Years of education: N/A   Social History Main Topics  . Smoking status: Never Smoker  . Smokeless tobacco: Never Used  . Alcohol use No  . Drug use: No  . Sexual activity: No   Other Topics Concern  . None   Social History Narrative  . None   Family History  Problem Relation Age of Onset  . Heart disease Mother   . Stroke Mother   . Hypertension Mother   . Lung cancer Mother   . Hyperlipidemia Mother   . CAD Sister   . Heart disease Sister   . Stroke Sister   . Hypertension Sister   . Arthritis Sister   . CAD Brother   . Heart disease Brother   . Hypertension Brother   . Heart disease Sister   . Stroke Sister   . Hypertension Sister   . Arthritis Sister   . Heart disease Sister   . Stroke Sister   . Hypertension Sister   . Breast cancer Sister   . Heart disease Sister   . Stroke Sister   . Hypertension Sister   .  Heart disease Brother   . Hypertension Brother   . Heart disease Brother   . Hypertension Brother   . Heart disease Brother   . Hypertension Brother   . Heart disease Brother   . Hypertension Brother   . Heart disease Brother   . Hypertension Brother    No Known Allergies Prior to Admission medications   Medication Sig Start Date End Date Taking? Authorizing Provider  amLODipine (NORVASC) 2.5 MG tablet Take 1 tablet (2.5 mg total) by mouth daily. 02/25/16  Yes Coral Spikes, DO  aspirin EC 81 MG tablet Take 81 mg by mouth daily.   Yes Historical Provider, MD  atorvastatin (LIPITOR) 80 MG tablet Take 1 tablet (80 mg total) by mouth daily at 6 PM. 11/14/15  Yes Imogene Burn, PA-C  carvedilol (COREG) 6.25 MG tablet Take 1 tablet (6.25 mg total) by  mouth 2 (two) times daily with a meal. 02/25/16  Yes Coral Spikes, DO  furosemide (LASIX) 20 MG tablet Take 1 tablet (20 mg total) by mouth every other day. 12/18/15  Yes Wellington Hampshire, MD  multivitamin-iron-minerals-folic acid (CENTRUM) chewable tablet Chew 1 tablet by mouth daily.   Yes Historical Provider, MD  omega-3 acid ethyl esters (LOVAZA) 1 g capsule Take 1 g by mouth daily.   Yes Historical Provider, MD  pantoprazole (PROTONIX) 40 MG tablet Take 1 tablet (40 mg total) by mouth daily. 05/12/16  Yes Jayce G Cook, DO  pregabalin (LYRICA) 75 MG capsule Take 1 capsule (75 mg total) by mouth 2 (two) times daily. 07/29/16  Yes Coral Spikes, DO  sertraline (ZOLOFT) 100 MG tablet Take 1 tablet (100 mg total) by mouth daily. 02/25/16  Yes Coral Spikes, DO  HYDROcodone-acetaminophen (NORCO/VICODIN) 5-325 MG tablet Take 1 tablet by mouth every 4 (four) hours as needed for moderate pain. 03/30/16   Molli Barrows, MD  nitroGLYCERIN (NITROSTAT) 0.4 MG SL tablet Place 1 tablet (0.4 mg total) under the tongue every 5 (five) minutes as needed for chest pain. 11/14/15   Imogene Burn, PA-C  thiamine (VITAMIN B-1) 100 MG tablet Take 100 mg by mouth daily.    Historical Provider, MD  Vitamin D, Ergocalciferol, (DRISDOL) 50000 units CAPS capsule Take 50,000 Units by mouth every 7 (seven) days.    Historical Provider, MD   Dg Chest 1 View  Result Date: 08/23/2016 CLINICAL DATA:  Preop for ankle surgery. EXAM: CHEST 1 VIEW COMPARISON:  Radiographs of March 11, 2016. FINDINGS: Stable cardiomediastinal silhouette. Atherosclerosis of thoracic aorta is noted. No pneumothorax or pleural effusion is noted. Both lungs are clear. The visualized skeletal structures are unremarkable. IMPRESSION: No acute cardiopulmonary abnormality seen. Electronically Signed   By: Marijo Conception, M.D.   On: 08/23/2016 13:06   Dg Ankle Complete Right  Result Date: 08/23/2016 CLINICAL DATA:  Injury after stepping wrong  unable to ambulate  EXAM: RIGHT ANKLE - COMPLETE 3+ VIEW COMPARISON:  None. FINDINGS: Three views of the left ankle submitted. There is mild displaced angulated comminuted fracture in distal fibula. Mild displaced fracture in distal tibia medial malleolus. There is disruption of ankle mortise with medial displacement of distal tibia. I suspect there is a small fracture in distal tibia posterior malleolus. Please see the lateral view. IMPRESSION: There is mild displaced comminuted, mild angulated fracture in distal right fibula. Mild displaced fracture distal tibia medial malleolus with disruption of ankle mortise. I cannot exclude subtle fracture in distal tibia posterior malleolus. Electronically  Signed   By: Lahoma Crocker M.D.   On: 08/23/2016 11:26    Positive ROS: All other systems have been reviewed and were otherwise negative with the exception of those mentioned in the HPI and as above.  Physical Exam: General: Alert, no acute distress  MUSCULOSKELETAL: Right lower extremity: Patient has an AO splint in place. The ER staff does not report an open injury. Patient has the ability to flex and extend her toes. Her toes are well-perfused and she has intact sensation to light touch.  Assessment: Right bimalleolar ankle fracture  Plan: I explained to the patient and her daughter that she has a bimalleolar ankle fracture. Patient has lateral talar subluxation. They understand this fracture pattern is unstable. I would recommend open reduction internal fixation ideally for this fracture. Surgical intervention will depend on cardiology clearance. If patient is not cleared from a cardiology standpoint, then the fracture to be close reduced and casted. The patient understands that the fracture should heal with casting but this may lead to a malunion.  Patient is tentatively on the OR schedule for tomorrow morning. She'll be nothing by mouth after midnight. Hold anticoagulation.    Thornton Park, MD    08/23/2016 1:55  PM

## 2016-08-23 NOTE — H&P (Signed)
[facility] [place] [place] NAME: Katherine [name]    MR#:  [id number]  DATE OF BIRTH:  1929-12-07  DATE OF ADMISSION:  08/23/2016  PRIMARY CARE PHYSICIAN: Coral Spikes, DO   REQUESTING/REFERRING PHYSICIAN: Dr Charlotte Crumb  CHIEF COMPLAINT:   Chief Complaint  Patient presents with  . Ankle Pain    HISTORY OF PRESENT ILLNESS:  Katherine [name]  is a 83 y.o. female with a known history of Coronary artery disease, congestive heart failure presents to the ER after a fall. She was putting wood into the stove and she felt a little pain in her back and hip and thought she was going to fall so she tried to sit down. She sat down on her ankle. Her ankle cracked and she had severe pain and came in to the hospital. No complaints of chest pain. No shortness of breath with exertion.  PAST MEDICAL HISTORY:   Past Medical History:  Diagnosis Date  . Arthritis   . Chicken pox   . Chronic combined systolic and diastolic heart failure ([place])    a. 10/2015 Echo: EF 35-40%, Akinesis of mid-apicalanteroseptal wall & AK of apex, Gr 1DD.  Marland Kitchen COPD (chronic obstructive pulmonary disease) ([place])    worked in a [employer]  . GERD (gastroesophageal reflux disease)   . Hyperlipidemia   . Hypertensive heart disease   . Ischemic cardiomyopathy    a. 10/2015 Echo: EF 35-40%, Akinesis of mid-apicalanteroseptal wall & AK of apex, Gr 1DD.  Marland Kitchen Occlusive coronary artery disease requiring drug therapy    a. Remote MI - never sought treatment when it occurred, shows up old on EKG;  b. Cath 10/2015 - 100% pLAD CTO with 80% D1, prox Cx 60% (FFR 0.92-->med Rx) - EF ~35%    PAST SURGICAL HISTORY:   Past Surgical History:  Procedure Laterality Date  . ABDOMINAL HYSTERECTOMY  1985  . AMPUTATION FINGER Left 1966   ring finger  . APPENDECTOMY  1961  . BACK SURGERY  2010   lower back  . CARDIAC CATHETERIZATION N/A 10/30/2015   Procedure: Right/Left Heart Cath and Coronary Angiography;   Surgeon: Wellington Hampshire, MD;  Location: [facility];  Service: Cardiovascular;  Laterality: N/A;  . CARDIAC CATHETERIZATION N/A 10/30/2015   Procedure: Intravascular Pressure Wire/FFR Study;  Surgeon: Wellington Hampshire, MD;  Location: [facility];  Service: Cardiovascular;  Laterality: N/A;  . SHOULDER SURGERY    . TONSILLECTOMY  1953  . TOTAL HIP ARTHROPLASTY Right 2012    SOCIAL HISTORY:   Social History  Substance Use Topics  . Smoking status: Never Smoker  . Smokeless tobacco: Never Used  . Alcohol use No    FAMILY HISTORY:   Family History  Problem Relation Age of Onset  . Heart disease Mother   . Stroke Mother   . Hypertension Mother   . Lung cancer Mother   . Hyperlipidemia Mother   . CAD Sister   . Heart disease Sister   . Stroke Sister   . Hypertension Sister   . Arthritis Sister   . CAD Brother   . Heart disease Brother   . Hypertension Brother   . Heart disease Sister   . Stroke Sister   . Hypertension Sister   . Arthritis Sister   . Heart disease Sister   . Stroke Sister   . Hypertension Sister   . Breast cancer Sister   . Heart disease Sister   .  Stroke Sister   . Hypertension Sister   . Heart disease Brother   . Hypertension Brother   . Heart disease Brother   . Hypertension Brother   . Heart disease Brother   . Hypertension Brother   . Heart disease Brother   . Hypertension Brother   . Heart disease Brother   . Hypertension Brother     DRUG ALLERGIES:  No Known Allergies  REVIEW OF SYSTEMS:  CONSTITUTIONAL: No fever, fatigue or weakness. Positive for sweats EYES: No blurred or double vision. Wears glasses EARS, NOSE, AND THROAT: No tinnitus or ear pain. No sore throat RESPIRATORY: No cough, shortness of breath, wheezing or hemoptysis.  CARDIOVASCULAR: No chest pain, orthopnea, edema.  GASTROINTESTINAL: No nausea, vomiting, diarrhea or abdominal pain. No blood in bowel movements. Positive for constipation GENITOURINARY: No  dysuria, hematuria.  ENDOCRINE: No polyuria, nocturia,  HEMATOLOGY: No anemia, easy bruising or bleeding SKIN: No rash or lesion. MUSCULOSKELETAL: No joint pain or arthritis.   NEUROLOGIC: No tingling, numbness, weakness.  PSYCHIATRY: No anxiety or depression.   MEDICATIONS AT HOME:   Prior to Admission medications   Medication Sig Start Date End Date Taking? Authorizing Provider  amLODipine (NORVASC) 2.5 MG tablet Take 1 tablet (2.5 mg total) by mouth daily. 02/25/16  Yes Coral Spikes, DO  aspirin EC 81 MG tablet Take 81 mg by mouth daily.   Yes Historical Provider, MD  atorvastatin (LIPITOR) 80 MG tablet Take 1 tablet (80 mg total) by mouth daily at 6 PM. 11/14/15  Yes Imogene Burn, PA-C  carvedilol (COREG) 6.25 MG tablet Take 1 tablet (6.25 mg total) by mouth 2 (two) times daily with a meal. 02/25/16  Yes Coral Spikes, DO  furosemide (LASIX) 20 MG tablet Take 1 tablet (20 mg total) by mouth every other day. 12/18/15  Yes Wellington Hampshire, MD  multivitamin-iron-minerals-folic acid (CENTRUM) chewable tablet Chew 1 tablet by mouth daily.   Yes Historical Provider, MD  omega-3 acid ethyl esters (LOVAZA) 1 g capsule Take 1 g by mouth daily.   Yes Historical Provider, MD  pantoprazole (PROTONIX) 40 MG tablet Take 1 tablet (40 mg total) by mouth daily. 05/12/16  Yes Jayce G Cook, DO  pregabalin (LYRICA) 75 MG capsule Take 1 capsule (75 mg total) by mouth 2 (two) times daily. 07/29/16  Yes Coral Spikes, DO  sertraline (ZOLOFT) 100 MG tablet Take 1 tablet (100 mg total) by mouth daily. 02/25/16  Yes Coral Spikes, DO  HYDROcodone-acetaminophen (NORCO/VICODIN) 5-325 MG tablet Take 1 tablet by mouth every 4 (four) hours as needed for moderate pain. 03/30/16   Molli Barrows, MD  nitroGLYCERIN (NITROSTAT) 0.4 MG SL tablet Place 1 tablet (0.4 mg total) under the tongue every 5 (five) minutes as needed for chest pain. 11/14/15   Imogene Burn, PA-C  thiamine (VITAMIN B-1) 100 MG tablet Take 100 mg by mouth  daily.    Historical Provider, MD  Vitamin D, Ergocalciferol, (DRISDOL) 50000 units CAPS capsule Take 50,000 Units by mouth every 7 (seven) days.    Historical Provider, MD      VITAL SIGNS:  Blood pressure (!) 155/58, pulse 62, temperature 98.4 F (36.9 C), temperature source Oral, resp. rate 17, height 5\' 1"  (1.549 m), weight 61.7 kg (136 lb), SpO2 98 %.  PHYSICAL EXAMINATION:  GENERAL:  83 y.o.-year-old patient lying in the bed with no acute distress.  EYES: Pupils equal, round, reactive to light and accommodation. No scleral icterus. Extraocular  muscles intact.  HEENT: Head atraumatic, normocephalic. Oropharynx and nasopharynx clear.  NECK:  Supple, no jugular venous distention. No thyroid enlargement, no tenderness.  LUNGS: Normal breath sounds bilaterally, no wheezing, rales,rhonchi or crepitation. No use of accessory muscles of respiration.  CARDIOVASCULAR: S1, S2 normal. No murmurs, rubs, or gallops.  ABDOMEN: Soft, nontender, nondistended. Bowel sounds present. No organomegaly or mass.  EXTREMITIES: Right ankle and brace NEUROLOGIC: Cranial nerves II through XII are intact. Sensation intact. Gait not checked.  PSYCHIATRIC: The patient is alert and oriented x 3.  SKIN: No rash, lesion, or ulcer.   LABORATORY PANEL:   CBC  Recent Labs Lab 08/23/16 1200  WBC 9.7  HGB 12.7  HCT 38.3  PLT 155   ------------------------------------------------------------------------------------------------------------------  Chemistries   Recent Labs Lab 08/23/16 1200  NA 141  K 4.2  CL 105  CO2 29  GLUCOSE 138*  BUN 21*  CREATININE 0.99  CALCIUM 9.4  AST 68*  ALT 47  ALKPHOS 79  BILITOT 0.5   ------------------------------------------------------------------------------------------------------------------    RADIOLOGY:  Dg Chest 1 View  Result Date: 08/23/2016 CLINICAL DATA:  Preop for ankle surgery. EXAM: CHEST 1 VIEW COMPARISON:  Radiographs of March 11, 2016.  FINDINGS: Stable cardiomediastinal silhouette. Atherosclerosis of thoracic aorta is noted. No pneumothorax or pleural effusion is noted. Both lungs are clear. The visualized skeletal structures are unremarkable. IMPRESSION: No acute cardiopulmonary abnormality seen. Electronically Signed   By: Marijo Conception, M.D.   On: 08/23/2016 13:06   Dg Ankle Complete Right  Result Date: 08/23/2016 CLINICAL DATA:  Injury after stepping wrong  unable to ambulate EXAM: RIGHT ANKLE - COMPLETE 3+ VIEW COMPARISON:  None. FINDINGS: Three views of the left ankle submitted. There is mild displaced angulated comminuted fracture in distal fibula. Mild displaced fracture in distal tibia medial malleolus. There is disruption of ankle mortise with medial displacement of distal tibia. I suspect there is a small fracture in distal tibia posterior malleolus. Please see the lateral view. IMPRESSION: There is mild displaced comminuted, mild angulated fracture in distal right fibula. Mild displaced fracture distal tibia medial malleolus with disruption of ankle mortise. I cannot exclude subtle fracture in distal tibia posterior malleolus. Electronically Signed   By: Lahoma Crocker M.D.   On: 08/23/2016 11:26    EKG:   Normal sinus rhythm 62 bpm, Q waves septally  IMPRESSION AND PLAN:   1.  Preoperative consultation for trimalleolar fracture right ankle. Patient is a higher risk patient secondary to her coronary artery disease and she wanted to speak with her cardiologist prior. Can consider doing spinal anesthesia. Hold aspirin. Patient has good exercise capacity. No contraindications to surgery at this time. 2. History of coronary artery disease hold aspirin. Continue Coreg and statin. 3. Essential hypertension continue usual antihypertensive medications 4. GERD on PPI 5. Depression on Zoloft 6. Hyperlipidemia unspecified on atorvastatin    All the records are reviewed and case discussed with ED provider. Management plans  discussed with the patient, family and they are in agreement.  CODE STATUS: Full code  TOTAL TIME TAKING CARE OF THIS PATIENT: 50 minutes.    Loletha Grayer M.D on 08/23/2016 at 6:43 PM  Between 7am to 6pm - Pager - [phone]  After 6pm call admission pager [phone]  [facility] Office  [phone]  CC: Primary care physician; Coral Spikes, DO

## 2016-08-23 NOTE — NC FL2 (Signed)
[place] LEVEL OF CARE SCREENING TOOL     IDENTIFICATION  Patient Name: Katherine [name] Birthdate: [birth year] Sex: female Admission Date (Current Location): 08/23/2016  [facility] and [place] Number:  Engineering geologist and Address:  [facility], [address] [place], [place] [address]      Provider Number: [id number]  Attending Physician Name and Address:  Loletha Grayer, MD  Relative Name and Phone Number:       Current Level of Care: Hospital Recommended Level of Care: [place] Prior Approval Number:    Date Approved/Denied:   PASRR Number:    Discharge Plan: SNF    Current Diagnoses: Patient Active Problem List   Diagnosis Date Noted  . Ankle fracture 08/23/2016  . Frequent falls 03/25/2016  . Cough 03/12/2016  . Chronic low back pain 01/08/2016  . Essential hypertension 12/02/2015  . Chronic combined systolic and diastolic heart failure ([place])   . Occlusive coronary artery disease requiring drug therapy   . Hyperlipidemia 11/02/2015  . Cardiomyopathy, ischemic 11/02/2015  . PAH (pulmonary artery hypertension) 11/02/2015  . NSTEMI (non-ST elevated myocardial infarction) ([place]) - 10/30/2015 10/30/2015  . COPD (chronic obstructive pulmonary disease) ([facility])     Orientation RESPIRATION BLADDER Height & Weight     Self, Time, Situation, Place  Normal Continent Weight: 136 lb (61.7 kg) Height:  5\' 1"  (154.9 cm)  BEHAVIORAL SYMPTOMS/MOOD NEUROLOGICAL BOWEL NUTRITION STATUS   (none )  (none ) Continent Diet (Diet: Heart Healthy )  AMBULATORY STATUS COMMUNICATION OF NEEDS Skin   Extensive Assist Verbally Surgical wounds                       Personal Care Assistance Level of Assistance  Bathing, Feeding, Dressing Bathing Assistance: Limited assistance Feeding assistance: Independent Dressing Assistance: Limited assistance     Functional Limitations Info  Sight, Hearing, Speech Sight Info:  Adequate Hearing Info: Adequate Speech Info: Adequate    SPECIAL CARE FACTORS FREQUENCY  PT (By licensed PT), OT (By licensed OT)     PT Frequency:  (5) OT Frequency:  (5)            Contractures      Additional Factors Info  Code Status, Allergies Code Status Info:  (Full Code. )  No Known Allergies.            Current Medications (08/23/2016):  This is the current hospital active medication list Current Facility-Administered Medications  Medication Dose Route Frequency Provider Last Rate Last Dose  . acetaminophen (TYLENOL) tablet 650 mg  650 mg Oral Q6H PRN Loletha Grayer, MD       Or  . acetaminophen (TYLENOL) suppository 650 mg  650 mg Rectal Q6H PRN Loletha Grayer, MD      . amLODipine (NORVASC) tablet 2.5 mg  2.5 mg Oral Daily Loletha Grayer, MD   2.5 mg at 08/23/16 1531  . atorvastatin (LIPITOR) tablet 80 mg  80 mg Oral q1800 Loletha Grayer, MD   80 mg at 08/23/16 1759  . carvedilol (COREG) tablet 6.25 mg  6.25 mg Oral BID WC Loletha Grayer, MD   6.25 mg at 08/23/16 1534  . [START ON 08/24/2016] furosemide (LASIX) tablet 20 mg  20 mg Oral QODAY Richard Leslye Peer, MD      . Derrill Memo ON 08/24/2016] Influenza vac split quadrivalent PF (FLUARIX) injection 0.5 mL  0.5 mL Intramuscular Tomorrow-1000 Loletha Grayer, MD      . lip balm (  BLISTEX) ointment   Topical PRN Thornton Park, MD      . lisinopril (PRINIVIL,ZESTRIL) tablet 20 mg  20 mg Oral Daily Wellington Hampshire, MD   20 mg at 08/23/16 1759  . morphine 2 MG/ML injection 2 mg  2 mg Intravenous Once Cari B Triplett, FNP      . morphine 2 MG/ML injection 2 mg  2 mg Intravenous Q4H PRN Loletha Grayer, MD   2 mg at 08/23/16 1755  . morphine 4 MG/ML injection           . [START ON 08/24/2016] multivitamin with minerals tablet 1 tablet  1 tablet Oral Daily Loletha Grayer, MD      . nitroGLYCERIN (NITROSTAT) SL tablet 0.4 mg  0.4 mg Sublingual Q5 min PRN Loletha Grayer, MD      . oxyCODONE (Oxy IR/ROXICODONE) immediate  release tablet 5 mg  5 mg Oral Q4H PRN Loletha Grayer, MD   5 mg at 08/23/16 1942  . pantoprazole (PROTONIX) EC tablet 40 mg  40 mg Oral Daily Loletha Grayer, MD      . pregabalin (LYRICA) capsule 75 mg  75 mg Oral BID Loletha Grayer, MD      . sertraline (ZOLOFT) tablet 100 mg  100 mg Oral Daily Loletha Grayer, MD      . Derrill Memo ON 08/24/2016] thiamine (VITAMIN B-1) tablet 100 mg  100 mg Oral Daily Loletha Grayer, MD         Discharge Medications: Please see discharge summary for a list of discharge medications.  Relevant Imaging Results:  Relevant Lab Results:   Additional Information  SSN: [id number]  Sample, Veronia Beets, LCSW

## 2016-08-23 NOTE — ED Provider Notes (Signed)
Apolonio Schneiders, attending physician, personally viewed and interpreted this EKG  EKG Time: 1309 Rate: 62 Rhythm: normal sinus rhythm Axis: normal Intervals: qtc 450 QRS: narrow, q waves V1, V2 ST changes: no st elevation Impression: abnormal ekg    Nance Pear, MD 08/23/16 1311

## 2016-08-23 NOTE — ED Provider Notes (Signed)
[facility] Emergency Department Provider Note ____________________________________________  Time seen: Approximately 11:21 AM  I have reviewed the triage vital signs and the nursing notes.   HISTORY  Chief Complaint Ankle Pain    HPI Katherine [name] is a 83 y.o. female who presents to the emergency department for evaluation after feeling as if she were going to lose her balance while putting wood in the stove around 8 AM and sat down abruptly onto her right foot. She states once she sat down, she felt and heard a pop. 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 She also reports that she has had decreased sensation to that right foot and ankle for many years as well. She has had to use a cane or walker to ambulate for many years. She has not had any increased episodes of dizziness, confusion, or weakness over the past few days.Today, pain is isolated in the right ankle. She denies pain in the right knee or hip.  Past Medical History:  Diagnosis Date  . Arthritis   . Chicken pox   . Chronic combined systolic and diastolic heart failure (HCC)    EF 35-40%, Akinesis of mid-apicalanteroseptal wall & AK of apex, Gr 1DD.  Marland Kitchen COPD (chronic obstructive pulmonary disease) ([place])    worked in a [employer]  . GERD (gastroesophageal reflux disease)   . Heart attack    4/17  . Hyperlipidemia   . Hypertension   . Hypertensive heart disease   . Ischemic cardiomyopathy   . Occlusive coronary artery disease requiring drug therapy    a. Remote MI - never sought treatment when it occurred, shows up old on EKG;  b. Cath 10/2015 - 100% pLAD CTO with 80% D1, prox Cx 60% - EF ~35%    Patient Active Problem List   Diagnosis Date Noted  . Ankle fracture 08/23/2016  . Frequent falls 03/25/2016  . Cough 03/12/2016  . Chronic low back pain 01/08/2016  . Essential hypertension 12/02/2015  . Chronic  combined systolic and diastolic heart failure ([place])   . Occlusive coronary artery disease requiring drug therapy   . Hyperlipidemia 11/02/2015  . Cardiomyopathy, ischemic 11/02/2015  . PAH (pulmonary artery hypertension) 11/02/2015  . NSTEMI (non-ST elevated myocardial infarction) ([place]) - 10/30/2015 10/30/2015  . COPD (chronic obstructive pulmonary disease) ([place])     Past Surgical History:  Procedure Laterality Date  . ABDOMINAL HYSTERECTOMY  1985  . AMPUTATION FINGER Left 1966   ring finger  . APPENDECTOMY  1961  . BACK SURGERY  2010   lower back  . CARDIAC CATHETERIZATION N/A 10/30/2015   Procedure: Right/Left Heart Cath and Coronary Angiography;  Surgeon: Wellington Hampshire, MD;  Location: [facility];  Service: Cardiovascular;  Laterality: N/A;  . CARDIAC CATHETERIZATION N/A 10/30/2015   Procedure: Intravascular Pressure Wire/FFR Study;  Surgeon: Wellington Hampshire, MD;  Location: [facility];  Service: Cardiovascular;  Laterality: N/A;  . SHOULDER SURGERY    . TONSILLECTOMY  1953  . TOTAL HIP ARTHROPLASTY Right 2012    Prior to Admission medications   Medication Sig Start Date End Date Taking? Authorizing Provider  amLODipine (NORVASC) 2.5 MG tablet Take 1 tablet (2.5 mg total) by mouth daily. 02/25/16  Yes Coral Spikes, DO  aspirin EC 81 MG tablet Take 81 mg by mouth daily.   Yes Historical Provider, MD  atorvastatin (LIPITOR)  80 MG tablet Take 1 tablet (80 mg total) by mouth daily at 6 PM. 11/14/15  Yes Imogene Burn, PA-C  carvedilol (COREG) 6.25 MG tablet Take 1 tablet (6.25 mg total) by mouth 2 (two) times daily with a meal. 02/25/16  Yes Coral Spikes, DO  furosemide (LASIX) 20 MG tablet Take 1 tablet (20 mg total) by mouth every other day. 12/18/15  Yes Wellington Hampshire, MD  multivitamin-iron-minerals-folic acid (CENTRUM) chewable tablet Chew 1 tablet by mouth daily.   Yes Historical Provider, MD  omega-3 acid ethyl esters (LOVAZA) 1 g capsule Take 1 g by mouth daily.    Yes Historical Provider, MD  pantoprazole (PROTONIX) 40 MG tablet Take 1 tablet (40 mg total) by mouth daily. 05/12/16  Yes Jayce G Cook, DO  pregabalin (LYRICA) 75 MG capsule Take 1 capsule (75 mg total) by mouth 2 (two) times daily. 07/29/16  Yes Coral Spikes, DO  sertraline (ZOLOFT) 100 MG tablet Take 1 tablet (100 mg total) by mouth daily. 02/25/16  Yes Coral Spikes, DO  HYDROcodone-acetaminophen (NORCO/VICODIN) 5-325 MG tablet Take 1 tablet by mouth every 4 (four) hours as needed for moderate pain. 03/30/16   Molli Barrows, MD  lisinopril (PRINIVIL,ZESTRIL) 20 MG tablet Take 1 tablet (20 mg total) by mouth daily. Patient not taking: Reported on 08/23/2016 02/25/16   Coral Spikes, DO  nitroGLYCERIN (NITROSTAT) 0.4 MG SL tablet Place 1 tablet (0.4 mg total) under the tongue every 5 (five) minutes as needed for chest pain. 11/14/15   Imogene Burn, PA-C  thiamine (VITAMIN B-1) 100 MG tablet Take 100 mg by mouth daily.    Historical Provider, MD  Vitamin D, Ergocalciferol, (DRISDOL) 50000 units CAPS capsule Take 50,000 Units by mouth every 7 (seven) days.    Historical Provider, MD    Allergies Patient has no known allergies.  Family History  Problem Relation Age of Onset  . Heart disease Mother   . Stroke Mother   . Hypertension Mother   . Lung cancer Mother   . Hyperlipidemia Mother   . CAD Sister   . Heart disease Sister   . Stroke Sister   . Hypertension Sister   . Arthritis Sister   . CAD Brother   . Heart disease Brother   . Hypertension Brother   . Heart disease Sister   . Stroke Sister   . Hypertension Sister   . Arthritis Sister   . Heart disease Sister   . Stroke Sister   . Hypertension Sister   . Breast cancer Sister   . Heart disease Sister   . Stroke Sister   . Hypertension Sister   . Heart disease Brother   . Hypertension Brother   . Heart disease Brother   . Hypertension Brother   . Heart disease Brother   . Hypertension Brother   . Heart disease Brother    . Hypertension Brother   . Heart disease Brother   . Hypertension Brother     Social History Social History  Substance Use Topics  . Smoking status: Never Smoker  . Smokeless tobacco: Never Used  . Alcohol use No    Review of Systems Constitutional: No recent illness. Cardiovascular: Denies chest pain or palpitations. Respiratory: Denies shortness of breath. Musculoskeletal: Pain in Right ankle Skin: Negative for rash, wound, lesion. Neurological: Negative for focal weakness or numbness.  ____________________________________________   PHYSICAL EXAM:  VITAL SIGNS: ED Triage Vitals [08/23/16 1034]  Enc Vitals Group  BP (!) 185/69     Pulse Rate (!) 56     Resp 18     Temp 98.1 F (36.7 C)     Temp Source Oral     SpO2 98 %     Weight      Height      Head Circumference      Peak Flow      Pain Score      Pain Loc      Pain Edu?      Excl. in [place]?     Constitutional: Alert and oriented. Well appearing and in no acute distress. Eyes: Conjunctivae are normal. EOMI. Head: Atraumatic. Neck: No stridor.  Respiratory: Normal respiratory effort.   Musculoskeletal: No tenderness to palpation over the proximal tibia or fibula. There is a deformity noted about the right ankle and the foot is resting in external rotation. DP and PT pulses are present.  Neurologic:  Normal speech and language. No gross focal neurologic deficits are appreciated. Speech is normal. Motor and sensory function intact of the toes. Skin:  Skin is warm, dry and intact. Atraumatic. Psychiatric: Mood and affect are normal. Speech and behavior are normal.  ____________________________________________   LABS (all labs ordered are listed, but only abnormal results are displayed)  Labs Reviewed  CBC WITH DIFFERENTIAL/PLATELET - Abnormal; Notable for the following:       Result Value   Neutro Abs 6.9 (*)    All other components within normal limits  COMPREHENSIVE METABOLIC PANEL - Abnormal;  Notable for the following:    Glucose, Bld 138 (*)    BUN 21 (*)    AST 68 (*)    GFR calc non Af Amer 51 (*)    GFR calc Af Amer 59 (*)    All other components within normal limits  URINALYSIS, COMPLETE (UACMP) WITH MICROSCOPIC - Abnormal; Notable for the following:    Color, Urine YELLOW (*)    APPearance CLEAR (*)    All other components within normal limits   ____________________________________________  RADIOLOGY  Right ankle: There is mild displaced comminuted, mild angulated fracture in  distal right fibula. Mild displaced fracture distal tibia medial  malleolus with disruption of ankle mortise. I cannot exclude subtle  fracture in distal tibia posterior malleolus.     ____________________________________________   PROCEDURES  Procedure(s) performed: Posterior and sugar tong OCL and ACE applied to the right ankle/foot. Patient is neurovascularly intact post-application.   ____________________________________________   INITIAL IMPRESSION / ASSESSMENT AND PLAN / ED COURSE  Clinical Course     Pertinent labs & imaging results that were available during my care of the patient were reviewed by me and considered in my medical decision making (see chart for details).  83 year old female presenting to the emergency department for evaluation of right ankle pain after injury this morning. X-ray reveals a bi/trimalleolar fracture with disruption of the ankle mortise. She will be admitted through the hospitalist service due to multiple comorbidities and have orthopedic consult. Dr. Mack Guise was notified of the patient and he agrees to evaluate her.  While in the emergency department today, she was given 2 mg of morphine IV with significant decrease in pain. OCL was placed by the ER tech. Patient was neurovascularly intact after application. She will have the routine preop studies and is now awaiting room assignment. ____________________________________________   FINAL  CLINICAL IMPRESSION(S) / ED DIAGNOSES  Final diagnoses:  Trimalleolar fracture of ankle, closed, right, initial encounter  Victorino Dike, FNP 08/23/16 1613    Schuyler Amor, MD 08/24/16 [phone]

## 2016-08-23 NOTE — Consult Note (Signed)
Cardiology Consult    Patient ID: Katherine [name] MRN: [id number], DOB/AGE: 11-08-1929   Admit date: 08/23/2016 Date of Consult: 08/23/2016  Primary Physician: Coral Spikes, DO Primary Cardiologist: Jerilynn Mages. Fletcher Anon, MD  Requesting Provider: [facility]  Patient Profile    83 year old female with a prior history of CAD status post non-STEMI in March 2007, hypertension, hyperlipidemia, ischemic cardiomyopathy myopathy, and chronic combined CHF, who presented to the emergency department January 8 after falling awkwardly on her right ankle resulting in distal tibia and fibula fracture. We've been asked to evaluate for preoperative clearance.  Past Medical History   Past Medical History:  Diagnosis Date  . Arthritis   . Chicken pox   . Chronic combined systolic and diastolic heart failure ([place])    a. 10/2015 Echo: EF 35-40%, Akinesis of mid-apicalanteroseptal wall & AK of apex, Gr 1DD.  Marland Kitchen COPD (chronic obstructive pulmonary disease) ([place])    worked in a [employer]  . GERD (gastroesophageal reflux disease)   . Hyperlipidemia   . Hypertensive heart disease   . Ischemic cardiomyopathy    a. 10/2015 Echo: EF 35-40%, Akinesis of mid-apicalanteroseptal wall & AK of apex, Gr 1DD.  Marland Kitchen Occlusive coronary artery disease requiring drug therapy    a. Remote MI - never sought treatment when it occurred, shows up old on EKG;  b. Cath 10/2015 - 100% pLAD CTO with 80% D1, prox Cx 60% (FFR 0.92-->med Rx) - EF ~35%    Past Surgical History:  Procedure Laterality Date  . ABDOMINAL HYSTERECTOMY  1985  . AMPUTATION FINGER Left 1966   ring finger  . APPENDECTOMY  1961  . BACK SURGERY  2010   lower back  . CARDIAC CATHETERIZATION N/A 10/30/2015   Procedure: Right/Left Heart Cath and Coronary Angiography;  Surgeon: Wellington Hampshire, MD;  Location: [facility];  Service: Cardiovascular;  Laterality: N/A;  . CARDIAC CATHETERIZATION N/A 10/30/2015   Procedure: Intravascular Pressure Wire/FFR Study;  Surgeon: Wellington Hampshire, MD;  Location: [facility];  Service: Cardiovascular;  Laterality: N/A;  . SHOULDER SURGERY    . TONSILLECTOMY  1953  . TOTAL HIP ARTHROPLASTY Right 2012     Allergies  No Known Allergies  History of Present Illness    83 year old female with the above complex past medical history including coronary artery disease status post non-ST segment elevation myocardial infarction March 2017. Catheterization at that time showed a chronic total occlusion of the LAD with moderate circumflex disease. Fractional flow reserve was performed within the left circumflex and was normal. Therefore medical therapy was recommended. In the setting of her LAD infarct, echocardiogram showed an EF of 35-40% with mid apical anteroseptal and apical akinesis. Grade 1 diastolic dysfunction was also noted. She also has hypertension, hyperlipidemia, and COPD. Though she remained in [place] for several months following her myocardial infarction, she subsequently moved back to [place] where she lives with family. She is very active and has not been having any significant symptoms or limitations. She walks frequently and also is responsible for carrying wood to the wood burning stove in their home. She has not been having any chest pain or dyspnea. She spent the holidays in [place] with one of her sons and is now staying with her daughter in [place]. Just the other day, they went shopping and walk for prolonged period of time without any symptoms or limitations. She denies chest pain, dyspnea, PND, orthopnea, dizziness, syncope, edema, or early satiety.  Earlier  today, she was putting some wood in the wood burning fireplace when she was leaning forward and felt as though she might fall. In order to correct herself, she decided to just sit abruptly onto the floor. In so doing, she twisted her right ankle awkwardly and says that she felt something pop. This was followed by fairly severe ankle pain. She was taken into the  emergency department where she was found to have a distal right tibia and fibula fracture. She has been seen by orthopedics with plan for surgery in the morning if cleared from cardiac perspective.  Inpatient Medications    . amLODipine  2.5 mg Oral Daily  . atorvastatin  80 mg Oral q1800  . carvedilol  6.25 mg Oral BID WC  . [START ON 08/24/2016] furosemide  20 mg Oral QODAY  . [START ON 08/24/2016] Influenza vac split quadrivalent PF  0.5 mL Intramuscular Tomorrow-1000  .  morphine injection  2 mg Intravenous Once  . morphine      . [START ON 08/24/2016] multivitamin with minerals  1 tablet Oral Daily  . pantoprazole  40 mg Oral Daily  . pregabalin  75 mg Oral BID  . sertraline  100 mg Oral Daily  . [START ON 08/24/2016] thiamine  100 mg Oral Daily    Family History    Family History  Problem Relation Age of Onset  . Heart disease Mother   . Stroke Mother   . Hypertension Mother   . Lung cancer Mother   . Hyperlipidemia Mother   . CAD Sister   . Heart disease Sister   . Stroke Sister   . Hypertension Sister   . Arthritis Sister   . CAD Brother   . Heart disease Brother   . Hypertension Brother   . Heart disease Sister   . Stroke Sister   . Hypertension Sister   . Arthritis Sister   . Heart disease Sister   . Stroke Sister   . Hypertension Sister   . Breast cancer Sister   . Heart disease Sister   . Stroke Sister   . Hypertension Sister   . Heart disease Brother   . Hypertension Brother   . Heart disease Brother   . Hypertension Brother   . Heart disease Brother   . Hypertension Brother   . Heart disease Brother   . Hypertension Brother   . Heart disease Brother   . Hypertension Brother     Social History    Social History   Social History  . Marital status: Single    Spouse name: N/A  . Number of children: N/A  . Years of education: N/A   Occupational History  . Not on file.   Social History Main Topics  . Smoking status: Never Smoker  . Smokeless  tobacco: Never Used  . Alcohol use No  . Drug use: No  . Sexual activity: No   Other Topics Concern  . Not on file   Social History Narrative   Patient lives in [place] but visits her family in [place] frequently. She is very active and walks often.     Review of Systems    General:  No chills, fever, night sweats or weight changes.  Cardiovascular:  No chest pain, dyspnea on exertion, edema, orthopnea, palpitations, paroxysmal nocturnal dyspnea. Dermatological: No rash, lesions/masses Respiratory: No cough, dyspnea Urologic: No hematuria, dysuria Abdominal:   No nausea, vomiting, diarrhea, bright red blood per rectum, melena, or hematemesis Neurologic:  No visual changes, wkns, changes in mental status. MSK:  Severe right ankle pain. All other systems reviewed and are otherwise negative except as noted above.  Physical Exam    Blood pressure (!) 155/58, pulse 62, temperature 98.4 F (36.9 C), temperature source Oral, resp. rate 17, height 5\' 1"  (1.549 m), weight 136 lb (61.7 kg), SpO2 98 %.  General: Pleasant, NAD Psych: Normal affect. Neuro: Alert and oriented X 3. Moves all extremities spontaneously. HEENT: Normal  Neck: Supple without bruits or JVD. Lungs:  Resp regular and unlabored, CTA. Heart: RRR no s3, s4, or murmurs. Abdomen: Soft, non-tender, non-distended, BS + x 4.  Extremities: No clubbing, cyanosis or edema. DP/PT/Radials 2+ and equal bilaterally.  Labs    Lab Results  Component Value Date   WBC 9.7 08/23/2016   HGB 12.7 08/23/2016   HCT 38.3 08/23/2016   MCV 93.5 08/23/2016   PLT 155 08/23/2016     Recent Labs Lab 08/23/16 1200  NA 141  K 4.2  CL 105  CO2 29  BUN 21*  CREATININE 0.99  CALCIUM 9.4  PROT 6.9  BILITOT 0.5  ALKPHOS 79  ALT 47  AST 68*  GLUCOSE 138*   Lab Results  Component Value Date   CHOL 114 12/16/2015   HDL 41 12/16/2015   LDLCALC 48 12/16/2015   TRIG 126 12/16/2015     Radiology Studies    Dg Chest 1  View  Result Date: 08/23/2016 CLINICAL DATA:  Preop for ankle surgery. EXAM: CHEST 1 VIEW COMPARISON:  Radiographs of March 11, 2016. FINDINGS: Stable cardiomediastinal silhouette. Atherosclerosis of thoracic aorta is noted. No pneumothorax or pleural effusion is noted. Both lungs are clear. The visualized skeletal structures are unremarkable. IMPRESSION: No acute cardiopulmonary abnormality seen. Electronically Signed   By: Marijo Conception, M.D.   On: 08/23/2016 13:06   Dg Ankle Complete Right  Result Date: 08/23/2016 CLINICAL DATA:  Injury after stepping wrong  unable to ambulate EXAM: RIGHT ANKLE - COMPLETE 3+ VIEW COMPARISON:  None. FINDINGS: Three views of the left ankle submitted. There is mild displaced angulated comminuted fracture in distal fibula. Mild displaced fracture in distal tibia medial malleolus. There is disruption of ankle mortise with medial displacement of distal tibia. I suspect there is a small fracture in distal tibia posterior malleolus. Please see the lateral view. IMPRESSION: There is mild displaced comminuted, mild angulated fracture in distal right fibula. Mild displaced fracture distal tibia medial malleolus with disruption of ankle mortise. I cannot exclude subtle fracture in distal tibia posterior malleolus. Electronically Signed   By: Lahoma Crocker M.D.   On: 08/23/2016 11:26    ECG & Cardiac Imaging    Rsr, 62, septal infarct, no acute changes.  Assessment & Plan    1.  R distal Tib/Fibula [id number] presented today after falling awkwardly on her right ankle and feeling a pop. X-rays shown right distal tibia and fibula fracture. She has been evaluated by orthopedics with recommendation for surgery. From a cardiac standpoint, she's been doing exceptionally well at home. She ambulates frequently and is able to do so without any chest pain or dyspnea. Her functional status overall has been quite good. She is at at least moderate risk for surgical complications related to her  history of coronary disease and ischemic cardiomyopathy myopathy however given her good functional status, she may proceed with surgery without any additional ischemic evaluation at this time. She should remain on beta blocker and high potency statin therapy  throughout the perioperative period. I recommend resuming aspirin once feasible from a surgical standpoint. We will follow along throughout the perioperative period.   2.  CAD:  Status post non-STEMI in 2017. She has a chronic total occlusion of the LAD with moderate circumflex disease. She has been medically managed and doing quite well without chest pain or dyspnea. As above, she may proceed to surgery without further ischemic evaluation. Continue beta blocker, ACE inhibitor, and statin therapy. Plan to resume aspirin when feasible from a surgical standpoint.   3.  ICM/Chronic Systolic CHF:  EF [id number] EF 35-40% by echo in March 2017. Patient has been doing well without any significant dyspnea, PND, orthopnea, edema, or early satiety. She reports good functional status/exercise tolerance. She is euvolemic on exam. Continue beta blocker and ACE inhibitor therapy. We'll have to watch her volume status closely in the perioperative period.   4.  Hypertensive Heart Dzs:  bp elevated in ER.  Resume home meds; pain mgmt.  5.  HL:  Cont statin therapy throughout perioperative period.  Signed, Murray Hodgkins, NP 08/23/2016, 5:15 PM

## 2016-08-23 NOTE — ED Triage Notes (Signed)
Pt here after stepping wrong with right leg and reports right ankle "popping out". Pt unable to ambulate, reprots sensation intact, distal pulses present.

## 2016-08-24 ENCOUNTER — Encounter: Payer: Self-pay | Admitting: Anesthesiology

## 2016-08-24 ENCOUNTER — Observation Stay: Payer: Medicare HMO | Admitting: Anesthesiology

## 2016-08-24 ENCOUNTER — Encounter
Admission: EM | Disposition: A | Discharge status: Skilled Nursing Facility | Payer: Self-pay | Source: Home / Self Care | Attending: Emergency Medicine

## 2016-08-24 ENCOUNTER — Observation Stay: Payer: Medicare HMO

## 2016-08-24 DIAGNOSIS — S82891A Other fracture of right lower leg, initial encounter for closed fracture: Secondary | ICD-10-CM | POA: Diagnosis not present

## 2016-08-24 DIAGNOSIS — S82841A Displaced bimalleolar fracture of right lower leg, initial encounter for closed fracture: Secondary | ICD-10-CM | POA: Diagnosis not present

## 2016-08-24 DIAGNOSIS — I10 Essential (primary) hypertension: Secondary | ICD-10-CM | POA: Diagnosis not present

## 2016-08-24 DIAGNOSIS — I2510 Atherosclerotic heart disease of native coronary artery without angina pectoris: Secondary | ICD-10-CM | POA: Diagnosis not present

## 2016-08-24 DIAGNOSIS — S82841D Displaced bimalleolar fracture of right lower leg, subsequent encounter for closed fracture with routine healing: Secondary | ICD-10-CM | POA: Diagnosis not present

## 2016-08-24 DIAGNOSIS — S82853A Displaced trimalleolar fracture of unspecified lower leg, initial encounter for closed fracture: Secondary | ICD-10-CM | POA: Diagnosis not present

## 2016-08-24 HISTORY — PX: ORIF ANKLE FRACTURE: SHX5408

## 2016-08-24 LAB — BASIC METABOLIC PANEL
ANION GAP: 5 (ref 5–15)
BUN: 19 mg/dL (ref 6–20)
CHLORIDE: 104 mmol/L (ref 101–111)
CO2: 29 mmol/L (ref 22–32)
Calcium: 8.9 mg/dL (ref 8.9–10.3)
Creatinine, Ser: 0.96 mg/dL (ref 0.44–1.00)
GFR calc Af Amer: >60 mL/min (ref >60)
GFR calc non Af Amer: 53 mL/min — ABNORMAL LOW (ref >60)
GLUCOSE: 112 mg/dL — AB (ref 65–99)
Potassium: 4.3 mmol/L (ref 3.5–5.1)
Sodium: 138 mmol/L (ref 135–145)

## 2016-08-24 LAB — CBC
HCT: 33.6 % — ABNORMAL LOW (ref 35.0–47.0)
HEMOGLOBIN: 11.3 g/dL — AB (ref 12.0–16.0)
MCH: 31.3 pg (ref 26.0–34.0)
MCHC: 33.6 g/dL (ref 32.0–36.0)
MCV: 93.2 fL (ref 80.0–100.0)
Platelets: 138 10*3/uL — ABNORMAL LOW (ref 150–440)
RBC: 3.61 MIL/uL — AB (ref 3.80–5.20)
RDW: 13.2 % (ref 11.5–14.5)
WBC: 10 10*3/uL (ref 3.6–11.0)

## 2016-08-24 SURGERY — OPEN REDUCTION INTERNAL FIXATION (ORIF) ANKLE FRACTURE
Anesthesia: Spinal | Site: Ankle | Laterality: Right | Wound class: Clean

## 2016-08-24 MED ORDER — OXYCODONE HCL 5 MG PO TABS
5.0000 mg | ORAL_TABLET | ORAL | Status: DC | PRN
  Administered 2016-08-24 – 2016-08-25 (×4): 5 mg via ORAL
  Administered 2016-08-25: 10 mg via ORAL
  Filled 2016-08-24 (×3): qty 1
  Filled 2016-08-24: qty 2
  Filled 2016-08-24: qty 1

## 2016-08-24 MED ORDER — FENTANYL CITRATE (PF) 100 MCG/2ML IJ SOLN
INTRAMUSCULAR | Status: AC
  Filled 2016-08-24: qty 2

## 2016-08-24 MED ORDER — PROPOFOL 10 MG/ML IV BOLUS
INTRAVENOUS | Status: DC | PRN
  Administered 2016-08-24: 10 mg via INTRAVENOUS
  Administered 2016-08-24: 20 mg via INTRAVENOUS

## 2016-08-24 MED ORDER — PHENOL 1.4 % MT LIQD
1.0000 | OROMUCOSAL | Status: DC | PRN
  Filled 2016-08-24: qty 177

## 2016-08-24 MED ORDER — METHOCARBAMOL 1000 MG/10ML IJ SOLN
500.0000 mg | Freq: Three times a day (TID) | INTRAVENOUS | Status: DC | PRN
  Administered 2016-08-24: 500 mg via INTRAVENOUS
  Filled 2016-08-24 (×3): qty 5

## 2016-08-24 MED ORDER — PHENYLEPHRINE HCL 10 MG/ML IJ SOLN
INTRAMUSCULAR | Status: AC
  Filled 2016-08-24: qty 1

## 2016-08-24 MED ORDER — BUPIVACAINE HCL (PF) 0.5 % IJ SOLN
INTRAMUSCULAR | Status: DC | PRN
  Administered 2016-08-24: 3 mL via INTRATHECAL

## 2016-08-24 MED ORDER — MAGNESIUM CITRATE PO SOLN
1.0000 | Freq: Once | ORAL | Status: DC | PRN
Start: 2016-08-24 — End: 2016-08-26
  Filled 2016-08-24: qty 296

## 2016-08-24 MED ORDER — ACETAMINOPHEN 650 MG RE SUPP: 650.0000 mg | Freq: Four times a day (QID) | RECTAL | Status: DC | PRN

## 2016-08-24 MED ORDER — NEOMYCIN-POLYMYXIN B GU 40-200000 IR SOLN
Status: DC | PRN
  Administered 2016-08-24: 2 mL

## 2016-08-24 MED ORDER — GLYCOPYRROLATE 0.2 MG/ML IJ SOLN
INTRAMUSCULAR | Status: DC | PRN
  Administered 2016-08-24: 0.2 mg via INTRAVENOUS

## 2016-08-24 MED ORDER — FERROUS SULFATE 325 (65 FE) MG PO TABS
325.0000 mg | ORAL_TABLET | Freq: Three times a day (TID) | ORAL | Status: DC
  Administered 2016-08-24 – 2016-08-26 (×6): 325 mg via ORAL
  Filled 2016-08-24 (×6): qty 1

## 2016-08-24 MED ORDER — BUPIVACAINE HCL (PF) 0.25 % IJ SOLN
INTRAMUSCULAR | Status: DC | PRN
  Administered 2016-08-24: 20 mL

## 2016-08-24 MED ORDER — GLYCOPYRROLATE 0.2 MG/ML IJ SOLN
INTRAMUSCULAR | Status: AC
  Filled 2016-08-24: qty 1

## 2016-08-24 MED ORDER — FENTANYL CITRATE (PF) 100 MCG/2ML IJ SOLN
INTRAMUSCULAR | Status: DC | PRN
  Administered 2016-08-24 (×2): 25 ug via INTRAVENOUS
  Administered 2016-08-24: 50 ug via INTRAVENOUS

## 2016-08-24 MED ORDER — DOCUSATE SODIUM 100 MG PO CAPS
100.0000 mg | ORAL_CAPSULE | Freq: Two times a day (BID) | ORAL | Status: DC
  Administered 2016-08-24 – 2016-08-26 (×4): 100 mg via ORAL
  Filled 2016-08-24 (×4): qty 1

## 2016-08-24 MED ORDER — EPHEDRINE 5 MG/ML INJ
INTRAVENOUS | Status: AC
  Filled 2016-08-24: qty 10

## 2016-08-24 MED ORDER — BUPIVACAINE HCL (PF) 0.25 % IJ SOLN
INTRAMUSCULAR | Status: AC
  Filled 2016-08-24: qty 30

## 2016-08-24 MED ORDER — ACETAMINOPHEN 325 MG PO TABS
650.0000 mg | ORAL_TABLET | Freq: Four times a day (QID) | ORAL | Status: DC | PRN
  Administered 2016-08-25: 650 mg via ORAL
  Filled 2016-08-24: qty 2

## 2016-08-24 MED ORDER — ENOXAPARIN SODIUM 40 MG/0.4ML ~~LOC~~ SOLN
40.0000 mg | SUBCUTANEOUS | Status: DC
  Administered 2016-08-25 – 2016-08-26 (×2): 40 mg via SUBCUTANEOUS
  Filled 2016-08-24 (×2): qty 0.4

## 2016-08-24 MED ORDER — MENTHOL 3 MG MT LOZG
1.0000 | LOZENGE | OROMUCOSAL | Status: DC | PRN
  Filled 2016-08-24: qty 9

## 2016-08-24 MED ORDER — SODIUM CHLORIDE 0.9 % IV SOLN
INTRAVENOUS | Status: DC | PRN
  Administered 2016-08-24: 25 ug/min via INTRAVENOUS

## 2016-08-24 MED ORDER — PROPOFOL 500 MG/50ML IV EMUL
INTRAVENOUS | Status: DC | PRN
  Administered 2016-08-24: 20 ug/kg/min via INTRAVENOUS

## 2016-08-24 MED ORDER — MORPHINE SULFATE (PF) 2 MG/ML IV SOLN
2.0000 mg | INTRAVENOUS | Status: DC | PRN
  Administered 2016-08-24 – 2016-08-26 (×4): 2 mg via INTRAVENOUS
  Filled 2016-08-24 (×4): qty 1

## 2016-08-24 MED ORDER — BISACODYL 10 MG RE SUPP
10.0000 mg | Freq: Every day | RECTAL | Status: DC | PRN
  Administered 2016-08-26: 10 mg via RECTAL
  Filled 2016-08-24: qty 1

## 2016-08-24 MED ORDER — SODIUM CHLORIDE 0.9 % IV SOLN
75.0000 mL/h | INTRAVENOUS | Status: DC
  Administered 2016-08-24 – 2016-08-25 (×2): 75 mL/h via INTRAVENOUS

## 2016-08-24 MED ORDER — BUPIVACAINE HCL (PF) 0.5 % IJ SOLN
INTRAMUSCULAR | Status: AC
  Filled 2016-08-24: qty 10

## 2016-08-24 MED ORDER — PHENYLEPHRINE 40 MCG/ML (10ML) SYRINGE FOR IV PUSH (FOR BLOOD PRESSURE SUPPORT)
PREFILLED_SYRINGE | INTRAVENOUS | Status: AC
  Filled 2016-08-24: qty 10

## 2016-08-24 MED ORDER — CEFAZOLIN SODIUM-DEXTROSE 2-4 GM/100ML-% IV SOLN
2.0000 g | Freq: Four times a day (QID) | INTRAVENOUS | Status: AC
  Administered 2016-08-24 (×2): 2 g via INTRAVENOUS
  Filled 2016-08-24 (×2): qty 100

## 2016-08-24 MED ORDER — LIDOCAINE HCL (PF) 2 % IJ SOLN
INTRAMUSCULAR | Status: DC | PRN
  Administered 2016-08-24: 50 mg

## 2016-08-24 MED ORDER — PROPOFOL 500 MG/50ML IV EMUL
INTRAVENOUS | Status: AC
  Filled 2016-08-24: qty 50

## 2016-08-24 MED ORDER — LACTATED RINGERS IV SOLN
INTRAVENOUS | Status: DC | PRN
  Administered 2016-08-24: 09:00:00 via INTRAVENOUS

## 2016-08-24 MED ORDER — ALUM & MAG HYDROXIDE-SIMETH 200-200-20 MG/5ML PO SUSP: 30.0000 mL | ORAL | Status: DC | PRN

## 2016-08-24 MED ORDER — PHENYLEPHRINE HCL 10 MG/ML IJ SOLN
INTRAMUSCULAR | Status: DC | PRN
  Administered 2016-08-24 (×3): 80 ug via INTRAVENOUS

## 2016-08-24 MED ORDER — CEFAZOLIN SODIUM-DEXTROSE 2-4 GM/100ML-% IV SOLN
2.0000 g | INTRAVENOUS | Status: AC
  Administered 2016-08-24: 2 g via INTRAVENOUS
  Filled 2016-08-24: qty 100

## 2016-08-24 MED ORDER — SENNA 8.6 MG PO TABS
1.0000 | ORAL_TABLET | Freq: Two times a day (BID) | ORAL | Status: DC
  Administered 2016-08-24 – 2016-08-26 (×4): 8.6 mg via ORAL
  Filled 2016-08-24 (×4): qty 1

## 2016-08-24 MED ORDER — ONDANSETRON HCL 4 MG/2ML IJ SOLN: 4.0000 mg | Freq: Four times a day (QID) | INTRAMUSCULAR | Status: DC | PRN

## 2016-08-24 MED ORDER — LIDOCAINE 2% (20 MG/ML) 5 ML SYRINGE
INTRAMUSCULAR | Status: AC
  Filled 2016-08-24: qty 5

## 2016-08-24 MED ORDER — EPHEDRINE SULFATE 50 MG/ML IJ SOLN
INTRAMUSCULAR | Status: DC | PRN
  Administered 2016-08-24 (×2): 10 mg via INTRAVENOUS

## 2016-08-24 MED ORDER — ONDANSETRON HCL 4 MG/2ML IJ SOLN: 4.0000 mg | Freq: Once | INTRAMUSCULAR | Status: DC | PRN

## 2016-08-24 MED ORDER — NEOMYCIN-POLYMYXIN B GU 40-200000 IR SOLN
Status: AC
  Filled 2016-08-24: qty 2

## 2016-08-24 MED ORDER — POLYETHYLENE GLYCOL 3350 17 G PO PACK
17.0000 g | PACK | Freq: Every day | ORAL | Status: DC | PRN
  Administered 2016-08-26: 17 g via ORAL
  Filled 2016-08-24: qty 1

## 2016-08-24 MED ORDER — FENTANYL CITRATE (PF) 100 MCG/2ML IJ SOLN: 25.0000 ug | INTRAMUSCULAR | Status: DC | PRN

## 2016-08-24 MED ORDER — ONDANSETRON HCL 4 MG PO TABS: 4.0000 mg | ORAL_TABLET | Freq: Four times a day (QID) | ORAL | Status: DC | PRN

## 2016-08-24 SURGICAL SUPPLY — 56 items
BANDAGE ELASTIC 4 LF NS (GAUZE/BANDAGES/DRESSINGS) ×4 IMPLANT
BANDAGE ELASTIC 6 LF NS (GAUZE/BANDAGES/DRESSINGS) ×2 IMPLANT
BIT DRILL CANN 2.7X625 NONSTRL (BIT) ×2 IMPLANT
BLADE SURG 15 STRL LF DISP TIS (BLADE) ×1 IMPLANT
BLADE SURG 15 STRL SS (BLADE) ×1
BNDG ESMARK 6X12 TAN STRL LF (GAUZE/BANDAGES/DRESSINGS) ×2 IMPLANT
CUFF TOURN 24 STER (MISCELLANEOUS) ×2 IMPLANT
CUFF TOURN 30 STER DUAL PORT (MISCELLANEOUS) IMPLANT
DRAPE FLUOR MINI C-ARM 54X84 (DRAPES) ×2 IMPLANT
DRAPE INCISE IOBAN 66X45 STRL (DRAPES) ×2 IMPLANT
DRAPE U-SHAPE 47X51 STRL (DRAPES) ×2 IMPLANT
DURAPREP 26ML APPLICATOR (WOUND CARE) ×4 IMPLANT
GAUZE PETRO XEROFOAM 1X8 (MISCELLANEOUS) ×2 IMPLANT
GAUZE SPONGE 4X4 12PLY STRL (GAUZE/BANDAGES/DRESSINGS) ×2 IMPLANT
GLOVE BIO SURGEON STRL SZ8 (GLOVE) IMPLANT
GLOVE BIOGEL PI IND STRL 9 (GLOVE) ×1 IMPLANT
GLOVE BIOGEL PI INDICATOR 9 (GLOVE) ×1
GLOVE SURG 9.0 ORTHO LTXF (GLOVE) ×4 IMPLANT
GOWN STRL REUS TWL 2XL XL LVL4 (GOWN DISPOSABLE) ×2 IMPLANT
GOWN STRL REUS W/ TWL LRG LVL3 (GOWN DISPOSABLE) ×1 IMPLANT
GOWN STRL REUS W/TWL LRG LVL3 (GOWN DISPOSABLE) ×1
GUIDEWARE NON THREAD 1.25X150 (WIRE) ×4
GUIDEWIRE NON THREAD 1.25X150 (WIRE) ×2 IMPLANT
HOLDER FOLEY CATH W/STRAP (MISCELLANEOUS) ×2 IMPLANT
KIT RM TURNOVER STRD PROC AR (KITS) ×2 IMPLANT
LABEL OR SOLS (LABEL) IMPLANT
NEEDLE FILTER BLUNT 18X 1/2SAF (NEEDLE) ×1
NEEDLE FILTER BLUNT 18X1 1/2 (NEEDLE) ×1 IMPLANT
NS IRRIG 1000ML POUR BTL (IV SOLUTION) ×2 IMPLANT
PACK EXTREMITY ARMC (MISCELLANEOUS) ×2 IMPLANT
PAD ABD DERMACEA PRESS 5X9 (GAUZE/BANDAGES/DRESSINGS) ×12 IMPLANT
PAD CAST CTTN 4X4 STRL (SOFTGOODS) ×3 IMPLANT
PADDING CAST 3IN STRL (MISCELLANEOUS) ×2
PADDING CAST BLEND 3X4 STRL (MISCELLANEOUS) ×2 IMPLANT
PADDING CAST COTTON 4X4 STRL (SOFTGOODS) ×3
PLATE LCP 3.5 1/3 TUB 10HX117 (Plate) ×2 IMPLANT
SCREW CANC FT 4.0X20 (Screw) ×2 IMPLANT
SCREW CANC FT ST SFS 4X14 (Screw) IMPLANT
SCREW CANC FT/18 4.0 (Screw) ×2 IMPLANT
SCREW CANN L THRD/34 4.0 (Screw) ×2 IMPLANT
SCREW CANN L THRD/40 4.0 (Screw) ×2 IMPLANT
SCREW CORTEX 3.5 12MM (Screw) ×3 IMPLANT
SCREW CORTEX 3.5 14MM (Screw) ×3 IMPLANT
SCREW LOCK CORT ST 3.5X12 (Screw) ×3 IMPLANT
SCREW LOCK CORT ST 3.5X14 (Screw) ×3 IMPLANT
SPLINT CAST 1 STEP 4X30 (MISCELLANEOUS) ×4 IMPLANT
SPONGE LAP 18X18 5 PK (GAUZE/BANDAGES/DRESSINGS) ×4 IMPLANT
STAPLER SKIN PROX 35W (STAPLE) ×2 IMPLANT
STOCKINETTE STRL 6IN 960660 (GAUZE/BANDAGES/DRESSINGS) ×2 IMPLANT
STRIP CLOSURE SKIN 1/2X4 (GAUZE/BANDAGES/DRESSINGS) ×4 IMPLANT
SUT VIC AB 2-0 SH 27 (SUTURE) ×3
SUT VIC AB 2-0 SH 27XBRD (SUTURE) ×3 IMPLANT
SYR 30ML LL (SYRINGE) ×2 IMPLANT
SYR 5ML LL (SYRINGE) ×2 IMPLANT
TAPE MICROPORE 2IN (TAPE) ×2 IMPLANT
TRAY FOLEY CATH SILVER 16FR LF (SET/KITS/TRAYS/PACK) ×2 IMPLANT

## 2016-08-24 NOTE — Clinical Social Work Placement (Signed)
   CLINICAL SOCIAL WORK PLACEMENT  NOTE  Date:  08/24/2016  Patient Details  Name: Katherine [name] MRN: [id number] Date of Birth: 02-28-1930  Clinical Social Work is seeking post-discharge placement for this patient at the [facility] level of care (*CSW will initial, date and re-position this form in  chart as items are completed):  Yes   Patient/family provided with [facility] Work Department's list of facilities offering this level of care within the geographic area requested by the patient (or if unable, by the patient's family).  Yes   Patient/family informed of their freedom to choose among providers that offer the needed level of care, that participate in Medicare, Medicaid or managed care program needed by the patient, have an available bed and are willing to accept the patient.  Yes   Patient/family informed of [facility]'s ownership interest in [facility] and [facility], as well as of the fact that they are under no obligation to receive care at these facilities.  PASRR submitted to EDS on 08/24/16     PASRR number received on 08/24/16     Existing PASRR number confirmed on       FL2 transmitted to all facilities in geographic area requested by pt/family on 08/24/16     FL2 transmitted to all facilities within larger geographic area on       Patient informed that his/her managed care company has contracts with or will negotiate with certain facilities, including the following:            Patient/family informed of bed offers received.  Patient chooses bed at       Physician recommends and patient chooses bed at      Patient to be transferred to   on  .  Patient to be transferred to facility by       Patient family notified on   of transfer.  Name of family member notified:        PHYSICIAN       Additional Comment:    _______________________________________________ Cordale Manera, Veronia Beets, LCSW 08/24/2016, 4:39 PM

## 2016-08-24 NOTE — Transfer of Care (Signed)
Immediate Anesthesia Transfer of Care Note  Patient: Katherine [name]  Procedure(s) Performed: Procedure(s): OPEN REDUCTION INTERNAL FIXATION (ORIF) ANKLE FRACTURE (Right)  Patient Location: PACU  Anesthesia Type:Spinal  Level of Consciousness: awake and alert   Airway & Oxygen Therapy: Patient Spontanous Breathing and Patient connected to face mask oxygen  Post-op Assessment: Report given to RN and Post -op Vital signs reviewed and stable  Post vital signs: Reviewed  Last Vitals:  Vitals:   08/24/16 0753 08/24/16 1128  BP: (!) 140/50 (!) 95/45  Pulse: 65 63  Resp:  12  Temp: 37.1 C 37.7 C    Last Pain:  Vitals:   08/24/16 0753  TempSrc: Oral  PainSc:          Complications: No apparent anesthesia complications

## 2016-08-24 NOTE — Op Note (Signed)
08/23/2016 - 08/24/2016  11:32 AM  PATIENT:  Katherine [name]    PRE-OPERATIVE DIAGNOSIS:  RIGHT BIMALLEOLAR ANKLE FRACTURE   POST-OPERATIVE DIAGNOSIS:  Same  PROCEDURE:  OPEN REDUCTION INTERNAL FIXATION (ORIF) RIGHT ANKLE FRACTURE  SURGEON:  Thornton Park, MD  ANESTHESIA:   General  PREOPERATIVE INDICATIONS:  Katherine [name] is a  83 y.o. female with a diagnosis of RIGHT BIMALLEOLAR ANKLE FRACTURE who failed conservative measures and elected for surgical management.    I discussed the risks and benefits of surgery. The risks include but are not limited to infection, bleeding requiring blood transfusion, nerve or blood vessel injury, joint stiffness or loss of motion, persistent pain, weakness or instability, malunion, nonunion and hardware failure and the need for further surgery. Medical risks include but are not limited to DVT and pulmonary embolism, myocardial infarction, stroke, pneumonia, respiratory failure and death. Patient understood these risks and wished to proceed.   OPERATIVE IMPLANTS: Synthes 10 hole one third tubular plate, Synthes cannulated 4.0 screws 2  OPERATIVE FINDINGS: Displaced bimalleolar ankle fracture with lateral talar subluxation.  Lateral malleolar fracture was significantly comminuted.  OPERATIVE PROCEDURE:   Patient was met in the preoperative area. The right leg was signed my initials and the word yes according the hospital's correct site of surgery protocol. Patient was brought to the operating room where she underwent spinal anesthesia. The patient was placed supine on the operative table. A bump was placed under the right hip. A tourniquet was applied to the right thigh.  The lower extremity was prepped and draped in a sterile fashion. A timeout was performed to verify the patient's name, date of birth, medical record number, correct site of surgery and correct procedure to be performed. It was also used to verify the patient received antibiotics, and that all  appropriate instruments, implants and radiographic studies were available in the room. Once all in attendance were in agreement, the case began.  The right lower extremity was exsanguinated with an Esmarch. The tourniquet was inflated to 275 mmHg. This was applied for a total of 90 minutes. A lateral incision was made over the fibula. The subcutaneous tissues were dissected with the Metzenbaum scissor and pickup. Care was taken to avoid injury to the superficial peroneal nerve. The lateral malleolus fracture was identified and found to be significantly comminuted. The bone was also osteoporotic. Given the comminution a lag screw could not be used. Instead a 10 hole one third tubular plate was contoured to the lateral malleolus. FluoroScan images were used to ensure adequate contour of the plate and acceptable reduction of the fracture.  Bicortical screws were placed proximal to the fracture and fully threaded cancellus screws were placed distal the fracture. Only 2 fully threaded cancellous screws could be placed distal to the fracture. The fracture reduction and hardware placement were confirmed on AP and lateral imaging.  Once the lateral malleolus was plated, the attention was turned to the medial ankle. A small vertical incision was made over the tip of the medial malleolus.  Soft tissue was dissected with some with the Metzenbaum scissor and pickup. The fracture of the medial malleolus was identified. This was reduced with a dental pick. 2 threaded K wires for the 4.0 cannulated screws were then advanced through the tip of the medial malleolus across the fracture site and into the distal tibia. The position of the K wires was evaluated on AP and lateral FluoroScan images. The length of the wires were measured with a depth  gauge and were determined to be 40 and  34 mm in length. The wires were then overdrilled with a cannulated drill for the 4.0 cannulated screws. The long threaded 4.0 cannulated screws were  then advanced into position by hand, compressing the medial malleolus fracture.    The posterior malleolus was then examined under fluoroscopy. It was felt to be less than 20% of the articular surface and was in a near anatomic position. The decision was made made not to place an AP screw given its stability and small size.  A stress test of the right ankle was then performed under fluoroscopy.  This test did not reveal any syndesmotic injury or opening of the medial clear space.  A Cotton test was also performed which showed no evidence of widening of the syndesmosis.  The medial and lateral incisions were then copiously irrigated. The subcutaneous tissue was closed with 2-0 Vicryl and the skin approximated staples. A dry sterile dressing was applied along with an AO splint. The patient's ankle was positioned in neutral. The pateint was then awoken from anesthesia, transferred to hospital bed and brought to the PACU in stable condition. I was scrubbed and present the entire case and all sharp and instrument counts were correct at conclusion the case. I called the patient's brother postoperatively and spoke with his wife on the telephone to let her know the case was performed without complication and the patient was stable in recovery room.    Timoteo Gaul, MD

## 2016-08-24 NOTE — Anesthesia Procedure Notes (Signed)
Spinal  Patient location during procedure: OR Staffing Anesthesiologist: Gunnar Bulla Resident/CRNA: Rolla Plate Performed: resident/CRNA  Preanesthetic Checklist Completed: patient identified, site marked, surgical consent, pre-op evaluation, timeout performed, IV checked, risks and benefits discussed and monitors and equipment checked Spinal Block Patient position: sitting Prep: ChloraPrep and site prepped and draped Patient monitoring: heart rate, continuous pulse ox, blood pressure and cardiac monitor Approach: midline Location: L4-5 Injection technique: single-shot Needle Needle type: Introducer and Pencan  Needle gauge: 24 G Needle length: 9 cm Assessment Sensory level: T10 Additional Notes Negative paresthesia. Negative blood return. Positive free-flowing CSF. Expiration date of kit checked and confirmed. Patient tolerated procedure well, without complications.

## 2016-08-24 NOTE — Anesthesia Postprocedure Evaluation (Signed)
Anesthesia Post Note  Patient: Katherine [name]  Procedure(s) Performed: Procedure(s) (LRB): OPEN REDUCTION INTERNAL FIXATION (ORIF) ANKLE FRACTURE (Right)  Patient location during evaluation: PACU Anesthesia Type: Spinal Level of consciousness: oriented and awake and alert Pain management: pain level controlled Vital Signs Assessment: post-procedure vital signs reviewed and stable Respiratory status: spontaneous breathing, respiratory function stable and patient connected to nasal cannula oxygen Cardiovascular status: blood pressure returned to baseline and stable Postop Assessment: no headache and no backache Anesthetic complications: no     Last Vitals:  Vitals:   08/24/16 1325 08/24/16 1405  BP: (!) 154/53 (!) 143/52  Pulse: 61 (!) 59  Resp: 16 18  Temp: 37.9 C 37.8 C    Last Pain:  Vitals:   08/24/16 1506  TempSrc:   PainSc: 5         RLE Motor Response: Purposeful movement (08/24/16 1614) RLE Sensation: Full sensation (08/24/16 1614)      [name]

## 2016-08-24 NOTE — Progress Notes (Signed)
[facility] NAME: Katherine [name]    MR#:  [id number]  DATE OF BIRTH:  [birth year]  SUBJECTIVE:  CHIEF COMPLAINT:   Chief Complaint  Patient presents with  . Ankle Pain    Came after an accidental fracture on her right ankle.   Status post surgery today. No complaints.  REVIEW OF SYSTEMS:  CONSTITUTIONAL: No fever, fatigue or weakness.  EYES: No blurred or double vision.  EARS, NOSE, AND THROAT: No tinnitus or ear pain.  RESPIRATORY: No cough, shortness of breath, wheezing or hemoptysis.  CARDIOVASCULAR: No chest pain, orthopnea, edema.  GASTROINTESTINAL: No nausea, vomiting, diarrhea or abdominal pain.  GENITOURINARY: No dysuria, hematuria.  ENDOCRINE: No polyuria, nocturia,  HEMATOLOGY: No anemia, easy bruising or bleeding SKIN: No rash or lesion. MUSCULOSKELETAL: No joint pain or arthritis.   NEUROLOGIC: No tingling, numbness, weakness.  PSYCHIATRY: No anxiety or depression.   ROS  DRUG ALLERGIES:  No Known Allergies  VITALS:  Blood pressure (!) 143/52, pulse (!) 59, temperature 100 F (37.8 C), temperature source Axillary, resp. rate 18, height 5\' 1"  (1.549 m), weight 61.7 kg (136 lb), SpO2 97 %.  PHYSICAL EXAMINATION:  GENERAL:  83 y.o.-year-old patient lying in the bed with no acute distress.  EYES: Pupils equal, round, reactive to light and accommodation. No scleral icterus. Extraocular muscles intact.  HEENT: Head atraumatic, normocephalic. Oropharynx and nasopharynx clear.  NECK:  Supple, no jugular venous distention. No thyroid enlargement, no tenderness.  LUNGS: Normal breath sounds bilaterally, no wheezing, rales,rhonchi or crepitation. No use of accessory muscles of respiration.  CARDIOVASCULAR: S1, S2 normal. No murmurs, rubs, or gallops.  ABDOMEN: Soft, nontender, nondistended. Bowel sounds present. No organomegaly or mass.  EXTREMITIES: No pedal edema, cyanosis, or clubbing. Right ankle dressing  present. NEUROLOGIC: Cranial nerves II through XII are intact. Muscle strength 5/5 in all extremities. Sensation intact. Gait not checked.  PSYCHIATRIC: The patient is alert and oriented x 3.  SKIN: No obvious rash, lesion, or ulcer.   Physical Exam LABORATORY PANEL:   CBC  Recent Labs Lab 08/24/16 0409  WBC 10.0  HGB 11.3*  HCT 33.6*  PLT 138*   ------------------------------------------------------------------------------------------------------------------  Chemistries   Recent Labs Lab 08/23/16 1200 08/24/16 0409  NA 141 138  K 4.2 4.3  CL 105 104  CO2 29 29  GLUCOSE 138* 112*  BUN 21* 19  CREATININE 0.99 0.96  CALCIUM 9.4 8.9  AST 68*  --   ALT 47  --   ALKPHOS 79  --   BILITOT 0.5  --    ------------------------------------------------------------------------------------------------------------------  Cardiac Enzymes No results for input(s): TROPONINI in the last 168 hours. ------------------------------------------------------------------------------------------------------------------  RADIOLOGY:  Dg Chest 1 View  Result Date: 08/23/2016 CLINICAL DATA:  Preop for ankle surgery. EXAM: CHEST 1 VIEW COMPARISON:  Radiographs of March 11, 2016. FINDINGS: Stable cardiomediastinal silhouette. Atherosclerosis of thoracic aorta is noted. No pneumothorax or pleural effusion is noted. Both lungs are clear. The visualized skeletal structures are unremarkable. IMPRESSION: No acute cardiopulmonary abnormality seen. Electronically Signed   By: Marijo Conception, M.D.   On: 08/23/2016 13:06   Dg Ankle Complete Right  Result Date: 08/24/2016 CLINICAL DATA:  83 year old female status post ORIF of the right ankle EXAM: RIGHT ANKLE - COMPLETE 3+ VIEW COMPARISON:  Preoperative radiographs 08/23/2016 FINDINGS: Interval open reduction and internal fixation of a by malleolar fracture dislocation. The lateral malleolar fracture is now transfixed by a lateral buttress plate and screw  construct. Two cannulated lag screws transfix the medial malleolar fracture. Alignment has been reduced and is now anatomic. The talar dome remains intact. The ankle mortise is now congruent. Plaster casting material projects over the lower leg and ankle. IMPRESSION: ORIF of bimalleolar fracture subluxation without evidence of hardware complication as described above. Electronically Signed   By: Jacqulynn Cadet M.D.   On: 08/24/2016 11:51   Dg Ankle Complete Right  Result Date: 08/23/2016 CLINICAL DATA:  Injury after stepping wrong  unable to ambulate EXAM: RIGHT ANKLE - COMPLETE 3+ VIEW COMPARISON:  None. FINDINGS: Three views of the left ankle submitted. There is mild displaced angulated comminuted fracture in distal fibula. Mild displaced fracture in distal tibia medial malleolus. There is disruption of ankle mortise with medial displacement of distal tibia. I suspect there is a small fracture in distal tibia posterior malleolus. Please see the lateral view. IMPRESSION: There is mild displaced comminuted, mild angulated fracture in distal right fibula. Mild displaced fracture distal tibia medial malleolus with disruption of ankle mortise. I cannot exclude subtle fracture in distal tibia posterior malleolus. Electronically Signed   By: Lahoma Crocker M.D.   On: 08/23/2016 11:26    ASSESSMENT AND PLAN:   Active Problems:   Ankle fracture  1. trimalleolar fracture right ankle.    Status post surgery by orthopedic.   Pain management and DVT prophylaxis as per protocol through.   Physical therapy evaluation, may need rehabilitation placement likely. 2. History of coronary artery disease hold aspirin. Continue Coreg and statin. 3. Essential hypertension continue usual antihypertensive medications 4. GERD on PPI 5. Depression on Zoloft 6. Hyperlipidemia unspecified on atorvastatin   All the records are reviewed and case discussed with Care Management/Social Workerr. Management plans discussed with  the patient, family and they are in agreement.  CODE STATUS: Full  TOTAL TIME TAKING CARE OF THIS PATIENT: 50 minutes.     POSSIBLE D/C IN 1-2 DAYS, DEPENDING ON CLINICAL CONDITION.   Vaughan Basta M.D on 08/24/2016   Between 7am to 6pm - Pager - [phone]  After 6pm go to [email or web address] - password [email or web address] [facility]  Office  [phone]  CC: Primary care physician; Coral Spikes, DO  Note: This dictation was prepared with Dragon dictation along with smaller phrase technology. Any transcriptional errors that result from this process are unintentional.

## 2016-08-24 NOTE — Progress Notes (Signed)
Subjective:  Patient reports right ankle pain as moderate.    Objective:   VITALS:   Vitals:   08/23/16 1952 08/23/16 1955 08/24/16 0448 08/24/16 0753  BP: (!) 144/40 127/60 (!) 114/48 (!) 140/50  Pulse: (!) 52 73 (!) 58 65  Resp: 19  19   Temp: 98.4 F (36.9 C)  99.1 F (37.3 C) 98.7 F (37.1 C)  TempSrc: Oral  Oral Oral  SpO2: 96%  92% 93%  Weight:      Height:        PHYSICAL EXAM:  Right lower extremity: Patient's dressing and splint remain clean dry and intact.  Toes remained perfused. Patient has intact sensation to light touch and has flexion extension of her toes. Patient has global weakness on the right side at baseline per the patient.   LABS  Results for orders placed or performed during the hospital encounter of 08/23/16 (from the past 24 hour(s))  CBC with Differential     Status: Abnormal   Collection Time: 08/23/16 12:00 PM  Result Value Ref Range   WBC 9.7 3.6 - 11.0 K/uL   RBC 4.10 3.80 - 5.20 MIL/uL   Hemoglobin 12.7 12.0 - 16.0 g/dL   HCT 38.3 35.0 - 47.0 %   MCV 93.5 80.0 - 100.0 fL   MCH 30.9 26.0 - 34.0 pg   MCHC 33.0 32.0 - 36.0 g/dL   RDW 13.5 11.5 - 14.5 %   Platelets 155 150 - 440 K/uL   Neutrophils Relative % 71 %   Neutro Abs 6.9 (H) 1.4 - 6.5 K/uL   Lymphocytes Relative 16 %   Lymphs Abs 1.5 1.0 - 3.6 K/uL   Monocytes Relative 9 %   Monocytes Absolute 0.9 0.2 - 0.9 K/uL   Eosinophils Relative 4 %   Eosinophils Absolute 0.4 0 - 0.7 K/uL   Basophils Relative 0 %   Basophils Absolute 0.0 0 - 0.1 K/uL  Comprehensive metabolic panel     Status: Abnormal   Collection Time: 08/23/16 12:00 PM  Result Value Ref Range   Sodium 141 135 - 145 mmol/L   Potassium 4.2 3.5 - 5.1 mmol/L   Chloride 105 101 - 111 mmol/L   CO2 29 22 - 32 mmol/L   Glucose, Bld 138 (H) 65 - 99 mg/dL   BUN 21 (H) 6 - 20 mg/dL   Creatinine, Ser 0.99 0.44 - 1.00 mg/dL   Calcium 9.4 8.9 - 10.3 mg/dL   Total Protein 6.9 6.5 - 8.1 g/dL   Albumin 4.0 3.5 - 5.0 g/dL   AST 68 (H) 15 - 41 U/L   ALT 47 14 - 54 U/L   Alkaline Phosphatase 79 38 - 126 U/L   Total Bilirubin 0.5 0.3 - 1.2 mg/dL   GFR calc non Af Amer 51 (L) >60 mL/min   GFR calc Af Amer 59 (L) >60 mL/min   Anion gap 7 5 - 15  Urinalysis, Complete w Microscopic     Status: Abnormal   Collection Time: 08/23/16 12:00 PM  Result Value Ref Range   Color, Urine YELLOW (A) YELLOW   APPearance CLEAR (A) CLEAR   Specific Gravity, Urine 1.008 1.005 - 1.030   pH 6.0 5.0 - 8.0   Glucose, UA NEGATIVE NEGATIVE mg/dL   Hgb urine dipstick NEGATIVE NEGATIVE   Bilirubin Urine NEGATIVE NEGATIVE   Ketones, ur NEGATIVE NEGATIVE mg/dL   Protein, ur NEGATIVE NEGATIVE mg/dL   Nitrite NEGATIVE NEGATIVE   Leukocytes, UA NEGATIVE NEGATIVE  RBC / HPF 0-5 0 - 5 RBC/hpf   WBC, UA 0-5 0 - 5 WBC/hpf   Bacteria, UA NONE SEEN NONE SEEN   Squamous Epithelial / LPF NONE SEEN NONE SEEN  Surgical pcr screen     Status: None   Collection Time: 08/23/16  7:47 PM  Result Value Ref Range   MRSA, PCR NEGATIVE NEGATIVE   Staphylococcus aureus NEGATIVE NEGATIVE  Basic metabolic panel     Status: Abnormal   Collection Time: 08/24/16  4:09 AM  Result Value Ref Range   Sodium 138 135 - 145 mmol/L   Potassium 4.3 3.5 - 5.1 mmol/L   Chloride 104 101 - 111 mmol/L   CO2 29 22 - 32 mmol/L   Glucose, Bld 112 (H) 65 - 99 mg/dL   BUN 19 6 - 20 mg/dL   Creatinine, Ser 0.96 0.44 - 1.00 mg/dL   Calcium 8.9 8.9 - 10.3 mg/dL   GFR calc non Af Amer 53 (L) >60 mL/min   GFR calc Af Amer >60 >60 mL/min   Anion gap 5 5 - 15  CBC     Status: Abnormal   Collection Time: 08/24/16  4:09 AM  Result Value Ref Range   WBC 10.0 3.6 - 11.0 K/uL   RBC 3.61 (L) 3.80 - 5.20 MIL/uL   Hemoglobin 11.3 (L) 12.0 - 16.0 g/dL   HCT 33.6 (L) 35.0 - 47.0 %   MCV 93.2 80.0 - 100.0 fL   MCH 31.3 26.0 - 34.0 pg   MCHC 33.6 32.0 - 36.0 g/dL   RDW 13.2 11.5 - 14.5 %   Platelets 138 (L) 150 - 440 K/uL    Dg Chest 1 View  Result Date: 08/23/2016 CLINICAL  DATA:  Preop for ankle surgery. EXAM: CHEST 1 VIEW COMPARISON:  Radiographs of March 11, 2016. FINDINGS: Stable cardiomediastinal silhouette. Atherosclerosis of thoracic aorta is noted. No pneumothorax or pleural effusion is noted. Both lungs are clear. The visualized skeletal structures are unremarkable. IMPRESSION: No acute cardiopulmonary abnormality seen. Electronically Signed   By: Marijo Conception, M.D.   On: 08/23/2016 13:06   Dg Ankle Complete Right  Result Date: 08/23/2016 CLINICAL DATA:  Injury after stepping wrong  unable to ambulate EXAM: RIGHT ANKLE - COMPLETE 3+ VIEW COMPARISON:  None. FINDINGS: Three views of the left ankle submitted. There is mild displaced angulated comminuted fracture in distal fibula. Mild displaced fracture in distal tibia medial malleolus. There is disruption of ankle mortise with medial displacement of distal tibia. I suspect there is a small fracture in distal tibia posterior malleolus. Please see the lateral view. IMPRESSION: There is mild displaced comminuted, mild angulated fracture in distal right fibula. Mild displaced fracture distal tibia medial malleolus with disruption of ankle mortise. I cannot exclude subtle fracture in distal tibia posterior malleolus. Electronically Signed   By: Lahoma Crocker M.D.   On: 08/23/2016 11:26    Assessment/Plan: Day of Surgery   Active Problems:   Ankle fracture  Patient has been medically cleared by cardiology and medicine for surgery. Patient's surgery is scheduled for this morning. I answered all the patient's questions.    Thornton Park , MD 08/24/2016, 8:58 AM

## 2016-08-24 NOTE — Anesthesia Preprocedure Evaluation (Addendum)
Anesthesia Evaluation  Patient identified by MRN, date of birth, ID band Patient awake    Reviewed: Allergy & Precautions, NPO status , Patient's Chart, lab work & pertinent test results, reviewed documented beta blocker date and time   Airway Mallampati: II  TM Distance: >3 FB     Dental  (+) Chipped   Pulmonary COPD,           Cardiovascular hypertension, Pt. on medications and Pt. on home beta blockers + CAD and + Past MI       Neuro/Psych    GI/Hepatic GERD  Controlled,  Endo/Other    Renal/GU      Musculoskeletal  (+) Arthritis ,   Abdominal   Peds  Hematology   Anesthesia Other Findings EF 35-40%.EKG no change. Poor R wave prog, otherwise OK. No cardiac symptoms. No NTG.  Reproductive/Obstetrics                            Anesthesia Physical Anesthesia Plan  ASA: III  Anesthesia Plan: General and Spinal   Post-op Pain Management:    Induction:   Airway Management Planned:   Additional Equipment:   Intra-op Plan:   Post-operative Plan:   Informed Consent: I have reviewed the patients History and Physical, chart, labs and discussed the procedure including the risks, benefits and alternatives for the proposed anesthesia with the patient or authorized representative who has indicated his/her understanding and acceptance.     Plan Discussed with: CRNA  Anesthesia Plan Comments:         Anesthesia Quick Evaluation

## 2016-08-24 NOTE — Clinical Social Work Note (Signed)
Clinical Social Work Assessment  Patient Details  Name: Katherine [name] MRN: [id number] Date of Birth: 01-01-1930  Date of referral:  08/24/16               Reason for consult:  Discharge Planning, Facility Placement                Permission sought to share information with:  Chartered certified accountant granted to share information::  Yes, Verbal Permission Granted  Name::      Katherine [name]::   Katherine [name]   Relationship::     Contact Information:     Housing/Transportation Living arrangements for the past 2 months:  [place] of Information:  Patient Patient Interpreter Needed:  None Criminal Activity/Legal Involvement Pertinent to Current Situation/Hospitalization:  No - Comment as needed Significant Relationships:  Adult Children, Other Family Members Lives with:  Self Do you feel safe going back to the place where you live?  Yes Need for family participation in patient care:  Yes (Comment)  Care giving concerns:  Patient is lives permanently in [place], but is visiting her sister in  [place] at this time.    Social Worker assessment / plan:  Social work Theatre manager received social work consult. Patient has not worked PT at this time. Social work Theatre manager met with patient at bedside. Patient was alert and oriented. Per patient, she is from [place], [place] where she lives permanently. Patient fell while visiting her sister that lives in [place]. Patient has four sons and one daughter that all live [place]. Patient's son Katherine [name] is patient's HPOA. Social work Theatre manager explained that PT will work with patient to determine if patient can go home or needs to go to a SNF for short-term rehab. Patient verbally said she was open to receiving home health at her sisters house or going to SNF as long as it was in [place].  Social work Theatre manager explained that with Radio producer, authorization would have to be started to see which  facility to can go to. Patient verbally agreed she understood. Social work Theatre manager will continue to assist as needed.  Fl2 completed and faxed out.   Employment status:  Unemployed Nurse, adult PT Recommendations:  Not assessed at this time Information / Referral to community resources:  [place]  Patient/Family's Response to care:  Patient is does not have a preference of which facility she wants to go to at this time and is okay with receiving home health at her sisters house if needed.   Patient/Family's Understanding of and Emotional Response to Diagnosis, Current Treatment, and Prognosis:  Patient was pleasant and thanked social work Theatre manager for coming by.   Emotional Assessment Appearance:  Appears stated age Attitude/Demeanor/Rapport:    Affect (typically observed):  Accepting, Adaptable, Appropriate Orientation:  Oriented to Self, Oriented to Place, Oriented to  Time, Oriented to Situation Alcohol / Substance use:  Not Applicable Psych involvement (Current and /or in the community):  No (Comment)  Discharge Needs  Concerns to be addressed:  Basic Needs Readmission within the last 30 days:  No Current discharge risk:  Dependent with Mobility Barriers to Discharge:  Continued Medical Work up   [employer], Student-Social Work 08/24/2016, 3:51 PM

## 2016-08-25 ENCOUNTER — Encounter: Payer: Self-pay | Admitting: Orthopedic Surgery

## 2016-08-25 DIAGNOSIS — R509 Fever, unspecified: Secondary | ICD-10-CM | POA: Diagnosis not present

## 2016-08-25 DIAGNOSIS — I2510 Atherosclerotic heart disease of native coronary artery without angina pectoris: Secondary | ICD-10-CM | POA: Diagnosis not present

## 2016-08-25 DIAGNOSIS — S82853A Displaced trimalleolar fracture of unspecified lower leg, initial encounter for closed fracture: Secondary | ICD-10-CM | POA: Diagnosis not present

## 2016-08-25 DIAGNOSIS — I10 Essential (primary) hypertension: Secondary | ICD-10-CM | POA: Diagnosis not present

## 2016-08-25 LAB — BASIC METABOLIC PANEL
ANION GAP: 9 (ref 5–15)
BUN: 13 mg/dL (ref 6–20)
CHLORIDE: 104 mmol/L (ref 101–111)
CO2: 24 mmol/L (ref 22–32)
CREATININE: 0.82 mg/dL (ref 0.44–1.00)
Calcium: 8.6 mg/dL — ABNORMAL LOW (ref 8.9–10.3)
GFR calc non Af Amer: >60 mL/min (ref >60)
Glucose, Bld: 138 mg/dL — ABNORMAL HIGH (ref 65–99)
POTASSIUM: 3.5 mmol/L (ref 3.5–5.1)
SODIUM: 137 mmol/L (ref 135–145)

## 2016-08-25 LAB — CBC
HCT: 35.1 % (ref 35.0–47.0)
HEMOGLOBIN: 11.7 g/dL — AB (ref 12.0–16.0)
MCH: 31.1 pg (ref 26.0–34.0)
MCHC: 33.4 g/dL (ref 32.0–36.0)
MCV: 93.1 fL (ref 80.0–100.0)
Platelets: 122 10*3/uL — ABNORMAL LOW (ref 150–440)
RBC: 3.77 MIL/uL — AB (ref 3.80–5.20)
RDW: 13.3 % (ref 11.5–14.5)
WBC: 13.3 10*3/uL — AB (ref 3.6–11.0)

## 2016-08-25 NOTE — Progress Notes (Signed)
Pt febrile fever 102. 650 tylenol administered

## 2016-08-25 NOTE — Progress Notes (Signed)
PT is recommending SNF. Clinical Education officer, museum (CSW) met with patient and presented bed offers. Patient chose [facility]. Tiffany admissions coordinator at [facility] is aware of accepted bed offer and will start [place] authorization today. CSW contacted patient's brother Chrissie Noa and made him aware of above.  [employer], LCSW [phone]

## 2016-08-25 NOTE — Evaluation (Signed)
Occupational Therapy Evaluation Patient Details Name: Katherine [name] MRN: [id number] DOB: Oct 05, 1929 Today's Date: 08/25/2016    History of Present Illness 83yo female s/p R ankle fracture subsequent ORIF. PMH: CAD, essential HTN, GERD, depression, HLD   Clinical Impression   Pt is an 83yo female s/p R ankle fracture subsequent ORIF presenting with pain, decreased strength, activity tolerance, increased need for assistance with self care tasks with need for skilled OT services to address noted impairments in order to maximize functional independence with ADL in order to return to PLOF and minimize risk of falls. Recommend SNF placement for rehabilitation after hospital due to pt functional status and limited availability of assistance at pt's sister's home where pt is staying to visit sister until April.    Follow Up Recommendations  SNF    Equipment Recommendations  3 in 1 bedside commode    Recommendations for Other Services       Precautions / Restrictions Precautions Precautions: Fall Restrictions Weight Bearing Restrictions: Yes RLE Weight Bearing: Non weight bearing      Mobility Bed Mobility Overal bed mobility: Needs Assistance Bed Mobility: Supine to Sit;Sit to Supine     Supine to sit: Max assist Sit to supine: Max assist   General bed mobility comments: Pt did show some effort in getting herself to EOB using rails, but ultimately needed heavy assist   Transfers Overall transfer level: Needs assistance Equipment used: Rolling walker (2 wheeled) Transfers: Sit to/from Stand Sit to Stand: Total assist         General transfer comment: sit to stand not attempted during this session due to pt reported pain levels, anxious about getting up, fatigue    Balance Overall balance assessment: Needs assistance   Sitting balance-Leahy Scale: Poor       Standing balance-Leahy Scale: Zero                              ADL Overall ADL's : Needs  assistance/impaired Eating/Feeding: Set up;Bed level   Grooming: Set up;Bed level   Upper Body Bathing: Set up;Minimal assistance;Bed level   Lower Body Bathing: Bed level;Maximal assistance   Upper Body Dressing : Moderate assistance;Bed level   Lower Body Dressing: Maximal assistance;Bed level     Toilet Transfer Details (indicate cue type and reason): Not attempted due to safety based on pt pain levels, fatigue and decreased strength         Functional mobility during ADLs: Maximal assistance General ADL Comments: Pt was modified indep at baseline, needed assistance for tub transfers at baseline from neice and was able to perform all other aspects of ADL tasks indep.     Vision Vision Assessment?: Yes Eye Alignment: Within Functional Limits Ocular Range of Motion: Restricted looking up (pt reports difficulty looking up due to poor vision that lasics surgery will correct - unable to confirm this) Alignment/Gaze Preference: Within Defined Limits Tracking/Visual Pursuits: Decreased smoothness of vertical tracking Convergence: Within functional limits Visual Fields: No apparent deficits   Perception     Praxis      Pertinent Vitals/Pain Pain Assessment: 0-10 Pain Score: 8  Pain Location: Pt reported 9/10 prior to receiving pain meds, during most of session reported 0/10 until starting having "muscle spasms" in RLE causing 8/10 pain, reported to nsg at end of session Pain Intervention(s): Limited activity within patient's tolerance;Monitored during session;Premedicated before session;Patient requesting pain meds-RN notified     Hand Dominance  Left   Extremity/Trunk Assessment Upper Extremity Assessment Upper Extremity Assessment: RUE deficits/detail;LUE deficits/detail RUE Deficits / Details: WFL ROM, 3+/5 grossly, 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 LUE Deficits / Details: Shoulder flexion ROM limited to approx 90 degrees, 4-/5 grossly   Lower Extremity  Assessment Lower Extremity Assessment: Defer to PT evaluation RLE Deficits / Details: Pt is limited with what she is able to tolerate, very limited AROM on R (reports considerable limitations from birth) did not appear to have more than 2-/5 t/o R LE       Communication Communication Communication: No difficulties   Cognition Arousal/Alertness: Awake/alert Behavior During Therapy: WFL for tasks assessed/performed Overall Cognitive Status: Within Functional Limits for tasks assessed                 General Comments: Pt intially seemed confused, distracted - she was better able to particiapte as the exam went on   General Comments       Exercises Exercises: General Lower Extremity     Shoulder Instructions      Home Living Family/patient expects to be discharged to:: Private residence Living Arrangements: Alone Available Help at Discharge: Family;Available PRN/intermittently;Other (Comment) (neice available PRN, staying with sister) Type of Home: House Home Access: Ramped entrance     Home Layout: One level     Bathroom Shower/Tub: Tub/shower unit Shower/tub characteristics: Architectural technologist: Handicapped height (with bilat rails) Bathroom Accessibility: Yes How Accessible: Accessible via wheelchair;Accessible via walker Home Equipment: Wheelchair - manual;Other (comment);Shower seat;Walker - 2 wheels (rollator; shower seat has back rest, no arm rails)   Additional Comments: Pt lives in [place], in town visiting sister until April      Prior Functioning/Environment Level of Independence: Independent with assistive device(s)        Comments: Pt reports she uses either a 4-point cane or a rollator most of the time        OT Problem List: Decreased strength;Pain;Decreased range of motion;Decreased activity tolerance;Decreased knowledge of use of DME or AE   OT Treatment/Interventions: Self-care/ADL training;Therapeutic exercise;Energy  conservation;Patient/family education;DME and/or AE instruction    OT Goals(Current goals can be found in the care plan section) Acute Rehab OT Goals Patient Stated Goal: get better and go home OT Goal Formulation: With patient Time For Goal Achievement: 09/08/16 Potential to Achieve Goals: Fair  OT Frequency: Min 1X/week   Barriers to D/C: Decreased caregiver support  Pt's neice able to provide PRN assistance, unclear how much support pt's sister can provide       Co-evaluation              End of Session    Activity Tolerance: Patient limited by pain Patient left: in bed;with call bell/phone within reach;with bed alarm set   Time: 0930-1005 OT Time Calculation (min): 35 min Charges:  OT General Charges $OT Visit: 1 Procedure OT Evaluation $OT Eval Moderate Complexity: 1 Procedure G-Codes: OT G-codes **NOT FOR INPATIENT CLASS** Functional Assessment Tool Used: clinical judgment Functional Limitation: Self care Self Care Current Status [id number]): At least 60 percent but less than 80 percent impaired, limited or restricted Self Care Goal Status [id number]): At least 20 percent but less than 40 percent impaired, limited or restricted  Corky Sox, OTR/L 08/25/2016, 2:35 PM

## 2016-08-25 NOTE — Evaluation (Signed)
Physical Therapy Evaluation Patient Details Name: Katherine [name] MRN: [id number] DOB: January 30, 1930 Today's Date: 08/25/2016   History of Present Illness  83yo female s/p R ankle fracture subsequent ORIF. PMH: CAD, essential HTN, GERD, depression, HLD  Clinical Impression  Pt is very weak and limited with PT exam and during ~10 minutes of LE exercises apart from the assessment.  Pt was unable to attain standing even with max assist and cuing.  She simply did not have the strength/ability to use UEs and L LE appropriately to even come close to rising to standing and maintaining NWBing on the R.  Pt also having high pain sensitivity with even minimal R LE movement.  Pt completely unable/unsafe to go home and will need rehab.     Follow Up Recommendations SNF    Equipment Recommendations       Recommendations for Other Services       Precautions / Restrictions Precautions Precautions: Fall Restrictions Weight Bearing Restrictions: Yes RLE Weight Bearing: Non weight bearing      Mobility  Bed Mobility Overal bed mobility: Needs Assistance Bed Mobility: Supine to Sit;Sit to Supine     Supine to sit: Max assist Sit to supine: Max assist   General bed mobility comments: Pt did show some effort in getting herself to EOB using rails, but ultimately needed heavy assist   Transfers Overall transfer level: Needs assistance Equipment used: Rolling walker (2 wheeled) Transfers: Sit to/from Stand Sit to Stand: Total assist         General transfer comment: Pt is unable to achieve standing even with heavy assist.  She could not get her L LE to effectively take weight, struggled to use the walker appropriately and did not have the strength to even hold the R LE off the ground - pt very, very limited with mobility  Ambulation/Gait             General Gait Details: unable/unsafe  Stairs            Wheelchair Mobility    Modified Rankin (Stroke Patients Only)        Balance Overall balance assessment: Needs assistance   Sitting balance-Leahy Scale: Poor       Standing balance-Leahy Scale: Zero                               Pertinent Vitals/Pain Pain Assessment: 0-10 Pain Score: 6  (has considerable pain with any R LE movement)    Home Living Family/patient expects to be discharged to:: Skilled nursing facility Living Arrangements:  (pt from [place], visits/stays with sister in [place] in the winter) Available Help at Discharge: Family;Available PRN/intermittently;Other (Comment) (neice available PRN, staying with sister) Type of Home: House         Home Equipment: Walker - 4 wheels;Wheelchair - Education administrator (comment);Shower seat (rollator; shower seat has back rest, no arm rails) Additional Comments: Pt lives in [place], in town visiting sister until April    Prior Function Level of Independence: Independent with assistive device(s)         Comments: Pt reports she uses either a cane or a rollator most of the time     Hand Dominance        Extremity/Trunk Assessment   Upper Extremity Assessment Upper Extremity Assessment: Generalized weakness (b/l UEs limited elevation and grossly 3/5 in limited ROM)    Lower Extremity Assessment Lower Extremity Assessment: RLE deficits/detail (L LE  grossly 4/5 ) RLE Deficits / Details: Pt is limited with what she is able to tolerate, very limited AROM on R (reports considerable limitations from birth) did not appear to have more than 2-/5 t/o R LE       Communication   Communication: No difficulties  Cognition Arousal/Alertness: Lethargic Behavior During Therapy: Anxious Overall Cognitive Status: Within Functional Limits for tasks assessed                 General Comments: Pt intially seemed confused, distracted - she was better able to particiapte as the exam went on    General Comments      Exercises General Exercises - Lower Extremity Ankle Circles/Pumps: AROM;10  reps;Left (unable to even wiggle toes on R (unable since birth?)) Quad Sets: Strengthening;10 reps;Both Gluteal Sets: Strengthening;10 reps;Both Short Arc Quad: AAROM;10 reps;Right;PROM Heel Slides: AAROM;10 reps;Both Hip ABduction/ADduction: AAROM;AROM;10 reps;Both   Assessment/Plan    PT Assessment Patient needs continued PT services  PT Problem List Decreased strength;Decreased range of motion;Decreased activity tolerance;Decreased coordination;Decreased balance;Decreased mobility;Decreased cognition;Decreased knowledge of use of DME;Decreased safety awareness;Pain          PT Treatment Interventions DME instruction;Gait training;Stair training;Functional mobility training;Therapeutic activities;Therapeutic exercise;Balance training;Neuromuscular re-education;Patient/family education    PT Goals (Current goals can be found in the Care Plan section)  Acute Rehab PT Goals Patient Stated Goal: walk again PT Goal Formulation: With patient Time For Goal Achievement: 09/08/16 Potential to Achieve Goals: Fair    Frequency BID   Barriers to discharge        Co-evaluation               End of Session Equipment Utilized During Treatment: Gait belt Activity Tolerance: Patient limited by pain;Patient limited by fatigue Patient left: with bed alarm set;with call bell/phone within reach      Functional Assessment Tool Used: clinical judgement Functional Limitation: Mobility: Walking and moving around Mobility: Walking and Moving Around Current Status [id number]): 100 percent impaired, limited or restricted Mobility: Walking and Moving Around Goal Status [id number]): At least 40 percent but less than 60 percent impaired, limited or restricted    Time: 1022-1051 PT Time Calculation (min) (ACUTE ONLY): 29 min   Charges:   PT Evaluation $PT Eval Low Complexity: 1 Procedure PT Treatments $Therapeutic Exercise: 8-22 mins   PT G Codes:   PT G-Codes **NOT FOR INPATIENT  CLASS** Functional Assessment Tool Used: clinical judgement Functional Limitation: Mobility: Walking and moving around Mobility: Walking and Moving Around Current Status [id number]): 100 percent impaired, limited or restricted Mobility: Walking and Moving Around Goal Status [id number]): At least 40 percent but less than 60 percent impaired, limited or restricted    Kreg Shropshire, DPT 08/25/2016, 12:31 PM

## 2016-08-25 NOTE — Progress Notes (Signed)
Pt was lethargic at the beginning of the shift. She responded to voice. She slept all night. No pain medicine administered. Foley removed at 0400. Temp decreased after tylenol. No acute distress noted. Will continued to monitor

## 2016-08-25 NOTE — Progress Notes (Signed)
Physical Therapy Treatment Patient Details Name: Katherine [name] MRN: [id number] DOB: 19-Jan-1930 Today's Date: 08/25/2016    History of Present Illness 83yo female s/p R ankle fracture subsequent ORIF. PMH: CAD, essential HTN, GERD, depression, HLD    PT Comments    Pt shows great genuine effort t/o session and did do better and tolerate exercises with less pain that this AM but is still very limited.  She has good L LE strength, tolerated some more R LE exercises with less assist (though still AAROM for many acts) and was able to assist quite a bit more with bed mobility.  She still remains highly limited with attempts at standing and generally displays poor balance and ability to maintain NWBing in R while doing any upright activity.   Follow Up Recommendations  SNF     Equipment Recommendations       Recommendations for Other Services       Precautions / Restrictions Precautions Precautions: Fall Restrictions Weight Bearing Restrictions: Yes RLE Weight Bearing: Non weight bearing    Mobility  Bed Mobility Overal bed mobility: Needs Assistance Bed Mobility: Supine to Sit;Sit to Supine     Supine to sit: Mod assist Sit to supine: Mod assist   General bed mobility comments: Pt tolerated movement much better this afternoon and was able to use her UEs effectively on the rails to assist - pt very motivated  Transfers Overall transfer level: Needs assistance Equipment used: Rolling walker (2 wheeled) Transfers: Sit to/from Stand Sit to Stand: Total assist         General transfer comment: Pt shows better effort this afternoon, but still highly limited and unable to get her weight forward onto L LE and to walker via UEs.  She showed genuine effort but simply was unable to get to upright even with extremely heavy assist  Ambulation/Gait             General Gait Details: unable/unsafe   Stairs            Wheelchair Mobility    Modified Rankin (Stroke  Patients Only)       Balance Overall balance assessment: Needs assistance Sitting-balance support: Bilateral upper extremity supported Sitting balance-Leahy Scale: Poor Sitting balance - Comments: Pt leaning to the R, only able to hold herself upright for a few brief seconds before slouching back over     Standing balance-Leahy Scale: Zero                      Cognition Arousal/Alertness: Awake/alert Behavior During Therapy: WFL for tasks assessed/performed Overall Cognitive Status: Within Functional Limits for tasks assessed                 General Comments: Pt intially seemed confused, distracted - she was better able to particiapte as the exam went on    Exercises General Exercises - Lower Extremity Ankle Circles/Pumps: AROM;10 reps;Left Quad Sets: Strengthening;10 reps;Both Gluteal Sets: Strengthening;10 reps;Both Short Arc Quad: AAROM;AROM;Left;10 reps Heel Slides: AAROM;10 reps;Both;AROM (resisted leg extension on the L) Hip ABduction/ADduction: AROM;10 reps;Both Straight Leg Raises: AROM;10 reps;Left    General Comments        Pertinent Vitals/Pain Pain Assessment:  (unrated-less hypersensitive with R LE mvt, but still limited) Pain Score: 8  Pain Location: Pt reported 9/10 prior to receiving pain meds, during most of session reported 0/10 until starting having "muscle spasms" in RLE causing 8/10 pain, reported to nsg at end of session Pain Intervention(s): Limited  activity within patient's tolerance;Monitored during session;Premedicated before session;Patient requesting pain meds-RN notified    Home Living Family/patient expects to be discharged to:: Private residence Living Arrangements: Alone Available Help at Discharge: Family;Available PRN/intermittently;Other (Comment) (neice available PRN, staying with sister) Type of Home: House Home Access: Ramped entrance   Home Layout: One level Home Equipment: Wheelchair - manual;Other  (comment);Shower seat;Walker - 2 wheels (rollator; shower seat has back rest, no arm rails) Additional Comments: Pt lives in [place], in town visiting sister until April    Prior Function Level of Independence: Independent with assistive device(s)      Comments: Pt reports she uses either a 4-point cane or a rollator most of the time   PT Goals (current goals can now be found in the care plan section) Acute Rehab PT Goals Patient Stated Goal: get better and go home PT Goal Formulation: With patient Time For Goal Achievement: 09/08/16 Potential to Achieve Goals: Fair Progress towards PT goals: Progressing toward goals    Frequency    BID      PT Plan Current plan remains appropriate    Co-evaluation             End of Session Equipment Utilized During Treatment: Gait belt Activity Tolerance: Patient limited by fatigue;Patient tolerated treatment well Patient left: with bed alarm set;with call bell/phone within reach     Time: 1346-1414 PT Time Calculation (min) (ACUTE ONLY): 28 min  Charges:  $Therapeutic Exercise: 8-22 mins $Therapeutic Activity: 8-22 mins                    G Codes:  Functional Assessment Tool Used: clinical judgement Functional Limitation: Mobility: Walking and moving around Mobility: Walking and Moving Around Current Status [id number]): 100 percent impaired, limited or restricted Mobility: Walking and Moving Around Goal Status [id number]): At least 40 percent but less than 60 percent impaired, limited or restricted   Kreg Shropshire, DPT 08/25/2016, 3:47 PM

## 2016-08-25 NOTE — Progress Notes (Signed)
  Subjective:  POD #1 s/p right ankle ORIF.  Patient reports pain as mild.  Patient seen in bed. She appears somewhat confused. She denies any right ankle pain.  Objective:   VITALS:   Vitals:   08/25/16 0552 08/25/16 0821 08/25/16 1512 08/25/16 1939  BP:  (!) 102/40 (!) 139/47 (!) 127/42  Pulse:  (!) 52 (!) 58 66  Resp:   18 18  Temp: 99.2 F (37.3 C) 98.1 F (36.7 C) 98.7 F (37.1 C) 99.2 F (37.3 C)  TempSrc: Oral Oral Oral Oral  SpO2:  95% 98% 96%  Weight:      Height:        PHYSICAL EXAM:  Right lower extremity: Patient's AO splint is clean and dry. Her toes are well-perfused. She has intact sensation light touch. She has slight movement of her toes but has weakness to toe and ankle motion at baseline.   LABS  Results for orders placed or performed during the hospital encounter of 08/23/16 (from the past 24 hour(s))  CBC     Status: Abnormal   Collection Time: 08/25/16  5:38 AM  Result Value Ref Range   WBC 13.3 (H) 3.6 - 11.0 K/uL   RBC 3.77 (L) 3.80 - 5.20 MIL/uL   Hemoglobin 11.7 (L) 12.0 - 16.0 g/dL   HCT 35.1 35.0 - 47.0 %   MCV 93.1 80.0 - 100.0 fL   MCH 31.1 26.0 - 34.0 pg   MCHC 33.4 32.0 - 36.0 g/dL   RDW 13.3 11.5 - 14.5 %   Platelets 122 (L) 150 - 440 K/uL  Basic metabolic panel     Status: Abnormal   Collection Time: 08/25/16  5:38 AM  Result Value Ref Range   Sodium 137 135 - 145 mmol/L   Potassium 3.5 3.5 - 5.1 mmol/L   Chloride 104 101 - 111 mmol/L   CO2 24 22 - 32 mmol/L   Glucose, Bld 138 (H) 65 - 99 mg/dL   BUN 13 6 - 20 mg/dL   Creatinine, Ser 0.82 0.44 - 1.00 mg/dL   Calcium 8.6 (L) 8.9 - 10.3 mg/dL   GFR calc non Af Amer >60 >60 mL/min   GFR calc Af Amer >60 >60 mL/min   Anion gap 9 5 - 15    Dg Ankle Complete Right  Result Date: 08/24/2016 CLINICAL DATA:  82 year old female status post ORIF of the right ankle EXAM: RIGHT ANKLE - COMPLETE 3+ VIEW COMPARISON:  Preoperative radiographs 08/23/2016 FINDINGS: Interval open reduction  and internal fixation of a by malleolar fracture dislocation. The lateral malleolar fracture is now transfixed by a lateral buttress plate and screw construct. Two cannulated lag screws transfix the medial malleolar fracture. Alignment has been reduced and is now anatomic. The talar dome remains intact. The ankle mortise is now congruent. Plaster casting material projects over the lower leg and ankle. IMPRESSION: ORIF of bimalleolar fracture subluxation without evidence of hardware complication as described above. Electronically Signed   By: Jacqulynn Cadet M.D.   On: 08/24/2016 11:51    Assessment/Plan: 1 Day Post-Op   Active Problems:   Ankle fracture  Patient is stable postop. Continue elevation of right lower extremity. Continue physical therapy. Will reassess tomorrow.    Thornton Park , MD 08/25/2016, 10:04 PM

## 2016-08-25 NOTE — Progress Notes (Signed)
[facility] [place] NAME: Katherine [name]    MR#:  [id number]  DATE OF BIRTH:  03/24/1930  SUBJECTIVE:  CHIEF COMPLAINT:   Chief Complaint  Patient presents with  . Ankle Pain    Came after an accidental fracture on her right ankle.   Status post surgery . No complaints.  PT suggest SNF.  REVIEW OF SYSTEMS:  CONSTITUTIONAL: No fever, fatigue or weakness.  EYES: No blurred or double vision.  EARS, NOSE, AND THROAT: No tinnitus or ear pain.  RESPIRATORY: No cough, shortness of breath, wheezing or hemoptysis.  CARDIOVASCULAR: No chest pain, orthopnea, edema.  GASTROINTESTINAL: No nausea, vomiting, diarrhea or abdominal pain.  GENITOURINARY: No dysuria, hematuria.  ENDOCRINE: No polyuria, nocturia,  HEMATOLOGY: No anemia, easy bruising or bleeding SKIN: No rash or lesion. MUSCULOSKELETAL: No joint pain or arthritis.   NEUROLOGIC: No tingling, numbness, weakness.  PSYCHIATRY: No anxiety or depression.   ROS  DRUG ALLERGIES:  No Known Allergies  VITALS:  Blood pressure (!) 127/42, pulse 66, temperature 99.2 F (37.3 C), temperature source Oral, resp. rate 18, height 5\' 1"  (1.549 m), weight 61.7 kg (136 lb), SpO2 96 %.  PHYSICAL EXAMINATION:  GENERAL:  83 y.o.-year-old patient lying in the bed with no acute distress.  EYES: Pupils equal, round, reactive to light and accommodation. No scleral icterus. Extraocular muscles intact.  HEENT: Head atraumatic, normocephalic. Oropharynx and nasopharynx clear.  NECK:  Supple, no jugular venous distention. No thyroid enlargement, no tenderness.  LUNGS: Normal breath sounds bilaterally, no wheezing, rales,rhonchi or crepitation. No use of accessory muscles of respiration.  CARDIOVASCULAR: S1, S2 normal. No murmurs, rubs, or gallops.  ABDOMEN: Soft, nontender, nondistended. Bowel sounds present. No organomegaly or mass.  EXTREMITIES: No pedal edema, cyanosis, or clubbing. Right ankle dressing  present. NEUROLOGIC: Cranial nerves II through XII are intact. Muscle strength 5/5 in all extremities. Sensation intact. Gait not checked.  PSYCHIATRIC: The patient is alert and oriented x 3.  SKIN: No obvious rash, lesion, or ulcer.   Physical Exam LABORATORY PANEL:   CBC  Recent Labs Lab 08/25/16 0538  WBC 13.3*  HGB 11.7*  HCT 35.1  PLT 122*   ------------------------------------------------------------------------------------------------------------------  Chemistries   Recent Labs Lab 08/23/16 1200  08/25/16 0538  NA 141  < > 137  K 4.2  < > 3.5  CL 105  < > 104  CO2 29  < > 24  GLUCOSE 138*  < > 138*  BUN 21*  < > 13  CREATININE 0.99  < > 0.82  CALCIUM 9.4  < > 8.6*  AST 68*  --   --   ALT 47  --   --   ALKPHOS 79  --   --   BILITOT 0.5  --   --   < > = values in this interval not displayed. ------------------------------------------------------------------------------------------------------------------  Cardiac Enzymes No results for input(s): TROPONINI in the last 168 hours. ------------------------------------------------------------------------------------------------------------------  RADIOLOGY:  Dg Ankle Complete Right  Result Date: 08/24/2016 CLINICAL DATA:  83 year old female status post ORIF of the right ankle EXAM: RIGHT ANKLE - COMPLETE 3+ VIEW COMPARISON:  Preoperative radiographs 08/23/2016 FINDINGS: Interval open reduction and internal fixation of a by malleolar fracture dislocation. The lateral malleolar fracture is now transfixed by a lateral buttress plate and screw construct. Two cannulated lag screws transfix the medial malleolar fracture. Alignment has been reduced and is now anatomic. The talar dome remains intact. The ankle mortise is now  congruent. Plaster casting material projects over the lower leg and ankle. IMPRESSION: ORIF of bimalleolar fracture subluxation without evidence of hardware complication as described above. Electronically  Signed   By: Jacqulynn Cadet M.D.   On: 08/24/2016 11:51    ASSESSMENT AND PLAN:   Active Problems:   Ankle fracture  1. trimalleolar fracture right ankle.    Status post surgery by orthopedic.   Pain management and DVT prophylaxis as per protocol through.   Physical therapy evaluation, need rehabilitation placement, awaited insurance approval. 2. History of coronary artery disease hold aspirin. Continue Coreg and statin. 3. Essential hypertension continue usual antihypertensive medications 4. GERD on PPI 5. Depression on Zoloft 6. Hyperlipidemia unspecified on atorvastatin 7. Fever- check ua, xray, and do bl cx- for any more fever.  All the records are reviewed and case discussed with Care Management/Social Workerr. Management plans discussed with the patient, family and they are in agreement.  CODE STATUS: Full  TOTAL TIME TAKING CARE OF THIS PATIENT: 50 minutes.     POSSIBLE D/C IN 1-2 DAYS, DEPENDING ON CLINICAL CONDITION.   Vaughan Basta M.D on 08/25/2016   Between 7am to 6pm - Pager - [phone]  After 6pm go to [email or web address] - password [email or web address] [facility]  Office  [phone]  CC: Primary care physician; Coral Spikes, DO  Note: This dictation was prepared with Dragon dictation along with smaller phrase technology. Any transcriptional errors that result from this process are unintentional.

## 2016-08-25 NOTE — Care Management (Signed)
RNCM consult for home health. PT recommending SNF. CSW will follow. Will assist as needed.

## 2016-08-26 ENCOUNTER — Observation Stay: Payer: Medicare HMO

## 2016-08-26 DIAGNOSIS — I110 Hypertensive heart disease with heart failure: Secondary | ICD-10-CM | POA: Diagnosis not present

## 2016-08-26 DIAGNOSIS — R918 Other nonspecific abnormal finding of lung field: Secondary | ICD-10-CM | POA: Diagnosis not present

## 2016-08-26 DIAGNOSIS — R509 Fever, unspecified: Secondary | ICD-10-CM | POA: Diagnosis not present

## 2016-08-26 DIAGNOSIS — R531 Weakness: Secondary | ICD-10-CM | POA: Diagnosis not present

## 2016-08-26 DIAGNOSIS — S82853A Displaced trimalleolar fracture of unspecified lower leg, initial encounter for closed fracture: Secondary | ICD-10-CM | POA: Diagnosis not present

## 2016-08-26 DIAGNOSIS — M810 Age-related osteoporosis without current pathological fracture: Secondary | ICD-10-CM | POA: Diagnosis not present

## 2016-08-26 DIAGNOSIS — I2510 Atherosclerotic heart disease of native coronary artery without angina pectoris: Secondary | ICD-10-CM | POA: Diagnosis not present

## 2016-08-26 DIAGNOSIS — S99819A Other specified injuries of unspecified ankle, initial encounter: Secondary | ICD-10-CM | POA: Diagnosis not present

## 2016-08-26 DIAGNOSIS — J449 Chronic obstructive pulmonary disease, unspecified: Secondary | ICD-10-CM | POA: Diagnosis not present

## 2016-08-26 DIAGNOSIS — S82851D Displaced trimalleolar fracture of right lower leg, subsequent encounter for closed fracture with routine healing: Secondary | ICD-10-CM | POA: Diagnosis not present

## 2016-08-26 DIAGNOSIS — I252 Old myocardial infarction: Secondary | ICD-10-CM | POA: Diagnosis not present

## 2016-08-26 DIAGNOSIS — I10 Essential (primary) hypertension: Secondary | ICD-10-CM | POA: Diagnosis not present

## 2016-08-26 DIAGNOSIS — S82841A Displaced bimalleolar fracture of right lower leg, initial encounter for closed fracture: Secondary | ICD-10-CM | POA: Diagnosis not present

## 2016-08-26 DIAGNOSIS — W19XXXD Unspecified fall, subsequent encounter: Secondary | ICD-10-CM | POA: Diagnosis not present

## 2016-08-26 DIAGNOSIS — R69 Illness, unspecified: Secondary | ICD-10-CM | POA: Diagnosis not present

## 2016-08-26 DIAGNOSIS — I5042 Chronic combined systolic (congestive) and diastolic (congestive) heart failure: Secondary | ICD-10-CM | POA: Diagnosis not present

## 2016-08-26 DIAGNOSIS — E785 Hyperlipidemia, unspecified: Secondary | ICD-10-CM | POA: Diagnosis not present

## 2016-08-26 DIAGNOSIS — Z7401 Bed confinement status: Secondary | ICD-10-CM | POA: Diagnosis not present

## 2016-08-26 DIAGNOSIS — Z7982 Long term (current) use of aspirin: Secondary | ICD-10-CM | POA: Diagnosis not present

## 2016-08-26 DIAGNOSIS — J189 Pneumonia, unspecified organism: Secondary | ICD-10-CM | POA: Diagnosis not present

## 2016-08-26 LAB — URINALYSIS, ROUTINE W REFLEX MICROSCOPIC
BILIRUBIN URINE: NEGATIVE
GLUCOSE, UA: NEGATIVE mg/dL
KETONES UR: NEGATIVE mg/dL
LEUKOCYTES UA: NEGATIVE
NITRITE: NEGATIVE
PH: 5 (ref 5.0–8.0)
PROTEIN: 30 mg/dL — AB
Specific Gravity, Urine: 1.018 (ref 1.005–1.030)

## 2016-08-26 LAB — CBC
HEMATOCRIT: 31.8 % — AB (ref 35.0–47.0)
Hemoglobin: 10.5 g/dL — ABNORMAL LOW (ref 12.0–16.0)
MCH: 30.7 pg (ref 26.0–34.0)
MCHC: 33.2 g/dL (ref 32.0–36.0)
MCV: 92.6 fL (ref 80.0–100.0)
PLATELETS: 118 10*3/uL — AB (ref 150–440)
RBC: 3.43 MIL/uL — ABNORMAL LOW (ref 3.80–5.20)
RDW: 13.2 % (ref 11.5–14.5)
WBC: 11.9 10*3/uL — ABNORMAL HIGH (ref 3.6–11.0)

## 2016-08-26 LAB — BASIC METABOLIC PANEL
ANION GAP: 5 (ref 5–15)
BUN: 17 mg/dL (ref 6–20)
CALCIUM: 8.3 mg/dL — AB (ref 8.9–10.3)
CO2: 26 mmol/L (ref 22–32)
CREATININE: 0.9 mg/dL (ref 0.44–1.00)
Chloride: 105 mmol/L (ref 101–111)
GFR, EST NON AFRICAN AMERICAN: 57 mL/min — AB (ref >60)
Glucose, Bld: 135 mg/dL — ABNORMAL HIGH (ref 65–99)
Potassium: 3.6 mmol/L (ref 3.5–5.1)
SODIUM: 136 mmol/L (ref 135–145)

## 2016-08-26 LAB — PROCALCITONIN: Procalcitonin: 0.19 ng/mL

## 2016-08-26 MED ORDER — ENOXAPARIN SODIUM 40 MG/0.4ML ~~LOC~~ SOLN: 40.0000 mg | SUBCUTANEOUS | 0 refills | Status: DC

## 2016-08-26 MED ORDER — HYDROCODONE-ACETAMINOPHEN 5-325 MG PO TABS: 1.0000 | ORAL_TABLET | ORAL | 0 refills | Status: DC | PRN

## 2016-08-26 MED ORDER — FERROUS SULFATE 325 (65 FE) MG PO TABS: 325.0000 mg | ORAL_TABLET | Freq: Three times a day (TID) | ORAL | 3 refills | Status: DC

## 2016-08-26 MED ORDER — POLYETHYLENE GLYCOL 3350 17 G PO PACK: 17.0000 g | PACK | Freq: Every day | ORAL | 0 refills | Status: AC | PRN

## 2016-08-26 MED ORDER — LEVOFLOXACIN IN D5W 750 MG/150ML IV SOLN
750.0000 mg | INTRAVENOUS | Status: DC
  Administered 2016-08-26: 750 mg via INTRAVENOUS
  Filled 2016-08-26: qty 150

## 2016-08-26 MED ORDER — BISACODYL 10 MG RE SUPP: 10.0000 mg | Freq: Every day | RECTAL | 0 refills | Status: DC | PRN

## 2016-08-26 MED ORDER — DOCUSATE SODIUM 100 MG PO CAPS: 100.0000 mg | ORAL_CAPSULE | Freq: Two times a day (BID) | ORAL | 0 refills | Status: DC

## 2016-08-26 MED ORDER — LEVOFLOXACIN 500 MG PO TABS: 500.0000 mg | ORAL_TABLET | Freq: Every day | ORAL | 0 refills | Status: AC

## 2016-08-26 NOTE — Consult Note (Signed)
Pharmacy Antibiotic Note  Katherine [name] is a 83 y.o. female admitted on 08/23/2016 with pneumonia.  Pharmacy has been consulted for levofloxacin dosing. Pt w/ recent surgery for ankle fracture- spiked a fever post op. Chest x-ray suggested possible PNA, UA w/ rare bacteria, 0-5 WBC and TNC RBC  Plan: levofloxacin 750mg  q 48 hours  Height: 5\' 1"  (154.9 cm) Weight: 136 lb (61.7 kg) IBW/kg (Calculated) : 47.8  Temp (24hrs), Avg:98.8 F (37.1 C), Min:98.4 F (36.9 C), Max:99.2 F (37.3 C)   Recent Labs Lab 08/23/16 1200 08/24/16 0409 08/25/16 0538 08/26/16 0407  WBC 9.7 10.0 13.3* 11.9*  CREATININE 0.99 0.96 0.82 0.90    Estimated Creatinine Clearance: 39.2 mL/min (by C-G formula based on SCr of 0.9 mg/dL).    No Known Allergies  Antimicrobials this admission: levofloxacin 1/11 >>    Dose adjustments this admission:   Microbiology results:  1/8 MRSA PCR: neg  Chest x-ray: 1. Lower lung volumes with new streaky left lung base opacity, favor atelectasis but developing pneumonia is difficult to exclude. No pleural effusion.  Thank you for allowing pharmacy to be a part of this patient's care.  Ramond Dial, Pharm.D, BCPS Clinical Pharmacist  08/26/2016 9:14 AM

## 2016-08-26 NOTE — Progress Notes (Signed)
Subjective:  Postoperative day #2 status post ORIF of right ankle fracture. Patient reports pain as mild.    Objective:   VITALS:   Vitals:   08/25/16 1512 08/25/16 1939 08/26/16 0350 08/26/16 0758  BP: (!) 139/47 (!) 127/42 (!) 98/31 (!) 108/42  Pulse: (!) 58 66 (!) 55 (!) 52  Resp: 18 18 18 16   Temp: 98.7 F (37.1 C) 99.2 F (37.3 C) 98.7 F (37.1 C) 98.4 F (36.9 C)  TempSrc: Oral Oral  Oral  SpO2: 98% 96% 93% 93%  Weight:      Height:        PHYSICAL EXAM:  Right lower extremity: Patient has an AO splint in place. Dressings are clean dry and intact. Patient has intact sensation to light touch in her toes are well-perfused. She has minimal motion of her toes which is her baseline.   LABS  Results for orders placed or performed during the hospital encounter of 08/23/16 (from the past 24 hour(s))  Urinalysis, Routine w reflex microscopic     Status: Abnormal   Collection Time: 08/25/16 11:35 PM  Result Value Ref Range   Color, Urine YELLOW (A) YELLOW   APPearance CLEAR (A) CLEAR   Specific Gravity, Urine 1.018 1.005 - 1.030   pH 5.0 5.0 - 8.0   Glucose, UA NEGATIVE NEGATIVE mg/dL   Hgb urine dipstick LARGE (A) NEGATIVE   Bilirubin Urine NEGATIVE NEGATIVE   Ketones, ur NEGATIVE NEGATIVE mg/dL   Protein, ur 30 (A) NEGATIVE mg/dL   Nitrite NEGATIVE NEGATIVE   Leukocytes, UA NEGATIVE NEGATIVE   RBC / HPF TOO NUMEROUS TO COUNT 0 - 5 RBC/hpf   WBC, UA 0-5 0 - 5 WBC/hpf   Bacteria, UA RARE (A) NONE SEEN   Squamous Epithelial / LPF 0-5 (A) NONE SEEN   Mucous PRESENT   CBC     Status: Abnormal   Collection Time: 08/26/16  4:07 AM  Result Value Ref Range   WBC 11.9 (H) 3.6 - 11.0 K/uL   RBC 3.43 (L) 3.80 - 5.20 MIL/uL   Hemoglobin 10.5 (L) 12.0 - 16.0 g/dL   HCT 31.8 (L) 35.0 - 47.0 %   MCV 92.6 80.0 - 100.0 fL   MCH 30.7 26.0 - 34.0 pg   MCHC 33.2 32.0 - 36.0 g/dL   RDW 13.2 11.5 - 14.5 %   Platelets 118 (L) 150 - 440 K/uL  Basic metabolic panel     Status:  Abnormal   Collection Time: 08/26/16  4:07 AM  Result Value Ref Range   Sodium 136 135 - 145 mmol/L   Potassium 3.6 3.5 - 5.1 mmol/L   Chloride 105 101 - 111 mmol/L   CO2 26 22 - 32 mmol/L   Glucose, Bld 135 (H) 65 - 99 mg/dL   BUN 17 6 - 20 mg/dL   Creatinine, Ser 0.90 0.44 - 1.00 mg/dL   Calcium 8.3 (L) 8.9 - 10.3 mg/dL   GFR calc non Af Amer 57 (L) >60 mL/min   GFR calc Af Amer >60 >60 mL/min   Anion gap 5 5 - 15  Procalcitonin - Baseline     Status: None   Collection Time: 08/26/16  9:50 AM  Result Value Ref Range   Procalcitonin 0.19 ng/mL    Dg Chest 2 View  Result Date: 08/26/2016 CLINICAL DATA:  83 year old female with fever status post right ankle ORIF. Initial encounter. EXAM: CHEST  2 VIEW COMPARISON:  08/23/2016 and earlier. FINDINGS: Seated AP and  lateral views of the chest. Lower lung volumes with streaky left lower lobe opacity. No definite pleural effusion. Stable cardiomegaly and mediastinal contours. No pneumothorax or pulmonary edema. The right lung appears clear. Negative visible bowel gas pattern. No acute osseous abnormality identified. IMPRESSION: 1. Lower lung volumes with new streaky left lung base opacity, favor atelectasis but developing pneumonia is difficult to exclude. No pleural effusion. 2. Stable cardiomegaly. Electronically Signed   By: Genevie Ann M.D.   On: 08/26/2016 07:54    Assessment/Plan: 2 Days Post-Op   Active Problems:   Ankle fracture  Patient has done well postop. She is being discharged to SNF. She will remain nonweightbearing on the right lower extremity until follow-up with me in approximately 10-14 days. Patient should keep her dressing clean dry and intact.    Thornton Park , MD 08/26/2016, 1:22 PM

## 2016-08-26 NOTE — Progress Notes (Signed)
Patient is medically stable for discharge today to [facility]. Per, [facility] admissions coordinator at [facility], patient can come to room 501. Clinical Education officer, museum (CSW) sent D/C orders in [place]. Social work Theatre manager met with patient and patient's niece Vita at bedside and explained that patient will be discharged today. Social work Theatre manager also explained that Personal assistant granted a 5 day [place] ([place]) for her stay at [facility] as [employer] authorization is still pending. Social work Theatre manager explained that if [employer] authorization has not been received within 5 days, patient will be discharged home from [facility] . Patient verbally agreed she understood. RN will call report and arrange EMS for transport. Please re-consult if future social work needs arise. Social work Theatre manager singing off.   Winn Jock, Social Work Intern  [phone]

## 2016-08-26 NOTE — Discharge Summary (Signed)
[facility] at [place] NAME: Katherine [name]    MR#:  [id number]  DATE OF BIRTH:  [birth year]  DATE OF ADMISSION:  08/23/2016 ADMITTING PHYSICIAN: Thornton Park, MD  DATE OF DISCHARGE: 08/26/2016  PRIMARY CARE PHYSICIAN: Coral Spikes, DO    ADMISSION DIAGNOSIS:  Trimalleolar fracture of ankle, closed, right, initial encounter [S82.851A]  DISCHARGE DIAGNOSIS:  Active Problems:   Ankle fracture   Pneumonia  SECONDARY DIAGNOSIS:   Past Medical History:  Diagnosis Date  . Arthritis   . Chicken pox   . Chronic combined systolic and diastolic heart failure ([place])    a. 10/2015 Echo: EF 35-40%, Akinesis of mid-apicalanteroseptal wall & AK of apex, Gr 1DD.  Marland Kitchen COPD (chronic obstructive pulmonary disease) ([place])    worked in a [place]  . GERD (gastroesophageal reflux disease)   . Hyperlipidemia   . Hypertensive heart disease   . Ischemic cardiomyopathy    a. 10/2015 Echo: EF 35-40%, Akinesis of mid-apicalanteroseptal wall & AK of apex, Gr 1DD.  Marland Kitchen Occlusive coronary artery disease requiring drug therapy    a. Remote MI - never sought treatment when it occurred, shows up old on EKG;  b. Cath 10/2015 - 100% pLAD CTO with 80% D1, prox Cx 60% (FFR 0.92-->med Rx) - EF ~35%    HOSPITAL COURSE:   1. trimalleolar fracture right ankle.    Status post surgery by orthopedic.   Pain management and DVT prophylaxis as per protocol through.   Physical therapy evaluation, need rehabilitation placement, got insurance approval.   Follow in ortho clinic in 2 weeks. 2. History of coronary artery disease hold aspirin. Continue Coreg and statin. 3. Essential hypertension continue usual antihypertensive medications 4. GERD on PPI 5. Depression on Zoloft 6. Hyperlipidemia unspecified on atorvastatin 7. Fever- negative ua, xray chest shows some increased streaking on left lung- likely early pneumonia.   Encourage to use incentive spirometer. Started levaquin.  Finish 5 days course.    Afebrile for >24 hours.  DISCHARGE CONDITIONS:   Stable.  CONSULTS OBTAINED:  Treatment Team:  Thornton Park, MD [place], MD  DRUG ALLERGIES:  No Known Allergies  DISCHARGE MEDICATIONS:   Current Discharge Medication List    START taking these medications   Details  bisacodyl (DULCOLAX) 10 MG suppository Place 1 suppository (10 mg total) rectally daily as needed for moderate constipation. Qty: 12 suppository, Refills: 0    docusate sodium (COLACE) 100 MG capsule Take 1 capsule (100 mg total) by mouth 2 (two) times daily. Qty: 10 capsule, Refills: 0    enoxaparin (LOVENOX) 40 MG/0.4ML injection Inject 0.4 mLs (40 mg total) into the skin daily. Qty: 9 Syringe, Refills: 0    ferrous sulfate 325 (65 FE) MG tablet Take 1 tablet (325 mg total) by mouth 3 (three) times daily after meals. Qty: 90 tablet, Refills: 3    levofloxacin (LEVAQUIN) 500 MG tablet Take 1 tablet (500 mg total) by mouth daily. Qty: 4 tablet, Refills: 0    polyethylene glycol (MIRALAX / GLYCOLAX) packet Take 17 g by mouth daily as needed for mild constipation. Qty: 14 each, Refills: 0      CONTINUE these medications which have CHANGED   Details  HYDROcodone-acetaminophen (NORCO/VICODIN) 5-325 MG tablet Take 1 tablet by mouth every 4 (four) hours as needed for moderate pain or severe pain. Qty: 30 tablet, Refills: 0      CONTINUE these medications which have NOT CHANGED   Details  aspirin EC 81 MG tablet Take 81 mg by mouth daily.    atorvastatin (LIPITOR) 80 MG tablet Take 1 tablet (80 mg total) by mouth daily at 6 PM. Qty: 90 tablet, Refills: 3    carvedilol (COREG) 6.25 MG tablet Take 1 tablet (6.25 mg total) by mouth 2 (two) times daily with a meal. Qty: 180 tablet, Refills: 3    furosemide (LASIX) 20 MG tablet Take 1 tablet (20 mg total) by mouth every other day. Qty: 30 tablet, Refills: 3    multivitamin-iron-minerals-folic acid (CENTRUM) chewable tablet  Chew 1 tablet by mouth daily.    omega-3 acid ethyl esters (LOVAZA) 1 g capsule Take 1 g by mouth daily.    pantoprazole (PROTONIX) 40 MG tablet Take 1 tablet (40 mg total) by mouth daily. Qty: 90 tablet, Refills: 2    pregabalin (LYRICA) 75 MG capsule Take 1 capsule (75 mg total) by mouth 2 (two) times daily. Qty: 180 capsule, Refills: 0    sertraline (ZOLOFT) 100 MG tablet Take 1 tablet (100 mg total) by mouth daily. Qty: 90 tablet, Refills: 3    nitroGLYCERIN (NITROSTAT) 0.4 MG SL tablet Place 1 tablet (0.4 mg total) under the tongue every 5 (five) minutes as needed for chest pain. Qty: 25 tablet, Refills: 3    thiamine (VITAMIN B-1) 100 MG tablet Take 100 mg by mouth daily.    Vitamin D, Ergocalciferol, (DRISDOL) 50000 units CAPS capsule Take 50,000 Units by mouth every 7 (seven) days.      STOP taking these medications     amLODipine (NORVASC) 2.5 MG tablet          DISCHARGE INSTRUCTIONS:    Follow with ortho clinic.  If you experience worsening of your admission symptoms, develop shortness of breath, life threatening emergency, suicidal or homicidal thoughts you must seek medical attention immediately by calling 911 or calling your MD immediately  if symptoms less severe.  You Must read complete instructions/literature along with all the possible adverse reactions/side effects for all the Medicines you take and that have been prescribed to you. Take any new Medicines after you have completely understood and accept all the possible adverse reactions/side effects.   Please note  You were cared for by a hospitalist during your hospital stay. If you have any questions about your discharge medications or the care you received while you were in the hospital after you are discharged, you can call the unit and asked to speak with the hospitalist on call if the hospitalist that took care of you is not available. Once you are discharged, your primary care physician will handle  any further medical issues. Please note that NO REFILLS for any discharge medications will be authorized once you are discharged, as it is imperative that you return to your primary care physician (or establish a relationship with a primary care physician if you do not have one) for your aftercare needs so that they can reassess your need for medications and monitor your lab values.    Today   CHIEF COMPLAINT:   Chief Complaint  Patient presents with  . Ankle Pain    HISTORY OF PRESENT ILLNESS:  Katherine [name]  is a 83 y.o. female with a known history of Coronary artery disease, congestive heart failure presents to the ER after a fall. She was putting wood into the stove and she felt a little pain in her back and hip and thought she was going to fall so she tried to sit  down. She sat down on her ankle. Her ankle cracked and she had severe pain and came in to the hospital. No complaints of chest pain. No shortness of breath with exertion.  VITAL SIGNS:  Blood pressure (!) 108/42, pulse (!) 52, temperature 98.4 F (36.9 C), temperature source Oral, resp. rate 16, height 5\' 1"  (1.549 m), weight 61.7 kg (136 lb), SpO2 93 %.  I/O:   Intake/Output Summary (Last 24 hours) at 08/26/16 0932 Last data filed at 08/26/16 0800  Gross per 24 hour  Intake              360 ml  Output               65 ml  Net              295 ml    PHYSICAL EXAMINATION:   GENERAL:  83 y.o.-year-old patient lying in the bed with no acute distress.  EYES: Pupils equal, round, reactive to light and accommodation. No scleral icterus. Extraocular muscles intact.  HEENT: Head atraumatic, normocephalic. Oropharynx and nasopharynx clear.  NECK:  Supple, no jugular venous distention. No thyroid enlargement, no tenderness.  LUNGS: Normal breath sounds bilaterally, no wheezing, rales,rhonchi or crepitation. No use of accessory muscles of respiration.  CARDIOVASCULAR: S1, S2 normal. No murmurs, rubs, or gallops.  ABDOMEN: Soft,  nontender, nondistended. Bowel sounds present. No organomegaly or mass.  EXTREMITIES: No pedal edema, cyanosis, or clubbing. Right ankle dressing present. NEUROLOGIC: Cranial nerves II through XII are intact. Muscle strength 5/5 in all extremities. Sensation intact. Gait not checked.  PSYCHIATRIC: The patient is alert and oriented x 3.  SKIN: No obvious rash, lesion, or ulcer.   DATA REVIEW:   CBC  Recent Labs Lab 08/26/16 0407  WBC 11.9*  HGB 10.5*  HCT 31.8*  PLT 118*    Chemistries   Recent Labs Lab 08/23/16 1200  08/26/16 0407  NA 141  < > 136  K 4.2  < > 3.6  CL 105  < > 105  CO2 29  < > 26  GLUCOSE 138*  < > 135*  BUN 21*  < > 17  CREATININE 0.99  < > 0.90  CALCIUM 9.4  < > 8.3*  AST 68*  --   --   ALT 47  --   --   ALKPHOS 79  --   --   BILITOT 0.5  --   --   < > = values in this interval not displayed.  Cardiac Enzymes No results for input(s): TROPONINI in the last 168 hours.  Microbiology Results  Results for orders placed or performed during the hospital encounter of 08/23/16  Surgical pcr screen     Status: None   Collection Time: 08/23/16  7:47 PM  Result Value Ref Range Status   MRSA, PCR NEGATIVE NEGATIVE Final   Staphylococcus aureus NEGATIVE NEGATIVE Final    Comment:        The Xpert SA Assay (FDA approved for NASAL specimens in patients over 74 years of age), is one component of a comprehensive surveillance program.  Test performance has been validated by [facility] for patients greater than or equal to 49 year old. It is not intended to diagnose infection nor to guide or monitor treatment.     RADIOLOGY:  Dg Chest 2 View  Result Date: 08/26/2016 CLINICAL DATA:  83 year old female with fever status post right ankle ORIF. Initial encounter. EXAM: CHEST  2 VIEW COMPARISON:  08/23/2016 and earlier. FINDINGS: Seated AP and lateral views of the chest. Lower lung volumes with streaky left lower lobe opacity. No definite pleural effusion.  Stable cardiomegaly and mediastinal contours. No pneumothorax or pulmonary edema. The right lung appears clear. Negative visible bowel gas pattern. No acute osseous abnormality identified. IMPRESSION: 1. Lower lung volumes with new streaky left lung base opacity, favor atelectasis but developing pneumonia is difficult to exclude. No pleural effusion. 2. Stable cardiomegaly. Electronically Signed   By: Genevie Ann M.D.   On: 08/26/2016 07:54   Dg Ankle Complete Right  Result Date: 08/24/2016 CLINICAL DATA:  83 year old female status post ORIF of the right ankle EXAM: RIGHT ANKLE - COMPLETE 3+ VIEW COMPARISON:  Preoperative radiographs 08/23/2016 FINDINGS: Interval open reduction and internal fixation of a by malleolar fracture dislocation. The lateral malleolar fracture is now transfixed by a lateral buttress plate and screw construct. Two cannulated lag screws transfix the medial malleolar fracture. Alignment has been reduced and is now anatomic. The talar dome remains intact. The ankle mortise is now congruent. Plaster casting material projects over the lower leg and ankle. IMPRESSION: ORIF of bimalleolar fracture subluxation without evidence of hardware complication as described above. Electronically Signed   By: Jacqulynn Cadet M.D.   On: 08/24/2016 11:51    EKG:   Orders placed or performed during the hospital encounter of 08/23/16  . ED EKG  . ED EKG      Management plans discussed with the patient, family and they are in agreement.  CODE STATUS:     Code Status Orders        Start     Ordered   08/23/16 1309  Full code  Continuous     08/23/16 1308    Code Status History    Date Active Date Inactive Code Status Order ID Comments User Context   01/07/2016  8:49 PM 01/09/2016  8:04 PM Full Code [id number]  Gladstone Lighter, MD Inpatient   11/14/2015 10:52 PM 11/17/2015  6:04 PM Full Code [id number]  Nicholes Mango, MD ED   10/30/2015  2:09 AM 11/02/2015  5:18 PM Full Code [id number]  Sueanne Margarita, MD ED      TOTAL TIME TAKING CARE OF THIS PATIENT: 35 minutes.    Vaughan Basta M.D on 08/26/2016 at 9:32 AM  Between 7am to 6pm - Pager - [phone]  After 6pm go to [email or web address] - password [email or web address] [facility]  Office  [phone]  CC: Primary care physician; Coral Spikes, DO   Note: This dictation was prepared with Dragon dictation along with smaller phrase technology. Any transcriptional errors that result from this process are unintentional.

## 2016-08-26 NOTE — Clinical Social Work Placement (Signed)
   CLINICAL SOCIAL WORK PLACEMENT  NOTE  Date:  08/26/2016  Patient Details  Name: Katherine [name] MRN: [id number] Date of Birth: 07-Nov-1929  Clinical Social Work is seeking post-discharge placement for this patient at the [place] level of care (*CSW will initial, date and re-position this form in  chart as items are completed):  Yes   Patient/family provided with [facility]'s list of facilities offering this level of care within the geographic area requested by the patient (or if unable, by the patient's family).  Yes   Patient/family informed of their freedom to choose among providers that offer the needed level of care, that participate in Medicare, Medicaid or managed care program needed by the patient, have an available bed and are willing to accept the patient.  Yes   Patient/family informed of [facility]'s ownership interest in [facility] and [facility], as well as of the fact that they are under no obligation to receive care at these facilities.  PASRR submitted to EDS on 08/24/16     PASRR number received on 08/24/16     Existing PASRR number confirmed on       FL2 transmitted to all facilities in geographic area requested by pt/family on 08/24/16     FL2 transmitted to all facilities within larger geographic area on       Patient informed that his/her managed care company has contracts with or will negotiate with certain facilities, including the following:        Yes   Patient/family informed of bed offers received.  Patient chooses bed at  [facility] )     Physician recommends and patient chooses bed at      Patient to be transferred to  [facility] ) on 08/26/16.  Patient to be transferred to facility by  [facility] )     Patient family notified on 08/26/16 of transfer.  Name of family member notified:   (Patient's niece Katherine [name] is at bedside and aware of D/C today. )     PHYSICIAN    Additional Comment:    _______________________________________________ Xianna Siverling, Veronia Beets, LCSW 08/26/2016, 11:28 AM

## 2016-08-26 NOTE — Care Management Important Message (Signed)
Important Message  Patient Details  Name: Katherine [name] MRN: [id number] Date of Birth: 01-20-1930   Medicare Important Message Given:  Yes    Jolly Mango, RN 08/26/2016, 10:10 AM

## 2016-08-26 NOTE — Progress Notes (Signed)
RN gave report to RN at [facility]. Waited for patient to have a bowel movement before transporting and patient just had large bowel movement. RN called EMS for transport. All questions answered for patient and RN receiving patient.   Deri Fuelling, RN

## 2016-08-26 NOTE — Progress Notes (Signed)
Patient HR in 37's MD ordered to hold beta blockers.   Deri Fuelling, RN

## 2016-08-26 NOTE — Progress Notes (Signed)
Physical Therapy Treatment Patient Details Name: Katherine [name] MRN: [id number] DOB: October 24, 1929 Today's Date: 08/26/2016    History of Present Illness 83yo female s/p R ankle fracture subsequent ORIF. PMH: CAD, essential HTN, GERD, depression, HLD    PT Comments    Pt lethargic and limited with participation today.  She is generally willing to participate when alert/awake, but struggled to keep her eyes open consistently and needed constant cuing to stay awake and on task.  She did make good effort with attempt at standing and even was able to lift R foot off ground while trying to hop, but needed +2 max just to remain upright in standing.  Pt struggled to get to recliner and was able to show only inconsistent participation with exercises.   Follow Up Recommendations  SNF     Equipment Recommendations       Recommendations for Other Services       Precautions / Restrictions Precautions Precautions: Fall Restrictions Weight Bearing Restrictions: Yes RLE Weight Bearing: Non weight bearing    Mobility  Bed Mobility Overal bed mobility: Needs Assistance Bed Mobility: Supine to Sit;Sit to Supine     Supine to sit: Max assist     General bed mobility comments: Pt unable to assist as much as she did yesterday with getting to EOB, did maintain balance (with cuing/positioning/placing hand) with sitting at EOB  Transfers Overall transfer level: Needs assistance Equipment used: Rolling walker (2 wheeled) Transfers: Sit to/from Stand Sit to Stand: Max assist;+2 physical assistance         General transfer comment: Pt able to show some effort with getting to standing, also with heavy assist to get upright she was able to lift/keep R LE off floor  Ambulation/Gait             General Gait Details: Pt unable to do any actual stepping/walking but did appear to try a few small hopping steps with heavy assist to unweight/keep upright   Stairs            Wheelchair  Mobility    Modified Rankin (Stroke Patients Only)       Balance Overall balance assessment: Needs assistance Sitting-balance support: Bilateral upper extremity supported Sitting balance-Leahy Scale: Fair       Standing balance-Leahy Scale: Zero                      Cognition Arousal/Alertness: Lethargic Behavior During Therapy: WFL for tasks assessed/performed                   General Comments: Pt falling asleep during parts of PT session, struggled to fully participate    Exercises General Exercises - Lower Extremity Ankle Circles/Pumps: AROM;10 reps;Left Long Arc Quad: PROM;AROM;10 reps;Both Heel Slides: AAROM;10 reps;Both;AROM Hip ABduction/ADduction: AROM;10 reps;Both Hip Flexion/Marching: AAROM;PROM;10 reps;Both    General Comments        Pertinent Vitals/Pain Pain Assessment:  (not rated, indicates moderate pain with most R LE movement)    Home Living                      Prior Function            PT Goals (current goals can now be found in the care plan section) Progress towards PT goals: PT to reassess next treatment    Frequency    BID      PT Plan Current plan remains appropriate    Co-evaluation  End of Session Equipment Utilized During Treatment: Gait belt Activity Tolerance: Patient limited by fatigue Patient left: with chair alarm set;with call bell/phone within reach     Time: 0925-0950 PT Time Calculation (min) (ACUTE ONLY): 25 min  Charges:  $Therapeutic Exercise: 8-22 mins $Therapeutic Activity: 8-22 mins                    G Codes:      Kreg Shropshire, DPT 08/26/2016, 12:16 PM

## 2016-08-26 NOTE — Progress Notes (Signed)
Pt got confused after receiving pain medication. Otherwise, pt had a good night. No acute distress noted. Staff will continue to monitor

## 2016-08-27 ENCOUNTER — Ambulatory Visit: Payer: Medicare HMO | Admitting: Family Medicine

## 2016-08-27 DIAGNOSIS — M810 Age-related osteoporosis without current pathological fracture: Secondary | ICD-10-CM | POA: Diagnosis not present

## 2016-08-27 DIAGNOSIS — I2510 Atherosclerotic heart disease of native coronary artery without angina pectoris: Secondary | ICD-10-CM | POA: Diagnosis not present

## 2016-08-27 DIAGNOSIS — J189 Pneumonia, unspecified organism: Secondary | ICD-10-CM | POA: Diagnosis not present

## 2016-08-27 DIAGNOSIS — R69 Illness, unspecified: Secondary | ICD-10-CM | POA: Diagnosis not present

## 2016-08-30 ENCOUNTER — Telehealth: Payer: Self-pay | Admitting: Family Medicine

## 2016-08-30 NOTE — Telephone Encounter (Signed)
Patient in SNF will need to schedule once released patient is at [facility]. Fractured ankle in 3 places.

## 2016-08-31 ENCOUNTER — Ambulatory Visit: Payer: Medicare HMO | Admitting: Anesthesiology

## 2016-08-31 ENCOUNTER — Ambulatory Visit: Payer: Medicare HMO | Admitting: Family Medicine

## 2016-08-31 DIAGNOSIS — S82851D Displaced trimalleolar fracture of right lower leg, subsequent encounter for closed fracture with routine healing: Secondary | ICD-10-CM | POA: Diagnosis not present

## 2016-09-08 DIAGNOSIS — S82851D Displaced trimalleolar fracture of right lower leg, subsequent encounter for closed fracture with routine healing: Secondary | ICD-10-CM | POA: Diagnosis not present

## 2016-09-22 DIAGNOSIS — S82851D Displaced trimalleolar fracture of right lower leg, subsequent encounter for closed fracture with routine healing: Secondary | ICD-10-CM | POA: Diagnosis not present

## 2016-09-30 DIAGNOSIS — S82851D Displaced trimalleolar fracture of right lower leg, subsequent encounter for closed fracture with routine healing: Secondary | ICD-10-CM | POA: Diagnosis not present

## 2016-09-30 DIAGNOSIS — E785 Hyperlipidemia, unspecified: Secondary | ICD-10-CM | POA: Diagnosis not present

## 2016-09-30 DIAGNOSIS — R69 Illness, unspecified: Secondary | ICD-10-CM | POA: Diagnosis not present

## 2016-09-30 DIAGNOSIS — M25511 Pain in right shoulder: Secondary | ICD-10-CM | POA: Diagnosis not present

## 2016-09-30 DIAGNOSIS — I110 Hypertensive heart disease with heart failure: Secondary | ICD-10-CM | POA: Diagnosis not present

## 2016-09-30 DIAGNOSIS — I5042 Chronic combined systolic (congestive) and diastolic (congestive) heart failure: Secondary | ICD-10-CM | POA: Diagnosis not present

## 2016-09-30 DIAGNOSIS — J449 Chronic obstructive pulmonary disease, unspecified: Secondary | ICD-10-CM | POA: Diagnosis not present

## 2016-09-30 DIAGNOSIS — I255 Ischemic cardiomyopathy: Secondary | ICD-10-CM | POA: Diagnosis not present

## 2016-09-30 DIAGNOSIS — I2510 Atherosclerotic heart disease of native coronary artery without angina pectoris: Secondary | ICD-10-CM | POA: Diagnosis not present

## 2016-09-30 DIAGNOSIS — K219 Gastro-esophageal reflux disease without esophagitis: Secondary | ICD-10-CM | POA: Diagnosis not present

## 2016-09-30 DIAGNOSIS — M1990 Unspecified osteoarthritis, unspecified site: Secondary | ICD-10-CM | POA: Diagnosis not present

## 2016-10-01 ENCOUNTER — Telehealth: Payer: Self-pay | Admitting: *Deleted

## 2016-10-01 DIAGNOSIS — S8251XD Displaced fracture of medial malleolus of right tibia, subsequent encounter for closed fracture with routine healing: Secondary | ICD-10-CM | POA: Diagnosis not present

## 2016-10-01 NOTE — Telephone Encounter (Signed)
Yes. Verbal okay.

## 2016-10-01 NOTE — Telephone Encounter (Signed)
shennon was called and given verbal. She will fax over orders to be signed.

## 2016-10-01 NOTE — Telephone Encounter (Signed)
Shennon from [facility] requested orders for occupational therapy and wound care for pt's ankle.  Contact Shennon [phone]

## 2016-10-01 NOTE — Telephone Encounter (Signed)
Verbal order okay?

## 2016-10-04 DIAGNOSIS — I2510 Atherosclerotic heart disease of native coronary artery without angina pectoris: Secondary | ICD-10-CM | POA: Diagnosis not present

## 2016-10-04 DIAGNOSIS — R69 Illness, unspecified: Secondary | ICD-10-CM | POA: Diagnosis not present

## 2016-10-04 DIAGNOSIS — M1990 Unspecified osteoarthritis, unspecified site: Secondary | ICD-10-CM | POA: Diagnosis not present

## 2016-10-04 DIAGNOSIS — I110 Hypertensive heart disease with heart failure: Secondary | ICD-10-CM | POA: Diagnosis not present

## 2016-10-04 DIAGNOSIS — J449 Chronic obstructive pulmonary disease, unspecified: Secondary | ICD-10-CM | POA: Diagnosis not present

## 2016-10-04 DIAGNOSIS — I5042 Chronic combined systolic (congestive) and diastolic (congestive) heart failure: Secondary | ICD-10-CM | POA: Diagnosis not present

## 2016-10-04 DIAGNOSIS — K219 Gastro-esophageal reflux disease without esophagitis: Secondary | ICD-10-CM | POA: Diagnosis not present

## 2016-10-04 DIAGNOSIS — E785 Hyperlipidemia, unspecified: Secondary | ICD-10-CM | POA: Diagnosis not present

## 2016-10-04 DIAGNOSIS — I255 Ischemic cardiomyopathy: Secondary | ICD-10-CM | POA: Diagnosis not present

## 2016-10-04 DIAGNOSIS — S82851D Displaced trimalleolar fracture of right lower leg, subsequent encounter for closed fracture with routine healing: Secondary | ICD-10-CM | POA: Diagnosis not present

## 2016-10-06 ENCOUNTER — Telehealth: Payer: Self-pay | Admitting: Family Medicine

## 2016-10-06 DIAGNOSIS — K219 Gastro-esophageal reflux disease without esophagitis: Secondary | ICD-10-CM | POA: Diagnosis not present

## 2016-10-06 DIAGNOSIS — E785 Hyperlipidemia, unspecified: Secondary | ICD-10-CM | POA: Diagnosis not present

## 2016-10-06 DIAGNOSIS — M1990 Unspecified osteoarthritis, unspecified site: Secondary | ICD-10-CM | POA: Diagnosis not present

## 2016-10-06 DIAGNOSIS — I2510 Atherosclerotic heart disease of native coronary artery without angina pectoris: Secondary | ICD-10-CM | POA: Diagnosis not present

## 2016-10-06 DIAGNOSIS — R69 Illness, unspecified: Secondary | ICD-10-CM | POA: Diagnosis not present

## 2016-10-06 DIAGNOSIS — J449 Chronic obstructive pulmonary disease, unspecified: Secondary | ICD-10-CM | POA: Diagnosis not present

## 2016-10-06 DIAGNOSIS — S82851D Displaced trimalleolar fracture of right lower leg, subsequent encounter for closed fracture with routine healing: Secondary | ICD-10-CM | POA: Diagnosis not present

## 2016-10-06 DIAGNOSIS — I255 Ischemic cardiomyopathy: Secondary | ICD-10-CM | POA: Diagnosis not present

## 2016-10-06 DIAGNOSIS — I5042 Chronic combined systolic (congestive) and diastolic (congestive) heart failure: Secondary | ICD-10-CM | POA: Diagnosis not present

## 2016-10-06 DIAGNOSIS — I110 Hypertensive heart disease with heart failure: Secondary | ICD-10-CM | POA: Diagnosis not present

## 2016-10-06 NOTE — Telephone Encounter (Signed)
Vanessa from [facility] called and is requesting verbal orders for pt. Please advise, thank you!  Call @ [phone]

## 2016-10-07 ENCOUNTER — Encounter: Payer: Medicare HMO | Attending: Surgery | Admitting: Surgery

## 2016-10-07 DIAGNOSIS — T8131XA Disruption of external operation (surgical) wound, not elsewhere classified, initial encounter: Secondary | ICD-10-CM | POA: Insufficient documentation

## 2016-10-07 DIAGNOSIS — I509 Heart failure, unspecified: Secondary | ICD-10-CM | POA: Insufficient documentation

## 2016-10-07 DIAGNOSIS — Z7982 Long term (current) use of aspirin: Secondary | ICD-10-CM | POA: Insufficient documentation

## 2016-10-07 DIAGNOSIS — I252 Old myocardial infarction: Secondary | ICD-10-CM | POA: Diagnosis not present

## 2016-10-07 DIAGNOSIS — I110 Hypertensive heart disease with heart failure: Secondary | ICD-10-CM | POA: Diagnosis not present

## 2016-10-07 DIAGNOSIS — M84471G Pathological fracture, right ankle, subsequent encounter for fracture with delayed healing: Secondary | ICD-10-CM | POA: Diagnosis not present

## 2016-10-07 DIAGNOSIS — M1990 Unspecified osteoarthritis, unspecified site: Secondary | ICD-10-CM | POA: Insufficient documentation

## 2016-10-07 DIAGNOSIS — L97311 Non-pressure chronic ulcer of right ankle limited to breakdown of skin: Secondary | ICD-10-CM | POA: Insufficient documentation

## 2016-10-07 DIAGNOSIS — G629 Polyneuropathy, unspecified: Secondary | ICD-10-CM | POA: Diagnosis not present

## 2016-10-07 DIAGNOSIS — Y839 Surgical procedure, unspecified as the cause of abnormal reaction of the patient, or of later complication, without mention of misadventure at the time of the procedure: Secondary | ICD-10-CM | POA: Diagnosis not present

## 2016-10-07 DIAGNOSIS — I890 Lymphedema, not elsewhere classified: Secondary | ICD-10-CM | POA: Insufficient documentation

## 2016-10-07 NOTE — Telephone Encounter (Signed)
Was unable to speak with Katherine [name] who was out of office. Katherine [name] was given verbal order for OT evaluation.

## 2016-10-07 NOTE — Telephone Encounter (Signed)
Okay for verbal 

## 2016-10-08 DIAGNOSIS — M1990 Unspecified osteoarthritis, unspecified site: Secondary | ICD-10-CM | POA: Diagnosis not present

## 2016-10-08 DIAGNOSIS — I5042 Chronic combined systolic (congestive) and diastolic (congestive) heart failure: Secondary | ICD-10-CM | POA: Diagnosis not present

## 2016-10-08 DIAGNOSIS — I2510 Atherosclerotic heart disease of native coronary artery without angina pectoris: Secondary | ICD-10-CM | POA: Diagnosis not present

## 2016-10-08 DIAGNOSIS — J449 Chronic obstructive pulmonary disease, unspecified: Secondary | ICD-10-CM | POA: Diagnosis not present

## 2016-10-08 DIAGNOSIS — I255 Ischemic cardiomyopathy: Secondary | ICD-10-CM | POA: Diagnosis not present

## 2016-10-08 DIAGNOSIS — S82851D Displaced trimalleolar fracture of right lower leg, subsequent encounter for closed fracture with routine healing: Secondary | ICD-10-CM | POA: Diagnosis not present

## 2016-10-08 DIAGNOSIS — K219 Gastro-esophageal reflux disease without esophagitis: Secondary | ICD-10-CM | POA: Diagnosis not present

## 2016-10-08 DIAGNOSIS — E785 Hyperlipidemia, unspecified: Secondary | ICD-10-CM | POA: Diagnosis not present

## 2016-10-08 DIAGNOSIS — I110 Hypertensive heart disease with heart failure: Secondary | ICD-10-CM | POA: Diagnosis not present

## 2016-10-08 DIAGNOSIS — R69 Illness, unspecified: Secondary | ICD-10-CM | POA: Diagnosis not present

## 2016-10-09 NOTE — Progress Notes (Signed)
Katherine [name] ([id number]) Visit Report for 10/07/2016 Chief Complaint Document Details Patient Name: Katherine [name], Katherine I. Date of Service: 10/07/2016 9:45 AM Medical Record Number: [id number] Patient Account Number: [id number] Date of Birth/Sex: [birth year] (84 y.o. Female) Treating RN: Carolyne Fiscal, Debi Primary Care Provider: Thersa Salt Other Clinician: Referring Provider: Carlynn Spry Treating Provider/Extender: Frann Rider in Treatment: 0 Information Obtained from: Patient Chief Complaint Patient presents to the wound care center with open non-healing surgical wound(s) of the right ankle after Katherine trimalleolar fracture was repaired ion 9th January 2018 Electronic Signature(s) Signed: 10/07/2016 11:20:40 AM By: Christin Fudge MD, FACS Entered By: Christin Fudge on 10/07/2016 11:20:40 Katherine [name] I. ([id number]) -------------------------------------------------------------------------------- Debridement Details Patient Name: Katherine [name] I. Date of Service: 10/07/2016 9:45 AM Medical Record Number: [id number] Patient Account Number: [id number] Date of Birth/Sex: 26-Jul-1930 (83 y.o. Female) Treating RN: Carolyne Fiscal, Debi Primary Care Provider: Thersa Salt Other Clinician: Referring Provider: Carlynn Spry Treating Provider/Extender: Frann Rider in Treatment: 0 Debridement Performed for Wound #1 Right,Lateral Lower Leg Assessment: Performed By: Physician Christin Fudge, MD Debridement: Debridement Pre-procedure Yes - 10:55 Verification/Time Out Taken: Start Time: 10:56 Pain Control: Lidocaine 4% Topical Solution Level: Skin/Subcutaneous Tissue Total Area Debrided (L x 4.8 (cm) x 0.5 (cm) = 2.4 (cm) W): Tissue and other Viable, Non-Viable, Exudate, Fibrin/Slough, Subcutaneous material debrided: Instrument: Curette Bleeding: Minimum Hemostasis Achieved: Pressure End Time: 10:57 Procedural Pain: 4 Post Procedural Pain: 3 Response to Treatment: Procedure was tolerated  well Post Debridement Measurements of Total Wound Length: (cm) 4.8 Width: (cm) 0.5 Depth: (cm) 0.4 Volume: (cm) 0.754 Character of Wound/Ulcer Post Requires Further Debridement Debridement: Severity of Tissue Post Debridement: Fat layer exposed Post Procedure Diagnosis Same as Pre-procedure Electronic Signature(s) Signed: 10/07/2016 11:20:01 AM By: Christin Fudge MD, FACS Signed: 10/08/2016 4:53:33 PM By: Alric Quan Entered By: Christin Fudge on 10/07/2016 11:20:00 Katherine [name] I. ([id number]) Katherine [name], Katherine [name] I. ([id number]) -------------------------------------------------------------------------------- HPI Details Patient Name: Katherine [name] I. Date of Service: 10/07/2016 9:45 AM Medical Record Number: [id number] Patient Account Number: [id number] Date of Birth/Sex: 1930-07-24 (83 y.o. Female) Treating RN: Carolyne Fiscal, Debi Primary Care Provider: Thersa Salt Other Clinician: Referring Provider: Carlynn Spry Treating Provider/Extender: Frann Rider in Treatment: 0 History of Present Illness Location: right lateral ankle Quality: Patient reports experiencing Katherine dull pain to affected area(s). Severity: Patient states wound (s) are getting better. Duration: Patient has had the wound for < 6 weeks prior to presenting for treatment Timing: Pain in wound is constant (hurts all the time) Context: The wound appeared gradually over time Modifying Factors: Other treatment(s) tried include:nonweightbearing boot Associated Signs and Symptoms: Patient reports having increase swelling. HPI Description: 83 year old patient who has recently been seen several times at the orthopedic office for problems with her wound healing after she had Katherine right ankle trimalleolar fracture repaired on 08/24/2016. He simply did notice some drainage from the incision and she was reviewed in the orthopedic office on 10/01/2016 and started on Keflex and Septra. She was sent to [place] for an opinion is also than be  following up with the orthopedic physician. Most recent x-rays taken of the orthopedic office were within normal limits. Ration does not have any significant history of diabetes mellitus or smoking but has had edema problems in the legs and does say that she's seen Katherine vein surgeon in the past. All this is mainly done in [place], [place] where she normally resides. Electronic Signature(s) Signed: 10/07/2016 11:25:03 AM By: Christin Fudge MD, FACS Entered By: Christin Fudge on 10/07/2016  11:25:03 Katherine [name], Katherine [name] ([id number]) -------------------------------------------------------------------------------- Physical Exam Details Patient Name: Katherine [name], Katherine I. Date of Service: 10/07/2016 9:45 AM Medical Record Number: [id number] Patient Account Number: [id number] Date of Birth/Sex: 14-Jan-1930 (83 y.o. Female) Treating RN: Carolyne Fiscal, Debi Primary Care Provider: Thersa Salt Other Clinician: Referring Provider: Carlynn Spry Treating Provider/Extender: Frann Rider in Treatment: 0 Constitutional . Pulse regular. Respirations normal and unlabored. Afebrile. . Eyes Nonicteric. Reactive to light. Ears, Nose, Mouth, and Throat Lips, teeth, and gums WNL.Marland Kitchen Moist mucosa without lesions. Neck supple and nontender. No palpable supraclavicular or cervical adenopathy. Normal sized without goiter. Respiratory WNL. No retractions.. Cardiovascular Pedal Pulses WNL. ABI on the left was 1.19 on the right was 1.22. stage I lymphedema both lower extremities right more than the left. Chest Breasts symmetical and no nipple discharge.. Breast tissue WNL, no masses, lumps, or tenderness.. Gastrointestinal (GI) Abdomen without masses or tenderness.. No liver or spleen enlargement or tenderness.. Lymphatic No adneopathy. No adenopathy. No adenopathy. Musculoskeletal Adexa without tenderness or enlargement.. Digits and nails w/o clubbing, cyanosis, infection, petechiae, ischemia, or inflammatory  conditions.. Integumentary (Hair, Skin) No suspicious lesions. No crepitus or fluctuance. No peri-wound warmth or erythema. No masses.Marland Kitchen Psychiatric Judgement and insight Intact.. No evidence of depression, anxiety, or agitation.. Notes he has Katherine linear scar with minimal disease since and some slough at the lower end which was sharply debrided with Katherine #3 curet and bleeding controlled with pressure Electronic Signature(s) Signed: 10/07/2016 11:25:47 AM By: Christin Fudge MD, FACS Entered By: Christin Fudge on 10/07/2016 11:25:47 Katherine [name] I. ([id number]) [name] ([id number]) -------------------------------------------------------------------------------- Physician Orders Details Patient Name: Katherine [name] I. Date of Service: 10/07/2016 9:45 AM Medical Record Number: [id number] Patient Account Number: [id number] Date of Birth/Sex: June 25, 1930 (83 y.o. Female) Treating RN: Carolyne Fiscal, Debi Primary Care Provider: Thersa Salt Other Clinician: Referring Provider: Carlynn Spry Treating Provider/Extender: Frann Rider in Treatment: 0 Verbal / Phone Orders: Yes Clinician: Pinkerton, Debi Read Back and Verified: Yes Diagnosis Coding Wound Cleansing Wound #1 Right,Lateral Lower Leg o Clean wound with wound cleanser. Anesthetic Wound #1 Right,Lateral Lower Leg o Topical Lidocaine 4% cream applied to wound bed prior to debridement - for clinic use Primary Wound Dressing Wound #1 Right,Lateral Lower Leg o Aquacel Ag Secondary Dressing o Gauze, ABD and Kerlix/Conform o Drawtex Dressing Change Frequency Wound #1 Right,Lateral Lower Leg o Change dressing every other day. Follow-up Appointments Wound #1 Right,Lateral Lower Leg o Return Appointment in 1 week. Edema Control Wound #1 Right,Lateral Lower Leg o Elevate legs to the level of the heart and pump ankles as often as possible Additional Orders / Instructions Wound #1 Right,Lateral Lower Leg o Increase  protein intake. Home Health Wound #1 [facility] Visits - CABELLA, ANGELOFF I. ([id number]) o Home Health Nurse may visit PRN to address patientos wound care needs. o FACE TO FACE ENCOUNTER: MEDICARE and MEDICAID PATIENTS: I certify that this patient is under my care and that I had Katherine face-to-face encounter that meets the physician face-to-face encounter requirements with this patient on this date. The encounter with the patient was in whole or in part for the following MEDICAL CONDITION: (primary reason for [place]) MEDICAL NECESSITY: I certify, that based on my findings, NURSING services are Katherine medically necessary home health service. HOME BOUND STATUS: I certify that my clinical findings support that this patient is homebound (Maddene., Due to illness or injury, pt requires aid of supportive devices such as crutches, cane, wheelchairs, walkers, the  use of special transportation or the assistance of another person to leave their place of residence. There is Katherine normal inability to leave the home and doing so requires considerable and taxing effort. Other absences are for medical reasons / religious services and are infrequent or of short duration when for other reasons). o If current dressing causes regression in wound condition, may D/C ordered dressing product/s and apply Normal Saline Moist Dressing daily until next [place] / Other MD appointment. [place] of regression in wound condition at [phone]. o Please direct any NON-WOUND related issues/requests for orders to patient's Primary Care Physician Electronic Signature(s) Signed: 10/07/2016 4:29:50 PM By: Christin Fudge MD, FACS Signed: 10/08/2016 4:53:33 PM By: Alric Quan Entered By: Alric Quan on 10/07/2016 13:04:56 Katherine [name] Kitchen ([id number]) -------------------------------------------------------------------------------- Problem List  Details Patient Name: Katherine [name] I. Date of Service: 10/07/2016 9:45 AM Medical Record Number: [id number] Patient Account Number: [id number] Date of Birth/Sex: October 17, 1929 (83 y.o. 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 ([id number]) -------------------------------------------------------------------------------- Progress Note Details Patient Name: Katherine [name] I. Date of Service: 10/07/2016 9:45 AM Medical Record Number: [id number] Patient Account Number: [id number] Date of Birth/Sex: 1930/08/02 (83 y.o. Female) Treating RN: Ahmed Prima Primary Care Provider: Thersa Salt Other Clinician: Referring Provider: Carlynn Spry Treating Provider/Extender: Frann Rider in Treatment: 0 Subjective Chief Complaint Information obtained from Patient Patient presents to the wound care center with open non-healing surgical wound(s) of the right ankle after Katherine trimalleolar fracture was repaired ion 9th January 2018 History of Present Illness (HPI) The following HPI elements were documented for the patient's wound: Location: right lateral ankle Quality: Patient reports experiencing Katherine dull pain to affected  area(s). Severity: Patient states wound (s) are getting better. Duration: Patient has had the wound for < 6 weeks prior to presenting for treatment Timing: Pain in wound is constant (hurts all the time) Context: The wound appeared gradually over time Modifying Factors: Other treatment(s) tried include:nonweightbearing boot Associated Signs and Symptoms: Patient reports having increase swelling. 83 year old patient who has recently been seen several times at the orthopedic office for problems with her wound healing after she had Katherine right ankle trimalleolar fracture repaired on 08/24/2016. He simply did notice some drainage from the incision and she was reviewed in the orthopedic office on 10/01/2016 and started on Keflex and Septra. She was sent to [place] for an opinion is also than be following up with the orthopedic physician. Most recent x-rays taken of the orthopedic office were within normal limits. Ration does not have any significant history of diabetes mellitus or smoking but has had edema problems in the legs and does say that she's seen Katherine vein surgeon in the past. All this is mainly done in [place], [place] where she normally resides. Wound History Patient presents with 1 open wound that has been present for approximately 08/24/15. Patient has been treating wound in the following manner:  gauze. The wound has been healed in the past but has re-opened. Laboratory tests have not been performed in the last month. Patient reportedly has not tested positive for an antibiotic resistant organism. Patient reportedly has tested positive for osteomyelitis. Patient reportedly has had testing performed to evaluate circulation in the legs. Patient experiences the following problems associated with their wounds: swelling. Patient History Information obtained from Patient. Allergies Katherine [name], Katherine [name] ([id number]) NKDA Family History Cancer - Mother, Siblings, Diabetes - Child, Child, Heart Disease - Mother,  Hypertension - Siblings, Stroke - Siblings. Social History Never smoker, Marital Status - Widowed, Alcohol Use - Never, Drug Use - No History, Caffeine Use - Daily. Medical History Cardiovascular Patient has history of Congestive Heart Failure, Hypertension, Myocardial Infarction Musculoskeletal Patient has history of Osteoarthritis Neurologic Patient has history of Neuropathy Review of Systems (ROS) Constitutional Symptoms (General Health) The patient has no complaints or symptoms. Eyes Complains or has symptoms of Glasses / Contacts. Ear/Nose/Mouth/Throat The patient has no complaints or symptoms. Hematologic/Lymphatic The patient has no complaints or symptoms. Respiratory The patient has no complaints or symptoms. Gastrointestinal The patient has no complaints or symptoms. Endocrine The patient has no complaints or symptoms. Genitourinary The patient has no complaints or symptoms. Immunological The patient has no complaints or symptoms. Integumentary (Skin) The patient has no complaints or symptoms. Musculoskeletal broken right leg hx Oncologic The patient has no complaints or symptoms. Psychiatric Complains or has symptoms of Anxiety, depression Medications: reviewing her medication list she is on aspirin, carvediol, Centrum, ferrous sulfate, Keflex, Septra, vitamin D. Katherine [name], Katherine I. ([id number]) Objective Constitutional Pulse regular. Respirations normal and unlabored. Afebrile. Vitals Time Taken: 9:56 AM, Height: 61 in, Weight: 140 lbs, Source: Measured, BMI: 26.4, Temperature: 97.9 F, Pulse: 53 bpm, Respiratory Rate: 18 breaths/min, Blood Pressure: 135/50 mmHg. Eyes Nonicteric. Reactive to light. Ears, Nose, Mouth, and Throat Lips, teeth, and gums WNL.Marland Kitchen Moist mucosa without lesions. Neck supple and nontender. No palpable supraclavicular or cervical adenopathy. Normal sized without goiter. Respiratory WNL. No retractions.. Cardiovascular Pedal Pulses  WNL. ABI on the left was 1.19 on the right was 1.22. stage I lymphedema both lower extremities right more than the left. Chest Breasts symmetical and no nipple discharge.. Breast tissue WNL, no masses, lumps, or tenderness.. Gastrointestinal (GI) Abdomen without masses or tenderness.. No liver or spleen enlargement or tenderness.. Lymphatic No adneopathy. No adenopathy. No adenopathy. Musculoskeletal Adexa without tenderness or enlargement.. Digits and nails w/o clubbing, cyanosis, infection, petechiae, ischemia, or inflammatory conditions.Marland Kitchen Psychiatric Judgement and insight Intact.. No evidence of depression, anxiety, or agitation.Marland Kitchen [place], No Name I. ([id number]) General Notes: he has Katherine linear scar with minimal disease since and some slough at the lower end which was sharply debrided with Katherine #3 curet and bleeding controlled with pressure Integumentary (Hair, Skin) No suspicious lesions. No crepitus or fluctuance. No peri-wound warmth or erythema. No masses.. Wound #1 status is Open. Original cause of wound was Surgical Injury. The wound is located on the Right,Lateral Lower Leg. The wound measures 4.8cm length x 0.5cm width x 0.4cm depth; 1.885cm^2 area and 0.754cm^3 volume. There is Fat Layer (Subcutaneous Tissue) Exposed exposed. There is no tunneling or undermining noted. There is Katherine large amount of serous drainage noted. The wound margin is distinct with the outline attached to the wound base. There is no granulation within the wound bed. There is Katherine large (67- 100%) amount of necrotic tissue within the wound bed including Adherent Slough. 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 She has already been put on 2 antibiotics by the primary treating team and after review I have recommended: 1. Silver alginate to be covered with broad text and some gauze padding and lightly wrapped with Katherine Kerlix and Coban. 2. Elevation has been recommended 3. Phisical Therapy as per the family orthopedic physician's orders 4. review next weeks after the orthopedic opinion Procedures Wound #1 Wound #1 is an Open Surgical Wound located on the Right,Lateral Lower Leg . There was Katherine Skin/Subcutaneous Tissue Debridement [id number]) debridement with total area of 2.4 sq cm performed by Christin Fudge, MD. with the following instrument(s): Curette to remove Viable and Non-Viable tissue/material including Exudate, Fibrin/Slough, and Subcutaneous after achieving pain control using Lidocaine 4% Topical Solution. Katherine time out was conducted at 10:55, prior to the start of the procedure. Katherine [name], Katherine I. ([id number]) Minimum amount of bleeding was controlled with Pressure. The procedure was tolerated well with Katherine pain level of 4 throughout and Katherine pain level of 3 following the procedure. Post Debridement Measurements: 4.8cm length x 0.5cm width x 0.4cm depth; 0.754cm^3 volume. Character of Wound/Ulcer Post Debridement requires further debridement. Severity of Tissue Post Debridement is: Fat layer exposed. Post procedure Diagnosis Wound #1: Same as Pre-Procedure Plan Wound Cleansing: Wound #1 Right,Lateral Lower Leg: Clean wound with wound cleanser. Anesthetic: Wound #1 Right,Lateral Lower Leg: Topical Lidocaine 4% cream applied to wound bed prior to debridement - for clinic use Primary Wound Dressing: Wound #1 Right,Lateral Lower Leg: Aquacel Ag Secondary Dressing: Gauze, ABD and Kerlix/Conform Drawtex Dressing Change  Frequency: Wound #1 Right,Lateral Lower Leg: Change dressing every other day. Follow-up Appointments: Wound #1 Right,Lateral Lower Leg: Return Appointment in 1 week. Edema Control: Wound #1 Right,Lateral Lower Leg: Elevate legs to the level of the heart and pump ankles as often as possible Additional Orders / Instructions: Wound #1 Right,Lateral Lower Leg: Increase protein intake. Home Health: Wound #1 Right,Lateral Lower Leg: Continue Home Health Visits - [place] Nurse may visit PRN to address patient s wound care needs. FACE TO FACE ENCOUNTER: MEDICARE and MEDICAID PATIENTS: I certify that this patient is under my care and that I had Katherine face-to-face encounter that meets the physician face-to-face encounter requirements with this patient on this date. The encounter with the patient was in whole or in part for the following MEDICAL CONDITION: (primary reason for [place]) MEDICAL NECESSITY: I certify, that based on my findings, NURSING services are Katherine medically necessary home health service. HOME BOUND STATUS: I certify that my clinical findings support that this patient is homebound (Maddene., Due to illness or injury, pt requires aid of supportive devices such as crutches, cane, wheelchairs, walkers, the use of special transportation or the assistance of another person to leave their place of residence. There is Katherine Baring, Katherine I. ([id number]) normal inability to leave the home and doing so requires considerable and taxing effort. Other absences are for medical reasons / religious services and are infrequent or of short duration when for other reasons). If current dressing causes regression in wound condition, may D/C ordered dressing product/s and apply Normal Saline Moist Dressing daily  until next [place] / Other MD appointment. [place] of regression in wound condition at [phone]. Please direct any NON-WOUND related issues/requests  for orders to patient's Primary Care Physician this 83 year old patient has mild wound dehiscence and drainage which does not seem purulent and possibly due to her lymphedema of the lower extremity. She has already been put on 2 antibiotics by the primary treating team and after review I have recommended: 1. Silver alginate to be covered with broad text and some gauze padding and lightly wrapped with Katherine Kerlix and Coban. 2. Elevation has been recommended 3. Phisical Therapy as per the family orthopedic physician's orders 4. review next weeks after the orthopedic opinion Electronic Signature(s) Signed: 10/07/2016 4:31:45 PM By: Christin Fudge MD, FACS Previous Signature: 10/07/2016 11:28:37 AM Version By: Christin Fudge MD, FACS Entered By: Christin Fudge on 10/07/2016 16:31:45 Katherine [name] Kitchen ([id number]) -------------------------------------------------------------------------------- ROS/PFSH Details Patient Name: Katherine [name] I. Date of Service: 10/07/2016 9:45 AM Medical Record Number: [id number] Patient Account Number: [id number] Date of Birth/Sex: [birth year] (83 y.o. Female) Treating RN: Carolyne Fiscal, Debi Primary Care Provider: Thersa Salt Other Clinician: Referring Provider: Carlynn Spry Treating Provider/Extender: Frann Rider in Treatment: 0 Information Obtained From Patient Wound History Do you currently have one or more open woundso Yes How many open wounds do you currently haveo 1 Approximately how long have you had your woundso 08/24/15 How have you been treating your wound(s) until nowo gauze Has your wound(s) ever healed and then re-openedo Yes Have you had any lab work done in the past montho No Have you tested positive for an antibiotic resistant organism (MRSA, VRE)o No Have you tested positive for osteomyelitis (bone infection)o Yes Have you had any tests for circulation on your legso Yes Who ordered the testo in [place] Where was the test doneo in [place] Have you had  other problems associated with your woundso Swelling Eyes Complaints and Symptoms: Positive for: Glasses / Contacts Psychiatric Complaints and Symptoms: Positive for: Anxiety Review of System Notes: depression Constitutional Symptoms (General Health) Complaints and Symptoms: No Complaints or Symptoms Ear/Nose/Mouth/Throat Complaints and Symptoms: No Complaints or Symptoms Hematologic/Lymphatic Katherine [name], Katherine I. ([id number]) Complaints and Symptoms: No Complaints or Symptoms Respiratory Complaints and Symptoms: No Complaints or Symptoms Cardiovascular Medical History: Positive for: Congestive Heart Failure; Hypertension; Myocardial Infarction Gastrointestinal Complaints and Symptoms: No Complaints or Symptoms Endocrine Complaints and Symptoms: No Complaints or Symptoms Genitourinary Complaints and Symptoms: No Complaints or Symptoms Immunological Complaints and Symptoms: No Complaints or Symptoms Integumentary (Skin) Complaints and Symptoms: No Complaints or Symptoms Musculoskeletal Complaints and Symptoms: Review of System Notes: broken right leg hx Medical History: Positive for: Osteoarthritis Neurologic Medical History: Positive for: Neuropathy Katherine [name], Katherine I. ([id number]) Oncologic Complaints and Symptoms: No Complaints or Symptoms Immunizations Pneumococcal Vaccine: Received Pneumococcal Vaccination: Yes Family and Social History Cancer: Yes - Mother, Siblings; Diabetes: Yes - Child, Child; Heart Disease: Yes - Mother; Hypertension: Yes - Siblings; Stroke: Yes - Siblings; Never smoker; Marital Status - Widowed; Alcohol Use: Never; Drug Use: No History; Caffeine Use: Daily; Financial Concerns: No; Food, Clothing or Shelter Needs: No; Support System Lacking: No; Transportation Concerns: No; Advanced Directives: No; Patient does not want information on Advanced Directives; Do not resuscitate: No; Living Will: No; Medical Power of Attorney: Yes - buddy Pownall  (Not Provided) Physician Affirmation I have reviewed and agree with the above information. Electronic Signature(s) Signed: 10/07/2016 4:29:50 PM By: Christin Fudge MD, FACS Signed: 10/08/2016 4:53:33 PM By: Alric Quan Entered By:  Christin Fudge on 10/07/2016 11:17:34 GINNIFER, ONCALE I. ([id number]) -------------------------------------------------------------------------------- SuperBill Details Patient Name: BRISIA, BILLING I. Date of Service: 10/07/2016 Medical Record Number: [id number] Patient Account Number: [id number] Date of Birth/Sex: 06-Jun-1930 (83 y.o. 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 I89.0 Lymphedema, not elsewhere classified M84.471G Pathological fracture, right ankle, subsequent encounter for fracture with delayed healing Facility Procedures CPT4: Description Modifier Quantity Code [id number] - WOUND CARE VISIT-LEV 3 EST PT 1 CPT4: [id number] - DEB SUBQ TISSUE 20 SQ CM/< 1 ICD-10 Description Diagnosis T81.31XA Disruption of external operation (surgical) wound, not elsewhere classified, initial encounter L97.311 Non-pressure chronic ulcer of right ankle limited to  breakdown of skin I89.0 Lymphedema, not elsewhere classified M84.471G Pathological fracture, right ankle, subsequent encounter for fracture with delayed healing Physician Procedures CPT4: Description Modifier Quantity Code [id number] - WC PHYS LEVEL 4 - NEW PT 25 1 ICD-10 Description Diagnosis T81.31XA Disruption of external operation (surgical) wound, not elsewhere classified, initial encounter [id number]  Non-pressure chronic ulcer  of right ankle limited to breakdown of skin I89.0 Lymphedema, not elsewhere classified M84.471G Pathological fracture, right ankle, subsequent encounter for fracture with delayed healing MANAHIL, CAMP [name] Kitchen ([id number]) Electronic Signature(s) Signed: 10/07/2016 4:29:50 PM By: Christin Fudge MD, FACS Signed: 10/08/2016 4:53:33 PM By: Alric Quan Previous Signature: 10/07/2016 11:29:02 AM Version By: Christin Fudge MD, FACS Entered By: Alric Quan on 10/07/2016 13:06:41

## 2016-10-09 NOTE — Progress Notes (Signed)
FANIE, GSELL ([id number]) Visit Report for 10/07/2016 Allergy List Details Patient Name: Katherine [name], Katherine [name] I. Date of Service: 10/07/2016 9:45 AM Medical Record Number: [id number] Patient Account Number: [id number] Date of Birth/Sex: Jun 25, 1930 (83 y.o. Female) Treating RN: Carolyne Fiscal, Debi Primary Care Sapphira Harjo: Thersa Salt Other Clinician: Referring Nyonna Hargrove: Carlynn Spry Treating Kazuma Elena/Extender: Frann Rider in Treatment: 0 Allergies Active Allergies NKDA Allergy Notes Electronic Signature(s) Signed: 10/08/2016 4:53:33 PM By: Alric Quan Entered By: Alric Quan on 10/07/2016 09:59:37 [name] ([id number]) -------------------------------------------------------------------------------- Arrival Information Details Patient Name: Katherine Juba I. Date of Service: 10/07/2016 9:45 AM Medical Record Number: [id number] Patient Account Number: [id number] Date of Birth/Sex: 05-15-1930 (83 y.o. Female) Treating RN: Carolyne Fiscal, Debi Primary Care Zalyn Amend: Thersa Salt Other Clinician: Referring Harvie Morua: Carlynn Spry Treating Aria Pickrell/Extender: Frann Rider in Treatment: 0 Visit Information Patient Arrived: Wheel Chair Arrival Time: 09:54 Accompanied By: neice Transfer Assistance: EasyPivot Patient Lift Patient Identification Verified: Yes Secondary Verification Process Yes Completed: Patient Requires Transmission- No Based Precautions: Patient Has Alerts: No Electronic Signature(s) Signed: 10/08/2016 4:53:33 PM By: Alric Quan Entered By: Alric Quan on 10/07/2016 09:56:06 [name] ([id number]) -------------------------------------------------------------------------------- Clinic Level of Care Assessment Details Patient Name: Katherine Juba I. Date of Service: 10/07/2016 9:45 AM Medical Record Number: [id number] Patient Account Number: [id number] Date of Birth/Sex: [birth year] (83 y.o. Female) Treating RN: Carolyne Fiscal, Debi Primary Care Elvin Banker:  Thersa Salt Other Clinician: Referring Mikal Wisman: Carlynn Spry Treating Layton Naves/Extender: Frann Rider in Treatment: 0 Clinic Level of Care Assessment Items TOOL 1 Quantity Score X - Use when EandM and Procedure is performed on INITIAL visit 1 0 ASSESSMENTS - Nursing Assessment / Reassessment X - General Physical Exam (combine w/ comprehensive assessment (listed just 1 20 below) when performed on new pt. evals) X - Comprehensive Assessment (HX, ROS, Risk Assessments, Wounds Hx, etc.) 1 25 ASSESSMENTS - Wound and Skin Assessment / Reassessment []  - Dermatologic / Skin Assessment (not related to wound area) 0 ASSESSMENTS - Ostomy and/or Continence Assessment and Care []  - Incontinence Assessment and Management 0 []  - Ostomy Care Assessment and Management (repouching, etc.) 0 PROCESS - Coordination of Care []  - Simple Patient / Family Education for ongoing care 0 X - Complex (extensive) Patient / Family Education for ongoing care 1 20 X - Staff obtains Programmer, systems, Records, Test Results / Process Orders 1 10 X - Staff telephones HHA, Nursing Homes / Clarify orders / etc 1 10 []  - Routine Transfer to another Facility (non-emergent condition) 0 []  - Routine Hospital Admission (non-emergent condition) 0 X - New Admissions / Biomedical engineer / Ordering NPWT, Apligraf, etc. 1 15 []  - Emergency Hospital Admission (emergent condition) 0 PROCESS - Special Needs []  - Pediatric / Minor Patient Management 0 []  - Isolation Patient Management 0 Katherine [name], Katherine I. ([id number]) []  - Hearing / Language / Visual special needs 0 []  - Assessment of Community assistance (transportation, D/C planning, etc.) 0 []  - Additional assistance / Altered mentation 0 []  - Support Surface(s) Assessment (bed, cushion, seat, etc.) 0 INTERVENTIONS - Miscellaneous []  - External ear exam 0 []  - Patient Transfer (multiple staff / Civil Service fast streamer / Similar devices) 0 []  - Simple Staple / Suture removal (25 or less)  0 []  - Complex Staple / Suture removal (26 or more) 0 []  - Hypo/Hyperglycemic Management (do not check if billed separately) 0 X - Ankle / Brachial Index (ABI) - do not check if billed separately 1 15 Has the patient been seen at the hospital within the last  three years: Yes Total Score: 115 Level Of Care: New/Established - Level 3 Electronic Signature(s) Signed: 10/08/2016 4:53:33 PM By: Alric Quan Entered By: Alric Quan on 10/07/2016 13:06:33 [name] ([id number]) -------------------------------------------------------------------------------- Encounter Discharge Information Details Patient Name: Katherine Juba I. Date of Service: 10/07/2016 9:45 AM Medical Record Number: [id number] Patient Account Number: [id number] Date of Birth/Sex: 03-12-1930 (82 y.o. Female) Treating RN: Carolyne Fiscal, Debi Primary Care Angela Vazguez: Thersa Salt Other Clinician: Referring Yusuf Yu: Carlynn Spry Treating Oda Lansdowne/Extender: Frann Rider in Treatment: 0 Encounter Discharge Information Items Discharge Pain Level: 4 Discharge Condition: Stable Ambulatory Status: Wheelchair Discharge Destination: Home Transportation: Private Auto Accompanied By: neice Schedule Follow-up Appointment: Yes Medication Reconciliation completed and provided to Patient/Care No Feliciano Wynter: Provided on Clinical Summary of Care: 10/07/2016 Form Type Recipient Paper Patient DK Electronic Signature(s) Signed: 10/07/2016 11:09:07 AM By: Ruthine Dose Entered By: Ruthine Dose on 10/07/2016 11:09:07 Katherine [name] ([id number]) -------------------------------------------------------------------------------- Lower Extremity Assessment Details Patient Name: Katherine Juba I. Date of Service: 10/07/2016 9:45 AM Medical Record Number: [id number] Patient Account Number: [id number] Date of Birth/Sex: 03-Nov-1929 (83 y.o. Female) Treating RN: Carolyne Fiscal, Debi Primary Care Alline Pio: Thersa Salt Other Clinician: Referring  Lamount Bankson: Carlynn Spry Treating Joanmarie Tsang/Extender: Frann Rider in Treatment: 0 Vascular Assessment Pulses: Dorsalis Pedis Palpable: [Left:Yes] [Right:Yes] Posterior Tibial Extremity colors, hair growth, and conditions: Extremity Color: [Left:Mottled] [Right:Mottled] Hair Growth on Extremity: [Left:Yes] [Right:Yes] Temperature of Extremity: [Left:Warm] [Right:Warm] Capillary Refill: [Left:< 3 seconds] [Right:< 3 seconds] Blood Pressure: Brachial: [Left:118] [Right:118] Dorsalis Pedis: 130 [Left:Dorsalis Pedis: [id number] Ankle: Posterior Tibial: 140 [Left:Posterior Tibial: 138 1.19] [Right:1.22] Toe Nail Assessment Left: Right: Thick: No No Discolored: Yes Yes Deformed: Yes Yes Improper Length and Hygiene: No No Electronic Signature(s) Signed: 10/08/2016 4:53:33 PM By: Alric Quan Entered By: Alric Quan on 10/07/2016 10:23:47 Katherine [name] ([id number]) -------------------------------------------------------------------------------- Multi Wound Chart Details Patient Name: Katherine Juba I. Date of Service: 10/07/2016 9:45 AM Medical Record Number: [id number] Patient Account Number: [id number] Date of Birth/Sex: [birth year] (83 y.o. Female) Treating RN: Carolyne Fiscal, Debi Primary Care Maleny Candy: Thersa Salt Other Clinician: Referring Varsha Knock: Carlynn Spry Treating Krisy Dix/Extender: Frann Rider in Treatment: 0 Vital Signs Height(in): 61 Pulse(bpm): 53 Weight(lbs): 140 Blood Pressure 135/50 (mmHg): Body Mass Index(BMI): 26 Temperature(F): 97.9 Respiratory Rate 18 (breaths/min): Photos: [1:No Photos] [N/A:N/A] Wound Location: [1:Right Lower Leg - Lateral] [N/A:N/A] Wounding Event: [1:Surgical Injury] [N/A:N/A] Primary Etiology: [1:Open Surgical Wound] [N/A:N/A] Comorbid History: [1:Congestive Heart Failure, Hypertension, Myocardial Infarction, Osteoarthritis, Neuropathy] [N/A:N/A] Date Acquired: [1:08/24/2016] [N/A:N/A] Weeks of Treatment: [1:0]  [N/A:N/A] Wound Status: [1:Open] [N/A:N/A] Measurements L x W x D 4.8x0.5x0.4 [N/A:N/A] (cm) Area (cm) : [1:1.885] [N/A:N/A] Volume (cm) : [1:0.754] [N/A:N/A] Classification: [1:Partial Thickness] [N/A:N/A] Exudate Amount: [1:Large] [N/A:N/A] Exudate Type: [1:Serous] [N/A:N/A] Exudate Color: [1:amber] [N/A:N/A] Wound Margin: [1:Distinct, outline attached] [N/A:N/A] Granulation Amount: [1:None Present (0%)] [N/A:N/A] Necrotic Amount: [1:Large (67-100%)] [N/A:N/A] Exposed Structures: [1:Fat Layer (Subcutaneous Tissue) Exposed: Yes] [N/A:N/A] Epithelialization: [1:None] [N/A:N/A] Debridement: [1:Debridement [id number] [N/A:N/A] Pre-procedure [1:10:55] [N/A:N/A] Verification/Time Out Taken: Katherine [name], Katherine Oddi I. ([id number]) Pain Control: Lidocaine 4% Topical N/A N/A Solution Tissue Debrided: Fibrin/Slough, Exudates, N/A N/A Subcutaneous Level: Skin/Subcutaneous N/A N/A Tissue Debridement Area (sq 2.4 N/A N/A cm): Instrument: Curette N/A N/A Bleeding: Minimum N/A N/A Hemostasis Achieved: Pressure N/A N/A Procedural Pain: 4 N/A N/A Post Procedural Pain: 3 N/A N/A Debridement Treatment Procedure was tolerated N/A N/A Response: well Post Debridement 4.8x0.5x0.4 N/A N/A Measurements L x W x D (cm) Post Debridement 0.754 N/A N/A Volume: (cm) Periwound Skin Texture: No Abnormalities Noted N/A N/A  Periwound Skin No Abnormalities Noted N/A N/A Moisture: Periwound Skin Color: No Abnormalities Noted N/A N/A Tenderness on No N/A N/A Palpation: Wound Preparation: Ulcer Cleansing: N/A N/A Rinsed/Irrigated with Saline Topical Anesthetic Applied: Other: lidocaine 4% Procedures Performed: Debridement N/A N/A Treatment Notes Wound #1 (Right, Lateral Lower Leg) 1. Cleansed with: Clean wound with Normal Saline 2. Anesthetic Topical Lidocaine 4% cream to wound bed prior to debridement 4. Dressing Applied: Aquacel Ag 5. Secondary Dressing Applied ABD Pad Dry  Gauze Kerlix/Conform 7. Secured with Tape Katherine [name], Katherine I. ([id number]) Notes drawtex Electronic Signature(s) Signed: 10/07/2016 11:19:49 AM By: Christin Fudge MD, FACS Entered By: Christin Fudge on 10/07/2016 11:19:49 [name] ([id number]) -------------------------------------------------------------------------------- Multi-Disciplinary Care Plan Details Patient Name: Katherine Juba I. Date of Service: 10/07/2016 9:45 AM Medical Record Number: [id number] Patient Account Number: [id number] Date of Birth/Sex: 1929/12/01 (83 y.o. Female) Treating RN: Carolyne Fiscal, Debi Primary Care Jessicah Croll: Thersa Salt Other Clinician: Referring Abdirizak Richison: Carlynn Spry Treating Marit Goodwill/Extender: Frann Rider in Treatment: 0 Active Inactive ` Abuse / Safety / Falls / Self Care Management Nursing Diagnoses: Potential for falls Goals: Patient will remain injury free Date Initiated: 10/07/2016 Target Resolution Date: 12/18/2016 Goal Status: Active Interventions: Assess fall risk on admission and as needed Assess impairment of mobility on admission and as needed per policy Notes: ` Nutrition Nursing Diagnoses: Imbalanced nutrition Goals: Patient/caregiver agrees to and verbalizes understanding of need to use nutritional supplements and/or vitamins as prescribed Date Initiated: 10/07/2016 Target Resolution Date: 12/18/2016 Goal Status: Active Interventions: Assess patient nutrition upon admission and as needed per policy Notes: ` Orientation to the Wound Care Program Nursing Diagnoses: Katherine [name], Katherine [name] ([id number]) Knowledge deficit related to the wound healing center program Goals: Patient/caregiver will verbalize understanding of the [facility] Program Date Initiated: 10/07/2016 Target Resolution Date: 10/23/2016 Goal Status: Active Interventions: Provide education on orientation to the wound center Notes: ` Pain, Acute or Chronic Nursing Diagnoses: Pain, acute or chronic:  actual or potential Potential alteration in comfort, pain Goals: Patient/caregiver will verbalize adequate pain control between visits Date Initiated: 10/07/2016 Target Resolution Date: 12/18/2016 Goal Status: Active Interventions: Assess comfort goal upon admission Complete pain assessment as per visit requirements Notes: ` Wound/Skin Impairment Nursing Diagnoses: Impaired tissue integrity Knowledge deficit related to smoking impact on wound healing Knowledge deficit related to ulceration/compromised skin integrity Goals: Ulcer/skin breakdown will have a volume reduction of 80% by week 12 Date Initiated: 10/07/2016 Target Resolution Date: 12/11/2016 Goal Status: Active Interventions: Assess patient/caregiver ability to perform ulcer/skin care regimen upon admission and as needed Assess ulceration(s) every visit Katherine [name], Katherine [name] ([id number]) Notes: Electronic Signature(s) Signed: 10/08/2016 4:53:33 PM By: Alric Quan Entered By: Alric Quan on 10/07/2016 10:33:01 Katherine [name] ([id number]) -------------------------------------------------------------------------------- Pain Assessment Details Patient Name: Katherine Juba I. Date of Service: 10/07/2016 9:45 AM Medical Record Number: [id number] Patient Account Number: [id number] Date of Birth/Sex: May 17, 1930 (83 y.o. Female) Treating RN: Carolyne Fiscal, Debi Primary Care Alonna Bartling: Thersa Salt Other Clinician: Referring Ryan Palermo: Carlynn Spry Treating Zeynab Klett/Extender: Frann Rider in Treatment: 0 Active Problems Location of Pain Severity and Description of Pain Patient Has Paino Yes Site Locations Pain Location: Pain in Ulcers With Dressing Change: Yes Duration of the Pain. Constant / Intermittento Constant Rate the pain. Current Pain Level: 7 Worst Pain Level: 10 Least Pain Level: 2 Character of Pain Describe the Pain: Aching, Burning, Tender, Throbbing Pain Management and Medication Current Pain  Management: Electronic Signature(s) Signed: 10/08/2016 4:53:33 PM By: Alric Quan Entered By: Alric Quan on 10/07/2016  [id number] Katherine [name], Katherine [name] ([id number]) -------------------------------------------------------------------------------- Patient/Caregiver Education Details Patient Name: Katherine Juba I. Date of Service: 10/07/2016 9:45 AM Medical Record Number: [id number] Patient Account Number: [id number] Date of Birth/Gender: 06/15/1930 (83 y.o. Female) Treating RN: Carolyne Fiscal, Debi Primary Care Physician: Thersa Salt Other Clinician: Referring Physician: Carlynn Spry Treating Physician/Extender: Frann Rider in Treatment: 0 Education Assessment Education Provided To: Patient Education Topics Provided Welcome To [facility]: Handouts: Welcome To [facility] Methods: Explain/Verbal Responses: State content correctly Wound/Skin Impairment: Handouts: Other: change dressing as ordered Methods: Demonstration, Explain/Verbal Responses: State content correctly Electronic Signature(s) Signed: 10/08/2016 4:53:33 PM By: Alric Quan Entered By: Alric Quan on 10/07/2016 10:34:01 Katherine Juba I. ([id number]) -------------------------------------------------------------------------------- Wound Assessment Details Patient Name: Katherine Juba I. Date of Service: 10/07/2016 9:45 AM Medical Record Number: [id number] Patient Account Number: [id number] Date of Birth/Sex: 1929/09/20 (83 y.o. Female) Treating RN: Carolyne Fiscal, Debi Primary Care Debby Clyne: Thersa Salt Other Clinician: Referring Donja Tipping: Carlynn Spry Treating Alaine Loughney/Extender: Frann Rider in Treatment: 0 Wound Status Wound Number: 1 Primary Open Surgical Wound Etiology: Wound Location: Right Lower Leg - Lateral Wound Open Wounding Event: Surgical Injury Status: Date Acquired: 08/24/2016 Comorbid Congestive Heart Failure, Weeks Of Treatment: 0 History: Hypertension, Myocardial  Infarction, Clustered Wound: No Osteoarthritis, Neuropathy Photos Photo Uploaded By: Alric Quan on 10/07/2016 13:11:05 Wound Measurements Length: (cm) 4.8 Width: (cm) 0.5 Depth: (cm) 0.4 Area: (cm) 1.885 Volume: (cm) 0.754 % Reduction in Area: % Reduction in Volume: Epithelialization: None Tunneling: No Undermining: No Wound Description Classification: Partial Thickness Foul Odor Afte Wound Margin: Distinct, outline attached Slough/Fibrino Exudate Amount: Large Exudate Type: Serous Exudate Color: amber r Cleansing: No Yes Wound Bed Granulation Amount: None Present (0%) Exposed Structure Necrotic Amount: Large (67-100%) Fat Layer (Subcutaneous Tissue) Exposed: Yes Necrotic Quality: Adherent Slough [place], [place] I. ([id number]) Periwound Skin Texture Texture Color No Abnormalities Noted: No No Abnormalities Noted: No Moisture No Abnormalities Noted: No Wound Preparation Ulcer Cleansing: Rinsed/Irrigated with Saline Topical Anesthetic Applied: Other: lidocaine 4%, Treatment Notes Wound #1 (Right, Lateral Lower Leg) 1. Cleansed with: Clean wound with Normal Saline 2. Anesthetic Topical Lidocaine 4% cream to wound bed prior to debridement 4. Dressing Applied: Aquacel Ag 5. Secondary Dressing Applied ABD Pad Dry Gauze Kerlix/Conform 7. Secured with Tape Notes drawtex Electronic Signature(s) Signed: 10/08/2016 4:53:33 PM By: Alric Quan Entered By: Alric Quan on 10/07/2016 10:27:40 Katherine [name] ([id number]) -------------------------------------------------------------------------------- Vitals Details Patient Name: Katherine Juba I. Date of Service: 10/07/2016 9:45 AM Medical Record Number: [id number] Patient Account Number: [id number] Date of Birth/Sex: November 02, 1929 (83 y.o. Female) Treating RN: Carolyne Fiscal, Debi Primary Care Kyl Givler: Thersa Salt Other Clinician: Referring Jamelle Goldston: Carlynn Spry Treating Shritha Bresee/Extender: Frann Rider  in Treatment: 0 Vital Signs Time Taken: 09:56 Temperature (F): 97.9 Height (in): 61 Pulse (bpm): 53 Weight (lbs): 140 Respiratory Rate (breaths/min): 18 Source: Measured Blood Pressure (mmHg): 135/50 Body Mass Index (BMI): 26.4 Reference Range: 80 - 120 mg / dl Electronic Signature(s) Signed: 10/08/2016 4:53:33 PM By: Alric Quan Entered By: Alric Quan on 10/07/2016 09:58:32

## 2016-10-09 NOTE — Progress Notes (Signed)
[name], [name] ([id number]) Visit Report for 10/07/2016 Abuse/Suicide Risk Screen Details Patient Name: Katherine [name], Katherine [name] [name]. Date of Service: 10/07/2016 9:45 AM Medical Record Number: [id number] Patient Account Number: [id number] Date of Birth/Sex: [birth year] (83 y.o. Female) Treating RN: Carolyne Fiscal, Debi Primary Care Zai Chmiel: Thersa Salt Other Clinician: Referring Tirth Cothron: Carlynn Spry Treating Anthonio Mizzell/Extender: Frann Rider in Treatment: 0 Abuse/Suicide Risk Screen Items Answer ABUSE/SUICIDE RISK SCREEN: Has anyone close to you tried to hurt or harm you recentlyo No Do you feel uncomfortable with anyone in your familyo No Has anyone forced you do things that you didnot want to doo No Do you have any thoughts of harming yourselfo No Patient displays signs or symptoms of abuse and/or neglect. No Electronic Signature(s) Signed: 10/08/2016 4:53:33 PM By: Alric Quan Entered By: Alric Quan on 10/07/2016 10:09:19 [place], [place] ([id number]) -------------------------------------------------------------------------------- Activities of Daily Living Details Patient Name: Katherine [name]. Date of Service: 10/07/2016 9:45 AM Medical Record Number: [id number] Patient Account Number: [id number] Date of Birth/Sex: [birth year] (83 y.o. Female) Treating RN: Carolyne Fiscal, Debi Primary Care Luanna Weesner: Thersa Salt Other Clinician: Referring Tecumseh Yeagley: Carlynn Spry Treating Theresea Trautmann/Extender: Frann Rider in Treatment: 0 Activities of Daily Living Items Answer Activities of Daily Living (Please select one for each item) Drive Automobile Not Able Take Medications Completely Able Use Telephone Completely Able Care for Appearance Completely Able Use Toilet Need Assistance Bath / Shower Completely Able Dress Self Completely Able Feed Self Completely Able Walk Need Assistance Get In / Out Bed Need Assistance Housework Not Able Prepare Meals Not Able Handle Money Need  Assistance Shop for Self Need Assistance Electronic Signature(s) Signed: 10/08/2016 4:53:33 PM By: Alric Quan Entered By: Alric Quan on 10/07/2016 10:10:06 [name] ([id number]) -------------------------------------------------------------------------------- Education Assessment Details Patient Name: Katherine [name]. Date of Service: 10/07/2016 9:45 AM Medical Record Number: [id number] Patient Account Number: [id number] Date of Birth/Sex: 12/22/1929 (83 y.o. Female) Treating RN: Carolyne Fiscal, Debi Primary Care Teonna Coonan: Thersa Salt Other Clinician: Referring Timeka Goette: Carlynn Spry Treating Coree Brame/Extender: Frann Rider in Treatment: 0 Primary Learner Assessed: Patient Learning Preferences/Education Level/Primary Language Learning Preference: Explanation, Printed Material Highest Education Level: High School Preferred Language: English Cognitive Barrier Assessment/Beliefs Language Barrier: No Translator Needed: No Memory Deficit: No Emotional Barrier: No Cultural/Religious Beliefs Affecting Medical No Care: Physical Barrier Assessment Impaired Vision: Yes Glasses Impaired Hearing: Yes Hearing Aid Decreased Hand dexterity: No Knowledge/Comprehension Assessment Knowledge Level: Medium Comprehension Level: Medium Ability to understand written Medium instructions: Ability to understand verbal Medium instructions: Motivation Assessment Anxiety Level: Calm Cooperation: Cooperative Education Importance: Acknowledges Need Interest in Health Problems: Asks Questions Perception: Coherent Willingness to Engage in Self- Medium Management Activities: Readiness to Engage in Self- Medium Management Activities: Electronic Signature(s) [place], [place] [name]. ([id number]) Signed: 10/08/2016 4:53:33 PM By: Alric Quan Entered By: Alric Quan on 10/07/2016 10:10:34 [name]  ([id number]) -------------------------------------------------------------------------------- Fall Risk Assessment Details Patient Name: Katherine [name]. Date of Service: 10/07/2016 9:45 AM Medical Record Number: [id number] Patient Account Number: [id number] Date of Birth/Sex: Jul 23, 1930 (83 y.o. Female) Treating RN: Carolyne Fiscal, Debi Primary Care Raelene Trew: Thersa Salt Other Clinician: Referring Twylia Oka: Carlynn Spry Treating Deloros Beretta/Extender: Frann Rider in Treatment: 0 Fall Risk Assessment Items Have you had 2 or more falls in the last 12 monthso 0 Yes Have you had any fall that resulted in injury in the last 12 monthso 0 Yes FALL RISK ASSESSMENT: History of falling - immediate or within 3 months 25 Yes Secondary diagnosis 0 No Ambulatory aid None/bed rest/wheelchair/nurse 0 No Crutches/cane/walker  15 Yes Furniture 0 No IV Access/Saline Lock 0 No Gait/Training Normal/bed rest/immobile 0 No Weak 0 No Impaired 20 Yes Mental Status Oriented to own ability 0 Yes Electronic Signature(s) Signed: 10/08/2016 4:53:33 PM By: Alric Quan Entered By: Alric Quan on 10/07/2016 10:11:00 Katherine [name]. ([id number]) -------------------------------------------------------------------------------- Foot Assessment Details Patient Name: Katherine [name]. Date of Service: 10/07/2016 9:45 AM Medical Record Number: [id number] Patient Account Number: [id number] Date of Birth/Sex: September 08, 1929 (83 y.o. Female) Treating RN: Carolyne Fiscal, Debi Primary Care Dameer Speiser: Thersa Salt Other Clinician: Referring Aniya Jolicoeur: Carlynn Spry Treating Adaly Puder/Extender: Frann Rider in Treatment: 0 Foot Assessment Items Site Locations + = Sensation present, - = Sensation absent, C = Callus, U = Ulcer R = Redness, W = Warmth, M = Maceration, PU = Pre-ulcerative lesion F = Fissure, S = Swelling, D = Dryness Assessment Right: Left: Other Deformity: No No Prior Foot Ulcer: No No Prior Amputation:  No No Charcot Joint: No No Ambulatory Status: Ambulatory With Help Assistance Device: Walker Gait: Steady Electronic Signature(s) Signed: 10/08/2016 4:53:33 PM By: Alric Quan Entered By: Alric Quan on 10/07/2016 10:14:52 Katherine Juba IMarland Kitchen ([id number]) -------------------------------------------------------------------------------- Nutrition Risk Assessment Details Patient Name: Katherine [name]. Date of Service: 10/07/2016 9:45 AM Medical Record Number: [id number] Patient Account Number: [id number] Date of Birth/Sex: [birth year] (83 y.o. Female) Treating RN: Carolyne Fiscal, Debi Primary Care Kailer Heindel: Thersa Salt Other Clinician: Referring Audry Kauzlarich: Carlynn Spry Treating Alexanderjames Berg/Extender: Frann Rider in Treatment: 0 Height (in): 61 Weight (lbs): 140 Body Mass Index (BMI): 26.4 Nutrition Risk Assessment Items NUTRITION RISK SCREEN: [name] have an illness or condition that made me change the kind and/or 0 No amount of food [name] eat [name] eat fewer than two meals per day 0 No [name] eat few fruits and vegetables, or milk products 0 No [name] have three or more drinks of beer, liquor or wine almost every day 0 No [name] have tooth or mouth problems that make it hard for me to eat 0 No [name] don't always have enough money to buy the food [name] need 0 No [name] eat alone most of the time 0 No [name] take three or more different prescribed or over-the-counter drugs a 1 Yes day Without wanting to, [name] have lost or gained 10 pounds in the last six 0 No months [name] am not always physically able to shop, cook and/or feed myself 0 No Nutrition Protocols Good Risk Protocol Moderate Risk Protocol Electronic Signature(s) Signed: 10/08/2016 4:53:33 PM By: Alric Quan Entered By: Alric Quan on 10/07/2016 10:11:13

## 2016-10-11 DIAGNOSIS — I2510 Atherosclerotic heart disease of native coronary artery without angina pectoris: Secondary | ICD-10-CM | POA: Diagnosis not present

## 2016-10-11 DIAGNOSIS — R69 Illness, unspecified: Secondary | ICD-10-CM | POA: Diagnosis not present

## 2016-10-11 DIAGNOSIS — I5042 Chronic combined systolic (congestive) and diastolic (congestive) heart failure: Secondary | ICD-10-CM | POA: Diagnosis not present

## 2016-10-11 DIAGNOSIS — I255 Ischemic cardiomyopathy: Secondary | ICD-10-CM | POA: Diagnosis not present

## 2016-10-11 DIAGNOSIS — I110 Hypertensive heart disease with heart failure: Secondary | ICD-10-CM | POA: Diagnosis not present

## 2016-10-11 DIAGNOSIS — E785 Hyperlipidemia, unspecified: Secondary | ICD-10-CM | POA: Diagnosis not present

## 2016-10-11 DIAGNOSIS — S82851D Displaced trimalleolar fracture of right lower leg, subsequent encounter for closed fracture with routine healing: Secondary | ICD-10-CM | POA: Diagnosis not present

## 2016-10-11 DIAGNOSIS — S82841D Displaced bimalleolar fracture of right lower leg, subsequent encounter for closed fracture with routine healing: Secondary | ICD-10-CM | POA: Diagnosis not present

## 2016-10-11 DIAGNOSIS — M1990 Unspecified osteoarthritis, unspecified site: Secondary | ICD-10-CM | POA: Diagnosis not present

## 2016-10-11 DIAGNOSIS — J449 Chronic obstructive pulmonary disease, unspecified: Secondary | ICD-10-CM | POA: Diagnosis not present

## 2016-10-11 DIAGNOSIS — K219 Gastro-esophageal reflux disease without esophagitis: Secondary | ICD-10-CM | POA: Diagnosis not present

## 2016-10-12 DIAGNOSIS — I255 Ischemic cardiomyopathy: Secondary | ICD-10-CM | POA: Diagnosis not present

## 2016-10-12 DIAGNOSIS — J449 Chronic obstructive pulmonary disease, unspecified: Secondary | ICD-10-CM | POA: Diagnosis not present

## 2016-10-12 DIAGNOSIS — I2510 Atherosclerotic heart disease of native coronary artery without angina pectoris: Secondary | ICD-10-CM | POA: Diagnosis not present

## 2016-10-12 DIAGNOSIS — I5042 Chronic combined systolic (congestive) and diastolic (congestive) heart failure: Secondary | ICD-10-CM | POA: Diagnosis not present

## 2016-10-12 DIAGNOSIS — M1990 Unspecified osteoarthritis, unspecified site: Secondary | ICD-10-CM | POA: Diagnosis not present

## 2016-10-12 DIAGNOSIS — R69 Illness, unspecified: Secondary | ICD-10-CM | POA: Diagnosis not present

## 2016-10-12 DIAGNOSIS — S82851D Displaced trimalleolar fracture of right lower leg, subsequent encounter for closed fracture with routine healing: Secondary | ICD-10-CM | POA: Diagnosis not present

## 2016-10-12 DIAGNOSIS — I110 Hypertensive heart disease with heart failure: Secondary | ICD-10-CM | POA: Diagnosis not present

## 2016-10-12 DIAGNOSIS — K219 Gastro-esophageal reflux disease without esophagitis: Secondary | ICD-10-CM | POA: Diagnosis not present

## 2016-10-12 DIAGNOSIS — E785 Hyperlipidemia, unspecified: Secondary | ICD-10-CM | POA: Diagnosis not present

## 2016-10-12 NOTE — Telephone Encounter (Signed)
Is it ok to give verbal orders for PT?

## 2016-10-12 NOTE — Telephone Encounter (Signed)
Katherine [name] called and stated that they received the verbal orders for OT but they need verbal orders for Pt. Please advise, thank you!  Call @ [phone]

## 2016-10-12 NOTE — Telephone Encounter (Signed)
Yes

## 2016-10-13 DIAGNOSIS — E785 Hyperlipidemia, unspecified: Secondary | ICD-10-CM | POA: Diagnosis not present

## 2016-10-13 DIAGNOSIS — R69 Illness, unspecified: Secondary | ICD-10-CM | POA: Diagnosis not present

## 2016-10-13 DIAGNOSIS — I255 Ischemic cardiomyopathy: Secondary | ICD-10-CM | POA: Diagnosis not present

## 2016-10-13 DIAGNOSIS — S82851D Displaced trimalleolar fracture of right lower leg, subsequent encounter for closed fracture with routine healing: Secondary | ICD-10-CM | POA: Diagnosis not present

## 2016-10-13 DIAGNOSIS — M1990 Unspecified osteoarthritis, unspecified site: Secondary | ICD-10-CM | POA: Diagnosis not present

## 2016-10-13 DIAGNOSIS — K219 Gastro-esophageal reflux disease without esophagitis: Secondary | ICD-10-CM | POA: Diagnosis not present

## 2016-10-13 DIAGNOSIS — I2510 Atherosclerotic heart disease of native coronary artery without angina pectoris: Secondary | ICD-10-CM | POA: Diagnosis not present

## 2016-10-13 DIAGNOSIS — I5042 Chronic combined systolic (congestive) and diastolic (congestive) heart failure: Secondary | ICD-10-CM | POA: Diagnosis not present

## 2016-10-13 DIAGNOSIS — I110 Hypertensive heart disease with heart failure: Secondary | ICD-10-CM | POA: Diagnosis not present

## 2016-10-13 DIAGNOSIS — J449 Chronic obstructive pulmonary disease, unspecified: Secondary | ICD-10-CM | POA: Diagnosis not present

## 2016-10-13 NOTE — Telephone Encounter (Signed)
Venessa from [facility] has requested orders for Physical Therapy  Contact [phone]

## 2016-10-14 ENCOUNTER — Encounter: Payer: Medicare HMO | Attending: Surgery | Admitting: Surgery

## 2016-10-14 DIAGNOSIS — M84471G Pathological fracture, right ankle, subsequent encounter for fracture with delayed healing: Secondary | ICD-10-CM | POA: Insufficient documentation

## 2016-10-14 DIAGNOSIS — M1990 Unspecified osteoarthritis, unspecified site: Secondary | ICD-10-CM | POA: Insufficient documentation

## 2016-10-14 DIAGNOSIS — T8131XA Disruption of external operation (surgical) wound, not elsewhere classified, initial encounter: Secondary | ICD-10-CM | POA: Diagnosis not present

## 2016-10-14 DIAGNOSIS — T8189XA Other complications of procedures, not elsewhere classified, initial encounter: Secondary | ICD-10-CM | POA: Diagnosis not present

## 2016-10-14 DIAGNOSIS — I890 Lymphedema, not elsewhere classified: Secondary | ICD-10-CM | POA: Diagnosis not present

## 2016-10-14 DIAGNOSIS — I509 Heart failure, unspecified: Secondary | ICD-10-CM | POA: Insufficient documentation

## 2016-10-14 DIAGNOSIS — I252 Old myocardial infarction: Secondary | ICD-10-CM | POA: Diagnosis not present

## 2016-10-14 DIAGNOSIS — G629 Polyneuropathy, unspecified: Secondary | ICD-10-CM | POA: Insufficient documentation

## 2016-10-14 DIAGNOSIS — L97311 Non-pressure chronic ulcer of right ankle limited to breakdown of skin: Secondary | ICD-10-CM | POA: Insufficient documentation

## 2016-10-14 DIAGNOSIS — Y839 Surgical procedure, unspecified as the cause of abnormal reaction of the patient, or of later complication, without mention of misadventure at the time of the procedure: Secondary | ICD-10-CM | POA: Insufficient documentation

## 2016-10-14 DIAGNOSIS — I110 Hypertensive heart disease with heart failure: Secondary | ICD-10-CM | POA: Insufficient documentation

## 2016-10-14 DIAGNOSIS — Z7982 Long term (current) use of aspirin: Secondary | ICD-10-CM | POA: Insufficient documentation

## 2016-10-14 NOTE — Telephone Encounter (Signed)
Katherine [name] called and given verbal okay.

## 2016-10-15 ENCOUNTER — Telehealth: Payer: Self-pay | Admitting: Family Medicine

## 2016-10-15 DIAGNOSIS — I110 Hypertensive heart disease with heart failure: Secondary | ICD-10-CM | POA: Diagnosis not present

## 2016-10-15 DIAGNOSIS — J449 Chronic obstructive pulmonary disease, unspecified: Secondary | ICD-10-CM | POA: Diagnosis not present

## 2016-10-15 DIAGNOSIS — R69 Illness, unspecified: Secondary | ICD-10-CM | POA: Diagnosis not present

## 2016-10-15 DIAGNOSIS — M1990 Unspecified osteoarthritis, unspecified site: Secondary | ICD-10-CM | POA: Diagnosis not present

## 2016-10-15 DIAGNOSIS — E785 Hyperlipidemia, unspecified: Secondary | ICD-10-CM | POA: Diagnosis not present

## 2016-10-15 DIAGNOSIS — I2510 Atherosclerotic heart disease of native coronary artery without angina pectoris: Secondary | ICD-10-CM | POA: Diagnosis not present

## 2016-10-15 DIAGNOSIS — S82851D Displaced trimalleolar fracture of right lower leg, subsequent encounter for closed fracture with routine healing: Secondary | ICD-10-CM | POA: Diagnosis not present

## 2016-10-15 DIAGNOSIS — I255 Ischemic cardiomyopathy: Secondary | ICD-10-CM | POA: Diagnosis not present

## 2016-10-15 DIAGNOSIS — K219 Gastro-esophageal reflux disease without esophagitis: Secondary | ICD-10-CM | POA: Diagnosis not present

## 2016-10-15 DIAGNOSIS — I5042 Chronic combined systolic (congestive) and diastolic (congestive) heart failure: Secondary | ICD-10-CM | POA: Diagnosis not present

## 2016-10-15 NOTE — Telephone Encounter (Signed)
Tawanna Sat [phone] called from [facility] health regarding needing PT orders effective next week for the eval. Vm can be left. And the order will be faxed over for Dr Lacinda Axon to sign. Thank you!

## 2016-10-15 NOTE — Telephone Encounter (Signed)
Katherine [name] was called and told that orders had already been given to Katherine [name].

## 2016-10-15 NOTE — Progress Notes (Signed)
LUCILE, COCH ([id number]) Visit Report for 10/14/2016 Arrival Information Details Patient Name: Katherine [name], Katherine [name]. Date of Service: 10/14/2016 3:30 PM Medical Record Number: [id number] Patient Account Number: [id number] Date of Birth/Sex: 05/30/1930 (83 y.o. Female) Treating RN: Carolyne Fiscal, Debi Primary Care Lakayla Barrington: Thersa Salt Other Clinician: Referring Makayli Bracken: Thersa Salt Treating Elim Peale/Extender: Frann Rider in Treatment: 1 Visit Information History Since Last Visit All ordered tests and consults were completed: No Patient Arrived: Wheel Chair Added or deleted any medications: No Arrival Time: 15:26 Any new allergies or adverse reactions: No Accompanied By: niece Had a fall or experienced change in No Transfer Assistance: EasyPivot activities of daily living that may affect Patient Lift risk of falls: Patient Identification Verified: Yes Signs or symptoms of abuse/neglect since last No Secondary Verification Process Yes visito Completed: Hospitalized since last visit: No Patient Requires Transmission- No Has Dressing in Place as Prescribed: Yes Based Precautions: Pain Present Now: Yes Patient Has Alerts: No Electronic Signature(s) Signed: 10/14/2016 4:36:14 PM By: Alric Quan Entered By: Alric Quan on 10/14/2016 15:29:57 Katherine [name] ([id number]) -------------------------------------------------------------------------------- Encounter Discharge Information Details Patient Name: Katherine Juba [name]. Date of Service: 10/14/2016 3:30 PM Medical Record Number: [id number] Patient Account Number: [id number] Date of Birth/Sex: 11-03-1929 (83 y.o. Female) Treating RN: Carolyne Fiscal, Debi Primary Care Kaylan Yates: Thersa Salt Other Clinician: Referring Prosper Paff: Thersa Salt Treating Sean Macwilliams/Extender: Frann Rider in Treatment: 1 Encounter Discharge Information Items Discharge Pain Level: 0 Discharge Condition: Stable Ambulatory Status: Wheelchair Discharge  Destination: Home Transportation: Private Auto Accompanied By: neice Schedule Follow-up Appointment: Yes Medication Reconciliation completed No and provided to Patient/Care Vada Yellen: Provided on Clinical Summary of Care: 10/14/2016 Form Type Recipient Paper Patient DK Electronic Signature(s) Signed: 10/14/2016 4:05:04 PM By: Ruthine Dose Entered By: Ruthine Dose on 10/14/2016 Katherine [name], [place] ([id number]) -------------------------------------------------------------------------------- Lower Extremity Assessment Details Patient Name: Katherine Juba [name]. Date of Service: 10/14/2016 3:30 PM Medical Record Number: [id number] Patient Account Number: [id number] Date of Birth/Sex: 07/18/1930 (83 y.o. Female) Treating RN: Carolyne Fiscal, Debi Primary Care Chadrick Sprinkle: Thersa Salt Other Clinician: Referring Carmon Sahli: Thersa Salt Treating Rito Lecomte/Extender: Frann Rider in Treatment: 1 Vascular Assessment Pulses: Dorsalis Pedis Palpable: [Right:Yes] Posterior Tibial Extremity colors, hair growth, and conditions: Extremity Color: [Right:Mottled] Temperature of Extremity: [Right:Warm] Capillary Refill: [Right:< 3 seconds] Electronic Signature(s) Signed: 10/14/2016 4:36:14 PM By: Alric Quan Entered By: Alric Quan on 10/14/2016 15:37:16 Katherine Juba [name]. ([id number]) -------------------------------------------------------------------------------- Multi Wound Chart Details Patient Name: Katherine Juba [name]. Date of Service: 10/14/2016 3:30 PM Medical Record Number: [id number] Patient Account Number: [id number] Date of Birth/Sex: [birth year] (83 y.o. Female) Treating RN: Carolyne Fiscal, Debi Primary Care Jerson Furukawa: Thersa Salt Other Clinician: Referring Nyia Tsao: Thersa Salt Treating Rosa Wyly/Extender: Frann Rider in Treatment: 1 Vital Signs Height(in): 61 Pulse(bpm): 52 Weight(lbs): 140 Blood Pressure 114/58 (mmHg): Body Mass Index(BMI): 26 Temperature(F): 97.5 Respiratory  Rate 18 (breaths/min): Photos: [N/A:N/A] Wound Location: Right Lower Leg - Lateral N/A N/A Wounding Event: Surgical Injury N/A N/A Primary Etiology: Open Surgical Wound N/A N/A Comorbid History: Congestive Heart Failure, N/A N/A Hypertension, Myocardial Infarction, Osteoarthritis, Neuropathy Date Acquired: 08/24/2016 N/A N/A Weeks of Treatment: 1 N/A N/A Wound Status: Open N/A N/A Measurements L x W x D 2x0.5x0.2 N/A N/A (cm) Area (cm) : 0.785 N/A N/A Volume (cm) : 0.157 N/A N/A % Reduction in Area: 58.40% N/A N/A % Reduction in Volume: 79.20% N/A N/A Classification: Partial Thickness N/A N/A Exudate Amount: Large N/A N/A Exudate Type: Serous N/A N/A Exudate Color: amber N/A N/A Wound Margin: Distinct, outline attached N/A  N/A Granulation Amount: None Present (0%) N/A N/A Necrotic Amount: Large (67-100%) N/A N/A Katherine [name], Katherine IMarland Kitchen ([id number]) Necrotic Tissue: Eschar, Adherent Slough N/A N/A Exposed Structures: Fat Layer (Subcutaneous N/A N/A Tissue) Exposed: Yes Epithelialization: None N/A N/A Periwound Skin Texture: No Abnormalities Noted N/A N/A Periwound Skin No Abnormalities Noted N/A N/A Moisture: Periwound Skin Color: No Abnormalities Noted N/A N/A Tenderness on No N/A N/A Palpation: Wound Preparation: Ulcer Cleansing: N/A N/A Rinsed/Irrigated with Saline Topical Anesthetic Applied: Other: lidocaine 4% Treatment Notes Electronic Signature(s) Signed: 10/14/2016 4:04:32 PM By: Christin Fudge MD, FACS Entered By: Christin Fudge on 10/14/2016 16:04:32 Katherine [name] ([id number]) -------------------------------------------------------------------------------- [place] Details Patient Name: Katherine Juba [name]. Date of Service: 10/14/2016 3:30 PM Medical Record Number: [id number] Patient Account Number: [id number] Date of Birth/Sex: Oct 27, 1929 (83 y.o. Female) Treating RN: Carolyne Fiscal, Debi Primary Care Maimuna Leaman: Thersa Salt Other Clinician: Referring  Maleigh Bagot: Thersa Salt Treating Sopheap Boehle/Extender: Frann Rider in Treatment: 1 Active Inactive ` Abuse / Safety / Falls / Self Care Management Nursing Diagnoses: Potential for falls Goals: Patient will remain injury free Date Initiated: 10/07/2016 Target Resolution Date: 12/18/2016 Goal Status: Active Interventions: Assess fall risk on admission and as needed Assess impairment of mobility on admission and as needed per policy Notes: ` Nutrition Nursing Diagnoses: Imbalanced nutrition Goals: Patient/caregiver agrees to and verbalizes understanding of need to use nutritional supplements and/or vitamins as prescribed Date Initiated: 10/07/2016 Target Resolution Date: 12/18/2016 Goal Status: Active Interventions: Assess patient nutrition upon admission and as needed per policy Notes: ` Orientation to the Wound Care Program Nursing Diagnoses: Katherine [name], Katherine [name] ([id number]) Knowledge deficit related to the wound healing center program Goals: Patient/caregiver will verbalize understanding of the [place] Program Date Initiated: 10/07/2016 Target Resolution Date: 10/23/2016 Goal Status: Active Interventions: Provide education on orientation to the wound center Notes: ` Pain, Acute or Chronic Nursing Diagnoses: Pain, acute or chronic: actual or potential Potential alteration in comfort, pain Goals: Patient/caregiver will verbalize adequate pain control between visits Date Initiated: 10/07/2016 Target Resolution Date: 12/18/2016 Goal Status: Active Interventions: Assess comfort goal upon admission Complete pain assessment as per visit requirements Notes: ` Wound/Skin Impairment Nursing Diagnoses: Impaired tissue integrity Knowledge deficit related to smoking impact on wound healing Knowledge deficit related to ulceration/compromised skin integrity Goals: Ulcer/skin breakdown will have a volume reduction of 80% by week 12 Date Initiated: 10/07/2016 Target  Resolution Date: 12/11/2016 Goal Status: Active Interventions: Assess patient/caregiver ability to perform ulcer/skin care regimen upon admission and as needed Assess ulceration(s) every visit Katherine [name], Katherine [name] ([id number]) Notes: Electronic Signature(s) Signed: 10/14/2016 4:36:14 PM By: Alric Quan Entered By: Alric Quan on 10/14/2016 15:37:20 Katherine Juba [name]. ([id number]) -------------------------------------------------------------------------------- Pain Assessment Details Patient Name: Katherine Juba [name]. Date of Service: 10/14/2016 3:30 PM Medical Record Number: [id number] Patient Account Number: [id number] Date of Birth/Sex: 1930/03/10 (83 y.o. Female) Treating RN: Carolyne Fiscal, Debi Primary Care Daleyza Gadomski: Thersa Salt Other Clinician: Referring Rishika Mccollom: Thersa Salt Treating Cordarrel Stiefel/Extender: Frann Rider in Treatment: 1 Active Problems Location of Pain Severity and Description of Pain Patient Has Paino Yes Site Locations Pain Location: Pain in Ulcers With Dressing Change: Yes Rate the pain. Current Pain Level: 7 Character of Pain Describe the Pain: Aching, Burning Pain Management and Medication Current Pain Management: Electronic Signature(s) Signed: 10/14/2016 4:36:14 PM By: Alric Quan Entered By: Alric Quan on 10/14/2016 15:30:08 Katherine [name] ([id number]) -------------------------------------------------------------------------------- Patient/Caregiver Education Details Patient Name: Katherine Juba [name]. Date of Service: 10/14/2016 3:30 PM Medical Record Number: [id number] Patient Account Number:  [id number] Date of Birth/Gender: [birth year] (83 y.o. Female) Treating RN: Carolyne Fiscal, Debi Primary Care Physician: Thersa Salt Other Clinician: Referring Physician: Thersa Salt Treating Physician/Extender: Frann Rider in Treatment: 1 Education Assessment Education Provided To: Patient Education Topics Provided Wound/Skin Impairment: Handouts: Other:  change dressing as ordered Methods: Demonstration, Explain/Verbal Responses: State content correctly Electronic Signature(s) Signed: 10/14/2016 4:36:14 PM By: Alric Quan Entered By: Alric Quan on 10/14/2016 15:40:23 Katherine Juba [name]. ([id number]) -------------------------------------------------------------------------------- Wound Assessment Details Patient Name: Katherine Juba [name]. Date of Service: 10/14/2016 3:30 PM Medical Record Number: [id number] Patient Account Number: [id number] Date of Birth/Sex: [birth year] (83 y.o. Female) Treating RN: Carolyne Fiscal, Debi Primary Care Bethaney Oshana: Thersa Salt Other Clinician: Referring Amrie Gurganus: Thersa Salt Treating Talyah Seder/Extender: Frann Rider in Treatment: 1 Wound Status Wound Number: 1 Primary Open Surgical Wound Etiology: Wound Location: Right Lower Leg - Lateral Wound Open Wounding Event: Surgical Injury Status: Date Acquired: 08/24/2016 Comorbid Congestive Heart Failure, Weeks Of Treatment: 1 History: Hypertension, Myocardial Infarction, Clustered Wound: No Osteoarthritis, Neuropathy Photos Photo Uploaded By: Alric Quan on 10/14/2016 15:41:21 Wound Measurements Length: (cm) 2 Width: (cm) 0.5 Depth: (cm) 0.2 Area: (cm) 0.785 Volume: (cm) 0.157 % Reduction in Area: 58.4% % Reduction in Volume: 79.2% Epithelialization: None Tunneling: No Undermining: No Wound Description Classification: Partial Thickness Foul Odor Afte Wound Margin: Distinct, outline attached Slough/Fibrino Exudate Amount: Large Exudate Type: Serous Exudate Color: amber r Cleansing: No Yes Wound Bed Granulation Amount: None Present (0%) Exposed Structure Necrotic Amount: Large (67-100%) Fat Layer (Subcutaneous Tissue) Exposed: Yes Necrotic Quality: Eschar, Adherent Slough Katherine [name], Katherine [name]. ([id number]) Periwound Skin Texture Texture Color No Abnormalities Noted: No No Abnormalities Noted: No Moisture No Abnormalities Noted: No Wound  Preparation Ulcer Cleansing: Rinsed/Irrigated with Saline Topical Anesthetic Applied: Other: lidocaine 4%, Treatment Notes Wound #1 (Right, Lateral Lower Leg) 1. Cleansed with: Clean wound with Normal Saline 2. Anesthetic Topical Lidocaine 4% cream to wound bed prior to debridement 4. Dressing Applied: Aquacel Ag 5. Secondary Dressing Applied ABD Pad Dry Gauze Foam 7. Secured with Tape 3 Layer Compression System - Right Lower Extremity Notes unna to anchor Electronic Signature(s) Signed: 10/14/2016 4:36:14 PM By: Alric Quan Entered By: Alric Quan on 10/14/2016 15:35:54 Katherine Juba [name]. ([id number]) -------------------------------------------------------------------------------- Vitals Details Patient Name: Katherine Juba [name]. Date of Service: 10/14/2016 3:30 PM Medical Record Number: [id number] Patient Account Number: [id number] Date of Birth/Sex: 1930-01-12 (83 y.o. Female) Treating RN: Carolyne Fiscal, Debi Primary Care Hailei Besser: Thersa Salt Other Clinician: Referring Eldred Lievanos: Thersa Salt Treating Francie Keeling/Extender: Frann Rider in Treatment: 1 Vital Signs Time Taken: 15:30 Temperature (F): 97.5 Height (in): 61 Pulse (bpm): 52 Weight (lbs): 140 Respiratory Rate (breaths/min): 18 Body Mass Index (BMI): 26.4 Blood Pressure (mmHg): 114/58 Reference Range: 80 - 120 mg / dl Electronic Signature(s) Signed: 10/14/2016 4:36:14 PM By: Alric Quan Entered By: Alric Quan on 10/14/2016 15:32:47

## 2016-10-15 NOTE — Progress Notes (Signed)
Katherine, [name] ([id number]) Visit Report for 10/14/2016 Chief Complaint Document Details Patient Name: Katherine, [name] I. Date of Service: 10/14/2016 3:30 PM Medical Record Number: [id number] Patient Account Number: [id number] Date of Birth/Sex: Aug 27, 1929 (83 y.o. Female) Treating RN: Katherine [name], Katherine [name] Primary Care Provider: Thersa [name] Other Clinician: Referring Provider: Thersa [name] Treating Provider/Extender: Katherine [name] in Treatment: 1 Information Obtained from: Patient Chief Complaint Patient presents to the wound care center with open non-healing surgical wound(s) of the right ankle after a trimalleolar fracture was repaired ion 9th January 2018 Electronic Signature(s) Signed: 10/14/2016 4:04:41 PM By: Katherine Fudge MD, FACS Entered By: Katherine [name] on 10/14/2016 16:04:40 Katherine Juba I. ([id number]) -------------------------------------------------------------------------------- HPI Details Patient Name: Katherine Juba I. Date of Service: 10/14/2016 3:30 PM Medical Record Number: [id number] Patient Account Number: [id number] Date of Birth/Sex: 27-Nov-1929 (83 y.o. Female) Treating RN: Katherine [name], Katherine [name] Primary Care Provider: Thersa [name] Other Clinician: Referring Provider: Thersa [name] Treating Provider/Extender: Katherine [name] in Treatment: 1 History of Present Illness Location: right lateral ankle Quality: Patient reports experiencing a dull pain to affected area(s). Severity: Patient states wound (s) are getting better. Duration: Patient has had the wound for < 6 weeks prior to presenting for treatment Timing: Pain in wound is constant (hurts all the time) Context: The wound appeared gradually over time Modifying Factors: Other treatment(s) tried include:nonweightbearing boot Associated Signs and Symptoms: Patient reports having increase swelling. HPI Description: 83 year old patient who has recently been seen several times at the orthopedic office for problems with her wound  healing after she had a right ankle trimalleolar fracture repaired on 08/24/2016. He simply did notice some drainage from the incision and she was reviewed in the orthopedic office on 10/01/2016 and started on Keflex and Septra. She was sent to [place] for an opinion is also than be following up with the orthopedic physician. Most recent x-rays taken of the orthopedic office were within normal limits. The patient does not have any significant history of diabetes mellitus or smoking but has had edema problems in the legs and does say that she's seen a vein surgeon in the past. All this is mainly done in [place], [place] where she normally resides. Electronic Signature(s) Signed: 10/14/2016 4:05:22 PM By: Katherine Fudge MD, FACS Entered By: Katherine [name] on 10/14/2016 16:05:22 Katherine [name] ([id number]) -------------------------------------------------------------------------------- Physical Exam Details Patient Name: Katherine Juba I. Date of Service: 10/14/2016 3:30 PM Medical Record Number: [id number] Patient Account Number: [id number] Date of Birth/Sex: 1929/10/19 (83 y.o. Female) Treating RN: Katherine [name], Katherine [name] Primary Care Provider: Thersa [name] Other Clinician: Referring Provider: Thersa [name] Treating Provider/Extender: Katherine [name] in Treatment: 1 Constitutional . Pulse regular. Respirations normal and unlabored. Afebrile. . Eyes Nonicteric. Reactive to light. Ears, Nose, Mouth, and Throat Lips, teeth, and gums WNL.Marland Kitchen Moist mucosa without lesions. Neck supple and nontender. No palpable supraclavicular or cervical adenopathy. Normal sized without goiter. Respiratory WNL. No retractions.. Breath sounds WNL, No rubs, rales, rhonchi, or wheeze.. Cardiovascular Heart rhythm and rate regular, no murmur or gallop.. Pedal Pulses WNL. No clubbing, cyanosis or edema. Chest Breasts symmetical and no nipple discharge.. Breast tissue WNL, no masses, lumps, or tenderness.. Lymphatic No adneopathy. No  adenopathy. No adenopathy. Musculoskeletal Adexa without tenderness or enlargement.. Digits and nails w/o clubbing, cyanosis, infection, petechiae, ischemia, or inflammatory conditions.. Integumentary (Hair, Skin) No suspicious lesions. No crepitus or fluctuance. No peri-wound warmth or erythema. No masses.Marland Kitchen Psychiatric Judgement and insight Intact.. No evidence of depression, anxiety, or agitation.. Notes the linear scar on the right  lateral lower leg above the ankle continues to look better and the lymphedema is well controlled. There is no active weeping or discharge Electronic Signature(s) Signed: 10/14/2016 4:06:01 PM By: Katherine Fudge MD, FACS Entered By: Katherine [name] on 10/14/2016 16:06:01 Katherine [name] ([id number]) -------------------------------------------------------------------------------- Physician Orders Details Patient Name: Katherine Juba I. Date of Service: 10/14/2016 3:30 PM Medical Record Number: [id number] Patient Account Number: [id number] Date of Birth/Sex: Sep 01, 1929 (83 y.o. Female) Treating RN: Katherine [name], Katherine [name] Primary Care Provider: Thersa [name] Other Clinician: Referring Provider: Thersa [name] Treating Provider/Extender: Katherine [name] in Treatment: 1 Verbal / Phone Orders: Yes Clinician: [place], Katherine [name] Read Back and Verified: Yes Diagnosis Coding Wound Cleansing Wound #1 Right,Lateral Lower Leg o Clean wound with wound cleanser. Anesthetic Wound #1 Right,Lateral Lower Leg o Topical Lidocaine 4% cream applied to wound bed prior to debridement - for clinic use Primary Wound Dressing Wound #1 Right,Lateral Lower Leg o Aquacel Ag Secondary Dressing Wound #1 Right,Lateral Lower Leg o Gauze, ABD and Kerlix/Conform o Foam Dressing Change Frequency Wound #1 Right,Lateral Lower Leg o Change dressing every week - pt to get changed at wound care center next week Follow-up Appointments Wound #1 Right,Lateral Lower Leg o Return Appointment in 1  week. Edema Control Wound #1 Right,Lateral Lower Leg o 3 Layer Compression System - Right Lower Extremity - unna to anchor o Elevate legs to the level of the heart and pump ankles as often as possible Additional Orders / Instructions Wound #1 Right,Lateral Lower Leg o Increase protein intake. Katherine [name], Katherine I. ([id number]) Home Health Wound #1 Right,Lateral Lower Leg o [place] Visits - [place] Nurse may visit PRN to address patientos wound care needs. o FACE TO FACE ENCOUNTER: MEDICARE and MEDICAID PATIENTS: I certify that this patient is under my care and that I had a face-to-face encounter that meets the physician face-to-face encounter requirements with this patient on this date. The encounter with the patient was in whole or in part for the following MEDICAL CONDITION: (primary reason for [place]) MEDICAL NECESSITY: I certify, that based on my findings, NURSING services are a medically necessary home health service. HOME BOUND STATUS: I certify that my clinical findings support that this patient is homebound (Maddene., Due to illness or injury, pt requires aid of supportive devices such as crutches, cane, wheelchairs, walkers, the use of special transportation or the assistance of another person to leave their place of residence. There is a normal inability to leave the home and doing so requires considerable and taxing effort. Other absences are for medical reasons / religious services and are infrequent or of short duration when for other reasons). o If current dressing causes regression in wound condition, may D/C ordered dressing product/s and apply Normal Saline Moist Dressing daily until next [place] / Other MD appointment. [place] of regression in wound condition at [phone]. o Please direct any NON-WOUND related issues/requests for orders to patient's Primary Care Physician Electronic  Signature(s) Signed: 10/14/2016 4:14:08 PM By: Katherine Fudge MD, FACS Signed: 10/14/2016 4:36:14 PM By: Alric Quan Entered By: Alric Quan on 10/14/2016 16:12:31 Katherine Juba IMarland Kitchen ([id number]) -------------------------------------------------------------------------------- Problem List Details Patient Name: Katherine Juba I. Date of Service: 10/14/2016 3:30 PM Medical Record Number: [id number] Patient Account Number: [id number] Date of Birth/Sex: Jan 10, 1930 (83 y.o. 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 ([id number]) -------------------------------------------------------------------------------- Progress Note Details Patient Name: Katherine Juba I. Date of Service: 10/14/2016 3:30 PM Medical Record Number: [id number] Patient Account Number: [id number] Date of Birth/Sex: [birth year] (83 y.o. Female) Treating RN: Ahmed Prima Primary Care Provider: Thersa [name] Other Clinician: Referring Provider: Thersa [name] Treating Provider/Extender: Katherine [name] in Treatment: 1 Subjective Chief Complaint Information obtained from Patient Patient presents to the wound care center with open non-healing surgical wound(s) of  the right ankle after a trimalleolar fracture was repaired ion 9th January 2018 History of Present Illness (HPI) The following HPI elements were documented for the patient's wound: Location: right lateral ankle Quality: Patient reports experiencing a dull pain to affected area(s). Severity: Patient states wound (s) are getting better. Duration: Patient has had the wound for < 6 weeks prior to presenting for treatment Timing: Pain in wound is constant (hurts all the time) Context: The wound appeared gradually over time Modifying Factors: Other treatment(s) tried include:nonweightbearing boot Associated Signs and Symptoms: Patient reports having increase swelling. 83 year old patient who has recently been seen several times at the orthopedic office for problems with her wound healing after she had a right ankle trimalleolar fracture repaired on 08/24/2016. He simply did notice some drainage from the incision and she was reviewed in the orthopedic office on 10/01/2016 and started on Keflex and Septra. She was sent to [place] for an opinion is also than be following up with the orthopedic physician. Most recent x-rays taken of the orthopedic office were within normal limits. The patient does not have any significant history of diabetes mellitus or smoking but has had edema problems in the legs and does say that she's seen a vein surgeon in the past. All this is mainly done in [place], [place] where she normally resides. Objective Constitutional Pulse regular. Respirations normal and unlabored. Afebrile. Vitals Time Taken: 3:30 PM, Height: 61 in, Weight: 140 lbs, BMI: 26.4, Temperature: 97.5 F, Pulse: 52 bpm, Respiratory Rate: 18 breaths/min, Blood Pressure: 114/58 mmHg. Sherrill, Arnett I. ([id number]) Eyes Nonicteric. Reactive to light. Ears, Nose, Mouth, and Throat Lips, teeth, and gums WNL.Marland Kitchen Moist mucosa without lesions. Neck supple and nontender. No palpable supraclavicular or cervical  adenopathy. Normal sized without goiter. Respiratory WNL. No retractions.. Breath sounds WNL, No rubs, rales, rhonchi, or wheeze.. Cardiovascular Heart rhythm and rate regular, no murmur or gallop.. Pedal Pulses WNL. No clubbing, cyanosis or edema. Chest Breasts symmetical and no nipple discharge.. Breast tissue WNL, no masses, lumps, or tenderness.. Lymphatic No adneopathy. No adenopathy. No adenopathy. Musculoskeletal Adexa without tenderness or enlargement.. Digits and nails w/o clubbing, cyanosis, infection, petechiae, ischemia, or inflammatory conditions.Marland Kitchen Psychiatric Judgement and insight Intact.. No evidence of depression, anxiety, or agitation.. General Notes: the linear scar on the right lateral lower leg above the ankle continues to look better and the lymphedema is well controlled. There is no active weeping or discharge Integumentary (Hair, Skin) No suspicious lesions. No crepitus or fluctuance. No peri-wound warmth or erythema. No masses.. Wound #1 status is Open. Original cause of wound was Surgical Injury. The wound is located  on the Right,Lateral Lower Leg. The wound measures 2cm length x 0.5cm width x 0.2cm depth; 0.785cm^2 area and 0.157cm^3 volume. There is Fat Layer (Subcutaneous Tissue) Exposed exposed. There is no tunneling or undermining noted. There is a large amount of serous drainage noted. The wound margin is distinct with the outline attached to the wound base. There is no granulation within the wound bed. There is a large (67- 100%) amount of necrotic tissue within the wound bed including Eschar and Adherent Slough. Assessment Active Problems Katherine [name], Katherine I. 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 Anesthetic: Wound #1 Right,Lateral Lower Leg: Topical Lidocaine 4% cream applied to wound bed prior to debridement - for clinic use Primary Wound Dressing: Wound #1 Right,Lateral Lower Leg: Aquacel Ag Secondary Dressing: Wound #1 Right,Lateral Lower Leg: Gauze, ABD and Kerlix/Conform Foam Dressing Change Frequency: Wound #1 Right,Lateral Lower Leg: Change dressing every week - pt to get changed at wound care center next week Follow-up Appointments: Wound #1 Right,Lateral Lower Leg: Return Appointment in 1 week. Edema Control: Wound #1 Right,Lateral Lower Leg: 3 Layer Compression System - Right Lower Extremity - unna to anchor Elevate legs to the level of the heart and pump ankles as often as possible Additional Orders / Instructions: Wound #1 Right,Lateral Lower Leg: Increase protein intake. Home Health: Wound #1 Right,Lateral Lower Leg: Continue Home Health Visits - [place] Nurse may visit PRN to address patient s wound care needs. FACE TO FACE ENCOUNTER: MEDICARE and MEDICAID PATIENTS: I certify that this patient is under my care and that I had a face-to-face encounter that meets the physician face-to-face encounter requirements with this patient on this date. The encounter with the patient was in whole or in part for the following MEDICAL CONDITION: (primary reason for [place]) MEDICAL NECESSITY: I certify, that based on my findings, NURSING services are a medically necessary home health service. HOME BOUND STATUS: I certify that my clinical findings support that this patient is homebound (Maddene., Due to illness or injury, pt requires aid of supportive devices such as crutches, cane, wheelchairs, walkers, the use [place], [place] I. ([id number]) of special transportation or the assistance of another person to leave  their place of residence. There is a normal inability to leave the home and doing so requires considerable and taxing effort. Other absences are for medical reasons / religious services and are infrequent or of short duration when for other reasons). If current dressing causes regression in wound condition, may D/C ordered dressing product/s and apply Normal Saline Moist Dressing daily until next [place] / Other MD appointment. [place] of regression in wound condition at [phone]. Please direct any NON-WOUND related issues/requests for orders to patient's Primary Care Physician after review I have recommended: 1. Silver alginate to be covered with Drawtex and some gauze padding and wrapped with a Profore lite 2. Elevation has been recommended 3. Phisical Therapy as per the orthopedic physician's orders 4. review next weeks after the orthopedic opinion Electronic Signature(s) Signed: 10/14/2016 4:26:11 PM By: Katherine Fudge MD, FACS Previous Signature: 10/14/2016 4:07:27 PM Version By: Katherine Fudge MD, FACS Entered By: Katherine [name] on 10/14/2016 16:26:11 Katherine [name], Katherine I. (  [id number]) -------------------------------------------------------------------------------- SuperBill Details Patient Name: Katherine [name], Katherine I. Date of Service: 10/14/2016 Medical Record Number: [id number] Patient Account Number: [id number] Date of Birth/Sex: [birth year] (83 y.o. 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 I89.0 Lymphedema, not elsewhere classified M84.471G Pathological fracture, right  ankle, subsequent encounter for fracture with delayed healing Facility Procedures CPT4: Description Modifier Quantity Code [id number] (Facility Use Only) [phone] - [place] RT 1 LEG Physician Procedures CPT4: Description Modifier Quantity Code [id number] - WC PHYS LEVEL 3 - EST PT 1 ICD-10 Description Diagnosis T81.31XA Disruption of external operation (surgical) wound, not elsewhere classified, initial encounter L97.311 Non-pressure chronic ulcer of  right ankle limited to breakdown of skin I89.0 Lymphedema, not elsewhere classified M84.471G Pathological fracture, right ankle, subsequent encounter for fracture with delayed healing Electronic Signature(s) Signed: 10/14/2016 4:18:47 PM By: Katherine Fudge MD, FACS Signed: 10/14/2016 4:36:14 PM By: Alric Quan Previous Signature: 10/14/2016 4:07:40 PM Version By: Katherine Fudge MD, FACS Entered By: Alric Quan on 10/14/2016 16:16:10

## 2016-10-18 ENCOUNTER — Other Ambulatory Visit: Payer: Self-pay | Admitting: Family Medicine

## 2016-10-18 MED ORDER — VITAMIN B-1 100 MG PO TABS: 100.0000 mg | ORAL_TABLET | Freq: Every day | ORAL | 1 refills | Status: AC

## 2016-10-18 NOTE — Telephone Encounter (Signed)
B-1 and vitamin D3 are historical medication. Pt last seen 03/25/16. Please advise?

## 2016-10-19 ENCOUNTER — Telehealth: Payer: Self-pay | Admitting: Family Medicine

## 2016-10-19 DIAGNOSIS — S82851D Displaced trimalleolar fracture of right lower leg, subsequent encounter for closed fracture with routine healing: Secondary | ICD-10-CM | POA: Diagnosis not present

## 2016-10-19 DIAGNOSIS — I255 Ischemic cardiomyopathy: Secondary | ICD-10-CM | POA: Diagnosis not present

## 2016-10-19 DIAGNOSIS — E785 Hyperlipidemia, unspecified: Secondary | ICD-10-CM | POA: Diagnosis not present

## 2016-10-19 DIAGNOSIS — I110 Hypertensive heart disease with heart failure: Secondary | ICD-10-CM | POA: Diagnosis not present

## 2016-10-19 DIAGNOSIS — R69 Illness, unspecified: Secondary | ICD-10-CM | POA: Diagnosis not present

## 2016-10-19 DIAGNOSIS — I5042 Chronic combined systolic (congestive) and diastolic (congestive) heart failure: Secondary | ICD-10-CM | POA: Diagnosis not present

## 2016-10-19 DIAGNOSIS — J449 Chronic obstructive pulmonary disease, unspecified: Secondary | ICD-10-CM | POA: Diagnosis not present

## 2016-10-19 DIAGNOSIS — M1990 Unspecified osteoarthritis, unspecified site: Secondary | ICD-10-CM | POA: Diagnosis not present

## 2016-10-19 DIAGNOSIS — K219 Gastro-esophageal reflux disease without esophagitis: Secondary | ICD-10-CM | POA: Diagnosis not present

## 2016-10-19 DIAGNOSIS — I2510 Atherosclerotic heart disease of native coronary artery without angina pectoris: Secondary | ICD-10-CM | POA: Diagnosis not present

## 2016-10-19 NOTE — Telephone Encounter (Signed)
Pt called requesting a refill on Aspirin 325 mg (she thinks it is from when she broke her ankle) and she is also requesting Vitamin D, Ergocalciferol, (DRISDOL) 50000 units CAPS capsule. Please advise, thank you!  Pharmacy - [id number] - [place], [place]  Call pt @ [phone]

## 2016-10-19 NOTE — Telephone Encounter (Signed)
Pt will need to schedule an appt for this medication. This is per refill request in chart.

## 2016-10-19 NOTE — Telephone Encounter (Signed)
Pt scheduled for 3/8 @ 2:30.

## 2016-10-20 ENCOUNTER — Telehealth: Payer: Self-pay | Admitting: Family Medicine

## 2016-10-20 NOTE — Telephone Encounter (Signed)
Yes

## 2016-10-20 NOTE — Telephone Encounter (Signed)
Mickel Baas from [facility] called and is asking for verbal orders for Physical therapy for 2x for 6 weeks. Please advise, thank you!  Call [place] @ [phone]

## 2016-10-20 NOTE — Telephone Encounter (Signed)
Called and gave Katherine [name] orders as stated below

## 2016-10-20 NOTE — Telephone Encounter (Signed)
Verbal okay to give? 

## 2016-10-21 ENCOUNTER — Ambulatory Visit: Payer: Medicare HMO | Admitting: Family Medicine

## 2016-10-21 DIAGNOSIS — J449 Chronic obstructive pulmonary disease, unspecified: Secondary | ICD-10-CM | POA: Diagnosis not present

## 2016-10-21 DIAGNOSIS — K219 Gastro-esophageal reflux disease without esophagitis: Secondary | ICD-10-CM | POA: Diagnosis not present

## 2016-10-21 DIAGNOSIS — I255 Ischemic cardiomyopathy: Secondary | ICD-10-CM | POA: Diagnosis not present

## 2016-10-21 DIAGNOSIS — R69 Illness, unspecified: Secondary | ICD-10-CM | POA: Diagnosis not present

## 2016-10-21 DIAGNOSIS — I110 Hypertensive heart disease with heart failure: Secondary | ICD-10-CM | POA: Diagnosis not present

## 2016-10-21 DIAGNOSIS — I2510 Atherosclerotic heart disease of native coronary artery without angina pectoris: Secondary | ICD-10-CM | POA: Diagnosis not present

## 2016-10-21 DIAGNOSIS — E785 Hyperlipidemia, unspecified: Secondary | ICD-10-CM | POA: Diagnosis not present

## 2016-10-21 DIAGNOSIS — I5042 Chronic combined systolic (congestive) and diastolic (congestive) heart failure: Secondary | ICD-10-CM | POA: Diagnosis not present

## 2016-10-21 DIAGNOSIS — M1990 Unspecified osteoarthritis, unspecified site: Secondary | ICD-10-CM | POA: Diagnosis not present

## 2016-10-21 DIAGNOSIS — S82851D Displaced trimalleolar fracture of right lower leg, subsequent encounter for closed fracture with routine healing: Secondary | ICD-10-CM | POA: Diagnosis not present

## 2016-10-22 ENCOUNTER — Encounter: Payer: Medicare HMO | Admitting: Surgery

## 2016-10-22 DIAGNOSIS — M1990 Unspecified osteoarthritis, unspecified site: Secondary | ICD-10-CM | POA: Diagnosis not present

## 2016-10-22 DIAGNOSIS — M84471G Pathological fracture, right ankle, subsequent encounter for fracture with delayed healing: Secondary | ICD-10-CM | POA: Diagnosis not present

## 2016-10-22 DIAGNOSIS — Y839 Surgical procedure, unspecified as the cause of abnormal reaction of the patient, or of later complication, without mention of misadventure at the time of the procedure: Secondary | ICD-10-CM | POA: Diagnosis not present

## 2016-10-22 DIAGNOSIS — I890 Lymphedema, not elsewhere classified: Secondary | ICD-10-CM | POA: Diagnosis not present

## 2016-10-22 DIAGNOSIS — G629 Polyneuropathy, unspecified: Secondary | ICD-10-CM | POA: Diagnosis not present

## 2016-10-22 DIAGNOSIS — I110 Hypertensive heart disease with heart failure: Secondary | ICD-10-CM | POA: Diagnosis not present

## 2016-10-22 DIAGNOSIS — T8131XA Disruption of external operation (surgical) wound, not elsewhere classified, initial encounter: Secondary | ICD-10-CM | POA: Diagnosis not present

## 2016-10-22 DIAGNOSIS — I252 Old myocardial infarction: Secondary | ICD-10-CM | POA: Diagnosis not present

## 2016-10-22 DIAGNOSIS — I509 Heart failure, unspecified: Secondary | ICD-10-CM | POA: Diagnosis not present

## 2016-10-22 DIAGNOSIS — T8189XA Other complications of procedures, not elsewhere classified, initial encounter: Secondary | ICD-10-CM | POA: Diagnosis not present

## 2016-10-22 DIAGNOSIS — L97311 Non-pressure chronic ulcer of right ankle limited to breakdown of skin: Secondary | ICD-10-CM | POA: Diagnosis not present

## 2016-10-23 NOTE — Progress Notes (Signed)
Katherine, [name] ([id number]) Visit Report for 10/22/2016 Chief Complaint Document Details Patient Name: Katherine [name], Katherine [name]. Date of Service: 10/22/2016 11:00 AM Medical Record Number: [id number] Patient Account Number: [id number] Date of Birth/Sex: [birth year] (83 y.o. Female) Treating RN: Baruch Gouty, RN, BSN, Velva Harman Primary Care Provider: Thersa Salt Other Clinician: Referring Provider: Thersa Salt Treating Provider/Extender: Frann Rider in Treatment: 2 Information Obtained from: Patient Chief Complaint Patient presents to the wound care center with open non-healing surgical wound(s) of the right ankle after a trimalleolar fracture was repaired ion 9th January 2018 Electronic Signature(s) Signed: 10/22/2016 11:32:39 AM By: Christin Fudge MD, FACS Entered By: Christin Fudge on 10/22/2016 11:32:39 Katherine Juba IMarland Kitchen ([id number]) -------------------------------------------------------------------------------- Debridement Details Patient Name: Katherine [name]. Date of Service: 10/22/2016 11:00 AM Medical Record Number: [id number] Patient Account Number: [id number] Date of Birth/Sex: 1929-11-23 (83 y.o. Female) Treating RN: Afful, RN, BSN, [place] Primary Care Provider: Thersa Salt Other Clinician: Referring Provider: Thersa Salt Treating Provider/Extender: Frann Rider in Treatment: 2 Debridement Performed for Wound #1 Right,Lateral Lower Leg Assessment: Performed By: Physician Christin Fudge, MD Debridement: Debridement Pre-procedure Yes - 11:22 Verification/Time Out Taken: Start Time: 11:22 Pain Control: Lidocaine 4% Topical Solution Level: Skin/Subcutaneous Tissue Total Area Debrided (L x 4.7 (cm) x 0.4 (cm) = 1.88 (cm) W): Tissue and other Viable, Non-Viable, Fibrin/Slough, Subcutaneous material debrided: Instrument: Curette Bleeding: Minimum Hemostasis Achieved: Pressure End Time: 11:26 Procedural Pain: 0 Post Procedural Pain: 0 Response to Treatment: Procedure was tolerated well Post  Debridement Measurements of Total Wound Length: (cm) 4.7 Width: (cm) 0.4 Depth: (cm) 0.2 Volume: (cm) 0.295 Character of Wound/Ulcer Post Stable Debridement: Severity of Tissue Post Debridement: Fat layer exposed Post Procedure Diagnosis Same as Pre-procedure Electronic Signature(s) Signed: 10/22/2016 11:32:33 AM By: Christin Fudge MD, FACS Signed: 10/22/2016 4:16:44 PM By: Regan Lemming BSN, RN Previous Signature: 10/22/2016 11:32:13 AM Version By: Christin Fudge MD, FACS Entered By: Christin Fudge on 10/22/2016 11:32:33 Katherine [name]. ([id number]) KAYLINE, SHEER [name]. ([id number]) -------------------------------------------------------------------------------- HPI Details Patient Name: Katherine [name]. Date of Service: 10/22/2016 11:00 AM Medical Record Number: [id number] Patient Account Number: [id number] Date of Birth/Sex: Mar 29, 1930 (83 y.o. Female) Treating RN: Baruch Gouty, RN, BSN, Velva Harman Primary Care Provider: Thersa Salt Other Clinician: Referring Provider: Thersa Salt Treating Provider/Extender: Frann Rider in Treatment: 2 History of Present Illness Location: right lateral ankle Quality: Patient reports experiencing a dull pain to affected area(s). Severity: Patient states wound (s) are getting better. Duration: Patient has had the wound for < 6 weeks prior to presenting for treatment Timing: Pain in wound is constant (hurts all the time) Context: The wound appeared gradually over time Modifying Factors: Other treatment(s) tried include:nonweightbearing boot Associated Signs and Symptoms: Patient reports having increase swelling. HPI Description: 83 year old patient who has recently been seen several times at the orthopedic office for problems with her wound healing after she had a right ankle trimalleolar fracture repaired on 08/24/2016. He simply did notice some drainage from the incision and she was reviewed in the orthopedic office on 10/01/2016 and started on Keflex and Septra. She  was sent to [place] for an opinion is also than be following up with the orthopedic physician. Most recent x-rays taken of the orthopedic office were within normal limits. The patient does not have any significant history of diabetes mellitus or smoking but has had edema problems in the legs and does say that she's seen a vein surgeon in the past. All this is mainly done in [place], [place] where she normally resides.  Electronic Signature(s) Signed: 10/22/2016 11:32:44 AM By: Christin Fudge MD, FACS Entered By: Christin Fudge on 10/22/2016 11:32:44 [name] ([id number]) -------------------------------------------------------------------------------- Physical Exam Details Patient Name: Katherine [name]. Date of Service: 10/22/2016 11:00 AM Medical Record Number: [id number] Patient Account Number: [id number] Date of Birth/Sex: 12-28-1929 (83 y.o. Female) Treating RN: Baruch Gouty, RN, BSN, Velva Harman Primary Care Provider: Thersa Salt Other Clinician: Referring Provider: Thersa Salt Treating Provider/Extender: Frann Rider in Treatment: 2 Constitutional . Pulse regular. Respirations normal and unlabored. Afebrile. . Eyes Nonicteric. Reactive to light. Ears, Nose, Mouth, and Throat Lips, teeth, and gums WNL.Marland Kitchen Moist mucosa without lesions. Neck supple and nontender. No palpable supraclavicular or cervical adenopathy. Normal sized without goiter. Respiratory WNL. No retractions.. Breath sounds WNL, No rubs, rales, rhonchi, or wheeze.. Cardiovascular Heart rhythm and rate regular, no murmur or gallop.. Pedal Pulses WNL. No clubbing, cyanosis or edema. Chest Breasts symmetical and no nipple discharge.. Breast tissue WNL, no masses, lumps, or tenderness.. Lymphatic No adneopathy. No adenopathy. No adenopathy. Musculoskeletal Adexa without tenderness or enlargement.. Digits and nails w/o clubbing, cyanosis, infection, petechiae, ischemia, or inflammatory conditions.. Integumentary (Hair, Skin) No  suspicious lesions. No crepitus or fluctuance. No peri-wound warmth or erythema. No masses.Marland Kitchen Psychiatric Judgement and insight Intact.. No evidence of depression, anxiety, or agitation.. Notes the right lower leg laterally continues to have a linear scar in some of the debris was sharply removed with a #3 curet and minimal bleeding controlled with pressure Electronic Signature(s) Signed: 10/22/2016 11:33:10 AM By: Christin Fudge MD, FACS Entered By: Christin Fudge on 10/22/2016 11:33:09 [name] ([id number]) -------------------------------------------------------------------------------- Physician Orders Details Patient Name: Katherine [name]. Date of Service: 10/22/2016 11:00 AM Medical Record Number: [id number] Patient Account Number: [id number] Date of Birth/Sex: 02-Mar-1930 (83 y.o. Female) Treating RN: Baruch Gouty, RN, BSN, Velva Harman Primary Care Provider: Thersa Salt Other Clinician: Referring Provider: Thersa Salt Treating Provider/Extender: Frann Rider in Treatment: 2 Verbal / Phone Orders: No Diagnosis Coding Wound Cleansing Wound #1 Right,Lateral Lower Leg o Clean wound with wound cleanser. Anesthetic Wound #1 Right,Lateral Lower Leg o Topical Lidocaine 4% cream applied to wound bed prior to debridement - for clinic use Primary Wound Dressing Wound #1 Right,Lateral Lower Leg o Aquacel Ag Secondary Dressing Wound #1 Right,Lateral Lower Leg o Gauze, ABD and Kerlix/Conform Dressing Change Frequency Wound #1 Right,Lateral Lower Leg o Change dressing every week - pt to get changed at wound care center next week Follow-up Appointments Wound #1 Right,Lateral Lower Leg o Return Appointment in 1 week. Edema Control Wound #1 Right,Lateral Lower Leg o 3 Layer Compression System - Right Lower Extremity - unna to anchor o Elevate legs to the level of the heart and pump ankles as often as possible Additional Orders / Instructions Wound #1 Right,Lateral Lower Leg o  Increase protein intake. [place] ([id number]) Wound #1 Right,Lateral Lower Leg o [place] Visits - [place] Nurse may visit PRN to address patientos wound care needs. o FACE TO FACE ENCOUNTER: MEDICARE and MEDICAID PATIENTS: [name] that this patient is under my care and that [name] had a face-to-face encounter that meets the physician face-to-face encounter requirements with this patient on this date. The encounter with the patient was in whole or in part for the following MEDICAL CONDITION: (primary reason for [place]) MEDICAL NECESSITY: [name], that based on my findings, NURSING services are a medically necessary home health service. HOME BOUND STATUS: [name] that my clinical findings support that this  patient is homebound (Maddene., Due to illness or injury, pt requires aid of supportive devices such as crutches, cane, wheelchairs, walkers, the use of special transportation or the assistance of another person to leave their place of residence. There is a normal inability to leave the home and doing so requires considerable and taxing effort. Other absences are for medical reasons / religious services and are infrequent or of short duration when for other reasons). o If current dressing causes regression in wound condition, may D/C ordered dressing product/s and apply Normal Saline Moist Dressing daily until next [place] / Other MD appointment. [place] of regression in wound condition at [phone]. o Please direct any NON-WOUND related issues/requests for orders to patient's Primary Care Physician Electronic Signature(s) Signed: 10/22/2016 3:56:49 PM By: Christin Fudge MD, FACS Signed: 10/22/2016 4:16:44 PM By: Regan Lemming BSN, RN Entered By: Regan Lemming on 10/22/2016 11:25:35 Katherine Juba IMarland Kitchen ([id number]) -------------------------------------------------------------------------------- Problem  List Details Patient Name: Katherine [name]. Date of Service: 10/22/2016 11:00 AM Medical Record Number: [id number] Patient Account Number: [id number] Date of Birth/Sex: 18-Dec-1929 (83 y.o. 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 Date of Service: 10/22/2016 11:00 AM Medical Record Number: [id number] Patient Account Number: [id number] Date of Birth/Sex: [birth year] (83 y.o. Female) Treating RN: Afful, RN, BSN, Velva Harman Primary Care Provider: Thersa Salt Other Clinician: Referring Provider: Thersa Salt Treating Provider/Extender: Frann Rider in Treatment: 2 Subjective Chief Complaint Information obtained from Patient Patient presents to the wound care center with open non-healing surgical wound(s) of the right ankle after a trimalleolar fracture was repaired ion 9th January 2018 History of Present Illness (HPI) The following HPI elements were documented for the patient's wound: Location: right lateral ankle Quality: Patient reports experiencing a dull pain to affected  area(s). Severity: Patient states wound (s) are getting better. Duration: Patient has had the wound for < 6 weeks prior to presenting for treatment Timing: Pain in wound is constant (hurts all the time) Context: The wound appeared gradually over time Modifying Factors: Other treatment(s) tried include:nonweightbearing boot Associated Signs and Symptoms: Patient reports having increase swelling. 83 year old patient who has recently been seen several times at the orthopedic office for problems with her wound healing after she had a right ankle trimalleolar fracture repaired on 08/24/2016. He simply did notice some drainage from the incision and she was reviewed in the orthopedic office on 10/01/2016 and started on Keflex and Septra. She was sent to [place] for an opinion is also than be following up with the orthopedic physician. Most recent x-rays taken of the orthopedic office were within normal limits. The patient does not have any significant history of diabetes mellitus or smoking but has had edema problems in the legs and does say that she's seen a vein surgeon in the past. All this is mainly done in [place],  [place] where she normally resides. Objective Constitutional Pulse regular. Respirations normal and unlabored. Afebrile. Vitals Time Taken: 11:00 AM, Height: 61 in, Weight: 140 lbs, BMI: 26.4, Temperature: 97.8 F, Pulse: 53 bpm, Respiratory Rate: 17 breaths/min, Blood Pressure: 147/38 mmHg. [place], [place] [name]. ([id number]) Eyes Nonicteric. Reactive to light. Ears, Nose, Mouth, and Throat Lips, teeth, and gums WNL.Marland Kitchen Moist mucosa without lesions. Neck supple and nontender. No palpable supraclavicular or cervical adenopathy. Normal sized without goiter. Respiratory WNL. No retractions.. Breath sounds WNL, No rubs, rales, rhonchi, or wheeze.. Cardiovascular Heart rhythm and rate regular, no murmur or gallop.. Pedal Pulses WNL. No clubbing, cyanosis or edema. Chest Breasts symmetical and no  nipple discharge.. Breast tissue WNL, no masses, lumps, or tenderness.. Lymphatic No adneopathy. No adenopathy. No adenopathy. Musculoskeletal Adexa without tenderness or enlargement.. Digits and nails w/o clubbing, cyanosis, infection, petechiae, ischemia, or inflammatory conditions.Marland Kitchen Psychiatric Judgement and insight Intact.. No evidence of depression, anxiety, or agitation.. General Notes: the right lower leg laterally continues to have a linear scar in some of the debris was sharply removed with a #3 curet and minimal bleeding controlled with pressure Integumentary (Hair, Skin) No suspicious lesions. No crepitus or fluctuance. No peri-wound warmth or erythema. No masses.. Wound #1 status is Open. Original cause of wound was Surgical Injury. The wound is located on the Right,Lateral Lower Leg. The wound measures 4.7cm length x 0.4cm width x 0.2cm depth; 1.477cm^2 area and 0.295cm^3 volume. There is Fat Layer (Subcutaneous Tissue) Exposed exposed. There is no tunneling or undermining noted. There is a small amount of serous drainage noted. The wound margin is distinct with the outline attached to the wound base. There is no granulation within the wound bed. There is a large (67- 100%) amount of necrotic tissue within the wound bed including Eschar and Adherent Slough. The periwound skin appearance did not exhibit: Callus, Crepitus, Excoriation, Induration, Rash, Scarring, Dry/Scaly, Maceration, Atrophie Blanche, Cyanosis, Ecchymosis, Hemosiderin Staining, Mottled, Pallor, Rubor, Erythema. Periwound temperature was noted as No Abnormality. The periwound has tenderness on palpation. Assessment ARIANIS, BOWDITCH [name]. 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 There was a Skin/Subcutaneous Tissue Debridement ([id number]) debridement with total area of 1.88 sq cm performed by Christin Fudge, MD. with the following instrument(s): Curette to remove Viable and Non-Viable tissue/material including Fibrin/Slough and Subcutaneous after achieving pain control using Lidocaine 4% Topical Solution. A time out was conducted at 11:22, prior to the start of the procedure. A Minimum amount of bleeding was controlled with Pressure. The procedure was tolerated well with a pain level of 0 throughout and a pain level of 0 following the procedure. Post Debridement Measurements: 4.7cm length x 0.4cm width x 0.2cm depth; 0.295cm^3 volume. Character of Wound/Ulcer Post Debridement is stable. Severity of Tissue Post Debridement is: Fat layer exposed. Post procedure Diagnosis Wound #1: Same as Pre-Procedure Plan Wound Cleansing: Wound #1 Right,Lateral Lower Leg: Clean wound with wound cleanser. Anesthetic: Wound #1 Right,Lateral Lower Leg: Topical Lidocaine 4% cream applied to wound bed prior to debridement - for clinic use Primary Wound Dressing: Wound #1 Right,Lateral Lower Leg: Aquacel Ag Secondary Dressing: Wound #1 Right,Lateral Lower Leg: Gauze, ABD and Kerlix/Conform  Dressing Change Frequency: Wound #1 Right,Lateral Lower Leg: REMEDY, CORPORAN [name]. ([id number]) Change dressing every week - pt to get changed at wound care center next week Follow-up Appointments: Wound #1 Right,Lateral Lower Leg: Return Appointment in 1 week. Edema Control: Wound #1 Right,Lateral Lower Leg: 3 Layer Compression System - Right Lower Extremity - unna to anchor Elevate legs to the level of the heart and pump ankles as often as possible Additional Orders / Instructions: Wound #1 Right,Lateral Lower  Leg: Increase protein intake. Home Health: Wound #1 Right,Lateral Lower Leg: Continue Home Health Visits - [place] Nurse may visit PRN to address patient s wound care needs. FACE TO FACE ENCOUNTER: MEDICARE and MEDICAID PATIENTS: [name] that this patient is under my care and that [name] had a face-to-face encounter that meets the physician face-to-face encounter requirements with this patient on this date. The encounter with the patient was in whole or in part for the following MEDICAL CONDITION: (primary reason for [place]) MEDICAL NECESSITY: [name], that based on my findings, NURSING services are a medically necessary home health service. HOME BOUND STATUS: [name] that my clinical findings support that this patient is homebound (Maddene., Due to illness or injury, pt requires aid of supportive devices such as crutches, cane, wheelchairs, walkers, the use of special transportation or the assistance of another person to leave their place of residence. There is a normal inability to leave the home and doing so requires considerable and taxing effort. Other absences are for medical reasons / religious services and are infrequent or of short duration when for other reasons). If current dressing causes regression in wound condition, may D/C ordered dressing product/s and apply Normal Saline Moist Dressing daily until next [place] / Other MD appointment. [place] of regression in wound condition at [phone]. Please direct any NON-WOUND related issues/requests for orders to patient's Primary Care Physician after review [name] have recommended: 1. Silver alginate to be covered with Drawtex and some gauze padding and wrapped with a Profore lite 2. Elevation has been recommended 3. Phisical Therapy as per the orthopedic physician's orders 4. review next weeks after the orthopedic opinion Electronic Signature(s) Signed: 10/22/2016 11:33:47 AM By:  Christin Fudge MD, FACS Entered By: Christin Fudge on 10/22/2016 11:33:46 Katherine [name]. ([id number]) -------------------------------------------------------------------------------- SuperBill Details Patient Name: Katherine [name]. Date of Service: 10/22/2016 Medical Record Number: [id number] Patient Account Number: [id number] Date of Birth/Sex: Jan 07, 1930 (84 y.o. 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 I89.0 Lymphedema, not elsewhere classified M84.471G Pathological fracture, right ankle, subsequent encounter for fracture with delayed healing Facility Procedures CPT4: Description Modifier Quantity Code [id number] 11042 - DEB SUBQ TISSUE 20 SQ CM/< 1 ICD-10 Description Diagnosis T81.31XA Disruption of external operation (surgical) wound, not elsewhere classified, initial encounter L97.311 Non-pressure chronic ulcer  of right ankle limited to breakdown of skin I89.0 Lymphedema, not elsewhere classified M84.471G Pathological fracture, right ankle, subsequent encounter for fracture with delayed healing Physician Procedures CPT4: Description Modifier Quantity Code [id number] 11042 - WC PHYS SUBQ TISS 20 SQ CM 1 ICD-10 Description Diagnosis T81.31XA Disruption of external operation (surgical) wound, not elsewhere classified, initial encounter L97.311 Non-pressure chronic ulcer  of right  ankle limited to breakdown of skin I89.0 Lymphedema, not elsewhere classified M84.471G Pathological fracture, right ankle, subsequent encounter for fracture with delayed healing CONSUELLA, [place] ([id number]) Electronic  Signature(s) Signed: 10/22/2016 11:36:50 AM By: Christin Fudge MD, FACS Entered By: Christin Fudge on 10/22/2016 11:36:49

## 2016-10-23 NOTE — Progress Notes (Signed)
Katherine [name], Katherine [name] ([id number]) Visit Report for 10/22/2016 Arrival Information Details Patient Name: Katherine [name], Katherine [name]. Date of Service: 10/22/2016 11:00 AM Medical Record Number: [id number] Patient Account Number: [id number] Date of Birth/Sex: June 02, 1930 (83 y.o. Female) Treating RN: Afful, RN, BSN, Velva Harman Primary Care Daton Szilagyi: Thersa Salt Other Clinician: Referring Emojean Gertz: Thersa Salt Treating Jaray Boliver/Extender: Frann Rider in Treatment: 2 Visit Information History Since Last Visit All ordered tests and consults were completed: No Patient Arrived: Wheel Chair Added or deleted any medications: No Arrival Time: 10:58 Any new allergies or adverse reactions: No Accompanied By: dtr Had a fall or experienced change in No activities of daily living that may affect Transfer Assistance: None risk of falls: Patient Identification Verified: Yes Signs or symptoms of abuse/neglect since last No Secondary Verification Process Yes visito Completed: Hospitalized since last visit: No Patient Requires Transmission-Based No Has Dressing in Place as Prescribed: Yes Precautions: Has Compression in Place as Prescribed: Yes Patient Has Alerts: No Pain Present Now: Yes Electronic Signature(s) Signed: 10/22/2016 4:16:44 PM By: Regan Lemming BSN, RN Entered By: Regan Lemming on 10/22/2016 10:58:56 [name] ([id number]) -------------------------------------------------------------------------------- Encounter Discharge Information Details Patient Name: Katherine [name]. Date of Service: 10/22/2016 11:00 AM Medical Record Number: [id number] Patient Account Number: [id number] Date of Birth/Sex: 1930/03/03 (83 y.o. Female) Treating RN: Baruch Gouty, RN, BSN, Velva Harman Primary Care Damian Hofstra: Thersa Salt Other Clinician: Referring Mailin Coglianese: Thersa Salt Treating Thomasine Klutts/Extender: Frann Rider in Treatment: 2 Encounter Discharge Information Items Discharge Pain Level: 0 Discharge Condition: Stable Ambulatory  Status: Wheelchair Discharge Destination: Home Transportation: Private Auto Accompanied By: niece Schedule Follow-up Appointment: No Medication Reconciliation completed No and provided to Patient/Care Airon Sahni: Provided on Clinical Summary of Care: 10/22/2016 Form Type Recipient Paper Patient DK Electronic Signature(s) Signed: 10/22/2016 11:38:15 AM By: Ruthine Dose Entered By: Ruthine Dose on 10/22/2016 11:38:15 Katherine [name]. ([id number]) -------------------------------------------------------------------------------- Lower Extremity Assessment Details Patient Name: Katherine [name]. Date of Service: 10/22/2016 11:00 AM Medical Record Number: [id number] Patient Account Number: [id number] Date of Birth/Sex: 08-Jan-1930 (83 y.o. Female) Treating RN: Afful, RN, BSN, Velva Harman Primary Care Moosa Bueche: Thersa Salt Other Clinician: Referring Kiani Wurtzel: Thersa Salt Treating Joahan Swatzell/Extender: Frann Rider in Treatment: 2 Edema Assessment Assessed: [Left: No] [Right: No] Edema: [Left: N] [Right: o] Vascular Assessment Pulses: Dorsalis Pedis Palpable: [Right:Yes] Posterior Tibial Extremity colors, hair growth, and conditions: Extremity Color: [Right:Mottled] Hair Growth on Extremity: [Right:No] Temperature of Extremity: [Right:Warm] Capillary Refill: [Right:< 3 seconds] Electronic Signature(s) Signed: 10/22/2016 4:16:44 PM By: Regan Lemming BSN, RN Entered By: Regan Lemming on 10/22/2016 11:01:33 Katherine Juba IMarland Kitchen ([id number]) -------------------------------------------------------------------------------- Multi Wound Chart Details Patient Name: Katherine [name]. Date of Service: 10/22/2016 11:00 AM Medical Record Number: [id number] Patient Account Number: [id number] Date of Birth/Sex: 1930-07-02 (83 y.o. Female) Treating RN: Baruch Gouty, RN, BSN, Velva Harman Primary Care Marisol Glazer: Thersa Salt Other Clinician: Referring Maren Wiesen: Thersa Salt Treating Kenson Groh/Extender: Frann Rider in Treatment:  2 Vital Signs Height(in): 61 Pulse(bpm): 53 Weight(lbs): 140 Blood Pressure 147/38 (mmHg): Body Mass Index(BMI): 26 Temperature(F): 97.8 Respiratory Rate 17 (breaths/min): Photos: [1:No Photos] [N/A:N/A] Wound Location: [1:Right Lower Leg - Lateral] [N/A:N/A] Wounding Event: [1:Surgical Injury] [N/A:N/A] Primary Etiology: [1:Open Surgical Wound] [N/A:N/A] Comorbid History: [1:Congestive Heart Failure, Hypertension, Myocardial Infarction, Osteoarthritis, Neuropathy] [N/A:N/A] Date Acquired: [1:08/24/2016] [N/A:N/A] Weeks of Treatment: [1:2] [N/A:N/A] Wound Status: [1:Open] [N/A:N/A] Measurements L x W x D 4.7x0.4x0.2 [N/A:N/A] (cm) Area (cm) : [1:1.477] [N/A:N/A] Volume (cm) : [1:0.295] [N/A:N/A] % Reduction in Area: [1:21.60%] [N/A:N/A] % Reduction in Volume: 60.90% [N/A:N/A] Classification: [1:Partial Thickness] [  N/A:N/A] Exudate Amount: [1:Small] [N/A:N/A] Exudate Type: [1:Serous] [N/A:N/A] Exudate Color: [1:amber] [N/A:N/A] Wound Margin: [1:Distinct, outline attached] [N/A:N/A] Granulation Amount: [1:None Present (0%)] [N/A:N/A] Necrotic Amount: [1:Large (67-100%)] [N/A:N/A] Necrotic Tissue: [1:Eschar, Adherent Slough] [N/A:N/A] Exposed Structures: [1:Fat Layer (Subcutaneous Tissue) Exposed: Yes] [N/A:N/A] Epithelialization: [1:Medium (34-66%)] [N/A:N/A] Debridement: [1:Debridement ([id number])] [N/A:N/A] Pre-procedure 11:22 N/A N/A Verification/Time Out Taken: Pain Control: Lidocaine 4% Topical N/A N/A Solution Tissue Debrided: Fibrin/Slough, N/A N/A Subcutaneous Level: Skin/Subcutaneous N/A N/A Tissue Debridement Area (sq 1.88 N/A N/A cm): Instrument: Curette N/A N/A Bleeding: Minimum N/A N/A Hemostasis Achieved: Pressure N/A N/A Procedural Pain: 0 N/A N/A Post Procedural Pain: 0 N/A N/A Debridement Treatment Procedure was tolerated N/A N/A Response: well Post Debridement 4.7x0.4x0.2 N/A N/A Measurements L x W x D (cm) Post Debridement 0.295 N/A  N/A Volume: (cm) Periwound Skin Texture: Excoriation: No N/A N/A Induration: No Callus: No Crepitus: No Rash: No Scarring: No Periwound Skin Maceration: No N/A N/A Moisture: Dry/Scaly: No Periwound Skin Color: Atrophie Blanche: No N/A N/A Cyanosis: No Ecchymosis: No Erythema: No Hemosiderin Staining: No Mottled: No Pallor: No Rubor: No Temperature: No Abnormality N/A N/A Tenderness on Yes N/A N/A Palpation: Wound Preparation: Ulcer Cleansing: N/A N/A Rinsed/Irrigated with Saline, Other: surg scrub and water Topical Anesthetic Applied: Other: lidocaine 4% Katherine [name], Katherine [name] ([id number]) Procedures Performed: Debridement N/A N/A Treatment Notes Electronic Signature(s) Signed: 10/22/2016 11:32:04 AM By: Christin Fudge MD, FACS Entered By: Christin Fudge on 10/22/2016 11:32:04 [name] ([id number]) -------------------------------------------------------------------------------- Alexander Details Patient Name: Katherine [name]. Date of Service: 10/22/2016 11:00 AM Medical Record Number: [id number] Patient Account Number: [id number] Date of Birth/Sex: 25-Sep-1929 (83 y.o. Female) Treating RN: Afful, RN, BSN, Velva Harman Primary Care Sully Dyment: Thersa Salt Other Clinician: Referring Sidonie Dexheimer: Thersa Salt Treating Silvino Selman/Extender: Frann Rider in Treatment: 2 Active Inactive ` Abuse / Safety / Falls / Self Care Management Nursing Diagnoses: Potential for falls Goals: Patient will remain injury free Date Initiated: 10/07/2016 Target Resolution Date: 12/18/2016 Goal Status: Active Interventions: Assess fall risk on admission and as needed Assess impairment of mobility on admission and as needed per policy Notes: ` Nutrition Nursing Diagnoses: Imbalanced nutrition Goals: Patient/caregiver agrees to and verbalizes understanding of need to use nutritional supplements and/or vitamins as prescribed Date Initiated: 10/07/2016 Target Resolution Date:  12/18/2016 Goal Status: Active Interventions: Assess patient nutrition upon admission and as needed per policy Notes: ` Orientation to the Wound Care Program Nursing Diagnoses: Katherine [name], Katherine [name] ([id number]) Knowledge deficit related to the wound healing center program Goals: Patient/caregiver will verbalize understanding of the [facility] Date Initiated: 10/07/2016 Target Resolution Date: 10/23/2016 Goal Status: Active Interventions: Provide education on orientation to the wound center Notes: ` Pain, Acute or Chronic Nursing Diagnoses: Pain, acute or chronic: actual or potential Potential alteration in comfort, pain Goals: Patient/caregiver will verbalize adequate pain control between visits Date Initiated: 10/07/2016 Target Resolution Date: 12/18/2016 Goal Status: Active Interventions: Assess comfort goal upon admission Complete pain assessment as per visit requirements Notes: ` Wound/Skin Impairment Nursing Diagnoses: Impaired tissue integrity Knowledge deficit related to smoking impact on wound healing Knowledge deficit related to ulceration/compromised skin integrity Goals: Ulcer/skin breakdown will have a volume reduction of 80% by week 12 Date Initiated: 10/07/2016 Target Resolution Date: 12/11/2016 Goal Status: Active Interventions: Assess patient/caregiver ability to perform ulcer/skin care regimen upon admission and as needed Assess ulceration(s) every visit Katherine [name], Katherine [name] ([id number]) Notes: Electronic Signature(s) Signed: 10/22/2016 4:16:44 PM By: Regan Lemming BSN, RN Entered By: Regan Lemming on  10/22/2016 11:12:54 Katherine [name], Katherine IMarland Kitchen ([id number]) -------------------------------------------------------------------------------- Pain Assessment Details Patient Name: Katherine [name], Katherine [name]. Date of Service: 10/22/2016 11:00 AM Medical Record Number: [id number] Patient Account Number: [id number] Date of Birth/Sex: Nov 06, 1929 (83 y.o. Female) Treating RN: Afful, RN, BSN,  Velva Harman Primary Care Isiaih Hollenbach: Thersa Salt Other Clinician: Referring Mariana Goytia: Thersa Salt Treating Rannie Craney/Extender: Frann Rider in Treatment: 2 Active Problems Location of Pain Severity and Description of Pain Patient Has Paino Yes Site Locations Pain Location: Pain in Ulcers Rate the pain. Current Pain Level: 10 Worst Pain Level: 10 Character of Pain Describe the Pain: Aching, Tender Pain Management and Medication Current Pain Management: Medication: Yes Rest: Yes How does your wound impact your activities of daily livingo Sleep: Yes Bathing: Yes Appetite: Yes Relationship With Others: Yes Bladder Continence: Yes Emotions: Yes Bowel Continence: Yes Work: Yes Toileting: Yes Drive: Yes Dressing: Yes Hobbies: Yes Electronic Signature(s) Signed: 10/22/2016 4:16:44 PM By: Regan Lemming BSN, RN Entered By: Regan Lemming on 10/22/2016 10:59:20 [name] ([id number]) -------------------------------------------------------------------------------- Patient/Caregiver Education Details Patient Name: Katherine [name]. Date of Service: 10/22/2016 11:00 AM Medical Record Number: [id number] Patient Account Number: [id number] Date of Birth/Gender: 1930-03-04 (83 y.o. Female) Treating RN: Baruch Gouty, RN, BSN, Velva Harman Primary Care Physician: Thersa Salt Other Clinician: Referring Physician: Thersa Salt Treating Physician/Extender: Frann Rider in Treatment: 2 Education Assessment Education Provided To: Patient Education Topics Provided Welcome To The [facility]: Methods: Explain/Verbal Responses: State content correctly Wound Debridement: Methods: Explain/Verbal Responses: State content correctly Wound/Skin Impairment: Methods: Explain/Verbal Responses: State content correctly Electronic Signature(s) Signed: 10/22/2016 4:16:44 PM By: Regan Lemming BSN, RN Entered By: Regan Lemming on 10/22/2016 11:37:10 Katherine Juba IMarland Kitchen  ([id number]) -------------------------------------------------------------------------------- Wound Assessment Details Patient Name: Katherine [name]. Date of Service: 10/22/2016 11:00 AM Medical Record Number: [id number] Patient Account Number: [id number] Date of Birth/Sex: 1930-08-08 (83 y.o. Female) Treating RN: Afful, RN, BSN, [place] Primary Care Abrianna Sidman: Thersa Salt Other Clinician: Referring Jadakiss Barish: Thersa Salt Treating Taressa Rauh/Extender: Frann Rider in Treatment: 2 Wound Status Wound Number: 1 Primary Open Surgical Wound Etiology: Wound Location: Right Lower Leg - Lateral Wound Open Wounding Event: Surgical Injury Status: Date Acquired: 08/24/2016 Comorbid Congestive Heart Failure, Weeks Of Treatment: 2 History: Hypertension, Myocardial Infarction, Clustered Wound: No Osteoarthritis, Neuropathy Photos Photo Uploaded By: Regan Lemming on 10/22/2016 14:03:34 Wound Measurements Length: (cm) 4.7 Width: (cm) 0.4 Depth: (cm) 0.2 Area: (cm) 1.477 Volume: (cm) 0.295 % Reduction in Area: 21.6% % Reduction in Volume: 60.9% Epithelialization: Medium (34-66%) Tunneling: No Undermining: No Wound Description Classification: Partial Thickness Foul Odor Aft Wound Margin: Distinct, outline attached Slough/Fibrin Exudate Amount: Small Exudate Type: Serous Exudate Color: amber er Cleansing: No o Yes Wound Bed Granulation Amount: None Present (0%) Exposed Structure Necrotic Amount: Large (67-100%) Fat Layer (Subcutaneous Tissue) Exposed: Yes Necrotic Quality: Eschar, Adherent Slough Katherine [name], Katherine [name]. ([id number]) Periwound Skin Texture Texture Color No Abnormalities Noted: No No Abnormalities Noted: No Callus: No Atrophie Blanche: No Crepitus: No Cyanosis: No Excoriation: No Ecchymosis: No Induration: No Erythema: No Rash: No Hemosiderin Staining: No Scarring: No Mottled: No Pallor: No Moisture Rubor: No No Abnormalities Noted: No Dry / Scaly: No Temperature /  Pain Maceration: No Temperature: No Abnormality Tenderness on Palpation: Yes Wound Preparation Ulcer Cleansing: Rinsed/Irrigated with Saline, Other: surg scrub and water, Topical Anesthetic Applied: Other: lidocaine 4%, Treatment Notes Wound #1 (Right, Lateral Lower Leg) 1. Cleansed with: Cleanse wound with antibacterial soap and water 3. Peri-wound Care: Moisturizing lotion 4. Dressing Applied: Aquacel Ag 5. Secondary Dressing  Applied ABD Pad 7. Secured with 3 Layer Compression System - Right Lower Extremity Notes unna to anchor Electronic Signature(s) Signed: 10/22/2016 4:16:44 PM By: Regan Lemming BSN, RN Entered By: Regan Lemming on 10/22/2016 11:08:57 Katherine Juba IMarland Kitchen ([id number]) -------------------------------------------------------------------------------- Vitals Details Patient Name: Katherine [name]. Date of Service: 10/22/2016 11:00 AM Medical Record Number: [id number] Patient Account Number: [id number] Date of Birth/Sex: 25-Feb-1930 (83 y.o. Female) Treating RN: Afful, RN, BSN, [place] Primary Care Marsela Kuan: Thersa Salt Other Clinician: Referring Gicela Schwarting: Thersa Salt Treating Trystian Crisanto/Extender: Frann Rider in Treatment: 2 Vital Signs Time Taken: 11:00 Temperature (F): 97.8 Height (in): 61 Pulse (bpm): 53 Weight (lbs): 140 Respiratory Rate (breaths/min): 17 Body Mass Index (BMI): 26.4 Blood Pressure (mmHg): 147/38 Reference Range: 80 - 120 mg / dl Electronic Signature(s) Signed: 10/22/2016 4:16:44 PM By: Regan Lemming BSN, RN Entered By: Regan Lemming on 10/22/2016 11:02:08

## 2016-10-25 DIAGNOSIS — S82841D Displaced bimalleolar fracture of right lower leg, subsequent encounter for closed fracture with routine healing: Secondary | ICD-10-CM | POA: Diagnosis not present

## 2016-10-26 DIAGNOSIS — S82851D Displaced trimalleolar fracture of right lower leg, subsequent encounter for closed fracture with routine healing: Secondary | ICD-10-CM | POA: Diagnosis not present

## 2016-10-27 ENCOUNTER — Emergency Department: Payer: Medicare HMO

## 2016-10-27 ENCOUNTER — Encounter: Payer: Self-pay | Admitting: Emergency Medicine

## 2016-10-27 ENCOUNTER — Emergency Department
Admission: EM | Admit: 2016-10-27 | Discharge: 2016-10-27 | Disposition: A | Discharge status: Home / Self Care | Payer: Medicare HMO | Attending: Student in an Organized Health Care Education/Training Program | Admitting: Student in an Organized Health Care Education/Training Program

## 2016-10-27 DIAGNOSIS — A09 Infectious gastroenteritis and colitis, unspecified: Secondary | ICD-10-CM

## 2016-10-27 DIAGNOSIS — Z7901 Long term (current) use of anticoagulants: Secondary | ICD-10-CM | POA: Insufficient documentation

## 2016-10-27 DIAGNOSIS — I2510 Atherosclerotic heart disease of native coronary artery without angina pectoris: Secondary | ICD-10-CM | POA: Diagnosis not present

## 2016-10-27 DIAGNOSIS — Z7982 Long term (current) use of aspirin: Secondary | ICD-10-CM | POA: Insufficient documentation

## 2016-10-27 DIAGNOSIS — I110 Hypertensive heart disease with heart failure: Secondary | ICD-10-CM | POA: Insufficient documentation

## 2016-10-27 DIAGNOSIS — A0472 Enterocolitis due to Clostridium difficile, not specified as recurrent: Secondary | ICD-10-CM

## 2016-10-27 DIAGNOSIS — Z79899 Other long term (current) drug therapy: Secondary | ICD-10-CM | POA: Diagnosis not present

## 2016-10-27 DIAGNOSIS — J449 Chronic obstructive pulmonary disease, unspecified: Secondary | ICD-10-CM | POA: Diagnosis not present

## 2016-10-27 DIAGNOSIS — R1084 Generalized abdominal pain: Secondary | ICD-10-CM | POA: Diagnosis present

## 2016-10-27 DIAGNOSIS — I5042 Chronic combined systolic (congestive) and diastolic (congestive) heart failure: Secondary | ICD-10-CM | POA: Insufficient documentation

## 2016-10-27 DIAGNOSIS — R109 Unspecified abdominal pain: Secondary | ICD-10-CM | POA: Diagnosis not present

## 2016-10-27 LAB — COMPREHENSIVE METABOLIC PANEL
ALT: 24 U/L (ref 14–54)
AST: 36 U/L (ref 15–41)
Albumin: 3.3 g/dL — ABNORMAL LOW (ref 3.5–5.0)
Alkaline Phosphatase: 66 U/L (ref 38–126)
Anion gap: 7 (ref 5–15)
BUN: 16 mg/dL (ref 6–20)
CHLORIDE: 104 mmol/L (ref 101–111)
CO2: 29 mmol/L (ref 22–32)
Calcium: 8.9 mg/dL (ref 8.9–10.3)
Creatinine, Ser: 0.57 mg/dL (ref 0.44–1.00)
Glucose, Bld: 111 mg/dL — ABNORMAL HIGH (ref 65–99)
POTASSIUM: 4.7 mmol/L (ref 3.5–5.1)
Sodium: 140 mmol/L (ref 135–145)
Total Bilirubin: 0.5 mg/dL (ref 0.3–1.2)
Total Protein: 6.4 g/dL — ABNORMAL LOW (ref 6.5–8.1)

## 2016-10-27 LAB — GASTROINTESTINAL PANEL BY PCR, STOOL (REPLACES STOOL CULTURE)
ADENOVIRUS F40/41: NOT DETECTED
ASTROVIRUS: NOT DETECTED
CRYPTOSPORIDIUM: NOT DETECTED
CYCLOSPORA CAYETANENSIS: NOT DETECTED
Campylobacter species: NOT DETECTED
ENTAMOEBA HISTOLYTICA: NOT DETECTED
ENTEROPATHOGENIC E COLI (EPEC): NOT DETECTED
Enteroaggregative E coli (EAEC): NOT DETECTED
Enterotoxigenic E coli (ETEC): NOT DETECTED
GIARDIA LAMBLIA: NOT DETECTED
NOROVIRUS GI/GII: NOT DETECTED
Plesimonas shigelloides: NOT DETECTED
ROTAVIRUS A: NOT DETECTED
SALMONELLA SPECIES: NOT DETECTED
Sapovirus (I, II, IV, and V): NOT DETECTED
Shiga like toxin producing E coli (STEC): NOT DETECTED
Shigella/Enteroinvasive E coli (EIEC): NOT DETECTED
VIBRIO CHOLERAE: NOT DETECTED
Vibrio species: NOT DETECTED
Yersinia enterocolitica: NOT DETECTED

## 2016-10-27 LAB — URINALYSIS, COMPLETE (UACMP) WITH MICROSCOPIC
BACTERIA UA: NONE SEEN
Bilirubin Urine: NEGATIVE
GLUCOSE, UA: NEGATIVE mg/dL
Hgb urine dipstick: NEGATIVE
Ketones, ur: NEGATIVE mg/dL
LEUKOCYTES UA: NEGATIVE
Nitrite: NEGATIVE
PH: 6 (ref 5.0–8.0)
Protein, ur: NEGATIVE mg/dL
RBC / HPF: NONE SEEN RBC/hpf (ref 0–5)
SPECIFIC GRAVITY, URINE: 1.024 (ref 1.005–1.030)

## 2016-10-27 LAB — CBC
HCT: 35.8 % (ref 35.0–47.0)
Hemoglobin: 11.9 g/dL — ABNORMAL LOW (ref 12.0–16.0)
MCH: 30.7 pg (ref 26.0–34.0)
MCHC: 33.1 g/dL (ref 32.0–36.0)
MCV: 92.7 fL (ref 80.0–100.0)
Platelets: 160 10*3/uL (ref 150–440)
RBC: 3.86 MIL/uL (ref 3.80–5.20)
RDW: 15 % — ABNORMAL HIGH (ref 11.5–14.5)
WBC: 7.9 10*3/uL (ref 3.6–11.0)

## 2016-10-27 LAB — C DIFFICILE QUICK SCREEN W PCR REFLEX
C DIFFICILE (CDIFF) INTERP: DETECTED
C Diff antigen: POSITIVE — AB
C Diff toxin: POSITIVE — AB

## 2016-10-27 LAB — MAGNESIUM: MAGNESIUM: 1.8 mg/dL (ref 1.7–2.4)

## 2016-10-27 LAB — LIPASE, BLOOD

## 2016-10-27 MED ORDER — CULTURELLE DIGESTIVE HEALTH PO CAPS: 1.0000 | ORAL_CAPSULE | Freq: Every morning | ORAL | 0 refills | Status: AC

## 2016-10-27 MED ORDER — POTASSIUM CHLORIDE CRYS ER 20 MEQ PO TBCR
40.0000 meq | EXTENDED_RELEASE_TABLET | Freq: Once | ORAL | Status: AC
  Administered 2016-10-27: 40 meq via ORAL
  Filled 2016-10-27: qty 2

## 2016-10-27 MED ORDER — VANCOMYCIN 50 MG/ML ORAL SOLUTION
125.0000 mg | Freq: Four times a day (QID) | ORAL | Status: DC
  Administered 2016-10-27: 125 mg via ORAL
  Filled 2016-10-27 (×3): qty 2.5

## 2016-10-27 MED ORDER — IOPAMIDOL (ISOVUE-300) INJECTION 61%
100.0000 mL | Freq: Once | INTRAVENOUS | Status: AC | PRN
  Administered 2016-10-27: 100 mL via INTRAVENOUS

## 2016-10-27 MED ORDER — ONDANSETRON HCL 4 MG PO TABS: 4.0000 mg | ORAL_TABLET | Freq: Every day | ORAL | 0 refills | Status: DC | PRN

## 2016-10-27 MED ORDER — VANCOMYCIN HCL 125 MG PO CAPS: 125.0000 mg | ORAL_CAPSULE | Freq: Four times a day (QID) | ORAL | 0 refills | Status: AC

## 2016-10-27 MED ORDER — ONDANSETRON 4 MG PO TBDP
4.0000 mg | ORAL_TABLET | Freq: Once | ORAL | Status: AC | PRN
  Administered 2016-10-27: 4 mg via ORAL
  Filled 2016-10-27: qty 1

## 2016-10-27 NOTE — ED Triage Notes (Signed)
Pt presents to ED via POV c/o generalized 6/10 abdominal pain, worse when having BM. Pt recently completed course of bactrim and keflex for infection following ankle surgery. Pt states x1 BM Q2H with mucous. C/o nausea but denies emesis.

## 2016-10-27 NOTE — ED Notes (Signed)
Pt aware of need for urine and stool specimen.

## 2016-10-27 NOTE — ED Notes (Signed)
Assisted to toilet. Stool samples sent. NAD. Family remains in room.

## 2016-10-27 NOTE — Discharge Instructions (Signed)

## 2016-10-27 NOTE — ED Provider Notes (Signed)
[facility] Emergency Department Provider Note    First MD Initiated Contact with Patient 10/27/16 1543     (approximate)  I have reviewed the triage vital signs and the nursing notes.   HISTORY  Chief Complaint Abdominal Pain and Nausea    HPI Katherine [name] is a 83 y.o. female multiple chronic medical issues presents with several days of very frequent foul-smelling liquid stools. States she's been having crampy abdominal pain roughly 6 out of 10 in severity that is worse prior to moving her bowel movement. Says that the bowel movements are occurring roughly every 2 hours. Denies any blood in her stool. Has nausea but no vomiting. Denies any chest pain or shortness of breath. Was recently put on antibiotics for a chronic right foot wound infection.   Past Medical History:  Diagnosis Date  . Arthritis   . Chicken pox   . Chronic combined systolic and diastolic heart failure ([place])    a. 10/2015 Echo: EF 35-40%, Akinesis of mid-apicalanteroseptal wall & AK of apex, Gr 1DD.  Marland Kitchen COPD (chronic obstructive pulmonary disease) ([place])    worked in a [employer]  . GERD (gastroesophageal reflux disease)   . Hyperlipidemia   . Hypertensive heart disease   . Ischemic cardiomyopathy    a. 10/2015 Echo: EF 35-40%, Akinesis of mid-apicalanteroseptal wall & AK of apex, Gr 1DD.  Marland Kitchen Occlusive coronary artery disease requiring drug therapy    a. Remote MI - never sought treatment when it occurred, shows up old on EKG;  b. Cath 10/2015 - 100% pLAD CTO with 80% D1, prox Cx 60% (FFR 0.92-->med Rx) - EF ~35%   Family History  Problem Relation Age of Onset  . Heart disease Mother   . Stroke Mother   . Hypertension Mother   . Lung cancer Mother   . Hyperlipidemia Mother   . CAD Sister   . Heart disease Sister   . Stroke Sister   . Hypertension Sister   . Arthritis Sister   . CAD Brother   . Heart disease Brother   . Hypertension Brother   . Heart disease Sister   . Stroke  Sister   . Hypertension Sister   . Arthritis Sister   . Heart disease Sister   . Stroke Sister   . Hypertension Sister   . Breast cancer Sister   . Heart disease Sister   . Stroke Sister   . Hypertension Sister   . Heart disease Brother   . Hypertension Brother   . Heart disease Brother   . Hypertension Brother   . Heart disease Brother   . Hypertension Brother   . Heart disease Brother   . Hypertension Brother   . Heart disease Brother   . Hypertension Brother    Past Surgical History:  Procedure Laterality Date  . ABDOMINAL HYSTERECTOMY  1985  . AMPUTATION FINGER Left 1966   ring finger  . APPENDECTOMY  1961  . BACK SURGERY  2010   lower back  . CARDIAC CATHETERIZATION N/A 10/30/2015   Procedure: Right/Left Heart Cath and Coronary Angiography;  Surgeon: Wellington Hampshire, MD;  Location: [place] [facility];  Service: Cardiovascular;  Laterality: N/A;  . CARDIAC CATHETERIZATION N/A 10/30/2015   Procedure: Intravascular Pressure Wire/FFR Study;  Surgeon: Wellington Hampshire, MD;  Location: [place] [facility];  Service: Cardiovascular;  Laterality: N/A;  . ORIF ANKLE FRACTURE Right 08/24/2016   Procedure: OPEN REDUCTION INTERNAL FIXATION (ORIF) ANKLE FRACTURE;  Surgeon: Thornton Park, MD;  Location: [facility];  Service: Orthopedics;  Laterality: Right;  . SHOULDER SURGERY    . TONSILLECTOMY  1953  . TOTAL HIP ARTHROPLASTY Right 2012   Patient Active Problem List   Diagnosis Date Noted  . Ankle fracture 08/23/2016  . Frequent falls 03/25/2016  . Cough 03/12/2016  . Chronic low back pain 01/08/2016  . Essential hypertension 12/02/2015  . Chronic combined systolic and diastolic heart failure ([place])   . Occlusive coronary artery disease requiring drug therapy   . Hyperlipidemia 11/02/2015  . Cardiomyopathy, ischemic 11/02/2015  . PAH (pulmonary artery hypertension) 11/02/2015  . NSTEMI (non-ST elevated myocardial infarction) ([place]) - 10/30/2015 10/30/2015  . COPD (chronic  obstructive pulmonary disease) ([place])       Prior to Admission medications   Medication Sig Start Date End Date Taking? Authorizing Provider  aspirin EC 81 MG tablet Take 81 mg by mouth daily.    Historical Provider, MD  atorvastatin (LIPITOR) 80 MG tablet Take 1 tablet (80 mg total) by mouth daily at 6 PM. 11/14/15   Imogene Burn, PA-C  bisacodyl (DULCOLAX) 10 MG suppository Place 1 suppository (10 mg total) rectally daily as needed for moderate constipation. 08/26/16   Vaughan Basta, MD  carvedilol (COREG) 6.25 MG tablet Take 1 tablet (6.25 mg total) by mouth 2 (two) times daily with a meal. 02/25/16   Coral Spikes, DO  docusate sodium (COLACE) 100 MG capsule Take 1 capsule (100 mg total) by mouth 2 (two) times daily. 08/26/16   Vaughan Basta, MD  enoxaparin (LOVENOX) 40 MG/0.4ML injection Inject 0.4 mLs (40 mg total) into the skin daily. 08/26/16 09/04/16  Vaughan Basta, MD  ferrous sulfate 325 (65 FE) MG tablet Take 1 tablet (325 mg total) by mouth 3 (three) times daily after meals. 08/26/16   Vaughan Basta, MD  furosemide (LASIX) 20 MG tablet Take 1 tablet (20 mg total) by mouth every other day. 12/18/15   Wellington Hampshire, MD  HYDROcodone-acetaminophen (NORCO/VICODIN) 5-325 MG tablet Take 1 tablet by mouth every 4 (four) hours as needed for moderate pain or severe pain. 08/26/16   Vaughan Basta, MD  multivitamin-iron-minerals-folic acid (CENTRUM) chewable tablet Chew 1 tablet by mouth daily.    Historical Provider, MD  nitroGLYCERIN (NITROSTAT) 0.4 MG SL tablet Place 1 tablet (0.4 mg total) under the tongue every 5 (five) minutes as needed for chest pain. 11/14/15   Imogene Burn, PA-C  omega-3 acid ethyl esters (LOVAZA) 1 g capsule Take 1 g by mouth daily.    Historical Provider, MD  pantoprazole (PROTONIX) 40 MG tablet Take 1 tablet (40 mg total) by mouth daily. 05/12/16   Coral Spikes, DO  polyethylene glycol (MIRALAX / GLYCOLAX) packet Take 17 g by mouth  daily as needed for mild constipation. 08/26/16   Vaughan Basta, MD  pregabalin (LYRICA) 75 MG capsule Take 1 capsule (75 mg total) by mouth 2 (two) times daily. 07/29/16   Coral Spikes, DO  sertraline (ZOLOFT) 100 MG tablet Take 1 tablet (100 mg total) by mouth daily. 02/25/16   Coral Spikes, DO  thiamine (VITAMIN B-1) 100 MG tablet Take 1 tablet (100 mg total) by mouth daily. 10/18/16   Coral Spikes, DO  Vitamin D, Ergocalciferol, (DRISDOL) 50000 units CAPS capsule Take 50,000 Units by mouth every 7 (seven) days.    Historical Provider, MD    Allergies Patient has no known allergies.    Social History Social History  Substance Use Topics  . Smoking status: Never Smoker  . Smokeless tobacco: Never Used  . Alcohol use No    Review of Systems Patient denies headaches, rhinorrhea, blurry vision, numbness, shortness of breath, chest pain, edema, cough, abdominal pain, nausea, vomiting, diarrhea, dysuria, fevers, rashes or hallucinations unless otherwise stated above in HPI. ____________________________________________   PHYSICAL EXAM:  VITAL SIGNS: Vitals:   10/27/16 1226  BP: 117/88  Pulse: (!) 55  Resp: 18  Temp: 97.6 F (36.4 C)    Constitutional: Alert and oriented. Well appearing and in no acute distress. Eyes: Conjunctivae are normal. PERRL. EOMI. Head: Atraumatic. Nose: No congestion/rhinnorhea. Mouth/Throat: Mucous membranes are moist.  Oropharynx non-erythematous. Neck: No stridor. Painless ROM. No cervical spine tenderness to palpation Hematological/Lymphatic/Immunilogical: No cervical lymphadenopathy. Cardiovascular: Normal rate, regular rhythm. Grossly normal heart sounds.  Good peripheral circulation. Respiratory: Normal respiratory effort.  No retractions. Lungs CTAB. Gastrointestinal: Soft and nontender, mild llq ttp, no rebound or guarding. No distention. No abdominal bruits. No CVA tenderness. Musculoskeletal: No lower extremity tenderness nor edema.   No joint effusions. Neurologic:  Normal speech and language. No gross focal neurologic deficits are appreciated. No gait instability. Skin:  Skin is warm, dry and intact. No rash noted. Psychiatric: Mood and affect are normal. Speech and behavior are normal.  ____________________________________________   LABS (all labs ordered are listed, but only abnormal results are displayed)  Results for orders placed or performed during the hospital encounter of 10/27/16 (from the past 24 hour(s))  Lipase, blood     Status: Abnormal   Collection Time: 10/27/16 12:34 PM  Result Value Ref Range   Lipase <10 (L) 11 - 51 U/L  Comprehensive metabolic panel     Status: Abnormal   Collection Time: 10/27/16 12:34 PM  Result Value Ref Range   Sodium 140 135 - 145 mmol/L   Potassium 4.7 3.5 - 5.1 mmol/L   Chloride 104 101 - 111 mmol/L   CO2 29 22 - 32 mmol/L   Glucose, Bld 111 (H) 65 - 99 mg/dL   BUN 16 6 - 20 mg/dL   Creatinine, Ser 0.57 0.44 - 1.00 mg/dL   Calcium 8.9 8.9 - 10.3 mg/dL   Total Protein 6.4 (L) 6.5 - 8.1 g/dL   Albumin 3.3 (L) 3.5 - 5.0 g/dL   AST 36 15 - 41 U/L   ALT 24 14 - 54 U/L   Alkaline Phosphatase 66 38 - 126 U/L   Total Bilirubin 0.5 0.3 - 1.2 mg/dL   GFR calc non Af Amer >60 >60 mL/min   GFR calc Af Amer >60 >60 mL/min   Anion gap 7 5 - 15  CBC     Status: Abnormal   Collection Time: 10/27/16 12:34 PM  Result Value Ref Range   WBC 7.9 3.6 - 11.0 K/uL   RBC 3.86 3.80 - 5.20 MIL/uL   Hemoglobin 11.9 (L) 12.0 - 16.0 g/dL   HCT 35.8 35.0 - 47.0 %   MCV 92.7 80.0 - 100.0 fL   MCH 30.7 26.0 - 34.0 pg   MCHC 33.1 32.0 - 36.0 g/dL   RDW 15.0 (H) 11.5 - 14.5 %   Platelets 160 150 - 440 K/uL   ____________________________________________  EKG My review and personal interpretation at Time: 14:36   Indication: dizziness  Rate: 80  Rhythm: sinus Axis: left Other: rbbb, no STEMI ____________________________________________  RADIOLOGY  I personally reviewed all  radiographic images ordered to evaluate for the above acute complaints and  reviewed radiology reports and findings.  These findings were personally discussed with the patient.  Please see medical record for radiology report.  ____________________________________________   PROCEDURES  Procedure(s) performed:  Procedures    Critical Care performed: no ____________________________________________   INITIAL IMPRESSION / ASSESSMENT AND PLAN / ED COURSE  Pertinent labs & imaging results that were available during my care of the patient were reviewed by me and considered in my medical decision making (see chart for details).  DDX: colitis, sbo, diverticulitis, enteritis  NGA RABON is a 83 y.o. who presents to the ED with  diarrhea and nausea as described above patient with recent antibiotic course therefore I am concerned for C. difficile colitis. Otherwise well-perfused and in no acute distress. Will order CT imaging to evaluate for evidence of diverticulitis or abscess. We will check blood work.  The patient will be placed on continuous pulse oximetry and telemetry for monitoring.  Laboratory evaluation will be sent to evaluate for the above complaints.     Clinical Course as of Oct 27 2048  Wed Oct 27, 2016  1653 CT with evidence of colitis.  Awaiting c-dif study.  [PR]  1913 Patient updated on results.  She is currently requesting something to eat.  Remains hemodynamically stable.  Will rtial dose of PO vanc to see if she can tolerate medication as she is requesting discharge home.    [PR]  1918 CO2: 29 [PR]  2009 Patient tolerated PO vanc without distress.  She has non-severe c-dif based on criteria.  Patient was able to tolerate PO and was able to ambulate with a steady gait.  Have discussed with the patient and available family all diagnostics and treatments performed thus far and all questions were answered to the best of my ability. The patient demonstrates understanding and  agreement with plan.   [PR]    Clinical Course User Index [PR] Merlyn Lot, MD     ____________________________________________   FINAL CLINICAL IMPRESSION(S) / ED DIAGNOSES  Final diagnoses:  C. difficile colitis  Diarrhea of infectious origin      NEW MEDICATIONS STARTED DURING THIS VISIT:  New Prescriptions   No medications on file     Note:  This document was prepared using Dragon voice recognition software and may include unintentional dictation errors.    Merlyn Lot, MD 10/27/16 2051

## 2016-10-27 NOTE — ED Notes (Signed)
Critical from kate in lab received by Virgilio Belling RN, @ [phone], C-Diff positive both antigen and toxin, Dr.Robinson notified , no orders received

## 2016-10-27 NOTE — ED Notes (Signed)
Pt discharged to home.  Family member driving.  Discharge instructions reviewed.  Verbalized understanding.  No questions or concerns at this time.  Teach back verified.  Pt in NAD.  No items left in ED.   

## 2016-10-28 ENCOUNTER — Ambulatory Visit: Payer: Medicare HMO | Admitting: Family Medicine

## 2016-10-28 ENCOUNTER — Encounter: Payer: Self-pay | Admitting: Emergency Medicine

## 2016-10-28 ENCOUNTER — Emergency Department
Admission: EM | Admit: 2016-10-28 | Discharge: 2016-10-28 | Disposition: A | Discharge status: Home / Self Care | Payer: Medicare HMO | Attending: Emergency Medicine | Admitting: Emergency Medicine

## 2016-10-28 DIAGNOSIS — R5383 Other fatigue: Secondary | ICD-10-CM

## 2016-10-28 DIAGNOSIS — J449 Chronic obstructive pulmonary disease, unspecified: Secondary | ICD-10-CM | POA: Diagnosis not present

## 2016-10-28 DIAGNOSIS — Z7982 Long term (current) use of aspirin: Secondary | ICD-10-CM | POA: Diagnosis not present

## 2016-10-28 DIAGNOSIS — I5042 Chronic combined systolic (congestive) and diastolic (congestive) heart failure: Secondary | ICD-10-CM | POA: Diagnosis not present

## 2016-10-28 DIAGNOSIS — I110 Hypertensive heart disease with heart failure: Secondary | ICD-10-CM | POA: Diagnosis not present

## 2016-10-28 DIAGNOSIS — A0472 Enterocolitis due to Clostridium difficile, not specified as recurrent: Secondary | ICD-10-CM

## 2016-10-28 DIAGNOSIS — Z79899 Other long term (current) drug therapy: Secondary | ICD-10-CM | POA: Diagnosis not present

## 2016-10-28 DIAGNOSIS — R404 Transient alteration of awareness: Secondary | ICD-10-CM | POA: Diagnosis not present

## 2016-10-28 DIAGNOSIS — R531 Weakness: Secondary | ICD-10-CM | POA: Diagnosis not present

## 2016-10-28 LAB — CBC WITH DIFFERENTIAL/PLATELET
BASOS PCT: 0 %
Basophils Absolute: 0 10*3/uL (ref 0–0.1)
EOS ABS: 0.1 10*3/uL (ref 0–0.7)
EOS PCT: 1 %
HCT: 36.8 % (ref 35.0–47.0)
Hemoglobin: 12.2 g/dL (ref 12.0–16.0)
Lymphocytes Relative: 11 %
Lymphs Abs: 1.4 10*3/uL (ref 1.0–3.6)
MCH: 30.4 pg (ref 26.0–34.0)
MCHC: 33.2 g/dL (ref 32.0–36.0)
MCV: 91.7 fL (ref 80.0–100.0)
MONO ABS: 1.6 10*3/uL — AB (ref 0.2–0.9)
MONOS PCT: 14 %
NEUTROS ABS: 8.9 10*3/uL — AB (ref 1.4–6.5)
Neutrophils Relative %: 74 %
PLATELETS: 163 10*3/uL (ref 150–440)
RBC: 4.01 MIL/uL (ref 3.80–5.20)
RDW: 15.4 % — AB (ref 11.5–14.5)
WBC: 12 10*3/uL — ABNORMAL HIGH (ref 3.6–11.0)

## 2016-10-28 LAB — URINALYSIS, COMPLETE (UACMP) WITH MICROSCOPIC
BILIRUBIN URINE: NEGATIVE
Bacteria, UA: NONE SEEN
Glucose, UA: NEGATIVE mg/dL
Ketones, ur: NEGATIVE mg/dL
Leukocytes, UA: NEGATIVE
NITRITE: NEGATIVE
PH: 7 (ref 5.0–8.0)
Protein, ur: NEGATIVE mg/dL
SPECIFIC GRAVITY, URINE: 1.015 (ref 1.005–1.030)
Squamous Epithelial / LPF: NONE SEEN

## 2016-10-28 LAB — COMPREHENSIVE METABOLIC PANEL
ALBUMIN: 3.2 g/dL — AB (ref 3.5–5.0)
ALK PHOS: 61 U/L (ref 38–126)
ALT: 25 U/L (ref 14–54)
ANION GAP: 6 (ref 5–15)
AST: 46 U/L — ABNORMAL HIGH (ref 15–41)
BILIRUBIN TOTAL: 0.7 mg/dL (ref 0.3–1.2)
BUN: 11 mg/dL (ref 6–20)
CALCIUM: 8.5 mg/dL — AB (ref 8.9–10.3)
CO2: 28 mmol/L (ref 22–32)
CREATININE: 0.82 mg/dL (ref 0.44–1.00)
Chloride: 104 mmol/L (ref 101–111)
GFR calc Af Amer: >60 mL/min (ref >60)
GFR calc non Af Amer: >60 mL/min (ref >60)
GLUCOSE: 117 mg/dL — AB (ref 65–99)
Potassium: 3.9 mmol/L (ref 3.5–5.1)
Sodium: 138 mmol/L (ref 135–145)
TOTAL PROTEIN: 6.2 g/dL — AB (ref 6.5–8.1)

## 2016-10-28 LAB — LIPASE, BLOOD

## 2016-10-28 NOTE — ED Provider Notes (Signed)
[facility] Emergency Department Provider Note  ____________________________________________  Time seen: Approximately 10:33 AM  I have reviewed the triage vital signs and the nursing notes.   HISTORY  Chief Complaint Weakness    HPI Katherine [name] is a 83 y.o. female who complains of generalized weakness and falls at home. She denies unsteadiness on her feet, and denies Foley falling over from standing. She reports that she is staying with her sister who has dementia for the last 5 years so that she can help her. The sister's floor is very slippery and so when the patient is not wearing shoes and walking and socks she tends to slip. Denies head injury or loss of consciousness. Denies any pain anywhere except some very mild abdominal pain which is nonfocal. No pain radiation. No vomiting. He was told she had C. difficile colitis and started on antibiotics which she started last night. She's been drinking fluids as well. Mainly she returns to the emergency department because she started antibiotics last night and is not feeling better yet    Past Medical History:  Diagnosis Date  . Arthritis   . CHF (congestive heart failure) ([place])   . Chicken pox   . Chronic combined systolic and diastolic heart failure ([place])    a. 10/2015 Echo: EF 35-40%, Akinesis of mid-apicalanteroseptal wall & AK of apex, Gr 1DD.  Marland Kitchen COPD (chronic obstructive pulmonary disease) ([place])    worked in a [place]  . GERD (gastroesophageal reflux disease)   . Hyperlipidemia   . Hypertension   . Hypertensive heart disease   . Ischemic cardiomyopathy    a. 10/2015 Echo: EF 35-40%, Akinesis of mid-apicalanteroseptal wall & AK of apex, Gr 1DD.  Marland Kitchen Occlusive coronary artery disease requiring drug therapy    a. Remote MI - never sought treatment when it occurred, shows up old on EKG;  b. Cath 10/2015 - 100% pLAD CTO with 80% D1, prox Cx 60% (FFR 0.92-->med Rx) - EF ~35%     Patient Active Problem List    Diagnosis Date Noted  . Ankle fracture 08/23/2016  . Frequent falls 03/25/2016  . Cough 03/12/2016  . Chronic low back pain 01/08/2016  . Essential hypertension 12/02/2015  . Chronic combined systolic and diastolic heart failure ([place])   . Occlusive coronary artery disease requiring drug therapy   . Hyperlipidemia 11/02/2015  . Cardiomyopathy, ischemic 11/02/2015  . PAH (pulmonary artery hypertension) 11/02/2015  . NSTEMI (non-ST elevated myocardial infarction) ([place]) - 10/30/2015 10/30/2015  . COPD (chronic obstructive pulmonary disease) ([place])      Past Surgical History:  Procedure Laterality Date  . ABDOMINAL HYSTERECTOMY  1985  . AMPUTATION FINGER Left 1966   ring finger  . APPENDECTOMY  1961  . BACK SURGERY  2010   lower back  . CARDIAC CATHETERIZATION N/A 10/30/2015   Procedure: Right/Left Heart Cath and Coronary Angiography;  Surgeon: Wellington Hampshire, MD;  Location: [place] [facility];  Service: Cardiovascular;  Laterality: N/A;  . CARDIAC CATHETERIZATION N/A 10/30/2015   Procedure: Intravascular Pressure Wire/FFR Study;  Surgeon: Wellington Hampshire, MD;  Location: [place] [facility];  Service: Cardiovascular;  Laterality: N/A;  . ORIF ANKLE FRACTURE Right 08/24/2016   Procedure: OPEN REDUCTION INTERNAL FIXATION (ORIF) ANKLE FRACTURE;  Surgeon: Thornton Park, MD;  Location: [facility] ORS;  Service: Orthopedics;  Laterality: Right;  . SHOULDER SURGERY    . TONSILLECTOMY  1953  . TOTAL HIP ARTHROPLASTY Right 2012     Prior to  Admission medications   Medication Sig Start Date End Date Taking? Authorizing Provider  aspirin EC 81 MG tablet Take 81 mg by mouth daily.   Yes Historical Provider, MD  atorvastatin (LIPITOR) 80 MG tablet Take 1 tablet (80 mg total) by mouth daily at 6 PM. 11/14/15  Yes Imogene Burn, PA-C  bisacodyl (DULCOLAX) 10 MG suppository Place 1 suppository (10 mg total) rectally daily as needed for moderate constipation. 08/26/16  Yes Vaughan Basta, MD   carvedilol (COREG) 6.25 MG tablet Take 1 tablet (6.25 mg total) by mouth 2 (two) times daily with a meal. 02/25/16  Yes Coral Spikes, DO  docusate sodium (COLACE) 100 MG capsule Take 1 capsule (100 mg total) by mouth 2 (two) times daily. 08/26/16  Yes Vaughan Basta, MD  ferrous sulfate 325 (65 FE) MG tablet Take 1 tablet (325 mg total) by mouth 3 (three) times daily after meals. 08/26/16  Yes Vaughan Basta, MD  furosemide (LASIX) 20 MG tablet Take 1 tablet (20 mg total) by mouth every other day. 12/18/15  Yes Wellington Hampshire, MD  HYDROcodone-acetaminophen (NORCO/VICODIN) 5-325 MG tablet Take 1 tablet by mouth every 4 (four) hours as needed for moderate pain or severe pain. 08/26/16  Yes Vaughan Basta, MD  multivitamin-iron-minerals-folic acid (CENTRUM) chewable tablet Chew 1 tablet by mouth daily.   Yes Historical Provider, MD  nitroGLYCERIN (NITROSTAT) 0.4 MG SL tablet Place 1 tablet (0.4 mg total) under the tongue every 5 (five) minutes as needed for chest pain. 11/14/15  Yes Imogene Burn, PA-C  omega-3 acid ethyl esters (LOVAZA) 1 g capsule Take 1 g by mouth daily.   Yes Historical Provider, MD  polyethylene glycol (MIRALAX / GLYCOLAX) packet Take 17 g by mouth daily as needed for mild constipation. 08/26/16  Yes Vaughan Basta, MD  pregabalin (LYRICA) 75 MG capsule Take 1 capsule (75 mg total) by mouth 2 (two) times daily. 07/29/16  Yes Coral Spikes, DO  sertraline (ZOLOFT) 100 MG tablet Take 1 tablet (100 mg total) by mouth daily. 02/25/16  Yes Coral Spikes, DO  thiamine (VITAMIN B-1) 100 MG tablet Take 1 tablet (100 mg total) by mouth daily. 10/18/16  Yes Coral Spikes, DO  Vitamin D, Ergocalciferol, (DRISDOL) 50000 units CAPS capsule Take 50,000 Units by mouth every Wednesday.    Yes Historical Provider, MD  enoxaparin (LOVENOX) 40 MG/0.4ML injection Inject 0.4 mLs (40 mg total) into the skin daily. 08/26/16 09/04/16  Vaughan Basta, MD  Lactobacillus-Inulin  ([place]) CAPS Take 1 capsule by mouth every morning. 10/27/16 11/26/16  Merlyn Lot, MD  ondansetron (ZOFRAN) 4 MG tablet Take 1 tablet (4 mg total) by mouth daily as needed for nausea or vomiting. 10/27/16 10/27/17  Merlyn Lot, MD  pantoprazole (PROTONIX) 40 MG tablet Take 1 tablet (40 mg total) by mouth daily. 05/12/16   Coral Spikes, DO  vancomycin (VANCOCIN HCL) 125 MG capsule Take 1 capsule (125 mg total) by mouth 4 (four) times daily. 10/27/16 11/06/16  Merlyn Lot, MD     Allergies Patient has no known allergies.   Family History  Problem Relation Age of Onset  . Heart disease Mother   . Stroke Mother   . Hypertension Mother   . Lung cancer Mother   . Hyperlipidemia Mother   . CAD Sister   . Heart disease Sister   . Stroke Sister   . Hypertension Sister   . Arthritis Sister   . CAD Brother   .  Heart disease Brother   . Hypertension Brother   . Heart disease Sister   . Stroke Sister   . Hypertension Sister   . Arthritis Sister   . Heart disease Sister   . Stroke Sister   . Hypertension Sister   . Breast cancer Sister   . Heart disease Sister   . Stroke Sister   . Hypertension Sister   . Heart disease Brother   . Hypertension Brother   . Heart disease Brother   . Hypertension Brother   . Heart disease Brother   . Hypertension Brother   . Heart disease Brother   . Hypertension Brother   . Heart disease Brother   . Hypertension Brother     Social History Social History  Substance Use Topics  . Smoking status: Never Smoker  . Smokeless tobacco: Never Used  . Alcohol use No    Review of Systems  Constitutional:   No fever or chills.  ENT:   No sore throat. No rhinorrhea. Cardiovascular:   No chest pain. Respiratory:   No dyspnea or cough. Gastrointestinal:   Positive generalized abdominal pain, no vomiting, positive loose bowel movements  Genitourinary:   Negative for dysuria or difficulty urinating. Musculoskeletal:    Negative for focal pain or swelling Neurological:   Negative for headaches 10-point ROS otherwise negative.  ____________________________________________   PHYSICAL EXAM:  VITAL SIGNS: ED Triage Vitals  Enc Vitals Group     BP 10/28/16 1025 (!) 159/58     Pulse Rate 10/28/16 1025 63     Resp 10/28/16 1025 20     Temp 10/28/16 1025 98.5 F (36.9 C)     Temp Source 10/28/16 1025 Oral     SpO2 10/28/16 1025 96 %     Weight 10/28/16 1026 140 lb (63.5 kg)     Height 10/28/16 1026 5\' 1"  (1.549 m)     Head Circumference --      Peak Flow --      Pain Score 10/28/16 1026 0     Pain Loc --      Pain Edu? --      Excl. in [place]? --     Vital signs reviewed, nursing assessments reviewed.   Constitutional:   Alert and oriented. Well appearing and in no distress. Eyes:   No scleral icterus. No conjunctival pallor. PERRL. EOMI.  No nystagmus. ENT   Head:   Normocephalic and atraumatic.   Nose:   No congestion/rhinnorhea. No septal hematoma   Mouth/Throat:   MMM, no pharyngeal erythema. No peritonsillar mass.    Neck:   No stridor. No SubQ emphysema. No meningismus. Hematological/Lymphatic/Immunilogical:   No cervical lymphadenopathy. Cardiovascular:   RRR. Symmetric bilateral radial and DP pulses.  No murmurs.  Respiratory:   Normal respiratory effort without tachypnea nor retractions. Breath sounds are clear and equal bilaterally. No wheezes/rales/rhonchi. Gastrointestinal:   Soft and nontender. Non distended. There is no CVA tenderness.  No rebound, rigidity, or guarding. Genitourinary:   deferred Musculoskeletal:   Normal range of motion in all extremities. No joint effusions.  Right lower extremity is wrapped with gauze and [place] below the knee. This was taken down to examine the leg. There is a small area of previous incision over the right distal fibula laterally. This area is well-healed without inflammation or signs of infection. Nontender. No crepitus or induration or  drainage or fluctuance. Leg was rewrapped after placing a Xeroform gauze over the previous wound. Neurologic:   Normal  speech and language.  CN 2-10 normal. Motor grossly intact. No gross focal neurologic deficits are appreciated.  Skin:    Skin is warm, dry and intact. No rash noted.  No petechiae, purpura, or bullae.  ____________________________________________    LABS (pertinent positives/negatives) (all labs ordered are listed, but only abnormal results are displayed) Labs Reviewed  COMPREHENSIVE METABOLIC PANEL - Abnormal; Notable for the following:       Result Value   Glucose, Bld 117 (*)    Calcium 8.5 (*)    Total Protein 6.2 (*)    Albumin 3.2 (*)    AST 46 (*)    All other components within normal limits  CBC WITH DIFFERENTIAL/PLATELET - Abnormal; Notable for the following:    WBC 12.0 (*)    RDW 15.4 (*)    Neutro Abs 8.9 (*)    Monocytes Absolute 1.6 (*)    All other components within normal limits  LIPASE, BLOOD - Abnormal; Notable for the following:    Lipase <10 (*)    All other components within normal limits  URINE CULTURE  URINALYSIS, COMPLETE (UACMP) WITH MICROSCOPIC   ____________________________________________   EKG    ____________________________________________    RADIOLOGY  Ct Abdomen Pelvis W Contrast  Result Date: 10/27/2016 CLINICAL DATA:  Generalized abdominal pain worse while having a bowel move EXAM: CT ABDOMEN AND PELVIS WITH CONTRAST TECHNIQUE: Multidetector CT imaging of the abdomen and pelvis was performed using the standard protocol following bolus administration of intravenous contrast. CONTRAST:  139mL ISOVUE-300 IOPAMIDOL (ISOVUE-300) INJECTION 61% COMPARISON:  None. FINDINGS: Lower chest: No acute abnormality. Hepatobiliary: No focal liver abnormality is seen. No gallstones, gallbladder wall thickening, or biliary dilatation. Pancreas: Unremarkable. No pancreatic ductal dilatation or surrounding inflammatory changes. Spleen:  Multiple calcified splenic granulomas are noted. Adrenals/Urinary Tract: The adrenal glands are within normal limits. The kidneys demonstrates some renal cystic change on the right. No renal or ureteral calculi are noted. The bladder is well distended. Stomach/Bowel: Focal wall thickening and inflammatory changes are noted in the ascending colon consistent with a focal area of colitis. No definitive perforation or abscess is seen. The appendix is not visualize consistent with a prior surgical history. No other inflammatory changes of the bowel are noted. Vascular/Lymphatic: Aortic atherosclerosis. No enlarged abdominal or pelvic lymph nodes. Reproductive: Status post hysterectomy. No adnexal masses. Other: No abdominal wall hernia or abnormality. No abdominopelvic ascites. Musculoskeletal: Right hip replacement is noted. Diffuse degenerative changes of the lumbar spine are seen. IMPRESSION: Focal wall thickening and pericolonic inflammatory changes in the ascending colon consistent with colitis. No perforation or abscess is noted. Chronic changes as described above. Electronically Signed   By: Inez Catalina M.D.   On: 10/27/2016 16:50    ____________________________________________   PROCEDURES Procedures  ____________________________________________   INITIAL IMPRESSION / ASSESSMENT AND PLAN / ED COURSE  Pertinent labs & imaging results that were available during my care of the patient were reviewed by me and considered in my medical decision making (see chart for details).  Patient well appearing and not in distress, exam benign, vital signs unremarkable. I'll check labs today. She does have toxigenic C. difficile, but on oral vancomycin. If labs are unchanged and patient is tolerating oral intake and think she is still suitable for outpatient follow-up. Low suspicion for sepsis, megacolon, perforation or obstruction.    ----------------------------------------- 2:04 PM on  10/28/2016 -----------------------------------------  Workup negative. Labs and vitals unremarkable, exam unremarkable. Patient is currently in a wheelchair only due  to nonweightbearing status of the right leg with her recent surgery. She pivots to transfer to the toilet. Her niece comes and checks on her every day and assists her and washes her linens. She has a follow-up appointment with wound care center tomorrow. I have encouraged her and her daughter to make sure she feels her oral vancomycin prescription immediately since she reports she has not done so, as this will treat her C. difficile and help resolve her diarrhea.       ____________________________________________   FINAL CLINICAL IMPRESSION(S) / ED DIAGNOSES  Final diagnoses:  C. difficile colitis  Fatigue, unspecified type      New Prescriptions   No medications on file     Portions of this note were generated with dragon dictation software. Dictation errors may occur despite best attempts at proofreading.    Carrie Mew, MD 10/28/16 [id number]

## 2016-10-28 NOTE — ED Notes (Signed)
Pt placed in chair per her request.

## 2016-10-28 NOTE — ED Triage Notes (Signed)
Pt via ems from home with weakness and falling lately. Seen yesterday here and diagnosed with C-diff. She states she was advised to be admitted but that she refused as she had to go home to care for her sister. She states she feels about the same as she did yesterday; started antibiotics last night for c-diff. Pt states she has been drinking gatorade and boost to feel better.

## 2016-10-28 NOTE — Discharge Instructions (Signed)
Please fill the prescription for oral vancomycin and take as prescribed to treat your colon infection and diarrhea.

## 2016-10-28 NOTE — ED Notes (Signed)
Pt discharged home after verbalizing understanding of discharge instructions; nad noted. 

## 2016-10-29 ENCOUNTER — Encounter: Payer: Medicare HMO | Admitting: Surgery

## 2016-10-29 DIAGNOSIS — I110 Hypertensive heart disease with heart failure: Secondary | ICD-10-CM | POA: Diagnosis not present

## 2016-10-29 DIAGNOSIS — I509 Heart failure, unspecified: Secondary | ICD-10-CM | POA: Diagnosis not present

## 2016-10-29 DIAGNOSIS — T8189XA Other complications of procedures, not elsewhere classified, initial encounter: Secondary | ICD-10-CM | POA: Diagnosis not present

## 2016-10-29 DIAGNOSIS — T8131XA Disruption of external operation (surgical) wound, not elsewhere classified, initial encounter: Secondary | ICD-10-CM | POA: Diagnosis not present

## 2016-10-29 DIAGNOSIS — M84471G Pathological fracture, right ankle, subsequent encounter for fracture with delayed healing: Secondary | ICD-10-CM | POA: Diagnosis not present

## 2016-10-29 DIAGNOSIS — G629 Polyneuropathy, unspecified: Secondary | ICD-10-CM | POA: Diagnosis not present

## 2016-10-29 DIAGNOSIS — L97311 Non-pressure chronic ulcer of right ankle limited to breakdown of skin: Secondary | ICD-10-CM | POA: Diagnosis not present

## 2016-10-29 DIAGNOSIS — I890 Lymphedema, not elsewhere classified: Secondary | ICD-10-CM | POA: Diagnosis not present

## 2016-10-29 DIAGNOSIS — I252 Old myocardial infarction: Secondary | ICD-10-CM | POA: Diagnosis not present

## 2016-10-29 DIAGNOSIS — M1990 Unspecified osteoarthritis, unspecified site: Secondary | ICD-10-CM | POA: Diagnosis not present

## 2016-10-29 DIAGNOSIS — Y839 Surgical procedure, unspecified as the cause of abnormal reaction of the patient, or of later complication, without mention of misadventure at the time of the procedure: Secondary | ICD-10-CM | POA: Diagnosis not present

## 2016-10-30 LAB — URINE CULTURE: Culture: 20000 — AB

## 2016-10-30 NOTE — Progress Notes (Signed)
ANNALEISE, BURGER ([id number]) Visit Report for 10/29/2016 Arrival Information Details Patient Name: KEMARIA, DEDIC I. Date of Service: 10/29/2016 12:45 PM Medical Record Number: [id number] Patient Account Number: [id number] Date of Birth/Sex: 08-Jul-1930 (83 y.o. Female) Treating RN: Afful, RN, BSN, Velva Harman Primary Care Seana Underwood: Thersa Salt Other Clinician: Referring Bushra Denman: Thersa Salt Treating Arin Vanosdol/Extender: Frann Rider in Treatment: 3 Visit Information History Since Last Visit All ordered tests and consults were completed: No Patient Arrived: Wheel Chair Added or deleted any medications: No Arrival Time: 12:47 Any new allergies or adverse reactions: No Accompanied By: dtr Had a fall or experienced change in No activities of daily living that may affect Transfer Assistance: None risk of falls: Patient Identification Verified: Yes Signs or symptoms of abuse/neglect since last No Secondary Verification Process Yes visito Completed: Hospitalized since last visit: No Patient Requires Transmission-Based No Has Dressing in Place as Prescribed: Yes Precautions: Pain Present Now: No Patient Has Alerts: No Electronic Signature(s) Signed: 10/29/2016 1:34:01 PM By: Regan Lemming BSN, RN Entered By: Regan Lemming on 10/29/2016 12:51:38 [name] ([id number]) -------------------------------------------------------------------------------- Encounter Discharge Information Details Patient Name: [name] Date of Service: 10/29/2016 12:45 PM Medical Record Number: [id number] Patient Account Number: [id number] Date of Birth/Sex: 1929-12-21 (83 y.o. Female) Treating RN: Baruch Gouty, RN, BSN, Velva Harman Primary Care [address]: Thersa Salt Other Clinician: Referring Leandrea Ackley: Thersa Salt Treating Rosely Fernandez/Extender: Frann Rider in Treatment: 3 Encounter Discharge Information Items Discharge Pain Level: 0 Discharge Condition: Stable Ambulatory Status: Wheelchair Discharge Destination:  Home Transportation: Private Auto Accompanied By: cargiver/niece Schedule Follow-up Appointment: No Medication Reconciliation completed and provided to No Patient/Care Ardean Melroy: Provided on Clinical Summary of Care: 10/29/2016 Form Type Recipient Paper Patient DK Electronic Signature(s) Signed: 10/29/2016 1:17:55 PM By: Ruthine Dose Entered By: Ruthine Dose on 10/29/2016 13:17:55 [name] ([id number]) -------------------------------------------------------------------------------- Lower Extremity Assessment Details Patient Name: [name] Date of Service: 10/29/2016 12:45 PM Medical Record Number: [id number] Patient Account Number: [id number] Date of Birth/Sex: [birth year] (83 y.o. Female) Treating RN: Afful, RN, BSN, Velva Harman Primary Care Nioka Thorington: Thersa Salt Other Clinician: Referring Myson Levi: Thersa Salt Treating Dorianna Mckiver/Extender: Frann Rider in Treatment: 3 Vascular Assessment Claudication: Claudication Assessment [Right:None] Pulses: Dorsalis Pedis Palpable: [Right:Yes] Posterior Tibial Extremity colors, hair growth, and conditions: Extremity Color: [Right:Mottled] Hair Growth on Extremity: [Right:No] Temperature of Extremity: [Right:Warm] Capillary Refill: [Right:< 3 seconds] Electronic Signature(s) Signed: 10/29/2016 1:34:01 PM By: Regan Lemming BSN, RN Entered By: Regan Lemming on 10/29/2016 12:59:14 [name] ([id number]) -------------------------------------------------------------------------------- Multi Wound Chart Details Patient Name: [name] Date of Service: 10/29/2016 12:45 PM Medical Record Number: [id number] Patient Account Number: [id number] Date of Birth/Sex: 24-Nov-1929 (83 y.o. Female) Treating RN: Baruch Gouty, RN, BSN, Velva Harman Primary Care Chrislyn Seedorf: Thersa Salt Other Clinician: Referring Josh Nicolosi: Thersa Salt Treating Kayelee Herbig/Extender: Frann Rider in Treatment: 3 Vital Signs Height(in): 61 Pulse(bpm): 60 Weight(lbs): 140  Blood Pressure 100/52 (mmHg): Body Mass Index(BMI): 26 Temperature(F): 97.6 Respiratory Rate 16 (breaths/min): Photos: [1:No Photos] [N/A:N/A] Wound Location: [1:Right Lower Leg - Lateral] [N/A:N/A] Wounding Event: [1:Surgical Injury] [N/A:N/A] Primary Etiology: [1:Open Surgical Wound] [N/A:N/A] Comorbid History: [1:Congestive Heart Failure, Hypertension, Myocardial Infarction, Osteoarthritis, Neuropathy] [N/A:N/A] Date Acquired: [1:08/24/2016] [N/A:N/A] Weeks of Treatment: [1:3] [N/A:N/A] Wound Status: [1:Open] [N/A:N/A] Measurements L x W x D 4x0.4x0.1 [N/A:N/A] (cm) Area (cm) : [1:1.257] [N/A:N/A] Volume (cm) : [1:0.126] [N/A:N/A] % Reduction in Area: [1:33.30%] [N/A:N/A] % Reduction in Volume: 83.30% [N/A:N/A] Classification: [1:Partial Thickness] [N/A:N/A] Exudate Amount: [1:Small] [N/A:N/A] Exudate Type: [1:Serous] [N/A:N/A] Exudate Color: [1:amber] [N/A:N/A] Wound Margin: [  1:Distinct, outline attached] [N/A:N/A] Granulation Amount: [1:None Present (0%)] [N/A:N/A] Necrotic Amount: [1:Large (67-100%)] [N/A:N/A] Necrotic Tissue: [1:Eschar, Adherent Slough] [N/A:N/A] Exposed Structures: [1:Fat Layer (Subcutaneous Tissue) Exposed: Yes] [N/A:N/A] Epithelialization: [1:Medium (34-66%)] [N/A:N/A] Debridement: [1:Debridement (24268- [id number])] [N/A:N/A] Pre-procedure 13:03 N/A N/A Verification/Time Out Taken: Pain Control: Lidocaine 4% Topical N/A N/A Solution Tissue Debrided: Necrotic/Eschar, N/A N/A Fibrin/Slough, Subcutaneous Level: Skin/Subcutaneous N/A N/A Tissue Debridement Area (sq 1.6 N/A N/A cm): Instrument: Curette N/A N/A Bleeding: Minimum N/A N/A Hemostasis Achieved: Pressure N/A N/A Procedural Pain: 0 N/A N/A Post Procedural Pain: 0 N/A N/A Debridement Treatment Procedure was tolerated N/A N/A Response: well Post Debridement 4x0.4x0.1 N/A N/A Measurements L x W x D (cm) Post Debridement 0.126 N/A N/A Volume: (cm) Periwound Skin Texture:  Excoriation: No N/A N/A Induration: No Callus: No Crepitus: No Rash: No Scarring: No Periwound Skin Maceration: No N/A N/A Moisture: Dry/Scaly: No Periwound Skin Color: Atrophie Blanche: No N/A N/A Cyanosis: No Ecchymosis: No Erythema: No Hemosiderin Staining: No Mottled: No Pallor: No Rubor: No Temperature: No Abnormality N/A N/A Tenderness on Yes N/A N/A Palpation: Wound Preparation: Ulcer Cleansing: N/A N/A Rinsed/Irrigated with Saline, Other: surg scrub and water Topical Anesthetic FOLASHADE, GAMBOA I. ([id number]) Applied: Other: lidocaine 4% Procedures Performed: Debridement N/A N/A Treatment Notes Wound #1 (Right, Lateral Lower Leg) 1. Cleansed with: Cleanse wound with antibacterial soap and water 4. Dressing Applied: Aquacel Ag 5. Secondary Dressing Applied ABD Pad 7. Secured with 3 Layer Compression System - Right Lower Extremity Notes unna to anchor Electronic Signature(s) Signed: 10/29/2016 1:24:31 PM By: Christin Fudge MD, FACS Entered By: Christin Fudge on 10/29/2016 13:24:31 [name] ([id number]) -------------------------------------------------------------------------------- [place] Details Patient Name: [name] Date of Service: 10/29/2016 12:45 PM Medical Record Number: [id number] Patient Account Number: [id number] Date of Birth/Sex: [birth year] (83 y.o. Female) Treating RN: Afful, RN, BSN, [employer] Primary Care [place] Inthavong: Thersa Salt Other Clinician: Referring Twilla Khouri: Thersa Salt Treating Sydny Schnitzler/Extender: Frann Rider in Treatment: 3 Active Inactive ` Abuse / Safety / Falls / Self Care Management Nursing Diagnoses: Potential for falls Goals: Patient will remain injury free Date Initiated: 10/07/2016 Target Resolution Date: 12/18/2016 Goal Status: Active Interventions: Assess fall risk on admission and as needed Assess impairment of mobility on admission and as needed per policy Notes: ` Nutrition Nursing  Diagnoses: Imbalanced nutrition Goals: Patient/caregiver agrees to and verbalizes understanding of need to use nutritional supplements and/or vitamins as prescribed Date Initiated: 10/07/2016 Target Resolution Date: 12/18/2016 Goal Status: Active Interventions: Assess patient nutrition upon admission and as needed per policy Notes: ` Orientation to the Wound Care Program Nursing Diagnoses: LENNAN, MALONE ([id number]) Knowledge deficit related to the wound healing center program Goals: Patient/caregiver will verbalize understanding of the [facility] Program Date Initiated: 10/07/2016 Target Resolution Date: 10/23/2016 Goal Status: Active Interventions: Provide education on orientation to the wound center Notes: ` Pain, Acute or Chronic Nursing Diagnoses: Pain, acute or chronic: actual or potential Potential alteration in comfort, pain Goals: Patient/caregiver will verbalize adequate pain control between visits Date Initiated: 10/07/2016 Target Resolution Date: 12/18/2016 Goal Status: Active Interventions: Assess comfort goal upon admission Complete pain assessment as per visit requirements Notes: ` Wound/Skin Impairment Nursing Diagnoses: Impaired tissue integrity Knowledge deficit related to smoking impact on wound healing Knowledge deficit related to ulceration/compromised skin integrity Goals: Ulcer/skin breakdown will have a volume reduction of 80% by week 12 Date Initiated: 10/07/2016 Target Resolution Date: 12/11/2016 Goal Status: Active Interventions: Assess patient/caregiver ability to perform ulcer/skin care regimen upon admission  and as needed Assess ulceration(s) every visit ALLIE, OUSLEY ([id number]) Notes: Electronic Signature(s) Signed: 10/29/2016 1:34:01 PM By: Regan Lemming BSN, RN Entered By: Regan Lemming on 10/29/2016 13:03:24 Peggyann Juba IMarland Kitchen ([id number]) -------------------------------------------------------------------------------- Pain  Assessment Details Patient Name: [name] Date of Service: 10/29/2016 12:45 PM Medical Record Number: [id number] Patient Account Number: [id number] Date of Birth/Sex: 1930/04/14 (83 y.o. Female) Treating RN: Baruch Gouty, RN, BSN, Velva Harman Primary Care Jaeleen Inzunza: Thersa Salt Other Clinician: Referring Skyllar Notarianni: Thersa Salt Treating Maximino Cozzolino/Extender: Frann Rider in Treatment: 3 Active Problems Location of Pain Severity and Description of Pain Patient Has Paino No Site Locations With Dressing Change: No Pain Management and Medication Current Pain Management: Electronic Signature(s) Signed: 10/29/2016 1:34:01 PM By: Regan Lemming BSN, RN Entered By: Regan Lemming on 10/29/2016 12:51:56 [name] ([id number]) -------------------------------------------------------------------------------- Patient/Caregiver Education Details Patient Name: [name] Date of Service: 10/29/2016 12:45 PM Medical Record Number: [id number] Patient Account Number: [id number] Date of Birth/Gender: [birth year] (83 y.o. Female) Treating RN: Baruch Gouty, RN, BSN, Velva Harman Primary Care Physician: Thersa Salt Other Clinician: Referring Physician: Thersa Salt Treating Physician/Extender: Frann Rider in Treatment: 3 Education Assessment Education Provided To: Patient Education Topics Provided Welcome To [facility] [place]: Methods: Explain/Verbal Responses: State content correctly Wound/Skin Impairment: Methods: Explain/Verbal Responses: State content correctly Electronic Signature(s) Signed: 10/29/2016 1:34:01 PM By: Regan Lemming BSN, RN Entered By: Regan Lemming on 10/29/2016 13:08:58 Peggyann Juba IMarland Kitchen ([id number]) -------------------------------------------------------------------------------- Wound Assessment Details Patient Name: [name] Date of Service: 10/29/2016 12:45 PM Medical Record Number: [id number] Patient Account Number: [id number] Date of Birth/Sex: October 01, 1929 (83 y.o. Female) Treating  RN: Afful, RN, BSN, [place] Primary Care Braylon Grenda: Thersa Salt Other Clinician: Referring Younes Degeorge: Thersa Salt Treating Lizvette Lightsey/Extender: Frann Rider in Treatment: 3 Wound Status Wound Number: 1 Primary Open Surgical Wound Etiology: Wound Location: Right Lower Leg - Lateral Wound Open Wounding Event: Surgical Injury Status: Date Acquired: 08/24/2016 Comorbid Congestive Heart Failure, Weeks Of Treatment: 3 History: Hypertension, Myocardial Infarction, Clustered Wound: No Osteoarthritis, Neuropathy Photos Photo Uploaded By: Regan Lemming on 10/29/2016 13:31:17 Wound Measurements Length: (cm) 4 Width: (cm) 0.4 Depth: (cm) 0.1 Area: (cm) 1.257 Volume: (cm) 0.126 % Reduction in Area: 33.3% % Reduction in Volume: 83.3% Epithelialization: Medium (34-66%) Tunneling: No Undermining: No Wound Description Classification: Partial Thickness Wound Margin: Distinct, outline attached Exudate Amount: Small Exudate Type: Serous Exudate Color: amber Foul Odor After Cleansing: No Slough/Fibrino Yes Wound Bed Granulation Amount: None Present (0%) Exposed Structure Necrotic Amount: Large (67-100%) Fat Layer (Subcutaneous Tissue) Exposed: Yes Necrotic Quality: Eschar, Adherent Slough [place], [place] I. ([id number]) Periwound Skin Texture Texture Color No Abnormalities Noted: No No Abnormalities Noted: No Callus: No Atrophie Blanche: No Crepitus: No Cyanosis: No Excoriation: No Ecchymosis: No Induration: No Erythema: No Rash: No Hemosiderin Staining: No Scarring: No Mottled: No Pallor: No Moisture Rubor: No No Abnormalities Noted: No Dry / Scaly: No Temperature / Pain Maceration: No Temperature: No Abnormality Tenderness on Palpation: Yes Wound Preparation Ulcer Cleansing: Rinsed/Irrigated with Saline, Other: surg scrub and water, Topical Anesthetic Applied: Other: lidocaine 4%, Treatment Notes Wound #1 (Right, Lateral Lower Leg) 1. Cleansed with: Cleanse wound  with antibacterial soap and water 4. Dressing Applied: Aquacel Ag 5. Secondary Dressing Applied ABD Pad 7. Secured with 3 Layer Compression System - Right Lower Extremity Notes unna to anchor Electronic Signature(s) Signed: 10/29/2016 1:34:01 PM By: Regan Lemming BSN, RN Entered By: Regan Lemming on 10/29/2016 12:59:02 [name] ([id number]) -------------------------------------------------------------------------------- Vitals Details Patient Name: [name] Date  of Service: 10/29/2016 12:45 PM Medical Record Number: [id number] Patient Account Number: [id number] Date of Birth/Sex: 1930/04/13 (84 y.o. Female) Treating RN: Afful, RN, BSN, [place] Primary Care Sheneka Schrom: Thersa Salt Other Clinician: Referring Taiga Lupinacci: Thersa Salt Treating Jordany Russett/Extender: Frann Rider in Treatment: 3 Vital Signs Time Taken: 12:51 Temperature (F): 97.6 Height (in): 61 Pulse (bpm): 60 Weight (lbs): 140 Respiratory Rate (breaths/min): 16 Body Mass Index (BMI): 26.4 Blood Pressure (mmHg): 100/52 Reference Range: 80 - 120 mg / dl Electronic Signature(s) Signed: 10/29/2016 1:34:01 PM By: Regan Lemming BSN, RN Entered By: Regan Lemming on 10/29/2016 12:54:37

## 2016-10-30 NOTE — Progress Notes (Signed)
MEAGHAN, WHISTLER ([id number]) Visit Report for 10/29/2016 Chief Complaint Document Details Patient Name: Katherine [name], Katherine [name] I. Date of Service: 10/29/2016 12:45 PM Medical Record Number: [id number] Patient Account Number: [id number] Date of Birth/Sex: 13-Jul-1930 (83 y.o. Female) Treating RN: Baruch Gouty, RN, BSN, Velva Harman Primary Care Provider: Thersa Salt Other Clinician: Referring Provider: Thersa Salt Treating Provider/Extender: Frann Rider in Treatment: 3 Information Obtained from: Patient Chief Complaint Patient presents to the wound care center with open non-healing surgical wound(s) of the right ankle after a trimalleolar fracture was repaired ion 9th January 2018 Electronic Signature(s) Signed: 10/29/2016 1:24:49 PM By: Christin Fudge MD, FACS Entered By: Christin Fudge on 10/29/2016 13:24:49 Katherine Juba I. ([id number]) -------------------------------------------------------------------------------- Debridement Details Patient Name: Katherine Juba I. Date of Service: 10/29/2016 12:45 PM Medical Record Number: [id number] Patient Account Number: [id number] Date of Birth/Sex: [birth year] (83 y.o. Female) Treating RN: Afful, RN, BSN, [place] Primary Care Provider: Thersa Salt Other Clinician: Referring Provider: Thersa Salt Treating Provider/Extender: Frann Rider in Treatment: 3 Debridement Performed for Wound #1 Right,Lateral Lower Leg Assessment: Performed By: Physician Christin Fudge, MD Debridement: Debridement Pre-procedure Yes - 13:03 Verification/Time Out Taken: Start Time: 13:03 Pain Control: Lidocaine 4% Topical Solution Level: Skin/Subcutaneous Tissue Total Area Debrided (L x 4 (cm) x 0.4 (cm) = 1.6 (cm) W): Tissue and other Non-Viable, Eschar, Fibrin/Slough, Subcutaneous material debrided: Instrument: Curette Bleeding: Minimum Hemostasis Achieved: Pressure End Time: 13:06 Procedural Pain: 0 Post Procedural Pain: 0 Response to Treatment: Procedure was tolerated well Post  Debridement Measurements of Total Wound Length: (cm) 4 Width: (cm) 0.4 Depth: (cm) 0.1 Volume: (cm) 0.126 Character of Wound/Ulcer Post Stable Debridement: Severity of Tissue Post Debridement: Fat layer exposed Post Procedure Diagnosis Same as Pre-procedure Electronic Signature(s) Signed: 10/29/2016 1:24:43 PM By: Christin Fudge MD, FACS Signed: 10/29/2016 1:34:01 PM By: Regan Lemming BSN, RN Entered By: Christin Fudge on 10/29/2016 13:24:43 Katherine Juba I. ([id number]) Katherine [name], Katherine I. ([id number]) -------------------------------------------------------------------------------- HPI Details Patient Name: Katherine Juba I. Date of Service: 10/29/2016 12:45 PM Medical Record Number: [id number] Patient Account Number: [id number] Date of Birth/Sex: [birth year] (83 y.o. Female) Treating RN: Baruch Gouty, RN, BSN, Velva Harman Primary Care Provider: Thersa Salt Other Clinician: Referring Provider: Thersa Salt Treating Provider/Extender: Frann Rider in Treatment: 3 History of Present Illness Location: right lateral ankle Quality: Patient reports experiencing a dull pain to affected area(s). Severity: Patient states wound (s) are getting better. Duration: Patient has had the wound for < 6 weeks prior to presenting for treatment Timing: Pain in wound is constant (hurts all the time) Context: The wound appeared gradually over time Modifying Factors: Other treatment(s) tried include:nonweightbearing boot Associated Signs and Symptoms: Patient reports having increase swelling. HPI Description: 83 year old patient who has recently been seen several times at the orthopedic office for problems with her wound healing after she had a right ankle trimalleolar fracture repaired on 08/24/2016. He simply did notice some drainage from the incision and she was reviewed in the orthopedic office on 10/01/2016 and started on Keflex and Septra. She was sent to [place] for an opinion is also than be following up with the  orthopedic physician. Most recent x-rays taken of the orthopedic office were within normal limits. The patient does not have any significant history of diabetes mellitus or smoking but has had edema problems in the legs and does say that she's seen a vein surgeon in the past. All this is mainly done in [place], [place] where she normally resides. Electronic Signature(s) Signed: 10/29/2016 1:24:54 PM By: Christin Fudge MD, FACS  Entered By: Christin Fudge on 10/29/2016 13:24:54 Katherine [name] ([id number]) -------------------------------------------------------------------------------- Physical Exam Details Patient Name: Katherine [name], Katherine I. Date of Service: 10/29/2016 12:45 PM Medical Record Number: [id number] Patient Account Number: [id number] Date of Birth/Sex: 1929/08/27 (83 y.o. Female) Treating RN: Baruch Gouty, RN, BSN, Velva Harman Primary Care Provider: Thersa Salt Other Clinician: Referring Provider: Thersa Salt Treating Provider/Extender: Frann Rider in Treatment: 3 Constitutional . Pulse regular. Respirations normal and unlabored. Afebrile. . Eyes Nonicteric. Reactive to light. Ears, Nose, Mouth, and Throat Lips, teeth, and gums WNL.Marland Kitchen Moist mucosa without lesions. Neck supple and nontender. No palpable supraclavicular or cervical adenopathy. Normal sized without goiter. Respiratory WNL. No retractions.. Breath sounds WNL, No rubs, rales, rhonchi, or wheeze.. Cardiovascular Heart rhythm and rate regular, no murmur or gallop.. Pedal Pulses WNL. No clubbing, cyanosis or edema. Lymphatic No adneopathy. No adenopathy. No adenopathy. Musculoskeletal Adexa without tenderness or enlargement.. Digits and nails w/o clubbing, cyanosis, infection, petechiae, ischemia, or inflammatory conditions.. Integumentary (Hair, Skin) No suspicious lesions. No crepitus or fluctuance. No peri-wound warmth or erythema. No masses.Marland Kitchen Psychiatric Judgement and insight Intact.. No evidence of depression, anxiety, or  agitation.. Notes the right lower leg wound on the lateral side has some eschar and subcutaneous debris in the center of the wound and this was sharply debrided with a #3 curet and minimal bleeding controlled with pressure Electronic Signature(s) Signed: 10/29/2016 1:25:26 PM By: Christin Fudge MD, FACS Entered By: Christin Fudge on 10/29/2016 13:25:26 Katherine [name] ([id number]) -------------------------------------------------------------------------------- Physician Orders Details Patient Name: Katherine Juba I. Date of Service: 10/29/2016 12:45 PM Medical Record Number: [id number] Patient Account Number: [id number] Date of Birth/Sex: 04-Sep-1929 (83 y.o. Female) Treating RN: Baruch Gouty, RN, BSN, Velva Harman Primary Care Provider: Thersa Salt Other Clinician: Referring Provider: Thersa Salt Treating Provider/Extender: Frann Rider in Treatment: 3 Verbal / Phone Orders: No Diagnosis Coding Wound Cleansing Wound #1 Right,Lateral Lower Leg o Clean wound with wound cleanser. Anesthetic Wound #1 Right,Lateral Lower Leg o Topical Lidocaine 4% cream applied to wound bed prior to debridement - for clinic use Primary Wound Dressing Wound #1 Right,Lateral Lower Leg o Aquacel Ag Secondary Dressing Wound #1 Right,Lateral Lower Leg o Gauze, ABD and Kerlix/Conform Dressing Change Frequency Wound #1 Right,Lateral Lower Leg o Change dressing every week - pt to get changed at wound care center next week Follow-up Appointments Wound #1 Right,Lateral Lower Leg o Return Appointment in 1 week. Edema Control Wound #1 Right,Lateral Lower Leg o 3 Layer Compression System - Right Lower Extremity - unna to anchor o Elevate legs to the level of the heart and pump ankles as often as possible Additional Orders / Instructions Wound #1 Right,Lateral Lower Leg o Increase protein intake. [place] ([id number]) Wound #1 Right,Lateral Lower Leg o [place] Visits -  [place] Nurse may visit PRN to address patientos wound care needs. o FACE TO FACE ENCOUNTER: MEDICARE and MEDICAID PATIENTS: I certify that this patient is under my care and that I had a face-to-face encounter that meets the physician face-to-face encounter requirements with this patient on this date. The encounter with the patient was in whole or in part for the following MEDICAL CONDITION: (primary reason for [place]) MEDICAL NECESSITY: I certify, that based on my findings, NURSING services are a medically necessary home health service. HOME BOUND STATUS: I certify that my clinical findings support that this patient is homebound ([place]., Due to illness or injury, pt requires aid of supportive devices such as crutches, cane,  wheelchairs, walkers, the use of special transportation or the assistance of another person to leave their place of residence. There is a normal inability to leave the home and doing so requires considerable and taxing effort. Other absences are for medical reasons / religious services and are infrequent or of short duration when for other reasons). o If current dressing causes regression in wound condition, may D/C ordered dressing product/s and apply Normal Saline Moist Dressing daily until next [place] / Other MD appointment. [place] of regression in wound condition at [phone]. o Please direct any NON-WOUND related issues/requests for orders to patient's Primary Care Physician Electronic Signature(s) Signed: 10/29/2016 1:34:01 PM By: Regan Lemming BSN, RN Signed: 10/29/2016 1:47:27 PM By: Christin Fudge MD, FACS Entered By: Regan Lemming on 10/29/2016 13:07:48 Katherine Juba IMarland Kitchen ([id number]) -------------------------------------------------------------------------------- Problem List Details Patient Name: Katherine Juba I. Date of Service: 10/29/2016 12:45 PM Medical Record Number: [id number] Patient Account  Number: [id number] Date of Birth/Sex: 12/05/1929 (83 y.o. 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 ([id number]) -------------------------------------------------------------------------------- Progress Note Details Patient Name: Katherine Juba I. Date of Service: 10/29/2016 12:45 PM Medical Record Number: [id number] Patient Account Number: [id number] Date of Birth/Sex: 1930/04/09 (83 y.o. Female) Treating RN: Afful, RN, BSN, Velva Harman Primary Care Provider: Thersa Salt Other Clinician: Referring Provider: Thersa Salt Treating Provider/Extender: Frann Rider in Treatment: 3 Subjective Chief Complaint Information obtained from Patient Patient presents to the wound care center with open non-healing surgical wound(s) of the right ankle after a trimalleolar fracture was repaired ion 9th January 2018 History of Present Illness (HPI) The following HPI elements were documented for the patient's wound: Location: right lateral ankle Quality: Patient reports experiencing a dull pain to affected area(s). Severity: Patient states wound (s) are getting better. Duration: Patient has had the wound for < 6 weeks prior to  presenting for treatment Timing: Pain in wound is constant (hurts all the time) Context: The wound appeared gradually over time Modifying Factors: Other treatment(s) tried include:nonweightbearing boot Associated Signs and Symptoms: Patient reports having increase swelling. 83 year old patient who has recently been seen several times at the orthopedic office for problems with her wound healing after she had a right ankle trimalleolar fracture repaired on 08/24/2016. He simply did notice some drainage from the incision and she was reviewed in the orthopedic office on 10/01/2016 and started on Keflex and Septra. She was sent to [place] for an opinion is also than be following up with the orthopedic physician. Most recent x-rays taken of the orthopedic office were within normal limits. The patient does not have any significant history of diabetes mellitus or smoking but has had edema problems in the legs and does say that she's seen a vein surgeon in the past. All this is mainly done in [place], [place] where she normally resides. Objective Constitutional Pulse regular. Respirations normal and unlabored. Afebrile. Vitals Time Taken: 12:51 PM,  Height: 61 in, Weight: 140 lbs, BMI: 26.4, Temperature: 97.6 F, Pulse: 60 bpm, Respiratory Rate: 16 breaths/min, Blood Pressure: 100/52 mmHg. [place], [place] I. ([id number]) Eyes Nonicteric. Reactive to light. Ears, Nose, Mouth, and Throat Lips, teeth, and gums WNL.Marland Kitchen Moist mucosa without lesions. Neck supple and nontender. No palpable supraclavicular or cervical adenopathy. Normal sized without goiter. Respiratory WNL. No retractions.. Breath sounds WNL, No rubs, rales, rhonchi, or wheeze.. Cardiovascular Heart rhythm and rate regular, no murmur or gallop.. Pedal Pulses WNL. No clubbing, cyanosis or edema. Lymphatic No adneopathy. No adenopathy. No adenopathy. Musculoskeletal Adexa without tenderness or enlargement.. Digits and nails w/o clubbing, cyanosis,  infection, petechiae, ischemia, or inflammatory conditions.Marland Kitchen Psychiatric Judgement and insight Intact.. No evidence of depression, anxiety, or agitation.. General Notes: the right lower leg wound on the lateral side has some eschar and subcutaneous debris in the center of the wound and this was sharply debrided with a #3 curet and minimal bleeding controlled with pressure Integumentary (Hair, Skin) No suspicious lesions. No crepitus or fluctuance. No peri-wound warmth or erythema. No masses.. Wound #1 status is Open. Original cause of wound was Surgical Injury. The wound is located on the Right,Lateral Lower Leg. The wound measures 4cm length x 0.4cm width x 0.1cm depth; 1.257cm^2 area and 0.126cm^3 volume. There is Fat Layer (Subcutaneous Tissue) Exposed exposed. There is no tunneling or undermining noted. There is a small amount of serous drainage noted. The wound margin is distinct with the outline attached to the wound base. There is no granulation within the wound bed. There is a large (67- 100%) amount of necrotic tissue within the wound bed including Eschar and Adherent Slough. The periwound skin appearance did not exhibit: Callus, Crepitus, Excoriation, Induration, Rash, Scarring, Dry/Scaly, Maceration, Atrophie [place], Cyanosis, Ecchymosis, Hemosiderin Staining, Mottled, Pallor, Rubor, Erythema. Periwound temperature was noted as No Abnormality. The periwound has tenderness on palpation. Assessment Katherine [name], Katherine I. 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 There  was a Skin/Subcutaneous Tissue Debridement ([id number]) debridement with total area of 1.6 sq cm performed by Christin Fudge, MD. with the following instrument(s): Curette to remove Non-Viable tissue/material including Fibrin/Slough, Eschar, and Subcutaneous after achieving pain control using Lidocaine 4% Topical Solution. A time out was conducted at 13:03, prior to the start of the procedure. A Minimum amount of bleeding was controlled with Pressure. The procedure was tolerated well with a pain level of 0 throughout and a pain level of 0 following the procedure. Post Debridement Measurements: 4cm length x 0.4cm width x 0.1cm depth; 0.126cm^3 volume. Character of Wound/Ulcer Post Debridement is stable. Severity of Tissue Post Debridement is: Fat layer exposed. Post procedure Diagnosis Wound #1: Same as Pre-Procedure Plan Wound Cleansing: Wound #1 Right,Lateral Lower Leg: Clean wound with wound cleanser. Anesthetic: Wound #1 Right,Lateral Lower Leg: Topical Lidocaine 4% cream applied to wound bed prior to debridement - for clinic use Primary Wound Dressing: Wound #1 Right,Lateral Lower Leg: Aquacel Ag Secondary Dressing: Wound #1 Right,Lateral Lower Leg: Gauze, ABD and Kerlix/Conform Dressing Change Frequency: Wound #1 Right,Lateral Lower Leg: Change dressing every week - pt to get changed at wound care center next week Katherine [name], Katherine I. ([id number]) Follow-up  Appointments: Wound #1 Right,Lateral Lower Leg: Return Appointment in 1 week. Edema Control: Wound #1 Right,Lateral Lower Leg: 3 Layer Compression System - Right Lower Extremity - unna to anchor Elevate legs to the level of the heart and pump ankles as often as possible Additional Orders / Instructions: Wound #1 Right,Lateral Lower Leg: Increase protein intake. Home Health: Wound #1 Right,Lateral Lower Leg: Continue Home Health Visits - [place] Nurse may visit PRN to address patient s wound care needs. FACE TO  FACE ENCOUNTER: MEDICARE and MEDICAID PATIENTS: I certify that this patient is under my care and that I had a face-to-face encounter that meets the physician face-to-face encounter requirements with this patient on this date. The encounter with the patient was in whole or in part for the following MEDICAL CONDITION: (primary reason for [place]) MEDICAL NECESSITY: I certify, that based on my findings, NURSING services are a medically necessary home health service. HOME BOUND STATUS: I certify that my clinical findings support that this patient is homebound ([place]., Due to illness or injury, pt requires aid of supportive devices such as crutches, cane, wheelchairs, walkers, the use of special transportation or the assistance of another person to leave their place of residence. There is a normal inability to leave the home and doing so requires considerable and taxing effort. Other absences are for medical reasons / religious services and are infrequent or of short duration when for other reasons). If current dressing causes regression in wound condition, may D/C ordered dressing product/s and apply Normal Saline Moist Dressing daily until next [place] / Other MD appointment. [place] of regression in wound condition at [phone]. Please direct any NON-WOUND related issues/requests for orders to patient's Primary Care Physician after review I have recommended: 1. Silver alginate to be covered with Drawtex and some gauze padding and wrapped with a Profore lite 2. Elevation has been recommended 3. Phisical Therapy as per the orthopedic physician's orders 4. review next weeks after the orthopedic opinion Electronic Signature(s) Signed: 10/29/2016 1:25:52 PM By: Christin Fudge MD, FACS Entered By: Christin Fudge on 10/29/2016 13:25:52 Katherine Juba I. ([id number]) -------------------------------------------------------------------------------- [place]  Details Patient Name: Katherine Juba I. Date of Service: 10/29/2016 Medical Record Number: [id number] Patient Account Number: [id number] Date of Birth/Sex: 1930/07/02 (83 y.o. 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 I89.0 Lymphedema, not elsewhere classified M84.471G Pathological fracture, right ankle, subsequent encounter for fracture with delayed healing Facility Procedures CPT4: Description Modifier Quantity Code [id number] 11042 - DEB SUBQ TISSUE 20 SQ CM/< 1 ICD-10 Description Diagnosis T81.31XA Disruption of external operation (surgical) wound, not elsewhere classified, initial encounter L97.311 Non-pressure chronic ulcer  of right ankle limited to breakdown of skin I89.0 Lymphedema, not elsewhere classified M84.471G Pathological fracture, right ankle, subsequent encounter for fracture with delayed healing Physician Procedures CPT4: Description Modifier Quantity Code [id number] 11042 - WC PHYS SUBQ TISS 20 SQ CM 1 ICD-10 Description Diagnosis T81.31XA Disruption of external operation (surgical) wound, not elsewhere classified, initial encounter L97.311 Non-pressure chronic ulcer  of right ankle limited to breakdown of skin I89.0 Lymphedema, not elsewhere classified M84.471G Pathological fracture, right ankle, subsequent encounter for fracture with delayed healing Katherine [name], Katherine I. ([id number]) Electronic  Signature(s) Signed: 10/29/2016 1:26:03 PM By: Christin Fudge MD, FACS Entered By: Christin Fudge on 10/29/2016 13:26:03

## 2016-11-03 DIAGNOSIS — I5042 Chronic combined systolic (congestive) and diastolic (congestive) heart failure: Secondary | ICD-10-CM | POA: Diagnosis not present

## 2016-11-03 DIAGNOSIS — I255 Ischemic cardiomyopathy: Secondary | ICD-10-CM | POA: Diagnosis not present

## 2016-11-03 DIAGNOSIS — I2510 Atherosclerotic heart disease of native coronary artery without angina pectoris: Secondary | ICD-10-CM | POA: Diagnosis not present

## 2016-11-03 DIAGNOSIS — J449 Chronic obstructive pulmonary disease, unspecified: Secondary | ICD-10-CM | POA: Diagnosis not present

## 2016-11-03 DIAGNOSIS — K219 Gastro-esophageal reflux disease without esophagitis: Secondary | ICD-10-CM | POA: Diagnosis not present

## 2016-11-03 DIAGNOSIS — R69 Illness, unspecified: Secondary | ICD-10-CM | POA: Diagnosis not present

## 2016-11-03 DIAGNOSIS — S82851D Displaced trimalleolar fracture of right lower leg, subsequent encounter for closed fracture with routine healing: Secondary | ICD-10-CM | POA: Diagnosis not present

## 2016-11-03 DIAGNOSIS — E785 Hyperlipidemia, unspecified: Secondary | ICD-10-CM | POA: Diagnosis not present

## 2016-11-03 DIAGNOSIS — M1990 Unspecified osteoarthritis, unspecified site: Secondary | ICD-10-CM | POA: Diagnosis not present

## 2016-11-03 DIAGNOSIS — I110 Hypertensive heart disease with heart failure: Secondary | ICD-10-CM | POA: Diagnosis not present

## 2016-11-05 ENCOUNTER — Encounter: Payer: Medicare HMO | Admitting: Surgery

## 2016-11-05 DIAGNOSIS — T8131XA Disruption of external operation (surgical) wound, not elsewhere classified, initial encounter: Secondary | ICD-10-CM | POA: Diagnosis not present

## 2016-11-05 DIAGNOSIS — J449 Chronic obstructive pulmonary disease, unspecified: Secondary | ICD-10-CM | POA: Diagnosis not present

## 2016-11-05 DIAGNOSIS — Y839 Surgical procedure, unspecified as the cause of abnormal reaction of the patient, or of later complication, without mention of misadventure at the time of the procedure: Secondary | ICD-10-CM | POA: Diagnosis not present

## 2016-11-05 DIAGNOSIS — I252 Old myocardial infarction: Secondary | ICD-10-CM | POA: Diagnosis not present

## 2016-11-05 DIAGNOSIS — I890 Lymphedema, not elsewhere classified: Secondary | ICD-10-CM | POA: Diagnosis not present

## 2016-11-05 DIAGNOSIS — S82851D Displaced trimalleolar fracture of right lower leg, subsequent encounter for closed fracture with routine healing: Secondary | ICD-10-CM | POA: Diagnosis not present

## 2016-11-05 DIAGNOSIS — G629 Polyneuropathy, unspecified: Secondary | ICD-10-CM | POA: Diagnosis not present

## 2016-11-05 DIAGNOSIS — L97311 Non-pressure chronic ulcer of right ankle limited to breakdown of skin: Secondary | ICD-10-CM | POA: Diagnosis not present

## 2016-11-05 DIAGNOSIS — T8189XA Other complications of procedures, not elsewhere classified, initial encounter: Secondary | ICD-10-CM | POA: Diagnosis not present

## 2016-11-05 DIAGNOSIS — M84471G Pathological fracture, right ankle, subsequent encounter for fracture with delayed healing: Secondary | ICD-10-CM | POA: Diagnosis not present

## 2016-11-05 DIAGNOSIS — M1990 Unspecified osteoarthritis, unspecified site: Secondary | ICD-10-CM | POA: Diagnosis not present

## 2016-11-05 DIAGNOSIS — I5042 Chronic combined systolic (congestive) and diastolic (congestive) heart failure: Secondary | ICD-10-CM | POA: Diagnosis not present

## 2016-11-05 DIAGNOSIS — I110 Hypertensive heart disease with heart failure: Secondary | ICD-10-CM | POA: Diagnosis not present

## 2016-11-05 DIAGNOSIS — R69 Illness, unspecified: Secondary | ICD-10-CM | POA: Diagnosis not present

## 2016-11-05 DIAGNOSIS — K219 Gastro-esophageal reflux disease without esophagitis: Secondary | ICD-10-CM | POA: Diagnosis not present

## 2016-11-05 DIAGNOSIS — I509 Heart failure, unspecified: Secondary | ICD-10-CM | POA: Diagnosis not present

## 2016-11-05 DIAGNOSIS — E785 Hyperlipidemia, unspecified: Secondary | ICD-10-CM | POA: Diagnosis not present

## 2016-11-05 DIAGNOSIS — I255 Ischemic cardiomyopathy: Secondary | ICD-10-CM | POA: Diagnosis not present

## 2016-11-05 DIAGNOSIS — I2510 Atherosclerotic heart disease of native coronary artery without angina pectoris: Secondary | ICD-10-CM | POA: Diagnosis not present

## 2016-11-06 NOTE — Progress Notes (Signed)
THURMA, PRIEGO ([id number]) Visit Report for 11/05/2016 Chief Complaint Document Details Patient Name: Katherine [name], Katherine [name]. Date of Service: 11/05/2016 12:45 PM Medical Record Number: [id number] Patient Account Number: [id number] Date of Birth/Sex: 01-03-1930 (83 y.o. Female) Treating RN: Baruch Gouty, RN, BSN, Velva Harman Primary Care Provider: Thersa Salt Other Clinician: Referring Provider: Thersa Salt Treating Provider/Extender: Frann Rider in Treatment: 4 Information Obtained from: Patient Chief Complaint Patient presents to the wound care center with open non-healing surgical wound(s) of the right ankle after a trimalleolar fracture was repaired ion 9th January 2018 Electronic Signature(s) Signed: 11/05/2016 1:18:57 PM By: Christin Fudge MD, FACS Entered By: Christin Fudge on 11/05/2016 13:18:57 Katherine Juba [name]. ([id number]) -------------------------------------------------------------------------------- Debridement Details Patient Name: Katherine Juba [name]. Date of Service: 11/05/2016 12:45 PM Medical Record Number: [id number] Patient Account Number: [id number] Date of Birth/Sex: 1930-06-09 (83 y.o. Female) Treating RN: Afful, RN, BSN, [place] Primary Care Provider: Thersa Salt Other Clinician: Referring Provider: Thersa Salt Treating Provider/Extender: Frann Rider in Treatment: 4 Debridement Performed for Wound #1 Right,Lateral Lower Leg Assessment: Performed By: Physician Christin Fudge, MD Debridement: Debridement Pre-procedure Yes - 13:05 Verification/Time Out Taken: Start Time: 13:05 Pain Control: Lidocaine 4% Topical Solution Level: Skin/Subcutaneous Tissue Total Area Debrided (L x 2 (cm) x 0.4 (cm) = 0.8 (cm) W): Tissue and other Non-Viable, Fibrin/Slough, Subcutaneous material debrided: Instrument: Curette Bleeding: Minimum Hemostasis Achieved: Pressure End Time: 13:07 Procedural Pain: Insensate Post Procedural Pain: Insensate Response to Treatment: Procedure was tolerated  well Post Debridement Measurements of Total Wound Length: (cm) 2 Width: (cm) 0.4 Depth: (cm) 0.2 Volume: (cm) 0.126 Character of Wound/Ulcer Post Stable Debridement: Severity of Tissue Post Debridement: Fat layer exposed Post Procedure Diagnosis Same as Pre-procedure Electronic Signature(s) Signed: 11/05/2016 1:18:52 PM By: Christin Fudge MD, FACS Signed: 11/05/2016 1:23:55 PM By: Regan Lemming BSN, RN Entered By: Christin Fudge on 11/05/2016 13:18:52 Katherine Juba [name]. ([id number]) Katherine [name], Katherine IMarland Kitchen ([id number]) -------------------------------------------------------------------------------- HPI Details Patient Name: Katherine Juba [name]. Date of Service: 11/05/2016 12:45 PM Medical Record Number: [id number] Patient Account Number: [id number] Date of Birth/Sex: September 17, 1929 (83 y.o. Female) Treating RN: Baruch Gouty, RN, BSN, Velva Harman Primary Care Provider: Thersa Salt Other Clinician: Referring Provider: Thersa Salt Treating Provider/Extender: Frann Rider in Treatment: 4 History of Present Illness Location: right lateral ankle Quality: Patient reports experiencing a dull pain to affected area(s). Severity: Patient states wound (s) are getting better. Duration: Patient has had the wound for < 6 weeks prior to presenting for treatment Timing: Pain in wound is constant (hurts all the time) Context: The wound appeared gradually over time Modifying Factors: Other treatment(s) tried include:nonweightbearing boot Associated Signs and Symptoms: Patient reports having increase swelling. HPI Description: 83 year old patient who has recently been seen several times at the orthopedic office for problems with her wound healing after she had a right ankle trimalleolar fracture repaired on 08/24/2016. He simply did notice some drainage from the incision and she was reviewed in the orthopedic office on 10/01/2016 and started on Keflex and Septra. She was sent to [place] for an opinion is also than be following up with  the orthopedic physician. Most recent x-rays taken of the orthopedic office were within normal limits. The patient does not have any significant history of diabetes mellitus or smoking but has had edema problems in the legs and does say that she's seen a vein surgeon in the past. All this is mainly done in [place], [place] where she normally resides. Electronic Signature(s) Signed: 11/05/2016 1:19:04 PM By: Christin Fudge MD, FACS Entered  By: Christin Fudge on 11/05/2016 13:19:04 Katherine [name] ([id number]) -------------------------------------------------------------------------------- Physical Exam Details Patient Name: Katherine [name], Katherine [name]. Date of Service: 11/05/2016 12:45 PM Medical Record Number: [id number] Patient Account Number: [id number] Date of Birth/Sex: [birth year] (83 y.o. Female) Treating RN: Baruch Gouty, RN, BSN, Velva Harman Primary Care Provider: Thersa Salt Other Clinician: Referring Provider: Thersa Salt Treating Provider/Extender: Frann Rider in Treatment: 4 Constitutional . Pulse regular. Respirations normal and unlabored. Afebrile. . Eyes Nonicteric. Reactive to light. Ears, Nose, Mouth, and Throat Lips, teeth, and gums WNL.Marland Kitchen Moist mucosa without lesions. Neck supple and nontender. No palpable supraclavicular or cervical adenopathy. Normal sized without goiter. Respiratory WNL. No retractions.. Cardiovascular Pedal Pulses WNL. No clubbing, cyanosis or edema. Genitourinary (GU) No hydrocele, spermatocele, tenderness of the cord, or testicular mass.Marland Kitchen Penis without lesions.Lowella Fairy without lesions. No cystocele, or rectocele. Pelvic support intact, no discharge.Marland Kitchen Urethra without masses, tenderness or scarring.Marland Kitchen Lymphatic No adneopathy. No adenopathy. No adenopathy. Musculoskeletal Adexa without tenderness or enlargement.. Digits and nails w/o clubbing, cyanosis, infection, petechiae, ischemia, or inflammatory conditions.. Integumentary (Hair, Skin) No suspicious lesions. No  crepitus or fluctuance. No peri-wound warmth or erythema. No masses.Marland Kitchen Psychiatric Judgement and insight Intact.. No evidence of depression, anxiety, or agitation.. Notes the wound on the right lower leg laterally continues to have some subcutaneous debris and [name]'ll sharply remove this with a #3 curet and minimal bleeding controlled with pressure. The most superior part of the wound is healing nicely Electronic Signature(s) Signed: 11/05/2016 1:19:42 PM By: Christin Fudge MD, FACS Entered By: Christin Fudge on 11/05/2016 13:19:42 Katherine [name] ([id number]) -------------------------------------------------------------------------------- Physician Orders Details Patient Name: Katherine Juba [name]. Date of Service: 11/05/2016 12:45 PM Medical Record Number: [id number] Patient Account Number: [id number] Date of Birth/Sex: [birth year] (82 y.o. Female) Treating RN: Afful, RN, BSN, [place] Primary Care Provider: Thersa Salt Other Clinician: Referring Provider: Thersa Salt Treating Provider/Extender: Frann Rider in Treatment: 4 Verbal / Phone Orders: No Diagnosis Coding Wound Cleansing Wound #1 Right,Lateral Lower Leg o Cleanse wound with mild soap and water - in clinic Anesthetic Wound #1 Right,Lateral Lower Leg o Topical Lidocaine 4% cream applied to wound bed prior to debridement - for clinic use Skin Barriers/Peri-Wound Care Wound #1 Right,Lateral Lower Leg o Moisturizing lotion Primary Wound Dressing Wound #1 Right,Lateral Lower Leg o Aquacel Ag Secondary Dressing Wound #1 Right,Lateral Lower Leg o Gauze, ABD and Kerlix/Conform Dressing Change Frequency Wound #1 Right,Lateral Lower Leg o Change dressing every week - pt to get changed at wound care center next week Follow-up Appointments Wound #1 Right,Lateral Lower Leg o Return Appointment in 1 week. Edema Control Wound #1 Right,Lateral Lower Leg o 3 Layer Compression System - Right Lower Extremity - unna to  anchor o Elevate legs to the level of the heart and pump ankles as often as possible Additional Orders / Instructions Katherine [name], Katherine [name]. ([id number]) Wound #1 Right,Lateral Lower Leg o Increase protein intake. Home Health Wound #1 [place] Visits - [place] Nurse may visit PRN to address patientos wound care needs. o FACE TO FACE ENCOUNTER: MEDICARE and MEDICAID PATIENTS: [name] that this patient is under my care and that [name] had a face-to-face encounter that meets the physician face-to-face encounter requirements with this patient on this date. The encounter with the patient was in whole or in part for the following MEDICAL CONDITION: (primary reason for [place]) MEDICAL NECESSITY: [name], that based on my findings, NURSING services are a medically necessary home  health service. HOME BOUND STATUS: [name] that my clinical findings support that this patient is homebound (Maddene., Due to illness or injury, pt requires aid of supportive devices such as crutches, cane, wheelchairs, walkers, the use of special transportation or the assistance of another person to leave their place of residence. There is a normal inability to leave the home and doing so requires considerable and taxing effort. Other absences are for medical reasons / religious services and are infrequent or of short duration when for other reasons). o If current dressing causes regression in wound condition, may D/C ordered dressing product/s and apply Normal Saline Moist Dressing daily until next [place] / Other MD appointment. [place] of regression in wound condition at [phone]. o Please direct any NON-WOUND related issues/requests for orders to patient's Primary Care Physician Electronic Signature(s) Signed: 11/05/2016 1:23:55 PM By: Regan Lemming BSN, RN Signed: 11/05/2016 4:18:08 PM By: Christin Fudge MD,  FACS Entered By: Regan Lemming on 11/05/2016 [place], DotseroMarland Kitchen ([id number]) -------------------------------------------------------------------------------- Problem List Details Patient Name: Katherine Juba [name]. Date of Service: 11/05/2016 12:45 PM Medical Record Number: [id number] Patient Account Number: [id number] Date of Birth/Sex: 1930/06/11 (83 y.o. 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 ([id number]) -------------------------------------------------------------------------------- Progress Note Details Patient Name: Katherine Juba [name]. Date of Service: 11/05/2016 12:45 PM Medical Record Number: [id number] Patient Account Number: [id number] Date of Birth/Sex: July 29, 1930 (83 y.o. Female) Treating RN: Afful, RN, BSN, Velva Harman Primary Care Provider: Thersa Salt Other Clinician: Referring Provider: Thersa Salt Treating Provider/Extender: Frann Rider in Treatment: 4 Subjective Chief Complaint Information obtained from Patient Patient presents to the wound care center with open non-healing surgical wound(s) of the right ankle after a trimalleolar fracture was repaired ion 9th January 2018 History of Present  Illness (HPI) The following HPI elements were documented for the patient's wound: Location: right lateral ankle Quality: Patient reports experiencing a dull pain to affected area(s). Severity: Patient states wound (s) are getting better. Duration: Patient has had the wound for < 6 weeks prior to presenting for treatment Timing: Pain in wound is constant (hurts all the time) Context: The wound appeared gradually over time Modifying Factors: Other treatment(s) tried include:nonweightbearing boot Associated Signs and Symptoms: Patient reports having increase swelling. 83 year old patient who has recently been seen several times at the orthopedic office for problems with her wound healing after she had a right ankle trimalleolar fracture repaired on 08/24/2016. He simply did notice some drainage from the incision and she was reviewed in the orthopedic office on 10/01/2016 and started on Keflex and Septra. She was sent to [place] for an opinion is also than be following up with the orthopedic physician. Most recent x-rays taken of the orthopedic office were within normal limits. The patient does not have any significant history of diabetes mellitus or smoking but has had edema problems in the legs and does say that she's  seen a vein surgeon in the past. All this is mainly done in [place], [place] where she normally resides. Objective Constitutional Pulse regular. Respirations normal and unlabored. Afebrile. Vitals Time Taken: 12:45 PM, Height: 61 in, Weight: 140 lbs, BMI: 26.4, Temperature: 97.9 F, Pulse: 56 bpm, Respiratory Rate: 16 breaths/min, Blood Pressure: 135/57 mmHg. [place], [place] [name]. ([id number]) Eyes Nonicteric. Reactive to light. Ears, Nose, Mouth, and Throat Lips, teeth, and gums WNL.Marland Kitchen Moist mucosa without lesions. Neck supple and nontender. No palpable supraclavicular or cervical adenopathy. Normal sized without goiter. Respiratory WNL. No retractions.. Cardiovascular Pedal Pulses  WNL. No clubbing, cyanosis or edema. Genitourinary (GU) No hydrocele, spermatocele, tenderness of the cord, or testicular mass.Marland Kitchen Penis without lesions.Lowella Fairy without lesions. No cystocele, or rectocele. Pelvic support intact, no discharge.Marland Kitchen Urethra without masses, tenderness or scarring.Marland Kitchen Lymphatic No adneopathy. No adenopathy. No adenopathy. Musculoskeletal Adexa without tenderness or enlargement.. Digits and nails w/o clubbing, cyanosis, infection, petechiae, ischemia, or inflammatory conditions.Marland Kitchen Psychiatric Judgement and insight Intact.. No evidence of depression, anxiety, or agitation.. General Notes: the wound on the right lower leg laterally continues to have some subcutaneous debris and [name]'ll sharply remove this with a #3 curet and minimal bleeding controlled with pressure. The most superior part of the wound is healing nicely Integumentary (Hair, Skin) No suspicious lesions. No crepitus or fluctuance. No peri-wound warmth or erythema. No masses.. Wound #1 status is Open. Original cause of wound was Surgical Injury. The wound is located on the Right,Lateral Lower Leg. The wound measures 2cm length x 0.4cm width x 0.2cm depth; 0.628cm^2 area and 0.126cm^3 volume. There is Fat Layer (Subcutaneous Tissue) Exposed exposed. There is no tunneling or undermining noted. There is a small amount of serous drainage noted. The wound margin is distinct with the outline attached to the wound base. There is no granulation within the wound bed. There is a large (67- 100%) amount of necrotic tissue within the wound bed including Adherent Slough. The periwound skin appearance did not exhibit: Callus, Crepitus, Excoriation, Induration, Rash, Scarring, Dry/Scaly, Maceration, Atrophie Blanche, Cyanosis, Ecchymosis, Hemosiderin Staining, Mottled, Pallor, Rubor, Erythema. Periwound temperature was noted as No Abnormality. The periwound has tenderness on palpation. Katherine [name], Katherine [name].  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 There was a Skin/Subcutaneous Tissue Debridement ([id number]) debridement with total area of 0.8 sq cm performed by Christin Fudge, MD. with the following instrument(s): Curette to remove Non-Viable tissue/material including Fibrin/Slough and Subcutaneous after achieving pain control using Lidocaine 4% Topical Solution. A time out was conducted at 13:05, prior to the start of the procedure. A Minimum amount of bleeding was controlled with Pressure. The procedure was tolerated well with a pain level of Insensate throughout and a pain level of Insensate following the procedure. Post Debridement Measurements: 2cm length x 0.4cm width x 0.2cm depth; 0.126cm^3 volume. Character of Wound/Ulcer Post Debridement is stable. Severity of Tissue Post Debridement is: Fat layer exposed. Post procedure Diagnosis Wound #1: Same as Pre-Procedure Plan Wound Cleansing: Wound #1 Right,Lateral Lower Leg: Cleanse wound with mild soap and water - in clinic Anesthetic: Wound #1 Right,Lateral Lower Leg: Topical Lidocaine 4% cream applied to wound bed prior to debridement -  for clinic use Skin Barriers/Peri-Wound Care: Wound #1 Right,Lateral Lower Leg: Moisturizing lotion Primary Wound Dressing: Katherine [name], Katherine [name]. ([id number]) Wound #1 Right,Lateral Lower Leg: Aquacel Ag Secondary Dressing: Wound #1 Right,Lateral Lower Leg: Gauze, ABD and Kerlix/Conform Dressing Change Frequency: Wound #1 Right,Lateral Lower Leg: Change dressing every week  - pt to get changed at wound care center next week Follow-up Appointments: Wound #1 Right,Lateral Lower Leg: Return Appointment in 1 week. Edema Control: Wound #1 Right,Lateral Lower Leg: 3 Layer Compression System - Right Lower Extremity - unna to anchor Elevate legs to the level of the heart and pump ankles as often as possible Additional Orders / Instructions: Wound #1 Right,Lateral Lower Leg: Increase protein intake. Home Health: Wound #1 Right,Lateral Lower Leg: Continue Home Health Visits - [place] Nurse may visit PRN to address patient s wound care needs. FACE TO FACE ENCOUNTER: MEDICARE and MEDICAID PATIENTS: [name] that this patient is under my care and that [name] had a face-to-face encounter that meets the physician face-to-face encounter requirements with this patient on this date. The encounter with the patient was in whole or in part for the following MEDICAL CONDITION: (primary reason for [place]) MEDICAL NECESSITY: [name], that based on my findings, NURSING services are a medically necessary home health service. HOME BOUND STATUS: [name] that my clinical findings support that this patient is homebound (Maddene., Due to illness or injury, pt requires aid of supportive devices such as crutches, cane, wheelchairs, walkers, the use of special transportation or the assistance of another person to leave their place of residence. There is a normal inability to leave the home and doing so requires considerable and taxing effort. Other absences are for medical reasons / religious services and are infrequent or of short duration when for other reasons). If current dressing causes regression in wound condition, may D/C ordered dressing product/s and apply Normal Saline Moist Dressing daily until next [place] / Other MD appointment. Bruce of regression in wound condition at [phone]. Please direct any NON-WOUND related  issues/requests for orders to patient's Primary Care Physician after review [name] have recommended: 1. Silver alginate to be covered with Drawtex and some gauze padding and wrapped with a Profore lite 2. Elevation has been recommended 3. Phisical Therapy as per the orthopedic physician's orders 4. review next weeks after the orthopedic opinion Katherine [name], Katherine [name] ([id number]) Electronic Signature(s) Signed: 11/05/2016 1:20:14 PM By: Christin Fudge MD, FACS Entered By: Christin Fudge on 11/05/2016 13:20:13 Katherine Juba IMarland Kitchen ([id number]) -------------------------------------------------------------------------------- [place] Details Patient Name: Katherine Juba [name]. Date of Service: 11/05/2016 Medical Record Number: [id number] Patient Account Number: [id number] Date of Birth/Sex: 1929-09-11 (83 y.o. 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 I89.0 Lymphedema, not elsewhere classified M84.471G Pathological fracture, right ankle, subsequent encounter for fracture with delayed healing Facility Procedures CPT4: Description Modifier Quantity Code [id number] 11042 - DEB SUBQ TISSUE 20 SQ CM/< 1 ICD-10 Description Diagnosis T81.31XA Disruption of external operation (surgical) wound, not elsewhere classified, initial encounter L97.311 Non-pressure chronic ulcer  of right ankle limited to breakdown of skin I89.0 Lymphedema, not elsewhere classified Physician Procedures CPT4: Description Modifier Quantity Code [id number] 11042 - WC PHYS SUBQ TISS  20 SQ CM 1 ICD-10 Description Diagnosis T81.31XA Disruption of external  operation (surgical) wound, not elsewhere classified, initial encounter Y51.833 Non-pressure chronic ulcer  of right ankle limited to breakdown of skin I89.0 Lymphedema, not elsewhere classified Electronic Signature(s) Signed: 11/05/2016 1:20:23 PM By: Christin Fudge MD, FACS Katherine [name], Katherine Oddi [name]. ([id number]) Entered By: Christin Fudge on 11/05/2016 13:20:23

## 2016-11-06 NOTE — Progress Notes (Signed)
SAYRE, MAZOR ([id number]) Visit Report for 11/05/2016 Arrival Information Details Patient Name: Katherine [name], Katherine [name]. Date of Service: 11/05/2016 12:45 PM Medical Record Number: [id number] Patient Account Number: [id number] Date of Birth/Sex: 1930/07/02 (83 y.o. Female) Treating RN: Afful, RN, BSN, Velva Harman Primary Care Wendy Mikles: Thersa Salt Other Clinician: Referring Stephane Niemann: Thersa Salt Treating Lakayla Barrington/Extender: Frann Rider in Treatment: 4 Visit Information History Since Last Visit All ordered tests and consults were completed: No Patient Arrived: Wheel Chair Added or deleted any medications: No Arrival Time: 12:44 Any new allergies or adverse reactions: No Accompanied By: niece Had a fall or experienced change in No activities of daily living that may affect Transfer Assistance: None risk of falls: Patient Identification Verified: Yes Signs or symptoms of abuse/neglect since last No Secondary Verification Process Yes visito Completed: Hospitalized since last visit: No Patient Requires Transmission-Based No Has Dressing in Place as Prescribed: Yes Precautions: Pain Present Now: No Patient Has Alerts: No Electronic Signature(s) Signed: 11/05/2016 1:23:55 PM By: Regan Lemming BSN, RN Entered By: Regan Lemming on 11/05/2016 12:45:30 [name] ([id number]) -------------------------------------------------------------------------------- Encounter Discharge Information Details Patient Name: Katherine [name]. Date of Service: 11/05/2016 12:45 PM Medical Record Number: [id number] Patient Account Number: [id number] Date of Birth/Sex: [birth year] (83 y.o. Female) Treating RN: Baruch Gouty, RN, BSN, Velva Harman Primary Care Cowen Pesqueira: Thersa Salt Other Clinician: Referring Britnie Colville: Thersa Salt Treating Uziah Sorter/Extender: Frann Rider in Treatment: 4 Encounter Discharge Information Items Discharge Pain Level: 0 Discharge Condition: Stable Ambulatory Status: Wheelchair Discharge Destination:  Home Private Transportation: Auto Accompanied By: niece Schedule Follow-up Appointment: No Medication Reconciliation completed and No provided to Patient/Care Jacquelyne Quarry: Clinical Summary of Care: Provided Form Type Recipient Paper Patient DK Electronic Signature(s) Signed: 11/05/2016 1:19:19 PM By: Ruthine Dose Entered By: Ruthine Dose on 11/05/2016 13:19:19 Katherine [name]. ([id number]) -------------------------------------------------------------------------------- Lower Extremity Assessment Details Patient Name: Katherine [name]. Date of Service: 11/05/2016 12:45 PM Medical Record Number: [id number] Patient Account Number: [id number] Date of Birth/Sex: [birth year] (83 y.o. Female) Treating RN: Afful, RN, BSN, Velva Harman Primary Care Britainy Kozub: Thersa Salt Other Clinician: Referring Kaspar Albornoz: Thersa Salt Treating Monterius Rolf/Extender: Frann Rider in Treatment: 4 Edema Assessment Assessed: [Left: No] [Right: No] Edema: [Left: N] [Right: o] Vascular Assessment Claudication: Claudication Assessment [Right:None] Pulses: Dorsalis Pedis Palpable: [Right:Yes] Posterior Tibial Extremity colors, hair growth, and conditions: Extremity Color: [Right:Normal] Hair Growth on Extremity: [Right:Yes] Temperature of Extremity: [Right:Warm] Capillary Refill: [Right:< 3 seconds] Toe Nail Assessment Left: Right: Thick: Yes Discolored: Yes Deformed: No Improper Length and Hygiene: No Electronic Signature(s) Signed: 11/05/2016 1:23:55 PM By: Regan Lemming BSN, RN Entered By: Regan Lemming on 11/05/2016 12:58:56 Katherine Juba IMarland Kitchen ([id number]) -------------------------------------------------------------------------------- Multi Wound Chart Details Patient Name: Katherine [name]. Date of Service: 11/05/2016 12:45 PM Medical Record Number: [id number] Patient Account Number: [id number] Date of Birth/Sex: [birth year] (83 y.o. Female) Treating RN: Baruch Gouty, RN, BSN, Velva Harman Primary Care Anabia Weatherwax: Thersa Salt Other  Clinician: Referring Ashford Clouse: Thersa Salt Treating Jaquarius Seder/Extender: Frann Rider in Treatment: 4 Vital Signs Height(in): 61 Pulse(bpm): 56 Weight(lbs): 140 Blood Pressure 135/57 (mmHg): Body Mass Index(BMI): 26 Temperature(F): 97.9 Respiratory Rate 16 (breaths/min): Photos: [1:No Photos] [N/A:N/A] Wound Location: [1:Right Lower Leg - Lateral] [N/A:N/A] Wounding Event: [1:Surgical Injury] [N/A:N/A] Primary Etiology: [1:Open Surgical Wound] [N/A:N/A] Comorbid History: [1:Congestive Heart Failure, Hypertension, Myocardial Infarction, Osteoarthritis, Neuropathy] [N/A:N/A] Date Acquired: [1:08/24/2016] [N/A:N/A] Weeks of Treatment: [1:4] [N/A:N/A] Wound Status: [1:Open] [N/A:N/A] Measurements L x W x D 2x0.4x0.2 [N/A:N/A] (cm) Area (cm) : [1:0.628] [N/A:N/A] Volume (cm) : [1:0.126] [N/A:N/A] % Reduction in Area: [  1:66.70%] [N/A:N/A] % Reduction in Volume: 83.30% [N/A:N/A] Classification: [1:Partial Thickness] [N/A:N/A] Exudate Amount: [1:Small] [N/A:N/A] Exudate Type: [1:Serous] [N/A:N/A] Exudate Color: [1:amber] [N/A:N/A] Wound Margin: [1:Distinct, outline attached] [N/A:N/A] Granulation Amount: [1:None Present (0%)] [N/A:N/A] Necrotic Amount: [1:Large (67-100%)] [N/A:N/A] Exposed Structures: [1:Fat Layer (Subcutaneous Tissue) Exposed: Yes] [N/A:N/A] Epithelialization: [1:Medium (34-66%)] [N/A:N/A] Debridement: [1:Debridement ([id number]) 13:05] [N/A:N/A N/A] Pre-procedure Verification/Time Out Taken: Pain Control: Lidocaine 4% Topical N/A N/A Solution Tissue Debrided: Fibrin/Slough, N/A N/A Subcutaneous Level: Skin/Subcutaneous N/A N/A Tissue Debridement Area (sq 0.8 N/A N/A cm): Instrument: Curette N/A N/A Bleeding: Minimum N/A N/A Hemostasis Achieved: Pressure N/A N/A Procedural Pain: Insensate N/A N/A Post Procedural Pain: Insensate N/A N/A Debridement Treatment Procedure was tolerated N/A N/A Response: well Post Debridement 2x0.4x0.2 N/A  N/A Measurements L x W x D (cm) Post Debridement 0.126 N/A N/A Volume: (cm) Periwound Skin Texture: Excoriation: No N/A N/A Induration: No Callus: No Crepitus: No Rash: No Scarring: No Periwound Skin Maceration: No N/A N/A Moisture: Dry/Scaly: No Periwound Skin Color: Atrophie Blanche: No N/A N/A Cyanosis: No Ecchymosis: No Erythema: No Hemosiderin Staining: No Mottled: No Pallor: No Rubor: No Temperature: No Abnormality N/A N/A Tenderness on Yes N/A N/A Palpation: Wound Preparation: Ulcer Cleansing: N/A N/A Rinsed/Irrigated with Saline, Other: surg scrub and water Topical Anesthetic Applied: Other: lidocaine 4% LYNNAE, LUDEMANN [name]. ([id number]) Procedures Performed: Debridement N/A N/A Treatment Notes Wound #1 (Right, Lateral Lower Leg) 1. Cleansed with: Cleanse wound with antibacterial soap and water 3. Peri-wound Care: Moisturizing lotion 4. Dressing Applied: Aquacel Ag 5. Secondary Dressing Applied ABD Pad Dry Gauze 7. Secured with 3 Layer Compression System - Right Lower Extremity Notes unna to anchor Electronic Signature(s) Signed: 11/05/2016 1:18:46 PM By: Christin Fudge MD, FACS Entered By: Christin Fudge on 11/05/2016 13:18:46 [name] ([id number]) -------------------------------------------------------------------------------- Harmon Details Patient Name: Katherine [name]. Date of Service: 11/05/2016 12:45 PM Medical Record Number: [id number] Patient Account Number: [id number] Date of Birth/Sex: 26-Apr-1930 (83 y.o. Female) Treating RN: Afful, RN, BSN, [employer] Primary Care Philippe Gang: Thersa Salt Other Clinician: Referring Abeer Iversen: Thersa Salt Treating Jayvier Burgher/Extender: Frann Rider in Treatment: 4 Active Inactive ` Abuse / Safety / Falls / Self Care Management Nursing Diagnoses: Potential for falls Goals: Patient will remain injury free Date Initiated: 10/07/2016 Target Resolution Date: 12/18/2016 Goal Status:  Active Interventions: Assess fall risk on admission and as needed Assess impairment of mobility on admission and as needed per policy Notes: ` Nutrition Nursing Diagnoses: Imbalanced nutrition Goals: Patient/caregiver agrees to and verbalizes understanding of need to use nutritional supplements and/or vitamins as prescribed Date Initiated: 10/07/2016 Target Resolution Date: 12/18/2016 Goal Status: Active Interventions: Assess patient nutrition upon admission and as needed per policy Notes: ` Orientation to the Wound Care Program Nursing Diagnoses: DELONNA, NEY ([id number]) Knowledge deficit related to the wound healing center program Goals: Patient/caregiver will verbalize understanding of the [facility] Date Initiated: 10/07/2016 Target Resolution Date: 10/23/2016 Goal Status: Active Interventions: Provide education on orientation to the wound center Notes: ` Pain, Acute or Chronic Nursing Diagnoses: Pain, acute or chronic: actual or potential Potential alteration in comfort, pain Goals: Patient/caregiver will verbalize adequate pain control between visits Date Initiated: 10/07/2016 Target Resolution Date: 12/18/2016 Goal Status: Active Interventions: Assess comfort goal upon admission Complete pain assessment as per visit requirements Notes: ` Wound/Skin Impairment Nursing Diagnoses: Impaired tissue integrity Knowledge deficit related to smoking impact on wound healing Knowledge deficit related to ulceration/compromised skin integrity Goals: Ulcer/skin breakdown will have a volume reduction of  80% by week 12 Date Initiated: 10/07/2016 Target Resolution Date: 12/11/2016 Goal Status: Active Interventions: Assess patient/caregiver ability to perform ulcer/skin care regimen upon admission and as needed Assess ulceration(s) every visit TYRIANNA, LIGHTLE ([id number]) Notes: Electronic Signature(s) Signed: 11/05/2016 1:23:55 PM By: Regan Lemming BSN,  RN Entered By: Regan Lemming on 11/05/2016 13:04:41 Katherine [name]. ([id number]) -------------------------------------------------------------------------------- Pain Assessment Details Patient Name: Katherine [name]. Date of Service: 11/05/2016 12:45 PM Medical Record Number: [id number] Patient Account Number: [id number] Date of Birth/Sex: November 04, 1929 (83 y.o. Female) Treating RN: Baruch Gouty, RN, BSN, Velva Harman Primary Care Lucca Greggs: Thersa Salt Other Clinician: Referring Annalina Needles: Thersa Salt Treating Sidi Dzikowski/Extender: Frann Rider in Treatment: 4 Active Problems Location of Pain Severity and Description of Pain Patient Has Paino No Site Locations With Dressing Change: No Pain Management and Medication Current Pain Management: Electronic Signature(s) Signed: 11/05/2016 1:23:55 PM By: Regan Lemming BSN, RN Entered By: Regan Lemming on 11/05/2016 12:45:36 [name] ([id number]) -------------------------------------------------------------------------------- Patient/Caregiver Education Details Patient Name: Katherine [name]. Date of Service: 11/05/2016 12:45 PM Medical Record Number: [id number] Patient Account Number: [id number] Date of Birth/Gender: Aug 08, 1930 (83 y.o. Female) Treating RN: Baruch Gouty, RN, BSN, Velva Harman Primary Care Physician: Thersa Salt Other Clinician: Referring Physician: Thersa Salt Treating Physician/Extender: Frann Rider in Treatment: 4 Education Assessment Education Provided To: Patient Education Topics Provided Welcome To [facility]: Methods: Explain/Verbal Responses: State content correctly Wound Debridement: Methods: Explain/Verbal Responses: State content correctly Wound/Skin Impairment: Methods: Explain/Verbal Responses: State content correctly Electronic Signature(s) Signed: 11/05/2016 1:23:55 PM By: Regan Lemming BSN, RN Entered By: Regan Lemming on 11/05/2016 13:18:17 Katherine [name].  ([id number]) -------------------------------------------------------------------------------- Wound Assessment Details Patient Name: Katherine [name]. Date of Service: 11/05/2016 12:45 PM Medical Record Number: [id number] Patient Account Number: [id number] Date of Birth/Sex: [birth year] (83 y.o. Female) Treating RN: Afful, RN, BSN, [place] Primary Care Latayvia Mandujano: Thersa Salt Other Clinician: Referring Renuka Farfan: Thersa Salt Treating Kenyanna Grzesiak/Extender: Frann Rider in Treatment: 4 Wound Status Wound Number: 1 Primary Open Surgical Wound Etiology: Wound Location: Right Lower Leg - Lateral Wound Open Wounding Event: Surgical Injury Status: Date Acquired: 08/24/2016 Comorbid Congestive Heart Failure, Weeks Of Treatment: 4 History: Hypertension, Myocardial Infarction, Clustered Wound: No Osteoarthritis, Neuropathy Photos Photo Uploaded By: Regan Lemming on 11/05/2016 15:23:21 Wound Measurements Length: (cm) 2 Width: (cm) 0.4 Depth: (cm) 0.2 Area: (cm) 0.628 Volume: (cm) 0.126 % Reduction in Area: 66.7% % Reduction in Volume: 83.3% Epithelialization: Medium (34-66%) Tunneling: No Undermining: No Wound Description Classification: Partial Thickness Wound Margin: Distinct, outline attached Exudate Amount: Small Exudate Type: Serous Exudate Color: amber Foul Odor After Cleansing: No Slough/Fibrino Yes Wound Bed Granulation Amount: None Present (0%) Exposed Structure Necrotic Amount: Large (67-100%) Fat Layer (Subcutaneous Tissue) Exposed: Yes Necrotic Quality: Adherent [place]ough [place], [place] [name]. ([id number]) Periwound Skin Texture Texture Color No Abnormalities Noted: No No Abnormalities Noted: No Callus: No Atrophie Blanche: No Crepitus: No Cyanosis: No Excoriation: No Ecchymosis: No Induration: No Erythema: No Rash: No Hemosiderin Staining: No Scarring: No Mottled: No Pallor: No Moisture Rubor: No No Abnormalities Noted: No Dry / Scaly: No Temperature /  Pain Maceration: No Temperature: No Abnormality Tenderness on Palpation: Yes Wound Preparation Ulcer Cleansing: Rinsed/Irrigated with Saline, Other: surg scrub and water, Topical Anesthetic Applied: Other: lidocaine 4%, Treatment Notes Wound #1 (Right, Lateral Lower Leg) 1. Cleansed with: Cleanse wound with antibacterial soap and water 3. Peri-wound Care: Moisturizing lotion 4. Dressing Applied: Aquacel Ag 5. Secondary Dressing Applied ABD Pad Dry Gauze 7. Secured with 3 Layer Compression System -  Right Lower Extremity Notes unna to anchor Electronic Signature(s) Signed: 11/05/2016 1:23:55 PM By: Regan Lemming BSN, RN Entered By: Regan Lemming on 11/05/2016 12:58:33 Katherine Juba IMarland Kitchen ([id number]) -------------------------------------------------------------------------------- Vitals Details Patient Name: Katherine [name]. Date of Service: 11/05/2016 12:45 PM Medical Record Number: [id number] Patient Account Number: [id number] Date of Birth/Sex: 11-25-1929 (83 y.o. Female) Treating RN: Afful, RN, BSN, [employer] Primary Care Cherrill Scrima: Thersa Salt Other Clinician: Referring Kairi Tufo: Thersa Salt Treating Rui Wordell/Extender: Frann Rider in Treatment: 4 Vital Signs Time Taken: 12:45 Temperature (F): 97.9 Height (in): 61 Pulse (bpm): 56 Weight (lbs): 140 Respiratory Rate (breaths/min): 16 Body Mass Index (BMI): 26.4 Blood Pressure (mmHg): 135/57 Reference Range: 80 - 120 mg / dl Electronic Signature(s) Signed: 11/05/2016 1:23:55 PM By: Regan Lemming BSN, RN Entered By: Regan Lemming on 11/05/2016 12:50:17

## 2016-11-08 DIAGNOSIS — R69 Illness, unspecified: Secondary | ICD-10-CM | POA: Diagnosis not present

## 2016-11-08 DIAGNOSIS — S82851D Displaced trimalleolar fracture of right lower leg, subsequent encounter for closed fracture with routine healing: Secondary | ICD-10-CM | POA: Diagnosis not present

## 2016-11-08 DIAGNOSIS — E785 Hyperlipidemia, unspecified: Secondary | ICD-10-CM | POA: Diagnosis not present

## 2016-11-08 DIAGNOSIS — I110 Hypertensive heart disease with heart failure: Secondary | ICD-10-CM | POA: Diagnosis not present

## 2016-11-08 DIAGNOSIS — K219 Gastro-esophageal reflux disease without esophagitis: Secondary | ICD-10-CM | POA: Diagnosis not present

## 2016-11-08 DIAGNOSIS — I5042 Chronic combined systolic (congestive) and diastolic (congestive) heart failure: Secondary | ICD-10-CM | POA: Diagnosis not present

## 2016-11-08 DIAGNOSIS — M1990 Unspecified osteoarthritis, unspecified site: Secondary | ICD-10-CM | POA: Diagnosis not present

## 2016-11-08 DIAGNOSIS — I2510 Atherosclerotic heart disease of native coronary artery without angina pectoris: Secondary | ICD-10-CM | POA: Diagnosis not present

## 2016-11-08 DIAGNOSIS — J449 Chronic obstructive pulmonary disease, unspecified: Secondary | ICD-10-CM | POA: Diagnosis not present

## 2016-11-08 DIAGNOSIS — I255 Ischemic cardiomyopathy: Secondary | ICD-10-CM | POA: Diagnosis not present

## 2016-11-09 ENCOUNTER — Telehealth: Payer: Self-pay | Admitting: Cardiovascular Disease

## 2016-11-09 ENCOUNTER — Encounter: Payer: Self-pay | Admitting: Cardiovascular Disease

## 2016-11-09 ENCOUNTER — Ambulatory Visit (INDEPENDENT_AMBULATORY_CARE_PROVIDER_SITE_OTHER): Payer: Medicare HMO | Admitting: Cardiovascular Disease

## 2016-11-09 VITALS — BP 138/64 | HR 54 | Ht 61.0 in | Wt 140.0 lb

## 2016-11-09 DIAGNOSIS — I5022 Chronic systolic (congestive) heart failure: Secondary | ICD-10-CM

## 2016-11-09 DIAGNOSIS — S82851D Displaced trimalleolar fracture of right lower leg, subsequent encounter for closed fracture with routine healing: Secondary | ICD-10-CM | POA: Diagnosis not present

## 2016-11-09 DIAGNOSIS — I110 Hypertensive heart disease with heart failure: Secondary | ICD-10-CM | POA: Diagnosis not present

## 2016-11-09 DIAGNOSIS — E785 Hyperlipidemia, unspecified: Secondary | ICD-10-CM | POA: Diagnosis not present

## 2016-11-09 DIAGNOSIS — I10 Essential (primary) hypertension: Secondary | ICD-10-CM | POA: Diagnosis not present

## 2016-11-09 DIAGNOSIS — I5042 Chronic combined systolic (congestive) and diastolic (congestive) heart failure: Secondary | ICD-10-CM | POA: Diagnosis not present

## 2016-11-09 DIAGNOSIS — I2510 Atherosclerotic heart disease of native coronary artery without angina pectoris: Secondary | ICD-10-CM | POA: Diagnosis not present

## 2016-11-09 DIAGNOSIS — R69 Illness, unspecified: Secondary | ICD-10-CM | POA: Diagnosis not present

## 2016-11-09 DIAGNOSIS — J449 Chronic obstructive pulmonary disease, unspecified: Secondary | ICD-10-CM | POA: Diagnosis not present

## 2016-11-09 DIAGNOSIS — K219 Gastro-esophageal reflux disease without esophagitis: Secondary | ICD-10-CM | POA: Diagnosis not present

## 2016-11-09 DIAGNOSIS — I255 Ischemic cardiomyopathy: Secondary | ICD-10-CM | POA: Diagnosis not present

## 2016-11-09 DIAGNOSIS — M1990 Unspecified osteoarthritis, unspecified site: Secondary | ICD-10-CM | POA: Diagnosis not present

## 2016-11-09 NOTE — Telephone Encounter (Signed)
Routed to Dr. Fletcher Anon.  Medication list updated.

## 2016-11-09 NOTE — Telephone Encounter (Signed)
Katherine [name] pt niece calling to let us know pt is on 20 mg 1 a day of Lisinopril

## 2016-11-09 NOTE — Telephone Encounter (Signed)
Okay; thanks.

## 2016-11-09 NOTE — Patient Instructions (Signed)
Medication Instructions:  Your physician recommends that you continue on your current medications as directed. Please refer to the Current Medication list given to you today.   Labwork: none  Testing/Procedures: none  Follow-Up: Your physician wants you to follow-up in: 4 months with Dr. Fletcher Anon.  You will receive a reminder letter in the mail two months in advance. If you don't receive a letter, please call our office to schedule the follow-up appointment.   Any Other Special Instructions Will Be Listed Below (If Applicable). Please check to see if you are taking lisinopril and call us, [phone]     If you need a refill on your cardiac medications before your next appointment, please call your pharmacy.

## 2016-11-09 NOTE — Progress Notes (Signed)
Cardiology Office Note   Date:  11/09/2016   ID:  Katherine [name], DOB 1929-12-13, MRN [id number]  PCP:  Coral Spikes, DO  Cardiologist:   Kathlyn Sacramento, MD   Chief Complaint  Patient presents with  . other    3 month follow up. Pt. fell and broke her right ankle. Meds reviewed by the pt. verbally. Pt. c/o shortness of breath with little exertion.       History of Present Illness: Katherine [name] is a 83 y.o. female who presents for a follow-up visit regarding chronic systolic heart failure and coronary artery disease. She has a history of remote myocardial infarction, CHF, chronic kidney disease, and COPD.  She had a small non-ST elevation myocardial infarction in March 2017. Cardiac catheterization showed chronic total occlusion of the LAD with moderate left circumflex disease and otherwise minor irregularities. Fractional flow reserve was performed within the left circumflex and was low normal at 0.92. Medical therapy was recommended.  Echocardiogram in March 2017 showed an EF of 35-40% with akinesis of the mid to distal anteroseptal and anterior myocardium. ABI was done in May, 2017 for leg cramps and came back normal. She fell in January and fractured her right ankle. She underwent surgery and was sent to rehabilitation. The wound has been slow to heal but has improved significantly. She denies any chest pain or shortness of breath.   Past Medical History:  Diagnosis Date  . Arthritis   . CHF (congestive heart failure) ([place])   . Chicken pox   . Chronic combined systolic and diastolic heart failure ([place])    a. 10/2015 Echo: EF 35-40%, Akinesis of mid-apicalanteroseptal wall & AK of apex, Gr 1DD.  Marland Kitchen COPD (chronic obstructive pulmonary disease) ([place])    worked in a [employer]  . GERD (gastroesophageal reflux disease)   . Hyperlipidemia   . Hypertension   . Hypertensive heart disease   . Ischemic cardiomyopathy    a. 10/2015 Echo: EF 35-40%, Akinesis of mid-apicalanteroseptal wall &  AK of apex, Gr 1DD.  Marland Kitchen Occlusive coronary artery disease requiring drug therapy    a. Remote MI - never sought treatment when it occurred, shows up old on EKG;  b. Cath 10/2015 - 100% pLAD CTO with 80% D1, prox Cx 60% (FFR 0.92-->med Rx) - EF ~35%    Past Surgical History:  Procedure Laterality Date  . ABDOMINAL HYSTERECTOMY  1985  . AMPUTATION FINGER Left 1966   ring finger  . APPENDECTOMY  1961  . BACK SURGERY  2010   lower back  . CARDIAC CATHETERIZATION N/A 10/30/2015   Procedure: Right/Left Heart Cath and Coronary Angiography;  Surgeon: Wellington Hampshire, MD;  Location: [facility];  Service: Cardiovascular;  Laterality: N/A;  . CARDIAC CATHETERIZATION N/A 10/30/2015   Procedure: Intravascular Pressure Wire/FFR Study;  Surgeon: Wellington Hampshire, MD;  Location: [facility];  Service: Cardiovascular;  Laterality: N/A;  . ORIF ANKLE FRACTURE Right 08/24/2016   Procedure: OPEN REDUCTION INTERNAL FIXATION (ORIF) ANKLE FRACTURE;  Surgeon: Thornton Park, MD;  Location: [facility];  Service: Orthopedics;  Laterality: Right;  . SHOULDER SURGERY    . TONSILLECTOMY  1953  . TOTAL HIP ARTHROPLASTY Right 2012     Current Outpatient Prescriptions  Medication Sig Dispense Refill  . aspirin EC 81 MG tablet Take 81 mg by mouth daily.    Marland Kitchen atorvastatin (LIPITOR) 80 MG tablet Take 1 tablet (80 mg total) by mouth daily at 6  PM. 90 tablet 3  . bisacodyl (DULCOLAX) 10 MG suppository Place 1 suppository (10 mg total) rectally daily as needed for moderate constipation. 12 suppository 0  . carvedilol (COREG) 6.25 MG tablet Take 1 tablet (6.25 mg total) by mouth 2 (two) times daily with a meal. 180 tablet 3  . docusate sodium (COLACE) 100 MG capsule Take 1 capsule (100 mg total) by mouth 2 (two) times daily. 10 capsule 0  . ferrous sulfate 325 (65 FE) MG tablet Take 1 tablet (325 mg total) by mouth 3 (three) times daily after meals. 90 tablet 3  . furosemide (LASIX) 20 MG tablet Take 1 tablet (20  mg total) by mouth every other day. 30 tablet 3  . HYDROcodone-acetaminophen (NORCO/VICODIN) 5-325 MG tablet Take 1 tablet by mouth every 4 (four) hours as needed for moderate pain or severe pain. 30 tablet 0  . Lactobacillus-Inulin (Eagle) CAPS Take 1 capsule by mouth every morning. 30 capsule 0  . multivitamin-iron-minerals-folic acid (CENTRUM) chewable tablet Chew 1 tablet by mouth daily.    . nitroGLYCERIN (NITROSTAT) 0.4 MG SL tablet Place 1 tablet (0.4 mg total) under the tongue every 5 (five) minutes as needed for chest pain. 25 tablet 3  . omega-3 acid ethyl esters (LOVAZA) 1 g capsule Take 1 g by mouth daily.    . ondansetron (ZOFRAN) 4 MG tablet Take 1 tablet (4 mg total) by mouth daily as needed for nausea or vomiting. 14 tablet 0  . pantoprazole (PROTONIX) 40 MG tablet Take 1 tablet (40 mg total) by mouth daily. 90 tablet 2  . polyethylene glycol (MIRALAX / GLYCOLAX) packet Take 17 g by mouth daily as needed for mild constipation. 14 each 0  . pregabalin (LYRICA) 75 MG capsule Take 1 capsule (75 mg total) by mouth 2 (two) times daily. 180 capsule 0  . sertraline (ZOLOFT) 100 MG tablet Take 1 tablet (100 mg total) by mouth daily. 90 tablet 3  . thiamine (VITAMIN B-1) 100 MG tablet Take 1 tablet (100 mg total) by mouth daily. 90 tablet 1  . Vitamin D, Ergocalciferol, (DRISDOL) 50000 units CAPS capsule Take 50,000 Units by mouth every Wednesday.      No current facility-administered medications for this visit.     Allergies:   Patient has no known allergies.    Social History:  The patient  reports that she has never smoked. She has never used smokeless tobacco. She reports that she does not drink alcohol or use drugs.   Family History:  The patient's family history includes Arthritis in her sister and sister; Breast cancer in her sister; CAD in her brother and sister; Heart disease in her brother, brother, brother, brother, brother, brother, mother, sister,  sister, sister, and sister; Hyperlipidemia in her mother; Hypertension in her brother, brother, brother, brother, brother, brother, mother, sister, sister, sister, and sister; Lung cancer in her mother; Stroke in her mother, sister, sister, sister, and sister.    ROS:  Please see the history of present illness.   Otherwise, review of systems are positive for none.   All other systems are reviewed and negative.    PHYSICAL EXAM: VS:  BP 138/64 (BP Location: Left Arm, Patient Position: Sitting, Cuff Size: Normal)   Pulse (!) 54   Ht 5\' 1"  (1.549 m)   Wt 140 lb (63.5 kg)   BMI 26.45 kg/m  , BMI Body mass index is 26.45 kg/m. GEN: Well nourished, well developed, in no acute distress  HEENT:  normal  Neck: no JVD, carotid bruits, or masses Cardiac: RRR; no rubs, or gallops, . There is 1/ 6 systolic ejection murmur in the aortic area. +1 edema. Respiratory:  clear to auscultation bilaterally, normal work of breathing GI: soft, nontender, nondistended, + BS MS: no deformity or atrophy  Skin: warm and dry, no rash Neuro:  Strength and sensation are intact Psych: euthymic mood, full affect   EKG:  EKG is not ordered today.    Recent Labs: 10/27/2016: Magnesium 1.8 10/28/2016: ALT 25; BUN 11; Creatinine, Ser 0.82; Hemoglobin 12.2; Platelets 163; Potassium 3.9; Sodium 138    Lipid Panel    Component Value Date/Time   CHOL 114 12/16/2015 1422   TRIG 126 12/16/2015 1422   HDL 41 12/16/2015 1422   CHOLHDL 2.8 12/16/2015 1422   CHOLHDL 4.7 10/30/2015 0855   VLDL 28 10/30/2015 0855   LDLCALC 48 12/16/2015 1422      Wt Readings from Last 3 Encounters:  11/09/16 140 lb (63.5 kg)  10/28/16 140 lb (63.5 kg)  10/27/16 146 lb (66.2 kg)        ASSESSMENT AND PLAN:  1.  Chronic systolic heart failure: The patient appears to be euvolemic on small dose furosemide every other day.  She is not on spironolactone due to previous Hyperkalemia on higher dose lisinopril. I do not see  lisinopril and her medication list but she thinks that she has been taking it. She is going to call us back to confirm. Continue current dose of carvedilol.  2. Coronary artery disease involving native coronary arteries without angina: She has chronically occluded LAD with collaterals and moderate left circumflex stenosis which was not significant by FFR.  She has no anginal symptoms at the present time I recommend continuing medical therapy.  3. Essential hypertension:   Blood pressure is reasonably controlled. If she is taking lisinopril, I would consider increasing the dose.  4. Hyperlipidemia: Continue high dose atorvastatin. Most recent lipid profile showed an LDL of 48.   Disposition:   FU with me in 4 months.  Signed,  Kathlyn Sacramento, MD  11/09/2016 3:07 PM    [facility]

## 2016-11-10 DIAGNOSIS — J449 Chronic obstructive pulmonary disease, unspecified: Secondary | ICD-10-CM | POA: Diagnosis not present

## 2016-11-10 DIAGNOSIS — I5042 Chronic combined systolic (congestive) and diastolic (congestive) heart failure: Secondary | ICD-10-CM | POA: Diagnosis not present

## 2016-11-10 DIAGNOSIS — M1990 Unspecified osteoarthritis, unspecified site: Secondary | ICD-10-CM | POA: Diagnosis not present

## 2016-11-10 DIAGNOSIS — K219 Gastro-esophageal reflux disease without esophagitis: Secondary | ICD-10-CM | POA: Diagnosis not present

## 2016-11-10 DIAGNOSIS — I255 Ischemic cardiomyopathy: Secondary | ICD-10-CM | POA: Diagnosis not present

## 2016-11-10 DIAGNOSIS — I2510 Atherosclerotic heart disease of native coronary artery without angina pectoris: Secondary | ICD-10-CM | POA: Diagnosis not present

## 2016-11-10 DIAGNOSIS — E785 Hyperlipidemia, unspecified: Secondary | ICD-10-CM | POA: Diagnosis not present

## 2016-11-10 DIAGNOSIS — S82851D Displaced trimalleolar fracture of right lower leg, subsequent encounter for closed fracture with routine healing: Secondary | ICD-10-CM | POA: Diagnosis not present

## 2016-11-10 DIAGNOSIS — R69 Illness, unspecified: Secondary | ICD-10-CM | POA: Diagnosis not present

## 2016-11-10 DIAGNOSIS — I110 Hypertensive heart disease with heart failure: Secondary | ICD-10-CM | POA: Diagnosis not present

## 2016-11-12 ENCOUNTER — Encounter: Payer: Medicare HMO | Admitting: Surgery

## 2016-11-12 DIAGNOSIS — M84471G Pathological fracture, right ankle, subsequent encounter for fracture with delayed healing: Secondary | ICD-10-CM | POA: Diagnosis not present

## 2016-11-12 DIAGNOSIS — G629 Polyneuropathy, unspecified: Secondary | ICD-10-CM | POA: Diagnosis not present

## 2016-11-12 DIAGNOSIS — I890 Lymphedema, not elsewhere classified: Secondary | ICD-10-CM | POA: Diagnosis not present

## 2016-11-12 DIAGNOSIS — I110 Hypertensive heart disease with heart failure: Secondary | ICD-10-CM | POA: Diagnosis not present

## 2016-11-12 DIAGNOSIS — T8189XA Other complications of procedures, not elsewhere classified, initial encounter: Secondary | ICD-10-CM | POA: Diagnosis not present

## 2016-11-12 DIAGNOSIS — T8131XA Disruption of external operation (surgical) wound, not elsewhere classified, initial encounter: Secondary | ICD-10-CM | POA: Diagnosis not present

## 2016-11-12 DIAGNOSIS — I252 Old myocardial infarction: Secondary | ICD-10-CM | POA: Diagnosis not present

## 2016-11-12 DIAGNOSIS — L97311 Non-pressure chronic ulcer of right ankle limited to breakdown of skin: Secondary | ICD-10-CM | POA: Diagnosis not present

## 2016-11-12 DIAGNOSIS — M1990 Unspecified osteoarthritis, unspecified site: Secondary | ICD-10-CM | POA: Diagnosis not present

## 2016-11-12 DIAGNOSIS — Y839 Surgical procedure, unspecified as the cause of abnormal reaction of the patient, or of later complication, without mention of misadventure at the time of the procedure: Secondary | ICD-10-CM | POA: Diagnosis not present

## 2016-11-12 DIAGNOSIS — I509 Heart failure, unspecified: Secondary | ICD-10-CM | POA: Diagnosis not present

## 2016-11-13 NOTE — Progress Notes (Addendum)
Katherine [name] ([id number]) Visit Report for 11/12/2016 Arrival Information Details Patient Name: Katherine [name], Katherine I. Date of Service: 11/12/2016 3:00 PM Medical Record Number: [id number] Patient Account Number: [id number] Date of Birth/Sex: [birth year] (83 y.o. Female) Treating RN: Afful, RN, BSN, Velva Harman Primary Care Amalea Ottey: Thersa Salt Other Clinician: Referring Yader Criger: Thersa Salt Treating Keia Rask/Extender: Frann Rider in Treatment: 5 Visit Information History Since Last Visit All ordered tests and consults were completed: No Patient Arrived: Wheel Chair Added or deleted any medications: No Arrival Time: 15:17 Any new allergies or adverse reactions: No Accompanied By: niece Had a fall or experienced change in No activities of daily living that may affect Transfer Assistance: None risk of falls: Patient Identification Verified: Yes Signs or symptoms of abuse/neglect since last No Secondary Verification Process Yes visito Completed: Hospitalized since last visit: No Patient Requires Transmission-Based No Has Dressing in Place as Prescribed: Yes Precautions: Has Compression in Place as Prescribed: Yes Patient Has Alerts: No Pain Present Now: No Electronic Signature(s) Signed: 11/12/2016 3:17:50 PM By: Regan Lemming BSN, RN Entered By: Regan Lemming on 11/12/2016 15:17:50 [name] ([id number]) -------------------------------------------------------------------------------- Encounter Discharge Information Details Patient Name: Katherine Juba I. Date of Service: 11/12/2016 3:00 PM Medical Record Number: [id number] Patient Account Number: [id number] Date of Birth/Sex: 04-19-1930 (83 y.o. Female) Treating RN: Baruch Gouty, RN, BSN, Velva Harman Primary Care Jermy Couper: Thersa Salt Other Clinician: Referring Felicitas Sine: Thersa Salt Treating Zunairah Devers/Extender: Frann Rider in Treatment: 5 Encounter Discharge Information Items Discharge Pain Level: 0 Discharge Condition: Stable Ambulatory  Status: Wheelchair Discharge Destination: Home Transportation: Private Auto Accompanied By: niece Schedule Follow-up Appointment: No Medication Reconciliation completed and provided to Patient/Care No Traniyah Hallett: Provided on Clinical Summary of Care: 11/12/2016 Form Type Recipient Paper Patient DK Electronic Signature(s) Signed: 11/12/2016 3:51:39 PM By: Ruthine Dose Entered By: Ruthine Dose on 11/12/2016 15:51:39 Katherine Juba I. ([id number]) -------------------------------------------------------------------------------- Lower Extremity Assessment Details Patient Name: Katherine Juba I. Date of Service: 11/12/2016 3:00 PM Medical Record Number: [id number] Patient Account Number: [id number] Date of Birth/Sex: 03-Oct-1929 (83 y.o. Female) Treating RN: Afful, RN, BSN, [place] Primary Care Jermani Eberlein: Thersa Salt Other Clinician: Referring Ethelyne Erich: Thersa Salt Treating Edwardine Deschepper/Extender: Frann Rider in Treatment: 5 Edema Assessment Assessed: [Left: No] [Right: No] Edema: [Left: N] [Right: o] Calf Left: Right: Point of Measurement: 32 cm From Medial Instep cm 35.2 cm Ankle Left: Right: Point of Measurement: 7 cm From Medial Instep cm 17.5 cm Vascular Assessment Claudication: Claudication Assessment [Right:None] Pulses: Dorsalis Pedis Palpable: [Right:Yes] Posterior Tibial Extremity colors, hair growth, and conditions: Extremity Color: [Right:Mottled] Hair Growth on Extremity: [Right:Yes] Temperature of Extremity: [Right:Warm] Capillary Refill: [Right:< 3 seconds] Toe Nail Assessment Left: Right: Thick: Yes Discolored: Yes Deformed: No Improper Length and Hygiene: No Electronic Signature(s) Signed: 11/12/2016 4:03:35 PM By: Regan Lemming BSN, RN Previous Signature: 11/12/2016 3:18:28 PM Version By: Regan Lemming BSN, RN Murphy, Enyla I. ([id number]) Entered By: Regan Lemming on 11/12/2016 16:03:35 Katherine Juba IMarland Kitchen  ([id number]) -------------------------------------------------------------------------------- Multi Wound Chart Details Patient Name: Katherine Juba I. Date of Service: 11/12/2016 3:00 PM Medical Record Number: [id number] Patient Account Number: [id number] Date of Birth/Sex: [birth year] (83 y.o. Female) Treating RN: Baruch Gouty, RN, BSN, Velva Harman Primary Care Markiah Janeway: Thersa Salt Other Clinician: Referring Shriyans Kuenzi: Thersa Salt Treating Anajulia Leyendecker/Extender: Frann Rider in Treatment: 5 Vital Signs Height(in): 61 Pulse(bpm): 54 Weight(lbs): 140 Blood Pressure 138/63 (mmHg): Body Mass Index(BMI): 26 Temperature(F): 98.0 Respiratory Rate 16 (breaths/min): Photos: [1:No Photos] [N/A:N/A] Wound Location: [1:Right Lower Leg - Lateral] [N/A:N/A] Wounding Event: [1:Surgical Injury] [N/A:N/A] Primary  Etiology: [1:Open Surgical Wound] [N/A:N/A] Comorbid History: [1:Congestive Heart Failure, Hypertension, Myocardial Infarction, Osteoarthritis, Neuropathy] [N/A:N/A] Date Acquired: [1:08/24/2016] [N/A:N/A] Weeks of Treatment: [1:5] [N/A:N/A] Wound Status: [1:Open] [N/A:N/A] Measurements L x W x D 1x0.4x0.2 [N/A:N/A] (cm) Area (cm) : [1:0.314] [N/A:N/A] Volume (cm) : [1:0.063] [N/A:N/A] % Reduction in Area: [1:83.30%] [N/A:N/A] % Reduction in Volume: 91.60% [N/A:N/A] Classification: [1:Partial Thickness] [N/A:N/A] Exudate Amount: [1:Small] [N/A:N/A] Exudate Type: [1:Serous] [N/A:N/A] Exudate Color: [1:amber] [N/A:N/A] Wound Margin: [1:Distinct, outline attached] [N/A:N/A] Granulation Amount: [1:Large (67-100%)] [N/A:N/A] Granulation Quality: [1:Pale, Friable] [N/A:N/A] Necrotic Amount: [1:Small (1-33%)] [N/A:N/A] Exposed Structures: [1:Fat Layer (Subcutaneous Tissue) Exposed: Yes] [N/A:N/A] Epithelialization: [1:Medium (34-66%)] [N/A:N/A] Periwound Skin Texture: Excoriation: No [1:Induration: No] [N/A:N/A] Callus: No Crepitus: No Rash: No Scarring: No Periwound Skin Maceration: No N/A  N/A Moisture: Dry/Scaly: No Periwound Skin Color: Atrophie Blanche: No N/A N/A Cyanosis: No Ecchymosis: No Erythema: No Hemosiderin Staining: No Mottled: No Pallor: No Rubor: No Temperature: No Abnormality N/A N/A Tenderness on Yes N/A N/A Palpation: Wound Preparation: Ulcer Cleansing: N/A N/A Rinsed/Irrigated with Saline, Other: surg scrub and water Topical Anesthetic Applied: Other: lidocaine 4% Treatment Notes Electronic Signature(s) Signed: 11/12/2016 3:47:17 PM By: Christin Fudge MD, FACS Entered By: Christin Fudge on 11/12/2016 15:47:17 [name] ([id number]) -------------------------------------------------------------------------------- [place] Details Patient Name: Katherine Juba I. Date of Service: 11/12/2016 3:00 PM Medical Record Number: [id number] Patient Account Number: [id number] Date of Birth/Sex: Jul 06, 1930 (83 y.o. Female) Treating RN: Afful, RN, BSN, Velva Harman Primary Care Klaira Pesci: Thersa Salt Other Clinician: Referring Alfonso Shackett: Thersa Salt Treating Umaima Scholten/Extender: Frann Rider in Treatment: 5 Active Inactive ` Abuse / Safety / Falls / Self Care Management Nursing Diagnoses: Potential for falls Goals: Patient will remain injury free Date Initiated: 10/07/2016 Target Resolution Date: 12/18/2016 Goal Status: Active Interventions: Assess fall risk on admission and as needed Assess impairment of mobility on admission and as needed per policy Notes: ` Nutrition Nursing Diagnoses: Imbalanced nutrition Goals: Patient/caregiver agrees to and verbalizes understanding of need to use nutritional supplements and/or vitamins as prescribed Date Initiated: 10/07/2016 Target Resolution Date: 12/18/2016 Goal Status: Active Interventions: Assess patient nutrition upon admission and as needed per policy Notes: ` Orientation to the Wound Care Program Nursing Diagnoses: ZOANNE, NEWILL ([id number]) Knowledge deficit related to the  wound healing center program Goals: Patient/caregiver will verbalize understanding of the [facility] Date Initiated: 10/07/2016 Target Resolution Date: 10/23/2016 Goal Status: Active Interventions: Provide education on orientation to the wound center Notes: ` Pain, Acute or Chronic Nursing Diagnoses: Pain, acute or chronic: actual or potential Potential alteration in comfort, pain Goals: Patient/caregiver will verbalize adequate pain control between visits Date Initiated: 10/07/2016 Target Resolution Date: 12/18/2016 Goal Status: Active Interventions: Assess comfort goal upon admission Complete pain assessment as per visit requirements Notes: ` Wound/Skin Impairment Nursing Diagnoses: Impaired tissue integrity Knowledge deficit related to smoking impact on wound healing Knowledge deficit related to ulceration/compromised skin integrity Goals: Ulcer/skin breakdown will have a volume reduction of 80% by week 12 Date Initiated: 10/07/2016 Target Resolution Date: 12/11/2016 Goal Status: Active Interventions: Assess patient/caregiver ability to perform ulcer/skin care regimen upon admission and as needed Assess ulceration(s) every visit MAYUKHA, SYMMONDS ([id number]) Notes: Electronic Signature(s) Signed: 11/12/2016 4:21:12 PM By: Regan Lemming BSN, RN Entered By: Regan Lemming on 11/12/2016 15:35:34 Katherine Juba IMarland Kitchen ([id number]) -------------------------------------------------------------------------------- Pain Assessment Details Patient Name: Katherine Juba I. Date of Service: 11/12/2016 3:00 PM Medical Record Number: [id number] Patient Account Number: [id number] Date of Birth/Sex: 05/01/1930 (83 y.o. Female) Treating RN: Afful,  RN, BSN, Velva Harman Primary Care Rosbel Buckner: Thersa Salt Other Clinician: Referring Winola Drum: Thersa Salt Treating Davelle Anselmi/Extender: Frann Rider in Treatment: 5 Active Problems Location of Pain Severity and Description of Pain Patient Has  Paino No Site Locations With Dressing Change: No Pain Management and Medication Current Pain Management: Electronic Signature(s) Signed: 11/12/2016 3:17:57 PM By: Regan Lemming BSN, RN Entered By: Regan Lemming on 11/12/2016 15:17:57 [name] ([id number]) -------------------------------------------------------------------------------- Patient/Caregiver Education Details Patient Name: Katherine Juba I. Date of Service: 11/12/2016 3:00 PM Medical Record Number: [id number] Patient Account Number: [id number] Date of Birth/Gender: June 20, 1930 (83 y.o. Female) Treating RN: Afful, RN, BSN, Velva Harman Primary Care Physician: Thersa Salt Other Clinician: Referring Physician: Thersa Salt Treating Physician/Extender: Frann Rider in Treatment: 5 Education Assessment Education Provided To: Patient Education Topics Provided Welcome To [facility]: Methods: Explain/Verbal Responses: State content correctly Electronic Signature(s) Signed: 11/12/2016 4:21:12 PM By: Regan Lemming BSN, RN Entered By: Regan Lemming on 11/12/2016 15:55:22 Katherine Juba IMarland Kitchen ([id number]) -------------------------------------------------------------------------------- Wound Assessment Details Patient Name: Katherine Juba I. Date of Service: 11/12/2016 3:00 PM Medical Record Number: [id number] Patient Account Number: [id number] Date of Birth/Sex: September 20, 1929 (83 y.o. Female) Treating RN: Afful, RN, BSN, [place] Primary Care Etan Vasudevan: Thersa Salt Other Clinician: Referring Lindzy Rupert: Thersa Salt Treating Conlee Sliter/Extender: Frann Rider in Treatment: 5 Wound Status Wound Number: 1 Primary Open Surgical Wound Etiology: Wound Location: Right Lower Leg - Lateral Wound Open Wounding Event: Surgical Injury Status: Date Acquired: 08/24/2016 Comorbid Congestive Heart Failure, Weeks Of Treatment: 5 History: Hypertension, Myocardial Infarction, Clustered Wound: No Osteoarthritis, Neuropathy Photos Photo Uploaded By: Regan Lemming on 11/12/2016 16:18:45 Wound Measurements Length: (cm) 1 Width: (cm) 0.4 Depth: (cm) 0.2 Area: (cm) 0.314 Volume: (cm) 0.063 % Reduction in Area: 83.3% % Reduction in Volume: 91.6% Epithelialization: Medium (34-66%) Tunneling: No Undermining: No Wound Description Classification: Partial Thickness Foul Odor Aft Wound Margin: Distinct, outline attached Slough/Fibrin Exudate Amount: Small Exudate Type: Serous Exudate Color: amber er Cleansing: No o Yes Wound Bed Granulation Amount: Large (67-100%) Exposed Structure Granulation Quality: Pale, Friable Fat Layer (Subcutaneous Tissue) Exposed: Yes Necrotic Amount: Small (1-33%) Necrotic Quality: Adherent [place] I. ([id number]) Periwound Skin Texture Texture Color No Abnormalities Noted: No No Abnormalities Noted: No Callus: No Atrophie Blanche: No Crepitus: No Cyanosis: No Excoriation: No Ecchymosis: No Induration: No Erythema: No Rash: No Hemosiderin Staining: No Scarring: No Mottled: No Pallor: No Moisture Rubor: No No Abnormalities Noted: No Dry / Scaly: No Temperature / Pain Maceration: No Temperature: No Abnormality Tenderness on Palpation: Yes Wound Preparation Ulcer Cleansing: Rinsed/Irrigated with Saline, Other: surg scrub and water, Topical Anesthetic Applied: Other: lidocaine 4%, Treatment Notes Wound #1 (Right, Lateral Lower Leg) 1. Cleansed with: Cleanse wound with antibacterial soap and water 4. Dressing Applied: Aquacel Ag 5. Secondary Dressing Applied ABD Pad 7. Secured with 3 Layer Compression System - Right Lower Extremity Notes unna to anchor Electronic Signature(s) Signed: 11/12/2016 4:21:12 PM By: Regan Lemming BSN, RN Entered By: Regan Lemming on 11/12/2016 15:31:09 Katherine Juba IMarland Kitchen ([id number]) -------------------------------------------------------------------------------- Vitals Details Patient Name: Katherine Juba I. Date of Service: 11/12/2016 3:00 PM Medical  Record Number: [id number] Patient Account Number: [id number] Date of Birth/Sex: [birth year] (83 y.o. Female) Treating RN: Afful, RN, BSN, [place] Primary Care Sayre Witherington: Thersa Salt Other Clinician: Referring Zyron Deeley: Thersa Salt Treating Nyhla Mountjoy/Extender: Frann Rider in Treatment: 5 Vital Signs Time Taken: 15:18 Temperature (F): 98.0 Height (in): 61 Pulse (bpm): 54 Weight (lbs): 140 Respiratory Rate (breaths/min): 16 Body Mass Index (BMI): 26.4  Blood Pressure (mmHg): 138/63 Reference Range: 80 - 120 mg / dl Electronic Signature(s) Signed: 11/12/2016 4:21:12 PM By: Regan Lemming BSN, RN Entered By: Regan Lemming on 11/12/2016 15:22:51

## 2016-11-13 NOTE — Progress Notes (Signed)
JAMICIA, HAALAND ([id number]) Visit Report for 11/12/2016 Chief Complaint Document Details Patient Name: FRANCES, AMBROSINO I. Date of Service: 11/12/2016 3:00 PM Medical Record Number: [id number] Patient Account Number: [id number] Date of Birth/Sex: [birth year] (83 y.o. Female) Treating RN: Baruch Gouty, RN, BSN, Velva Harman Primary Care Provider: Thersa Salt Other Clinician: Referring Provider: Thersa Salt Treating Provider/Extender: Frann Rider in Treatment: 5 Information Obtained from: Patient Chief Complaint Patient presents to the wound care center with open non-healing surgical wound(s) of the right ankle after a trimalleolar fracture was repaired ion 9th January 2018 Electronic Signature(s) Signed: 11/12/2016 3:47:23 PM By: Christin Fudge MD, FACS Entered By: Christin Fudge on 11/12/2016 15:47:23 [name] ([id number]) -------------------------------------------------------------------------------- HPI Details Patient Name: [name] Date of Service: 11/12/2016 3:00 PM Medical Record Number: [id number] Patient Account Number: [id number] Date of Birth/Sex: 1930/02/12 (83 y.o. Female) Treating RN: Baruch Gouty, RN, BSN, Velva Harman Primary Care Provider: Thersa Salt Other Clinician: Referring Provider: Thersa Salt Treating Provider/Extender: Frann Rider in Treatment: 5 History of Present Illness Location: right lateral ankle Quality: Patient reports experiencing a dull pain to affected area(s). Severity: Patient states wound (s) are getting better. Duration: Patient has had the wound for < 6 weeks prior to presenting for treatment Timing: Pain in wound is constant (hurts all the time) Context: The wound appeared gradually over time Modifying Factors: Other treatment(s) tried include:nonweightbearing boot Associated Signs and Symptoms: Patient reports having increase swelling. HPI Description: 83 year old patient who has recently been seen several times at the orthopedic office for problems  with her wound healing after she had a right ankle trimalleolar fracture repaired on 08/24/2016. He simply did notice some drainage from the incision and she was reviewed in the orthopedic office on 10/01/2016 and started on Keflex and Septra. She was sent to [place] for an opinion is also than be following up with the orthopedic physician. Most recent x-rays taken of the orthopedic office were within normal limits. The patient does not have any significant history of diabetes mellitus or smoking but has had edema problems in the legs and does say that she's seen a vein surgeon in the past. All this is mainly done in [place], [place] where she normally resides. Electronic Signature(s) Signed: 11/12/2016 3:47:28 PM By: Christin Fudge MD, FACS Entered By: Christin Fudge on 11/12/2016 15:47:28 [name] ([id number]) -------------------------------------------------------------------------------- Physical Exam Details Patient Name: [name] Date of Service: 11/12/2016 3:00 PM Medical Record Number: [id number] Patient Account Number: [id number] Date of Birth/Sex: 1930-05-11 (83 y.o. Female) Treating RN: Baruch Gouty, RN, BSN, Velva Harman Primary Care Provider: Thersa Salt Other Clinician: Referring Provider: Thersa Salt Treating Provider/Extender: Frann Rider in Treatment: 5 Constitutional . Pulse regular. Respirations normal and unlabored. Afebrile. . Eyes Nonicteric. Reactive to light. Ears, Nose, Mouth, and Throat Lips, teeth, and gums WNL.Marland Kitchen Moist mucosa without lesions. Neck supple and nontender. No palpable supraclavicular or cervical adenopathy. Normal sized without goiter. Respiratory WNL. No retractions.. Breath sounds WNL, No rubs, rales, rhonchi, or wheeze.. Cardiovascular Heart rhythm and rate regular, no murmur or gallop.. Pedal Pulses WNL. No clubbing, cyanosis or edema. Chest Breasts symmetical and no nipple discharge.. Breast tissue WNL, no masses, lumps, or  tenderness.. Lymphatic No adneopathy. No adenopathy. No adenopathy. Musculoskeletal Adexa without tenderness or enlargement.. Digits and nails w/o clubbing, cyanosis, infection, petechiae, ischemia, or inflammatory conditions.. Integumentary (Hair, Skin) No suspicious lesions. No crepitus or fluctuance. No peri-wound warmth or erythema. No masses.Marland Kitchen Psychiatric Judgement and insight Intact.. No evidence of depression, anxiety, or agitation.. Notes  the wound is looking very good today and no sharp debridement is required. Her lymphedema has gone down very nicely Engineer, maintenance) Signed: 11/12/2016 3:47:46 PM By: Christin Fudge MD, FACS Entered By: Christin Fudge on 11/12/2016 15:47:46 [name] ([id number]) -------------------------------------------------------------------------------- Physician Orders Details Patient Name: [name] Date of Service: 11/12/2016 3:00 PM Medical Record Number: [id number] Patient Account Number: [id number] Date of Birth/Sex: 1929/11/02 (83 y.o. Female) Treating RN: Afful, RN, BSN, [place] Primary Care Provider: Thersa Salt Other Clinician: Referring Provider: Thersa Salt Treating Provider/Extender: Frann Rider in Treatment: 5 Verbal / Phone Orders: No Diagnosis Coding Wound Cleansing Wound #1 Right,Lateral Lower Leg o Cleanse wound with mild soap and water - in clinic Anesthetic Wound #1 Right,Lateral Lower Leg o Topical Lidocaine 4% cream applied to wound bed prior to debridement - for clinic use Skin Barriers/Peri-Wound Care Wound #1 Right,Lateral Lower Leg o Moisturizing lotion Primary Wound Dressing Wound #1 Right,Lateral Lower Leg o Aquacel Ag Secondary Dressing Wound #1 Right,Lateral Lower Leg o Gauze, ABD and Kerlix/Conform Dressing Change Frequency Wound #1 Right,Lateral Lower Leg o Change dressing every week - pt to get changed at wound care center next week Follow-up Appointments Wound #1 Right,Lateral  Lower Leg o Return Appointment in 1 week. Edema Control Wound #1 Right,Lateral Lower Leg o 3 Layer Compression System - Right Lower Extremity - unna to anchor o Elevate legs to the level of the heart and pump ankles as often as possible Additional Orders / Instructions ADRINA, ARMIJO I. ([id number]) Wound #1 Right,Lateral Lower Leg o Increase protein intake. Home Health Wound #1 [place] Visits - [place] Nurse may visit PRN to address patientos wound care needs. o FACE TO FACE ENCOUNTER: MEDICARE and MEDICAID PATIENTS: I certify that this patient is under my care and that I had a face-to-face encounter that meets the physician face-to-face encounter requirements with this patient on this date. The encounter with the patient was in whole or in part for the following MEDICAL CONDITION: (primary reason for [place]) MEDICAL NECESSITY: I certify, that based on my findings, NURSING services are a medically necessary home health service. HOME BOUND STATUS: I certify that my clinical findings support that this patient is homebound (i.e., Due to illness or injury, pt requires aid of supportive devices such as crutches, cane, wheelchairs, walkers, the use of special transportation or the assistance of another person to leave their place of residence. There is a normal inability to leave the home and doing so requires considerable and taxing effort. Other absences are for medical reasons / religious services and are infrequent or of short duration when for other reasons). o If current dressing causes regression in wound condition, may D/C ordered dressing product/s and apply Normal Saline Moist Dressing daily until next [place] / Other MD appointment. Elizabeth of regression in wound condition at [phone]. o Please direct any NON-WOUND related issues/requests for orders to patient's Primary  Care Physician Electronic Signature(s) Signed: 11/12/2016 4:07:16 PM By: Christin Fudge MD, FACS Signed: 11/12/2016 4:21:12 PM By: Regan Lemming BSN, RN Entered By: Regan Lemming on 11/12/2016 15:38:33 Peggyann Juba IMarland Kitchen ([id number]) -------------------------------------------------------------------------------- Problem List Details Patient Name: [name] Date of Service: 11/12/2016 3:00 PM Medical Record Number: [id number] Patient Account Number: [id number] Date of Birth/Sex: June 28, 1930 (83 y.o. 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 ([id number]) -------------------------------------------------------------------------------- Progress Note Details Patient Name: [name] Date of Service: 11/12/2016 3:00 PM Medical Record Number: [id number] Patient Account Number: [id number] Date of Birth/Sex: [birth year] (83 y.o. Female) Treating RN: Afful, RN, BSN, Velva Harman Primary Care Provider: Thersa Salt Other Clinician: Referring Provider: Thersa Salt Treating Provider/Extender: Frann Rider in Treatment: 5 Subjective Chief Complaint Information obtained from Patient Patient presents to the wound care center  with open non-healing surgical wound(s) of the right ankle after a trimalleolar fracture was repaired ion 9th January 2018 History of Present Illness (HPI) The following HPI elements were documented for the patient's wound: Location: right lateral ankle Quality: Patient reports experiencing a dull pain to affected area(s). Severity: Patient states wound (s) are getting better. Duration: Patient has had the wound for < 6 weeks prior to presenting for treatment Timing: Pain in wound is constant (hurts all the time) Context: The wound appeared gradually over time Modifying Factors: Other treatment(s) tried include:nonweightbearing boot Associated Signs and Symptoms: Patient reports having increase swelling. 83 year old patient who has recently been seen several times at the orthopedic office for problems with her wound healing after she had a right ankle trimalleolar fracture repaired on 08/24/2016. He simply did notice some drainage from the incision and she was reviewed in the orthopedic office on 10/01/2016 and started on Keflex and Septra. She was sent to [place] for an opinion is also than be following up with the orthopedic physician. Most recent x-rays taken of the orthopedic office were within normal limits. The patient does not have any significant history of diabetes mellitus or smoking but has had edema problems in the legs and does say that she's seen a vein surgeon in the past. All this is mainly done in [place], [place] where she normally resides. Objective Constitutional Pulse regular. Respirations normal and unlabored. Afebrile. Vitals Time Taken: 3:18 PM, Height: 61 in, Weight: 140 lbs, BMI: 26.4, Temperature: 98.0 F, Pulse: 54 bpm, Respiratory Rate: 16 breaths/min, Blood Pressure: 138/63 mmHg. [place], [place] I. ([id number]) Eyes Nonicteric. Reactive to light. Ears, Nose, Mouth, and Throat Lips, teeth, and gums WNL.Marland Kitchen Moist mucosa without lesions. Neck supple and nontender. No  palpable supraclavicular or cervical adenopathy. Normal sized without goiter. Respiratory WNL. No retractions.. Breath sounds WNL, No rubs, rales, rhonchi, or wheeze.. Cardiovascular Heart rhythm and rate regular, no murmur or gallop.. Pedal Pulses WNL. No clubbing, cyanosis or edema. Chest Breasts symmetical and no nipple discharge.. Breast tissue WNL, no masses, lumps, or tenderness.. Lymphatic No adneopathy. No adenopathy. No adenopathy. Musculoskeletal Adexa without tenderness or enlargement.. Digits and nails w/o clubbing, cyanosis, infection, petechiae, ischemia, or inflammatory conditions.Marland Kitchen Psychiatric Judgement and insight Intact.. No evidence of depression, anxiety, or agitation.. General Notes: the wound is looking very good today and no sharp debridement is required. Her lymphedema has gone down very nicely Integumentary (Hair, Skin) No suspicious lesions. No crepitus or fluctuance. No peri-wound warmth or erythema. No masses.. Wound #1 status is Open. Original cause of wound was Surgical Injury. The wound  is located on the Right,Lateral Lower Leg. The wound measures 1cm length x 0.4cm width x 0.2cm depth; 0.314cm^2 area and 0.063cm^3 volume. There is Fat Layer (Subcutaneous Tissue) Exposed exposed. There is no tunneling or undermining noted. There is a small amount of serous drainage noted. The wound margin is distinct with the outline attached to the wound base. There is large (67-100%) pale, friable granulation within the wound bed. There is a small (1-33%) amount of necrotic tissue within the wound bed including Adherent Slough. The periwound skin appearance did not exhibit: Callus, Crepitus, Excoriation, Induration, Rash, Scarring, Dry/Scaly, Maceration, Atrophie Blanche, Cyanosis, Ecchymosis, Hemosiderin Staining, Mottled, Pallor, Rubor, Erythema. Periwound temperature was noted as No Abnormality. The periwound has tenderness on palpation. Assessment AMELA, HANDLEY I.  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 I89.0 - Lymphedema, not elsewhere classified M84.471G - Pathological fracture, right ankle, subsequent encounter for fracture with delayed healing Plan Wound Cleansing: Wound #1 Right,Lateral Lower Leg: Cleanse wound with mild soap and water - in clinic Anesthetic: Wound #1 Right,Lateral Lower Leg: Topical Lidocaine 4% cream applied to wound bed prior to debridement - for clinic use Skin Barriers/Peri-Wound Care: Wound #1 Right,Lateral Lower Leg: Moisturizing lotion Primary Wound Dressing: Wound #1 Right,Lateral Lower Leg: Aquacel Ag Secondary Dressing: Wound #1 Right,Lateral Lower Leg: Gauze, ABD and Kerlix/Conform Dressing Change Frequency: Wound #1 Right,Lateral Lower Leg: Change dressing every week - pt to get changed at wound care center next week Follow-up Appointments: Wound #1 Right,Lateral Lower Leg: Return Appointment in 1 week. Edema Control: Wound #1 Right,Lateral Lower Leg: 3 Layer Compression System - Right Lower Extremity - unna to anchor Elevate legs to the level of the heart and pump ankles as often as possible Additional Orders / Instructions: Wound #1 Right,Lateral Lower Leg: Increase protein intake. Home Health: Wound #1 Right,Lateral Lower Leg: Continue Home Health Visits - [place] Nurse may visit PRN to address patient s wound care needs. FACE TO FACE ENCOUNTER: MEDICARE and MEDICAID PATIENTS: I certify that this patient is under my care and that I had a face-to-face encounter that meets the physician face-to-face encounter MILKA, WINDHOLZ I. ([id number]) requirements with this patient on this date. The encounter with the patient was in whole or in part for the following MEDICAL CONDITION: (primary reason for [place])  MEDICAL NECESSITY: I certify, that based on my findings, NURSING services are a medically necessary home health service. HOME BOUND STATUS: I certify that my clinical findings support that this patient is homebound (i.e., Due to illness or injury, pt requires aid of supportive devices such as crutches, cane, wheelchairs, walkers, the use of special transportation or the assistance of another person to leave their place of residence. There is a normal inability to leave the home and doing so requires considerable and taxing effort. Other absences are for medical reasons / religious services and are infrequent or of short duration when for other reasons). If current dressing causes regression in wound condition, may D/C ordered dressing product/s and apply Normal Saline Moist Dressing daily until next [place] / Other MD appointment. [place] of regression in wound condition at [phone]. Please direct any NON-WOUND related issues/requests for orders to patient's Primary Care Physician after review I have recommended: 1. Silver alginate to be covered with Drawtex and some gauze padding and wrapped with a Profore lite  2. Elevation has been recommended 3. Will order some juxta light stockings which she will be able to use once her wound is healed. 4. Phisical Therapy as per the orthopedic physician's orders 5. review next weeks after the orthopedic opinion Electronic Signature(s) Signed: 11/12/2016 3:48:32 PM By: Christin Fudge MD, FACS Entered By: Christin Fudge on 11/12/2016 15:48:32 [name] ([id number]) -------------------------------------------------------------------------------- [place] Details Patient Name: [name] Date of Service: 11/12/2016 Medical Record Number: [id number] Patient Account Number: [id number] Date of Birth/Sex: 1929-12-11 (83 y.o. 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 I89.0 Lymphedema, not elsewhere classified M84.471G Pathological fracture, right ankle, subsequent encounter for fracture with delayed healing Facility Procedures CPT4: Description Modifier Quantity Code [id number] (Facility Use Only) [phone] - [facility] RT 1 LEG Physician Procedures CPT4: Description Modifier Quantity Code [id number] 99213 - WC PHYS LEVEL 3 - EST PT 1 ICD-10 Description Diagnosis T81.31XA Disruption of external operation (surgical) wound, not elsewhere classified, initial encounter I89.0 Lymphedema, not elsewhere  classified 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

## 2016-11-16 ENCOUNTER — Encounter: Payer: Self-pay | Admitting: Family Medicine

## 2016-11-16 ENCOUNTER — Ambulatory Visit (INDEPENDENT_AMBULATORY_CARE_PROVIDER_SITE_OTHER): Payer: Medicare HMO | Admitting: Family Medicine

## 2016-11-16 VITALS — BP 160/70 | HR 52 | Temp 98.3°F | Resp 17 | Ht 61.0 in | Wt 119.2 lb

## 2016-11-16 DIAGNOSIS — I10 Essential (primary) hypertension: Secondary | ICD-10-CM

## 2016-11-16 DIAGNOSIS — E785 Hyperlipidemia, unspecified: Secondary | ICD-10-CM

## 2016-11-16 DIAGNOSIS — I2510 Atherosclerotic heart disease of native coronary artery without angina pectoris: Secondary | ICD-10-CM | POA: Diagnosis not present

## 2016-11-16 DIAGNOSIS — I5042 Chronic combined systolic (congestive) and diastolic (congestive) heart failure: Secondary | ICD-10-CM

## 2016-11-16 DIAGNOSIS — S82891S Other fracture of right lower leg, sequela: Secondary | ICD-10-CM | POA: Diagnosis not present

## 2016-11-16 MED ORDER — ATORVASTATIN CALCIUM 80 MG PO TABS: 80.0000 mg | ORAL_TABLET | Freq: Every day | ORAL | 3 refills | Status: AC

## 2016-11-16 MED ORDER — PREGABALIN 75 MG PO CAPS: 75.0000 mg | ORAL_CAPSULE | Freq: Two times a day (BID) | ORAL | 3 refills | Status: DC

## 2016-11-16 MED ORDER — FUROSEMIDE 20 MG PO TABS: 20.0000 mg | ORAL_TABLET | ORAL | 3 refills | Status: AC

## 2016-11-16 NOTE — Patient Instructions (Signed)
Continue your meds.  Follow up in 6 months.  Take care  Dr. Shianne Zeiser  

## 2016-11-16 NOTE — Progress Notes (Signed)
Pre visit review using our clinic review tool, if applicable. No additional management support is needed unless otherwise documented below in the visit note. 

## 2016-11-17 DIAGNOSIS — I255 Ischemic cardiomyopathy: Secondary | ICD-10-CM | POA: Diagnosis not present

## 2016-11-17 DIAGNOSIS — J449 Chronic obstructive pulmonary disease, unspecified: Secondary | ICD-10-CM | POA: Diagnosis not present

## 2016-11-17 DIAGNOSIS — M1990 Unspecified osteoarthritis, unspecified site: Secondary | ICD-10-CM | POA: Diagnosis not present

## 2016-11-17 DIAGNOSIS — I5042 Chronic combined systolic (congestive) and diastolic (congestive) heart failure: Secondary | ICD-10-CM | POA: Diagnosis not present

## 2016-11-17 DIAGNOSIS — K219 Gastro-esophageal reflux disease without esophagitis: Secondary | ICD-10-CM | POA: Diagnosis not present

## 2016-11-17 DIAGNOSIS — E785 Hyperlipidemia, unspecified: Secondary | ICD-10-CM | POA: Diagnosis not present

## 2016-11-17 DIAGNOSIS — I2510 Atherosclerotic heart disease of native coronary artery without angina pectoris: Secondary | ICD-10-CM | POA: Diagnosis not present

## 2016-11-17 DIAGNOSIS — S82851D Displaced trimalleolar fracture of right lower leg, subsequent encounter for closed fracture with routine healing: Secondary | ICD-10-CM | POA: Diagnosis not present

## 2016-11-17 DIAGNOSIS — I110 Hypertensive heart disease with heart failure: Secondary | ICD-10-CM | POA: Diagnosis not present

## 2016-11-17 DIAGNOSIS — R69 Illness, unspecified: Secondary | ICD-10-CM | POA: Diagnosis not present

## 2016-11-17 NOTE — Assessment & Plan Note (Signed)
Euvolemic. Continue current medications 

## 2016-11-17 NOTE — Assessment & Plan Note (Signed)
Has been stable. Unsure of why BP is up today. Continue Coreg, Lisinopril and Lasix.

## 2016-11-17 NOTE — Assessment & Plan Note (Signed)
At goal on lipitor. 

## 2016-11-17 NOTE — Assessment & Plan Note (Signed)
Doing well following rehab.

## 2016-11-17 NOTE — Assessment & Plan Note (Signed)
Stable.  Currently asymptomatic. ?

## 2016-11-17 NOTE — Progress Notes (Signed)
Subjective:  Patient ID: Katherine [name], female    DOB: 11-13-1929  Age: 83 y.o. MRN: [id number]  CC: Follow up  HPI:  83 year old female with CAD, ischemic cardiomyopathy/CHF, COPD, hypertension, hyperlipidemia presents for follow-up.  Recent ankle fracture  Currently in a cam walker.  Doing well s/p rehab stay.  HTN  Has been well controlled.  BP elevated today (was well controlled at recent cardiology visit).  Compliant with Coreg, Lisinopril, Lasix.  HLD  At goal on lipitor.  CAD/CHF  Stable on aspirin, lipitor, coreg, lasix, lisinopril.    Social Hx   Social History   Social History  . Marital status: Single    Spouse name: N/A  . Number of children: N/A  . Years of education: N/A   Social History Main Topics  . Smoking status: Never Smoker  . Smokeless tobacco: Never Used  . Alcohol use No  . Drug use: No  . Sexual activity: No   Other Topics Concern  . None   Social History Narrative   Patient lives in [place] but visits her family in [place] frequently. She is very active and walks often.    Review of Systems  Respiratory: Negative.   Cardiovascular: Negative.    Objective:  BP (!) 160/70   Pulse (!) 52   Temp 98.3 F (36.8 C) (Oral)   Resp 17   Ht 5\' 1"  (1.549 m)   Wt 119 lb 4 oz (54.1 kg) Comment: Weighed with boot on right foot  SpO2 96%   BMI 22.53 kg/m   BP/Weight 11/16/2016 11/09/2016 1/74/9449  Systolic BP 675 916 384  Diastolic BP 70 64 78  Wt. (Lbs) 119.25 140 140  BMI 22.53 26.45 26.45   Physical Exam  Constitutional: She is oriented to person, place, and time. She appears well-developed. No distress.  Cardiovascular: Normal rate and regular rhythm.   Pulmonary/Chest: Effort normal and breath sounds normal.  Neurological: She is alert and oriented to person, place, and time.  Psychiatric: She has a normal mood and affect.  Vitals reviewed.   Lab Results  Component Value Date   WBC 12.0 (H) 10/28/2016   HGB  12.2 10/28/2016   HCT 36.8 10/28/2016   PLT 163 10/28/2016   GLUCOSE 117 (H) 10/28/2016   CHOL 114 12/16/2015   TRIG 126 12/16/2015   HDL 41 12/16/2015   LDLCALC 48 12/16/2015   ALT 25 10/28/2016   AST 46 (H) 10/28/2016   NA 138 10/28/2016   K 3.9 10/28/2016   CL 104 10/28/2016   CREATININE 0.82 10/28/2016   BUN 11 10/28/2016   CO2 28 10/28/2016   TSH 0.345 (L) 10/30/2015   INR 1.12 10/30/2015   HGBA1C 6.0 (H) 10/30/2015    Assessment & Plan:   Problem List Items Addressed This Visit    Ankle fracture, right, sequela - Primary    Doing well following rehab.      Chronic combined systolic and diastolic heart failure (HCC) (Chronic)    Euvolemic.  Continue current medications.      Relevant Medications   atorvastatin (LIPITOR) 80 MG tablet   furosemide (LASIX) 20 MG tablet   Essential hypertension    Has been stable. Unsure of why BP is up today. Continue Coreg, Lisinopril and Lasix.      Relevant Medications   atorvastatin (LIPITOR) 80 MG tablet   furosemide (LASIX) 20 MG tablet   Hyperlipidemia    At goal on lipitor.  Relevant Medications   atorvastatin (LIPITOR) 80 MG tablet   furosemide (LASIX) 20 MG tablet   Occlusive coronary artery disease requiring drug therapy (Chronic)    Stable. Currently asymptomatic.      Relevant Medications   atorvastatin (LIPITOR) 80 MG tablet   furosemide (LASIX) 20 MG tablet      Meds ordered this encounter  Medications  . atorvastatin (LIPITOR) 80 MG tablet    Sig: Take 1 tablet (80 mg total) by mouth daily.    Dispense:  90 tablet    Refill:  3  . furosemide (LASIX) 20 MG tablet    Sig: Take 1 tablet (20 mg total) by mouth every other day.    Dispense:  90 tablet    Refill:  3  . pregabalin (LYRICA) 75 MG capsule    Sig: Take 1 capsule (75 mg total) by mouth 2 (two) times daily.    Dispense:  180 capsule    Refill:  3   Follow-up: 6 months  [place]

## 2016-11-18 ENCOUNTER — Encounter: Payer: Self-pay | Admitting: Anesthesiology

## 2016-11-18 ENCOUNTER — Ambulatory Visit: Payer: Medicare HMO | Attending: Anesthesiology | Admitting: Anesthesiology

## 2016-11-18 ENCOUNTER — Telehealth: Payer: Self-pay | Admitting: *Deleted

## 2016-11-18 VITALS — BP 167/76 | HR 55 | Temp 97.6°F | Resp 16 | Ht 61.0 in | Wt 119.0 lb

## 2016-11-18 DIAGNOSIS — M545 Low back pain: Secondary | ICD-10-CM | POA: Insufficient documentation

## 2016-11-18 DIAGNOSIS — M1990 Unspecified osteoarthritis, unspecified site: Secondary | ICD-10-CM | POA: Diagnosis not present

## 2016-11-18 DIAGNOSIS — M48061 Spinal stenosis, lumbar region without neurogenic claudication: Secondary | ICD-10-CM | POA: Diagnosis not present

## 2016-11-18 DIAGNOSIS — M797 Fibromyalgia: Secondary | ICD-10-CM | POA: Insufficient documentation

## 2016-11-18 DIAGNOSIS — I110 Hypertensive heart disease with heart failure: Secondary | ICD-10-CM | POA: Diagnosis not present

## 2016-11-18 DIAGNOSIS — I4891 Unspecified atrial fibrillation: Secondary | ICD-10-CM | POA: Diagnosis not present

## 2016-11-18 DIAGNOSIS — I10 Essential (primary) hypertension: Secondary | ICD-10-CM | POA: Insufficient documentation

## 2016-11-18 DIAGNOSIS — E785 Hyperlipidemia, unspecified: Secondary | ICD-10-CM | POA: Diagnosis not present

## 2016-11-18 DIAGNOSIS — J449 Chronic obstructive pulmonary disease, unspecified: Secondary | ICD-10-CM | POA: Diagnosis not present

## 2016-11-18 DIAGNOSIS — K219 Gastro-esophageal reflux disease without esophagitis: Secondary | ICD-10-CM | POA: Diagnosis not present

## 2016-11-18 DIAGNOSIS — Z7982 Long term (current) use of aspirin: Secondary | ICD-10-CM | POA: Insufficient documentation

## 2016-11-18 DIAGNOSIS — M419 Scoliosis, unspecified: Secondary | ICD-10-CM | POA: Diagnosis not present

## 2016-11-18 DIAGNOSIS — I5042 Chronic combined systolic (congestive) and diastolic (congestive) heart failure: Secondary | ICD-10-CM | POA: Diagnosis not present

## 2016-11-18 DIAGNOSIS — I2510 Atherosclerotic heart disease of native coronary artery without angina pectoris: Secondary | ICD-10-CM | POA: Diagnosis not present

## 2016-11-18 DIAGNOSIS — I429 Cardiomyopathy, unspecified: Secondary | ICD-10-CM | POA: Insufficient documentation

## 2016-11-18 DIAGNOSIS — I252 Old myocardial infarction: Secondary | ICD-10-CM | POA: Insufficient documentation

## 2016-11-18 DIAGNOSIS — M069 Rheumatoid arthritis, unspecified: Secondary | ICD-10-CM | POA: Diagnosis not present

## 2016-11-18 DIAGNOSIS — M8580 Other specified disorders of bone density and structure, unspecified site: Secondary | ICD-10-CM | POA: Diagnosis not present

## 2016-11-18 DIAGNOSIS — M1120 Other chondrocalcinosis, unspecified site: Secondary | ICD-10-CM | POA: Insufficient documentation

## 2016-11-18 DIAGNOSIS — M48062 Spinal stenosis, lumbar region with neurogenic claudication: Secondary | ICD-10-CM

## 2016-11-18 DIAGNOSIS — S82841D Displaced bimalleolar fracture of right lower leg, subsequent encounter for closed fracture with routine healing: Secondary | ICD-10-CM | POA: Diagnosis not present

## 2016-11-18 MED ORDER — HYDROCODONE-ACETAMINOPHEN 5-325 MG PO TABS: 1.0000 | ORAL_TABLET | Freq: Four times a day (QID) | ORAL | 0 refills | Status: DC | PRN

## 2016-11-18 NOTE — Patient Instructions (Signed)
Epidural Steroid Injection Patient Information  Description: The epidural space surrounds the nerves as they exit the spinal cord.  In some patients, the nerves can be compressed and inflamed by a bulging disc or a tight spinal canal (spinal stenosis).  By injecting steroids into the epidural space, we can bring irritated nerves into direct contact with a potentially helpful medication.  These steroids act directly on the irritated nerves and can reduce swelling and inflammation which often leads to decreased pain.  Epidural steroids may be injected anywhere along the spine and from the neck to the low back depending upon the location of your pain.   After numbing the skin with local anesthetic (like Novocaine), a small needle is passed into the epidural space slowly.  You may experience a sensation of pressure while this is being done.  The entire block usually last less than 10 minutes.  Conditions which may be treated by epidural steroids:   Low back and leg pain  Neck and arm pain  Spinal stenosis  Post-laminectomy syndrome  Herpes zoster (shingles) pain  Pain from compression fractures  Preparation for the injection:  1. Do not eat any solid food or dairy products within 8 hours of your appointment.  2. You may drink clear liquids up to 3 hours before appointment.  Clear liquids include water, black coffee, juice or soda.  No milk or cream please. 3. You may take your regular medication, including pain medications, with a sip of water before your appointment  Diabetics should hold regular insulin (if taken separately) and take 1/2 normal NPH dos the morning of the procedure.  Carry some sugar containing items with you to your appointment. 4. A driver must accompany you and be prepared to drive you home after your procedure.  5. Bring all your current medications with your. 6. An IV may be inserted and sedation may be given at the discretion of the physician.   7. A blood pressure  cuff, EKG and other monitors will often be applied during the procedure.  Some patients may need to have extra oxygen administered for a short period. 8. You will be asked to provide medical information, including your allergies, prior to the procedure.  We must know immediately if you are taking blood thinners (like Coumadin/Warfarin)  Or if you are allergic to IV iodine contrast (dye). We must know if you could possible be pregnant.  Possible side-effects:  Bleeding from needle site  Infection (rare, may require surgery)  Nerve injury (rare)  Numbness & tingling (temporary)  Difficulty urinating (rare, temporary)  Spinal headache ( a headache worse with upright posture)  Light -headedness (temporary)  Pain at injection site (several days)  Decreased blood pressure (temporary)  Weakness in arm/leg (temporary)  Pressure sensation in back/neck (temporary)  Call if you experience:  Fever/chills associated with headache or increased back/neck pain.  Headache worsened by an upright position.  New onset weakness or numbness of an extremity below the injection site  Hives or difficulty breathing (go to the emergency room)  Inflammation or drainage at the infection site  Severe back/neck pain  Any new symptoms which are concerning to you  Please note:  Although the local anesthetic injected can often make your back or neck feel good for several hours after the injection, the pain will likely return.  It takes 3-7 days for steroids to work in the epidural space.  You may not notice any pain relief for at least that one week.    If effective, we will often do a series of three injections spaced 3-6 weeks apart to maximally decrease your pain.  After the initial series, we generally will wait several months before considering a repeat injection of the same type.  If you have any questions, please call [phone] [place] were given  one prescription for Hydrocodone today.

## 2016-11-18 NOTE — Telephone Encounter (Signed)
Patient has requested to have a 90 day supply of lyrica sent to her [facility], due to cost

## 2016-11-18 NOTE — Telephone Encounter (Signed)
Pt called and told that she was given prescription at her last visit. She was todl to mail herself or bring to our office to fax.

## 2016-11-18 NOTE — Progress Notes (Signed)
Safety precautions to be maintained throughout the outpatient stay will include: orient to surroundings, keep bed in low position, maintain call bell within reach at all times, provide assistance with transfer out of bed and ambulation.  

## 2016-11-18 NOTE — Progress Notes (Signed)
Subjective:  Patient ID: Katherine [name], female    DOB: 08/28/1929  Age: 83 y.o. MRN: [id number]  CC: Back Pain (lower)  Procedure today: None Previous Caudal epidural steroid No. 2 under fluoroscopic guidance and moderate sedation Previous PROCEDURE last visit :  Caudal epidural steroid No. 1 of series with fluoroscopic guidance and moderate sedation  HPI Katherine [name] presents for reevaluation. She was last seen back in August of last year and has had 2 caudal epidural injections. Her last injection gave her significant improvement at 75% lasting approximately 6 months. She's had some recurrence of the low back pain with radiation into the posterior buttocks and down the back of the legs as before and presents today for reevaluation. Otherwise she is in her usual state of health with no new changes in bowel or bladder function. She has been using sparing Vicodin approximately 30 tablets over the course of 4 or 5 months. She is due for refill on that. She is also asking whether she might be able to undergo a repeat caudal epidural as conservative therapy has failed to gain her significant improvement and these have worked well.  She has a long-standing history of diffuse body pain including low back pain. The pain she experiences rates at a VAS of 10 and best 7 but has gradually gotten worse to the point where she has soft hypotension. She has pain in the afternoon and night and with immobility. Aggravating factors include bending climbing kneeling motion sitting standing squatting and twisting. Alleviating factors include rest sleep lying down and medication management. In the past she has had epidural steroid injections which seem to help alleviate some of her low back pain.  Currently the pain complaints she experiences is similar to what she had before with associated tingling worse on the left posterior and lateral leg with associated weakness and pain that keeps her up at night. It is described as  aching and cramping deep disabling. She has had an MRI which reveals multilevel facet degeneration in the lumbosacral spine and evidence of multilevel degenerative disc disease.  History Katherine [name] has a past medical history of Arthritis; CHF (congestive heart failure) ([place]); Chicken pox; Chronic combined systolic and diastolic heart failure (HCC); COPD (chronic obstructive pulmonary disease) ([place]); GERD (gastroesophageal reflux disease); Hyperlipidemia; Hypertension; Hypertensive heart disease; Ischemic cardiomyopathy; and Occlusive coronary artery disease requiring drug therapy.   She has a past surgical history that includes Shoulder surgery; Cardiac catheterization (N/A, 10/30/2015); Cardiac catheterization (N/A, 10/30/2015); Total hip arthroplasty (Right, 2012); Back surgery (2010); Amputation finger (Left, 1966); Appendectomy (1961); Tonsillectomy (1953); Abdominal hysterectomy (1985); and ORIF ankle fracture (Right, 08/24/2016).   Her family history includes Arthritis in her sister and sister; Breast cancer in her sister; CAD in her brother and sister; Heart disease in her brother, brother, brother, brother, brother, brother, mother, sister, sister, sister, and sister; Hyperlipidemia in her mother; Hypertension in her brother, brother, brother, brother, brother, brother, mother, sister, sister, sister, and sister; Lung cancer in her mother; Stroke in her mother, sister, sister, sister, and sister.She reports that she has never smoked. She has never used smokeless tobacco. She reports that she does not drink alcohol or use drugs.  No results found for this or any previous visit.  No results found for: TOXASSSELUR  Outpatient Medications Prior to Visit  Medication Sig Dispense Refill  . aspirin EC 81 MG tablet Take 325 mg by mouth daily.     Marland Kitchen atorvastatin (LIPITOR) 80 MG tablet Take  1 tablet (80 mg total) by mouth daily. 90 tablet 3  . bisacodyl (DULCOLAX) 10 MG suppository Place 1 suppository (10 mg  total) rectally daily as needed for moderate constipation. 12 suppository 0  . carvedilol (COREG) 6.25 MG tablet Take 1 tablet (6.25 mg total) by mouth 2 (two) times daily with a meal. 180 tablet 3  . ferrous sulfate 325 (65 FE) MG tablet Take 1 tablet (325 mg total) by mouth 3 (three) times daily after meals. 90 tablet 3  . furosemide (LASIX) 20 MG tablet Take 1 tablet (20 mg total) by mouth every other day. 90 tablet 3  . Lactobacillus-Inulin ([place]ven) CAPS Take 1 capsule by mouth every morning. 30 capsule 0  . lisinopril (PRINIVIL,ZESTRIL) 20 MG tablet Take 20 mg by mouth daily.    . multivitamin-iron-minerals-folic acid (CENTRUM) chewable tablet Chew 1 tablet by mouth daily.    . nitroGLYCERIN (NITROSTAT) 0.4 MG SL tablet Place 1 tablet (0.4 mg total) under the tongue every 5 (five) minutes as needed for chest pain. 25 tablet 3  . omega-3 acid ethyl esters (LOVAZA) 1 g capsule Take 1 g by mouth daily.    . pantoprazole (PROTONIX) 40 MG tablet Take 1 tablet (40 mg total) by mouth daily. 90 tablet 2  . polyethylene glycol (MIRALAX / GLYCOLAX) packet Take 17 g by mouth daily as needed for mild constipation. 14 each 0  . pregabalin (LYRICA) 75 MG capsule Take 1 capsule (75 mg total) by mouth 2 (two) times daily. 180 capsule 3  . sertraline (ZOLOFT) 100 MG tablet Take 1 tablet (100 mg total) by mouth daily. 90 tablet 3  . thiamine (VITAMIN B-1) 100 MG tablet Take 1 tablet (100 mg total) by mouth daily. 90 tablet 1  . HYDROcodone-acetaminophen (NORCO/VICODIN) 5-325 MG tablet Take 1 tablet by mouth every 4 (four) hours as needed for moderate pain or severe pain. 30 tablet 0   No facility-administered medications prior to visit.    Lab Results  Component Value Date   WBC 12.0 (H) 10/28/2016   HGB 12.2 10/28/2016   HCT 36.8 10/28/2016   PLT 163 10/28/2016   GLUCOSE 117 (H) 10/28/2016   CHOL 114 12/16/2015   TRIG 126 12/16/2015   HDL 41 12/16/2015   LDLCALC 48 12/16/2015    ALT 25 10/28/2016   AST 46 (H) 10/28/2016   NA 138 10/28/2016   K 3.9 10/28/2016   CL 104 10/28/2016   CREATININE 0.82 10/28/2016   BUN 11 10/28/2016   CO2 28 10/28/2016   TSH 0.345 (L) 10/30/2015   INR 1.12 10/30/2015   HGBA1C 6.0 (H) 10/30/2015    --------------------------------------------------------------------------------------------------------------------- Dg Knee 1-2 Views Left  01/07/2016  CLINICAL DATA:  Left anterior knee pain beginning today.  No injury. EXAM: LEFT KNEE - 1-2 VIEW COMPARISON:  None. FINDINGS: No evidence of fracture, dislocation, or joint effusion. Chondrocalcinosis. No notable degenerative changes for age. Osteopenia. IMPRESSION: No acute finding. Electronically Signed   By: Monte Fantasia M.D.   On: 01/07/2016 20:00   Mr Lumbar Spine Wo Contrast  01/08/2016  CLINICAL DATA:  Low back pain with left thigh pain radiating down to the left foot EXAM: MRI LUMBAR SPINE WITHOUT CONTRAST TECHNIQUE: Multiplanar, multisequence MR imaging of the lumbar spine was performed. No intravenous contrast was administered. COMPARISON:  None. FINDINGS: Segmentation:  Standard. Alignment: 8 mm of anterolisthesis of L4 on L5 secondary to bilateral L4 pars interarticularis defects. The alignment is otherwise anatomic. Vertebrae:  No  fracture, evidence of discitis, or bone lesion. Conus medullaris: Extends to the T12-L1 level and appears normal. Paraspinal and other soft tissues: Negative. Disc levels: Disc spaces: Degenerative disc disease with disc height loss at L3-4, L4-5 and L5-S1. T11-12: Mild broad-based disc bulge. Mild bilateral facet arthropathy. No foraminal or central canal stenosis. T12-L1: No significant disc bulge. No evidence of neural foraminal stenosis. No central canal stenosis. L1-L2: Mild broad-based disc bulge. Mild bilateral facet arthropathy. No evidence of neural foraminal stenosis. No central canal stenosis. L2-L3: Mild broad-based disc bulge. Moderate right facet  arthropathy. Mild right foraminal stenosis. Right lateral recess stenosis. No central canal stenosis. L3-L4: Mild broad-based disc bulge. Mild bilateral facet arthropathy. Mild right foraminal stenosis. Right lateral recess stenosis. No left foraminal stenosis. No central canal stenosis. L4-L5: No disc protrusion. Grade 1 anterolisthesis of L4 on L5. Severe bilateral facet arthropathy and bilateral L4 pars interarticularis defects. Severe bilateral foraminal stenosis. Mild spinal stenosis. L5-S1: Mild broad-based disc bulge. Mild bilateral facet arthropathy. Mild left foraminal stenosis. No central canal stenosis. IMPRESSION: 1. At L4-5 there is grade 1 anterolisthesis of L4 on L5. Severe bilateral facet arthropathy and bilateral L4 pars interarticularis defects. Severe bilateral foraminal stenosis. Mild spinal stenosis. 2. At L2-3 there is a mild broad-based disc bulge. Moderate right facet arthropathy. Mild right foraminal stenosis. Right lateral recess stenosis. 3. At L3-4 there is a mild Mild broad-based disc bulge. Mild bilateral facet arthropathy. Mild right foraminal stenosis. Right lateral recess stenosis. Electronically Signed   By: Kathreen Devoid   On: 01/08/2016 09:27   Ct Cta Abd/pel W/cm &/or W/o Cm  01/07/2016  ADDENDUM REPORT: 01/07/2016 19:54 ADDENDUM: The there are a few small bilateral sub cm renal cortical hypodensities too small to characterize but likely cysts. Electronically Signed   By: Marin Olp M.D.   On: 01/07/2016 19:54  01/07/2016  CLINICAL DATA:  Sciatica.  Possible dehydration. EXAM: CTA ABDOMEN AND PELVIS wITHOUT AND WITH CONTRAST TECHNIQUE: Multidetector CT imaging of the abdomen and pelvis was performed using the standard protocol during bolus administration of intravenous contrast. Multiplanar reconstructed images and MIPs were obtained and reviewed to evaluate the vascular anatomy. CONTRAST:  100 mL Isovue 370 IV COMPARISON:  None. FINDINGS: Lung bases are within normal.  Abdominal images demonstrate multiple splenic granulomas. The liver, pancreas, gallbladder and adrenal glands are normal. Kidneys are normal in size without hydronephrosis or nephrolithiasis. There is a 2.3 cm cyst over the mid pole the right kidney. Ureters are within normal. There is mild calcified plaque over the abdominal aorta iliac arteries. Mild focal stenosis at the takeoff of the celiac axis for comparison Appendix is not well visualized. Minimal diverticulosis of the colon which is otherwise within normal. Small bowel is within normal. Mesentery is unremarkable without inflammatory change or free fluid. Pelvic images demonstrate mild bladder distension. Surgical absence of the uterus. No free pelvic fluid. Right hip arthroplasty intact. There are degenerative changes of the spine with multilevel disc disease over the lumbosacral spine mean grade 1-2 anterolisthesis of L4 on L5. Previous L3-L5 laminectomy. Several metallic densities over the inguinal regions. Review of the MIP images confirms the above findings. IMPRESSION: No acute findings in the abdomen/ pelvis. 2.3 cm right renal cyst. Minimal diverticulosis of the colon. Significant degenerative changes with multilevel disc disease over the lumbosacral spine. Grade 1-2 anterolisthesis L4 on L5. Electronically Signed: By: Marin Olp M.D. On: 01/07/2016 18:26       ---------------------------------------------------------------------------------------------------------------------- Past Medical History:  Diagnosis  Date  . Arthritis   . CHF (congestive heart failure) ([place])   . Chicken pox   . Chronic combined systolic and diastolic heart failure ([place])    a. 10/2015 Echo: EF 35-40%, Akinesis of mid-apicalanteroseptal wall & AK of apex, Gr 1DD.  Marland Kitchen COPD (chronic obstructive pulmonary disease) ([place])    worked in a [employer]  . GERD (gastroesophageal reflux disease)   . Hyperlipidemia   . Hypertension   . Hypertensive heart disease   .  Ischemic cardiomyopathy    a. 10/2015 Echo: EF 35-40%, Akinesis of mid-apicalanteroseptal wall & AK of apex, Gr 1DD.  Marland Kitchen Occlusive coronary artery disease requiring drug therapy    a. Remote MI - never sought treatment when it occurred, shows up old on EKG;  b. Cath 10/2015 - 100% pLAD CTO with 80% D1, prox Cx 60% (FFR 0.92-->med Rx) - EF ~35%    Past Surgical History:  Procedure Laterality Date  . ABDOMINAL HYSTERECTOMY  1985  . AMPUTATION FINGER Left 1966   ring finger  . APPENDECTOMY  1961  . BACK SURGERY  2010   lower back  . CARDIAC CATHETERIZATION N/A 10/30/2015   Procedure: Right/Left Heart Cath and Coronary Angiography;  Surgeon: Wellington Hampshire, MD;  Location: [facility];  Service: Cardiovascular;  Laterality: N/A;  . CARDIAC CATHETERIZATION N/A 10/30/2015   Procedure: Intravascular Pressure Wire/FFR Study;  Surgeon: Wellington Hampshire, MD;  Location: [facility];  Service: Cardiovascular;  Laterality: N/A;  . ORIF ANKLE FRACTURE Right 08/24/2016   Procedure: OPEN REDUCTION INTERNAL FIXATION (ORIF) ANKLE FRACTURE;  Surgeon: Thornton Park, MD;  Location: [facility];  Service: Orthopedics;  Laterality: Right;  . SHOULDER SURGERY    . TONSILLECTOMY  1953  . TOTAL HIP ARTHROPLASTY Right 2012    Family History  Problem Relation Age of Onset  . Heart disease Mother   . Stroke Mother   . Hypertension Mother   . Lung cancer Mother   . Hyperlipidemia Mother   . CAD Sister   . Heart disease Sister   . Stroke Sister   . Hypertension Sister   . Arthritis Sister   . CAD Brother   . Heart disease Brother   . Hypertension Brother   . Heart disease Sister   . Stroke Sister   . Hypertension Sister   . Arthritis Sister   . Heart disease Sister   . Stroke Sister   . Hypertension Sister   . Breast cancer Sister   . Heart disease Sister   . Stroke Sister   . Hypertension Sister   . Heart disease Brother   . Hypertension Brother   . Heart disease Brother   .  Hypertension Brother   . Heart disease Brother   . Hypertension Brother   . Heart disease Brother   . Hypertension Brother   . Heart disease Brother   . Hypertension Brother     Social History  Substance Use Topics  . Smoking status: Never Smoker  . Smokeless tobacco: Never Used  . Alcohol use No    ---------------------------------------------------------------------------------------------------------------------- Social History   Social History  . Marital status: Single    Spouse name: N/A  . Number of children: N/A  . Years of education: N/A   Social History Main Topics  . Smoking status: Never Smoker  . Smokeless tobacco: Never Used  . Alcohol use No  . Drug use: No  . Sexual activity: No   Other Topics Concern  .  None   Social History Narrative   Patient lives in [place] but visits her family in [place] frequently. She is very active and walks often.    Scheduled Meds: Continuous Infusions: PRN Meds:.   BP (!) 167/76   Pulse (!) 55   Temp 97.6 F (36.4 C)   Resp 16   Ht 5\' 1"  (1.549 m)   Wt 119 lb (54 kg)   SpO2 99%   BMI 22.48 kg/m    BP Readings from Last 3 Encounters:  11/18/16 (!) 167/76  11/16/16 (!) 160/70  11/09/16 138/64     Wt Readings from Last 3 Encounters:  11/18/16 119 lb (54 kg)  11/16/16 119 lb 4 oz (54.1 kg)  11/09/16 140 lb (63.5 kg)     ----------------------------------------------------------------------------------------------------------------------  ROS Review of Systems  Cardiac: History of A. fib on a daily aspirin taking Plavix this was DC'd approximately 10 days ago. Also high blood pressure and history of heart attack in March 2017. History of congestive heart failure previous heart catheterization. Pulmonary: Lung problems with history of shortness of breath Neurologic: Scoliosis and chronic low back pain Psychologic: Negative GI reflux Hematologic easy bruising and bleeding Rheumatologic history of  osteoarthritis fibromyalgia and chronic fatigue syndrome and rheumatoid arthritis  Objective:  BP (!) 167/76   Pulse (!) 55   Temp 97.6 F (36.4 C)   Resp 16   Ht 5\' 1"  (1.549 m)   Wt 119 lb (54 kg)   SpO2 99%   BMI 22.48 kg/m   Physical Exam Pupils are equally round reactive to light with extraocular muscles intact Patient is a good historian appropriate cooperative compliant Heart is irregular with appropriate right    Assessment & Plan:   Katherine [name] was seen today for back pain.  Diagnoses and all orders for this visit:  Spinal stenosis of lumbar region with neurogenic claudication -     Lumbar Epidural Injection; Future  Other orders -     HYDROcodone-acetaminophen (NORCO/VICODIN) 5-325 MG tablet; Take 1 tablet by mouth every 6 (six) hours as needed for moderate pain or severe pain.     ----------------------------------------------------------------------------------------------------------------------  Problem List Items Addressed This Visit    None    Visit Diagnoses    Spinal stenosis of lumbar region with neurogenic claudication    -  Primary   Relevant Orders   Lumbar Epidural Injection      ----------------------------------------------------------------------------------------------------------------------  1. Spinal stenosis of lumbar region  We will schedule her for a return next week and to undergo a caudal epidural steroid for her bilateral L5 radiculitis. She has been on Plavix but this was discontinued many months ago but still takes a daily aspirin. She furthermore has eaten today and has an appointment in about an hour and a half. We will schedule her for early next week for a repeat caudal epidural steroid injection.   2. Failed back syndrome of lumbar spine I will give her a refill on her Vicodin and today. We have talked about the importance of sparing use of this medication and the risks and benefits of it as  well.     ----------------------------------------------------------------------------------------------------------------------  I have changed Ms. Gilkey HYDROcodone-acetaminophen. I am also having her maintain her aspirin EC, nitroGLYCERIN, omega-3 acid ethyl esters, carvedilol, sertraline, multivitamin-iron-minerals-folic acid, pantoprazole, bisacodyl, ferrous sulfate, polyethylene glycol, thiamine, [employer], lisinopril, atorvastatin, furosemide, and pregabalin.   Meds ordered this encounter  Medications  . HYDROcodone-acetaminophen (NORCO/VICODIN) 5-325 MG tablet    Sig: Take  1 tablet by mouth every 6 (six) hours as needed for moderate pain or severe pain.    Dispense:  30 tablet    Refill:  0   Follow-up: Return in about 1 week (around 11/25/2016) for procedure.    Molli Barrows, MD  This dictation was performed utilizing Dragon voice recognition software.  Please excuse any unintentional or mistaken typographical errors as a result of its unedited utilization.

## 2016-11-19 ENCOUNTER — Encounter: Payer: Medicare HMO | Attending: Surgery | Admitting: Surgery

## 2016-11-19 DIAGNOSIS — K219 Gastro-esophageal reflux disease without esophagitis: Secondary | ICD-10-CM | POA: Diagnosis not present

## 2016-11-19 DIAGNOSIS — S82851D Displaced trimalleolar fracture of right lower leg, subsequent encounter for closed fracture with routine healing: Secondary | ICD-10-CM | POA: Diagnosis not present

## 2016-11-19 DIAGNOSIS — J449 Chronic obstructive pulmonary disease, unspecified: Secondary | ICD-10-CM | POA: Diagnosis not present

## 2016-11-19 DIAGNOSIS — Y839 Surgical procedure, unspecified as the cause of abnormal reaction of the patient, or of later complication, without mention of misadventure at the time of the procedure: Secondary | ICD-10-CM | POA: Diagnosis not present

## 2016-11-19 DIAGNOSIS — T8189XA Other complications of procedures, not elsewhere classified, initial encounter: Secondary | ICD-10-CM | POA: Diagnosis not present

## 2016-11-19 DIAGNOSIS — L97311 Non-pressure chronic ulcer of right ankle limited to breakdown of skin: Secondary | ICD-10-CM | POA: Insufficient documentation

## 2016-11-19 DIAGNOSIS — M84471G Pathological fracture, right ankle, subsequent encounter for fracture with delayed healing: Secondary | ICD-10-CM | POA: Diagnosis not present

## 2016-11-19 DIAGNOSIS — I110 Hypertensive heart disease with heart failure: Secondary | ICD-10-CM | POA: Insufficient documentation

## 2016-11-19 DIAGNOSIS — T8131XA Disruption of external operation (surgical) wound, not elsewhere classified, initial encounter: Secondary | ICD-10-CM | POA: Diagnosis not present

## 2016-11-19 DIAGNOSIS — I5042 Chronic combined systolic (congestive) and diastolic (congestive) heart failure: Secondary | ICD-10-CM | POA: Diagnosis not present

## 2016-11-19 DIAGNOSIS — Z7982 Long term (current) use of aspirin: Secondary | ICD-10-CM | POA: Insufficient documentation

## 2016-11-19 DIAGNOSIS — I509 Heart failure, unspecified: Secondary | ICD-10-CM | POA: Diagnosis not present

## 2016-11-19 DIAGNOSIS — I890 Lymphedema, not elsewhere classified: Secondary | ICD-10-CM | POA: Diagnosis not present

## 2016-11-19 DIAGNOSIS — I255 Ischemic cardiomyopathy: Secondary | ICD-10-CM | POA: Diagnosis not present

## 2016-11-19 DIAGNOSIS — E785 Hyperlipidemia, unspecified: Secondary | ICD-10-CM | POA: Diagnosis not present

## 2016-11-19 DIAGNOSIS — I252 Old myocardial infarction: Secondary | ICD-10-CM | POA: Diagnosis not present

## 2016-11-19 DIAGNOSIS — M1990 Unspecified osteoarthritis, unspecified site: Secondary | ICD-10-CM | POA: Diagnosis not present

## 2016-11-19 DIAGNOSIS — G629 Polyneuropathy, unspecified: Secondary | ICD-10-CM | POA: Diagnosis not present

## 2016-11-19 DIAGNOSIS — I2510 Atherosclerotic heart disease of native coronary artery without angina pectoris: Secondary | ICD-10-CM | POA: Diagnosis not present

## 2016-11-19 DIAGNOSIS — R69 Illness, unspecified: Secondary | ICD-10-CM | POA: Diagnosis not present

## 2016-11-20 NOTE — Progress Notes (Signed)
JENY, NIELD ([id number]) Visit Report for 11/19/2016 Chief Complaint Document Details Patient Name: Katherine [name], Katherine I. Date of Service: 11/19/2016 3:30 PM Medical Record Number: [id number] Patient Account Number: [id number] Date of Birth/Sex: 08/13/1930 (83 y.o. Female) Treating RN: Baruch Gouty, RN, BSN, Velva Harman Primary Care Provider: Thersa Salt Other Clinician: Referring Provider: Thersa Salt Treating Provider/Extender: Frann Rider in Treatment: 6 Information Obtained from: Patient Chief Complaint Patient presents to the wound care center with open non-healing surgical wound(s) of the right ankle after a trimalleolar fracture was repaired ion 9th January 2018 Electronic Signature(s) Signed: 11/19/2016 4:11:50 PM By: Christin Fudge MD, FACS Entered By: Christin Fudge on 11/19/2016 16:11:50 Katherine Juba I. ([id number]) -------------------------------------------------------------------------------- Debridement Details Patient Name: Katherine Juba I. Date of Service: 11/19/2016 3:30 PM Medical Record Number: [id number] Patient Account Number: [id number] Date of Birth/Sex: May 17, 1930 (83 y.o. Female) Treating RN: Afful, RN, BSN, [place] Primary Care Provider: Thersa Salt Other Clinician: Referring Provider: Thersa Salt Treating Provider/Extender: Frann Rider in Treatment: 6 Debridement Performed for Wound #1 Right,Lateral Lower Leg Assessment: Performed By: Physician Christin Fudge, MD Debridement: Debridement Pre-procedure Yes - 15:50 Verification/Time Out Taken: Start Time: 15:49 Pain Control: Lidocaine 4% Topical Solution Level: Skin/Subcutaneous Tissue Total Area Debrided (L x 0.5 (cm) x 0.5 (cm) = 0.25 (cm) W): Tissue and other Non-Viable, Fibrin/Slough, Subcutaneous material debrided: Instrument: Curette Bleeding: Minimum Hemostasis Achieved: Pressure End Time: 15:51 Procedural Pain: 0 Post Procedural Pain: 0 Response to Treatment: Procedure was tolerated well Post Debridement  Measurements of Total Wound Length: (cm) 0.5 Width: (cm) 0.5 Depth: (cm) 0.2 Volume: (cm) 0.039 Character of Wound/Ulcer Post Stable Debridement: Severity of Tissue Post Debridement: Fat layer exposed Post Procedure Diagnosis Same as Pre-procedure Electronic Signature(s) Signed: 11/19/2016 4:11:44 PM By: Christin Fudge MD, FACS Signed: 11/19/2016 4:21:39 PM By: Regan Lemming BSN, RN Entered By: Christin Fudge on 11/19/2016 16:11:44 Katherine Juba I. ([id number]) Katherine [name], Katherine [name] Kitchen ([id number]) -------------------------------------------------------------------------------- HPI Details Patient Name: Katherine Juba I. Date of Service: 11/19/2016 3:30 PM Medical Record Number: [id number] Patient Account Number: [id number] Date of Birth/Sex: Dec 14, 1929 (83 y.o. Female) Treating RN: Baruch Gouty, RN, BSN, Velva Harman Primary Care Provider: Thersa Salt Other Clinician: Referring Provider: Thersa Salt Treating Provider/Extender: Frann Rider in Treatment: 6 History of Present Illness Location: right lateral ankle Quality: Patient reports experiencing a dull pain to affected area(s). Severity: Patient states wound (s) are getting better. Duration: Patient has had the wound for < 6 weeks prior to presenting for treatment Timing: Pain in wound is constant (hurts all the time) Context: The wound appeared gradually over time Modifying Factors: Other treatment(s) tried include:nonweightbearing boot Associated Signs and Symptoms: Patient reports having increase swelling. HPI Description: 83 year old patient who has recently been seen several times at the orthopedic office for problems with her wound healing after she had a right ankle trimalleolar fracture repaired on 08/24/2016. He simply did notice some drainage from the incision and she was reviewed in the orthopedic office on 10/01/2016 and started on Keflex and Septra. She was sent to [place] for an opinion is also than be following up with the orthopedic physician.  Most recent x-rays taken of the orthopedic office were within normal limits. The patient does not have any significant history of diabetes mellitus or smoking but has had edema problems in the legs and does say that she's seen a vein surgeon in the past. All this is mainly done in [place], [place] where she normally resides. 11/19/2016 -- she saw her orthopedic surgeon who has asked her to  weight-bear without the orthopedic shoe and she has been walking around without much problems. Electronic Signature(s) Signed: 11/19/2016 4:12:25 PM By: Christin Fudge MD, FACS Entered By: Christin Fudge on 11/19/2016 16:12:25 Katherine [name] ([id number]) -------------------------------------------------------------------------------- Physical Exam Details Patient Name: Katherine Juba I. Date of Service: 11/19/2016 3:30 PM Medical Record Number: [id number] Patient Account Number: [id number] Date of Birth/Sex: [birth year] (83 y.o. Female) Treating RN: Baruch Gouty, RN, BSN, Velva Harman Primary Care Provider: Thersa Salt Other Clinician: Referring Provider: Thersa Salt Treating Provider/Extender: Frann Rider in Treatment: 6 Constitutional . Pulse regular. Respirations normal and unlabored. Afebrile. . Eyes Nonicteric. Reactive to light. Ears, Nose, Mouth, and Throat Lips, teeth, and gums WNL.Marland Kitchen Moist mucosa without lesions. Neck supple and nontender. No palpable supraclavicular or cervical adenopathy. Normal sized without goiter. Respiratory WNL. No retractions.. Breath sounds WNL, No rubs, rales, rhonchi, or wheeze.. Cardiovascular Heart rhythm and rate regular, no murmur or gallop.. Pedal Pulses WNL. No clubbing, cyanosis or edema. Chest Breasts symmetical and no nipple discharge.. Breast tissue WNL, no masses, lumps, or tenderness.. Lymphatic No adneopathy. No adenopathy. No adenopathy. Musculoskeletal Adexa without tenderness or enlargement.. Digits and nails w/o clubbing, cyanosis, infection,  petechiae, ischemia, or inflammatory conditions.. Integumentary (Hair, Skin) No suspicious lesions. No crepitus or fluctuance. No peri-wound warmth or erythema. No masses.Marland Kitchen Psychiatric Judgement and insight Intact.. No evidence of depression, anxiety, or agitation.. Notes the wound is looking good with minimal eschar and subcutaneous debris and are sharply remove this with a #3 curet and bleeding controlled with pressure and lymphedema is much better Electronic Signature(s) Signed: 11/19/2016 4:12:49 PM By: Christin Fudge MD, FACS Entered By: Christin Fudge on 11/19/2016 16:12:48 Katherine [name] ([id number]) -------------------------------------------------------------------------------- Physician Orders Details Patient Name: Katherine Juba I. Date of Service: 11/19/2016 3:30 PM Medical Record Number: [id number] Patient Account Number: [id number] Date of Birth/Sex: [birth year] (82 y.o. Female) Treating RN: Afful, RN, BSN, [place] Primary Care Provider: Thersa Salt Other Clinician: Referring Provider: Thersa Salt Treating Provider/Extender: Frann Rider in Treatment: 6 Verbal / Phone Orders: No Diagnosis Coding Wound Cleansing Wound #1 Right,Lateral Lower Leg o Cleanse wound with mild soap and water - in clinic Anesthetic Wound #1 Right,Lateral Lower Leg o Topical Lidocaine 4% cream applied to wound bed prior to debridement - for clinic use Skin Barriers/Peri-Wound Care Wound #1 Right,Lateral Lower Leg o Moisturizing lotion Primary Wound Dressing Wound #1 Right,Lateral Lower Leg o Aquacel Ag Secondary Dressing Wound #1 Right,Lateral Lower Leg o Gauze, ABD and Kerlix/Conform Dressing Change Frequency Wound #1 Right,Lateral Lower Leg o Change dressing every week - pt to get changed at wound care center next week Follow-up Appointments Wound #1 Right,Lateral Lower Leg o Return Appointment in 1 week. Edema Control Wound #1 Right,Lateral Lower Leg o 3 Layer  Compression System - Right Lower Extremity - unna to anchor o Elevate legs to the level of the heart and pump ankles as often as possible Additional Orders / Instructions LIDIE, GLADE I. ([id number]) Wound #1 Right,Lateral Lower Leg o Increase protein intake. Home Health Wound #1 Katherine [name] - [place] Nurse may visit PRN to address patientos wound care needs. o FACE TO FACE ENCOUNTER: MEDICARE and MEDICAID PATIENTS: I certify that this patient is under my care and that I had a face-to-face encounter that meets the physician face-to-face encounter requirements with this patient on this date. The encounter with the patient was in whole or in part for the following MEDICAL CONDITION: (primary reason for Home  Healthcare) MEDICAL NECESSITY: I certify, that based on my findings, NURSING services are a medically necessary home health service. HOME BOUND STATUS: I certify that my clinical findings support that this patient is homebound (Maddene., Due to illness or injury, pt requires aid of supportive devices such as crutches, cane, wheelchairs, walkers, the use of special transportation or the assistance of another person to leave their place of residence. There is a normal inability to leave the home and doing so requires considerable and taxing effort. Other absences are for medical reasons / religious services and are infrequent or of short duration when for other reasons). o If current dressing causes regression in wound condition, may D/C ordered dressing product/s and apply Normal Saline Moist Dressing daily until next [place] / Other MD appointment. [place] of regression in wound condition at [phone]. o Please direct any NON-WOUND related issues/requests for orders to patient's Primary Care Physician Electronic Signature(s) Signed: 11/19/2016 4:19:22 PM By: Christin Fudge MD, FACS Signed:  11/19/2016 4:21:39 PM By: Regan Lemming BSN, RN Entered By: Regan Lemming on 11/19/2016 15:51:46 Katherine Juba [name] Kitchen ([id number]) -------------------------------------------------------------------------------- Problem List Details Patient Name: Katherine Juba I. Date of Service: 11/19/2016 3:30 PM Medical Record Number: [id number] Patient Account Number: [id number] Date of Birth/Sex: [birth year] (83 y.o. 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 Date of Service: 11/19/2016 3:30 PM Medical Record Number: [id number] Patient Account Number: [id number] Date of Birth/Sex: 06/26/1930 (83 y.o. Female) Treating RN: Afful, RN, BSN, Velva Harman Primary Care Provider: Thersa Salt Other Clinician: Referring Provider: Thersa Salt Treating Provider/Extender: Frann Rider in Treatment: 6 Subjective Chief Complaint Information obtained from Patient Patient presents to the wound care center with open non-healing surgical wound(s) of the right ankle after a trimalleolar fracture was repaired ion 9th  January 2018 History of Present Illness (HPI) The following HPI elements were documented for the patient's wound: Location: right lateral ankle Quality: Patient reports experiencing a dull pain to affected area(s). Severity: Patient states wound (s) are getting better. Duration: Patient has had the wound for < 6 weeks prior to presenting for treatment Timing: Pain in wound is constant (hurts all the time) Context: The wound appeared gradually over time Modifying Factors: Other treatment(s) tried include:nonweightbearing boot Associated Signs and Symptoms: Patient reports having increase swelling. 83 year old patient who has recently been seen several times at the orthopedic office for problems with her wound healing after she had a right ankle trimalleolar fracture repaired on 08/24/2016. He simply did notice some drainage from the incision and she was reviewed in the orthopedic office on 10/01/2016 and started on Keflex and Septra. She was sent to [place] for an opinion is also than be following up with the orthopedic physician. Most recent x-rays taken of the orthopedic office were within normal limits. The patient does not have any significant history of  diabetes mellitus or smoking but has had edema problems in the legs and does say that she's seen a vein surgeon in the past. All this is mainly done in [place], [place] where she normally resides. 11/19/2016 -- she saw her orthopedic surgeon who has asked her to weight-bear without the orthopedic shoe and she has been walking around without much problems. Objective Constitutional Pulse regular. Respirations normal and unlabored. Afebrile. [place], [place] I. ([id number]) Vitals Time Taken: 3:30 PM, Height: 61 in, Weight: 140 lbs, BMI: 26.4, Temperature: 98.1 F, Pulse: 55 bpm, Respiratory Rate: 16 breaths/min, Blood Pressure: 110/83 mmHg. Eyes Nonicteric. Reactive to light. Ears, Nose, Mouth, and Throat Lips, teeth, and gums WNL.Marland Kitchen Moist mucosa  without lesions. Neck supple and nontender. No palpable supraclavicular or cervical adenopathy. Normal sized without goiter. Respiratory WNL. No retractions.. Breath sounds WNL, No rubs, rales, rhonchi, or wheeze.. Cardiovascular Heart rhythm and rate regular, no murmur or gallop.. Pedal Pulses WNL. No clubbing, cyanosis or edema. Chest Breasts symmetical and no nipple discharge.. Breast tissue WNL, no masses, lumps, or tenderness.. Lymphatic No adneopathy. No adenopathy. No adenopathy. Musculoskeletal Adexa without tenderness or enlargement.. Digits and nails w/o clubbing, cyanosis, infection, petechiae, ischemia, or inflammatory conditions.Marland Kitchen Psychiatric Judgement and insight Intact.. No evidence of depression, anxiety, or agitation.. General Notes: the wound is looking good with minimal eschar and subcutaneous debris and are sharply remove this with a #3 curet and bleeding controlled with pressure and lymphedema is much better Integumentary (Hair, Skin) No suspicious lesions. No crepitus or fluctuance. No peri-wound warmth or erythema. No masses.. Wound #1 status is Open. Original cause of wound was Surgical Injury. The wound is located on the Right,Lateral Lower Leg. The wound measures 0.5cm length x 0.5cm width x 0.2cm depth; 0.196cm^2 area and 0.039cm^3 volume. There is Fat Layer (Subcutaneous Tissue) Exposed exposed. There is no tunneling or undermining noted. There is a small amount of serous drainage noted. The wound margin is distinct with the outline attached to the wound base. There is large (67-100%) pale, friable granulation within the wound bed. There is a small (1-33%) amount of necrotic tissue within the wound bed including Adherent Slough. The periwound skin appearance did not exhibit: Callus, Crepitus, Excoriation, Induration, Rash, Scarring, Dry/Scaly, Maceration, Atrophie Blanche, Cyanosis, Ecchymosis, Hemosiderin Staining, Mottled, Pallor, Rubor, Erythema. Periwound  temperature was noted as No Abnormality. The periwound has tenderness on palpation. Katherine [name], Katherine I. 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 There was a Skin/Subcutaneous Tissue Debridement ([id number]) debridement with total area of 0.25 sq cm performed by Christin Fudge, MD. with the following instrument(s): Curette to remove Non-Viable tissue/material including Fibrin/Slough and Subcutaneous after achieving pain control using Lidocaine 4% Topical Solution. A time out was conducted at 15:50, prior to the start of the procedure. A Minimum amount of bleeding was controlled with Pressure. The procedure was tolerated well with a pain level of 0 throughout and a pain level of 0 following the procedure. Post Debridement Measurements: 0.5cm length x 0.5cm width x 0.2cm depth; 0.039cm^3 volume. Character of Wound/Ulcer Post Debridement is stable. Severity of Tissue Post Debridement is: Fat layer exposed. Post procedure Diagnosis Wound #1: Same as  Pre-Procedure Plan Wound Cleansing: Wound #1 Right,Lateral Lower Leg: Cleanse wound with mild soap and water - in clinic Anesthetic: Wound #1 Right,Lateral Lower Leg: Topical Lidocaine 4% cream applied to wound bed prior to debridement - for clinic use Skin Barriers/Peri-Wound Care: Wound #1 Right,Lateral Lower Leg: Moisturizing lotion Primary Wound Dressing: Katherine [name], Katherine I. ([id number]) Wound #1 Right,Lateral Lower Leg: Aquacel Ag Secondary Dressing: Wound #1 Right,Lateral Lower Leg: Gauze, ABD and  Kerlix/Conform Dressing Change Frequency: Wound #1 Right,Lateral Lower Leg: Change dressing every week - pt to get changed at wound care center next week Follow-up Appointments: Wound #1 Right,Lateral Lower Leg: Return Appointment in 1 week. Edema Control: Wound #1 Right,Lateral Lower Leg: 3 Layer Compression System - Right Lower Extremity - unna to anchor Elevate legs to the level of the heart and pump ankles as often as possible Additional Orders / Instructions: Wound #1 Right,Lateral Lower Leg: Increase protein intake. Home Health: Wound #1 Right,Lateral Lower Leg: Continue Home Health [name] - [place] Nurse may visit PRN to address patient s wound care needs. FACE TO FACE ENCOUNTER: MEDICARE and MEDICAID PATIENTS: I certify that this patient is under my care and that I had a face-to-face encounter that meets the physician face-to-face encounter requirements with this patient on this date. The encounter with the patient was in whole or in part for the following MEDICAL CONDITION: (primary reason for [place]) MEDICAL NECESSITY: I certify, that based on my findings, NURSING services are a medically necessary home health service. HOME BOUND STATUS: I certify that my clinical findings support that this patient is homebound (Maddene., Due to illness or injury, pt requires aid of supportive devices such as crutches, cane, wheelchairs, walkers, the use of special transportation or the assistance of another person to leave their place of residence. There is a normal inability to leave the home and doing so requires considerable and taxing effort. Other absences are for medical reasons / religious services and are infrequent or of short duration when for other reasons). If current dressing causes regression in wound condition, may D/C ordered dressing product/s and apply Normal Saline Moist Dressing daily until next [place] / Other MD appointment. [place] of regression in wound condition at [phone]. Please direct any NON-WOUND related issues/requests for orders to patient's Primary Care Physician after review I have recommended: 1. Silver alginate to be covered with Drawtex and some gauze padding and wrapped with a Profore lite 2. Elevation has been recommended 3. Will order some juxta light stockings which she will be able to use once her wound is healed. 4. Phisical Therapy as per the orthopedic physician's orders 5. review next weeks after the orthopedic opinion Katherine [name], Katherine [name] ([id number]) Electronic Signature(s) Signed: 11/19/2016 4:13:57 PM By: Christin Fudge MD, FACS Entered By: Christin Fudge on 11/19/2016 16:13:57 Katherine Juba [name] Kitchen ([id number]) -------------------------------------------------------------------------------- [place] Details Patient Name: Katherine Juba I. Date of Service: 11/19/2016 Medical Record Number: [id number] Patient Account Number: [id number] Date of Birth/Sex: Jun 27, 1930 (83 y.o. 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 I89.0 Lymphedema, not elsewhere classified M84.471G Pathological fracture, right ankle, subsequent encounter for fracture with delayed healing Facility Procedures CPT4: Description Modifier Quantity Code [id number] 11042 - DEB  SUBQ TISSUE 20 SQ CM/< 1 ICD-10 Description Diagnosis T81.31XA Disruption of external operation (surgical) wound, not elsewhere classified, initial encounter L97.311 Non-pressure chronic ulcer  of right ankle limited to breakdown of skin I89.0 Lymphedema, not elsewhere  classified Physician Procedures CPT4: Description Modifier Quantity Code [id number] 11042 - WC PHYS SUBQ TISS 20 SQ CM 1 ICD-10 Description Diagnosis T81.31XA Disruption of external operation (surgical) wound, not elsewhere classified, initial encounter H18.343 Non-pressure chronic ulcer  of right ankle limited to breakdown of skin I89.0 Lymphedema, not elsewhere classified Electronic Signature(s) Signed: 11/19/2016 4:14:06 PM By: Christin Fudge MD, FACS Entered By: Christin Fudge on 11/19/2016 16:14:06

## 2016-11-20 NOTE — Progress Notes (Addendum)
DELROSE, ROHWER ([id number]) Visit Report for 11/19/2016 Arrival Information Details Patient Name: Katherine [name], Katherine I. Date of Service: 11/19/2016 3:30 PM Medical Record Number: [id number] Patient Account Number: [id number] Date of Birth/Sex: 05-23-1930 (83 y.o. Female) Treating RN: Afful, RN, BSN, Velva Harman Primary Care Sayvon Arterberry: Thersa Salt Other Clinician: Referring Wileen Duncanson: Thersa Salt Treating Dwight Burdo/Extender: Frann Rider in Treatment: 6 Visit Information History Since Last Visit All ordered tests and consults were completed: No Patient Arrived: Wheel Chair Added or deleted any medications: No Arrival Time: 15:28 Any new allergies or adverse reactions: No Accompanied By: niece Had a fall or experienced change in No activities of daily living that may affect Transfer Assistance: None risk of falls: Patient Identification Verified: Yes Signs or symptoms of abuse/neglect since last No Secondary Verification Process Yes visito Completed: Hospitalized since last visit: No Patient Requires Transmission-Based No Has Dressing in Place as Prescribed: Yes Precautions: Has Compression in Place as Prescribed: Yes Patient Has Alerts: No Pain Present Now: No Electronic Signature(s) Signed: 11/19/2016 3:28:57 PM By: Regan Lemming BSN, RN Entered By: Regan Lemming on 11/19/2016 15:28:57 [name] ([id number]) -------------------------------------------------------------------------------- Encounter Discharge Information Details Patient Name: Katherine Juba I. Date of Service: 11/19/2016 3:30 PM Medical Record Number: [id number] Patient Account Number: [id number] Date of Birth/Sex: October 12, 1929 (83 y.o. Female) Treating RN: Baruch Gouty, RN, BSN, Velva Harman Primary Care Zania Kalisz: Thersa Salt Other Clinician: Referring Olon Russ: Thersa Salt Treating Rayla Pember/Extender: Frann Rider in Treatment: 6 Encounter Discharge Information Items Discharge Pain Level: 0 Discharge Condition: Stable Ambulatory  Status: Wheelchair Discharge Destination: Home Private Transportation: Auto Accompanied By: niece Schedule Follow-up Appointment: No Medication Reconciliation completed and No provided to Patient/Care Nicklas Mcsweeney: Clinical Summary of Care: Electronic Signature(s) Signed: 11/19/2016 4:21:39 PM By: Regan Lemming BSN, RN Entered By: Regan Lemming on 11/19/2016 15:59:24 [name] ([id number]) -------------------------------------------------------------------------------- Lower Extremity Assessment Details Patient Name: Katherine Juba I. Date of Service: 11/19/2016 3:30 PM Medical Record Number: [id number] Patient Account Number: [id number] Date of Birth/Sex: 07-Sep-1929 (83 y.o. Female) Treating RN: Afful, RN, BSN, [place] Primary Care Dawnetta Copenhaver: Thersa Salt Other Clinician: Referring Kensi Karr: Thersa Salt Treating Jalyiah Shelley/Extender: Frann Rider in Treatment: 6 Edema Assessment Assessed: [Left: No] [Right: No] Edema: [Left: N] [Right: o] Calf Left: Right: Point of Measurement: 32 cm From Medial Instep cm 35.2 cm Ankle Left: Right: Point of Measurement: 7 cm From Medial Instep cm 17.3 cm Vascular Assessment Claudication: Claudication Assessment [Right:None] Pulses: Dorsalis Pedis Palpable: [Right:Yes] Posterior Tibial Extremity colors, hair growth, and conditions: Extremity Color: [Right:Mottled] Hair Growth on Extremity: [Right:No] Temperature of Extremity: [Right:Warm] Capillary Refill: [Right:< 3 seconds] Toe Nail Assessment Left: Right: Thick: Yes Discolored: Yes Deformed: No Improper Length and Hygiene: No Electronic Signature(s) Signed: 11/19/2016 3:30:03 PM By: Regan Lemming BSN, RN Entered By: Regan Lemming on 11/19/2016 15:30:03 Katherine Juba I. ([id number]) Katherine [name], Katherine Oddi I. ([id number]) -------------------------------------------------------------------------------- Multi Wound Chart Details Patient Name: Katherine Juba I. Date of Service: 11/19/2016 3:30 PM Medical Record  Number: [id number] Patient Account Number: [id number] Date of Birth/Sex: 1929/10/07 (83 y.o. Female) Treating RN: Baruch Gouty, RN, BSN, Velva Harman Primary Care Jermario Kalmar: Thersa Salt Other Clinician: Referring Cecille Mcclusky: Thersa Salt Treating Emrie Gayle/Extender: Frann Rider in Treatment: 6 Vital Signs Height(in): 61 Pulse(bpm): 55 Weight(lbs): 140 Blood Pressure 110/83 (mmHg): Body Mass Index(BMI): 26 Temperature(F): 98.1 Respiratory Rate 16 (breaths/min): Photos: [1:No Photos] [N/A:N/A] Wound Location: [1:Right Lower Leg - Lateral] [N/A:N/A] Wounding Event: [1:Surgical Injury] [N/A:N/A] Primary Etiology: [1:Open Surgical Wound] [N/A:N/A] Comorbid History: [1:Congestive Heart Failure, Hypertension, Myocardial Infarction, Osteoarthritis, Neuropathy] [N/A:N/A] Date Acquired: [  1:08/24/2016] [N/A:N/A] Weeks of Treatment: [1:6] [N/A:N/A] Wound Status: [1:Open] [N/A:N/A] Measurements L x W x D 0.5x0.5x0.2 [N/A:N/A] (cm) Area (cm) : [1:0.196] [N/A:N/A] Volume (cm) : [1:0.039] [N/A:N/A] % Reduction in Area: [1:89.60%] [N/A:N/A] % Reduction in Volume: 94.80% [N/A:N/A] Classification: [1:Partial Thickness] [N/A:N/A] Exudate Amount: [1:Small] [N/A:N/A] Exudate Type: [1:Serous] [N/A:N/A] Exudate Color: [1:amber] [N/A:N/A] Wound Margin: [1:Distinct, outline attached] [N/A:N/A] Granulation Amount: [1:Large (67-100%)] [N/A:N/A] Granulation Quality: [1:Pale, Friable] [N/A:N/A] Necrotic Amount: [1:Small (1-33%)] [N/A:N/A] Exposed Structures: [1:Fat Layer (Subcutaneous Tissue) Exposed: Yes] [N/A:N/A] Epithelialization: [1:Medium (34-66%)] [N/A:N/A] Debridement: [1:Debridement ([id number])] [N/A:N/A] Pre-procedure 15:50 N/A N/A Verification/Time Out Taken: Pain Control: Lidocaine 4% Topical N/A N/A Solution Tissue Debrided: Fibrin/Slough, N/A N/A Subcutaneous Level: Skin/Subcutaneous N/A N/A Tissue Debridement Area (sq 0.25 N/A N/A cm): Instrument: Curette N/A N/A Bleeding: Minimum  N/A N/A Hemostasis Achieved: Pressure N/A N/A Procedural Pain: 0 N/A N/A Post Procedural Pain: 0 N/A N/A Debridement Treatment Procedure was tolerated N/A N/A Response: well Post Debridement 0.5x0.5x0.2 N/A N/A Measurements L x W x D (cm) Post Debridement 0.039 N/A N/A Volume: (cm) Periwound Skin Texture: Excoriation: No N/A N/A Induration: No Callus: No Crepitus: No Rash: No Scarring: No Periwound Skin Maceration: No N/A N/A Moisture: Dry/Scaly: No Periwound Skin Color: Atrophie Blanche: No N/A N/A Cyanosis: No Ecchymosis: No Erythema: No Hemosiderin Staining: No Mottled: No Pallor: No Rubor: No Temperature: No Abnormality N/A N/A Tenderness on Yes N/A N/A Palpation: Wound Preparation: Ulcer Cleansing: N/A N/A Rinsed/Irrigated with Saline, Other: surg scrub and water Topical Anesthetic Applied: Other: lidocaine 4% YARELIS, AMBROSINO I. ([id number]) Procedures Performed: Debridement N/A N/A Treatment Notes Wound #1 (Right, Lateral Lower Leg) 1. Cleansed with: Cleanse wound with antibacterial soap and water 4. Dressing Applied: Prisma Ag 5. Secondary Dressing Applied ABD Pad 7. Secured with 3 Layer Compression System - Right Lower Extremity Notes unna to anchor Electronic Signature(s) Signed: 11/19/2016 4:11:38 PM By: Christin Fudge MD, FACS Entered By: Christin Fudge on 11/19/2016 16:11:37 [name] ([id number]) -------------------------------------------------------------------------------- Herman Details Patient Name: Katherine Juba I. Date of Service: 11/19/2016 3:30 PM Medical Record Number: [id number] Patient Account Number: [id number] Date of Birth/Sex: [birth year] (83 y.o. Female) Treating RN: Afful, RN, BSN, Velva Harman Primary Care Tamecca Artiga: Thersa Salt Other Clinician: Referring Dezirea Mccollister: Thersa Salt Treating Artis Beggs/Extender: Frann Rider in Treatment: 6 Active Inactive ` Abuse / Safety / Falls / Self Care Management Nursing  Diagnoses: Potential for falls Goals: Patient will remain injury free Date Initiated: 10/07/2016 Target Resolution Date: 12/18/2016 Goal Status: Active Interventions: Assess fall risk on admission and as needed Assess impairment of mobility on admission and as needed per policy Notes: ` Nutrition Nursing Diagnoses: Imbalanced nutrition Goals: Patient/caregiver agrees to and verbalizes understanding of need to use nutritional supplements and/or vitamins as prescribed Date Initiated: 10/07/2016 Target Resolution Date: 12/18/2016 Goal Status: Active Interventions: Assess patient nutrition upon admission and as needed per policy Notes: ` Orientation to the Wound Care Program Nursing Diagnoses: Katherine [name], Katherine [name] ([id number]) Knowledge deficit related to the wound healing center program Goals: Patient/caregiver will verbalize understanding of the [place] Program Date Initiated: 10/07/2016 Target Resolution Date: 10/23/2016 Goal Status: Active Interventions: Provide education on orientation to the wound center Notes: ` Pain, Acute or Chronic Nursing Diagnoses: Pain, acute or chronic: actual or potential Potential alteration in comfort, pain Goals: Patient/caregiver will verbalize adequate pain control between visits Date Initiated: 10/07/2016 Target Resolution Date: 12/18/2016 Goal Status: Active Interventions: Assess comfort goal upon admission Complete pain assessment as per visit requirements Notes: `  Wound/Skin Impairment Nursing Diagnoses: Impaired tissue integrity Knowledge deficit related to smoking impact on wound healing Knowledge deficit related to ulceration/compromised skin integrity Goals: Ulcer/skin breakdown will have a volume reduction of 80% by week 12 Date Initiated: 10/07/2016 Target Resolution Date: 12/11/2016 Goal Status: Active Interventions: Assess patient/caregiver ability to perform ulcer/skin care regimen upon admission and as  needed Assess ulceration(s) every visit MAX, ROMANO ([id number]) Notes: Electronic Signature(s) Signed: 11/19/2016 4:21:39 PM By: Regan Lemming BSN, RN Entered By: Regan Lemming on 11/19/2016 15:47:41 Katherine Juba I. ([id number]) -------------------------------------------------------------------------------- Pain Assessment Details Patient Name: Katherine Juba I. Date of Service: 11/19/2016 3:30 PM Medical Record Number: [id number] Patient Account Number: [id number] Date of Birth/Sex: July 25, 1930 (83 y.o. Female) Treating RN: Baruch Gouty, RN, BSN, Velva Harman Primary Care Lynee Rosenbach: Thersa Salt Other Clinician: Referring Minetta Krisher: Thersa Salt Treating Tearia Gibbs/Extender: Frann Rider in Treatment: 6 Active Problems Location of Pain Severity and Description of Pain Patient Has Paino No Site Locations With Dressing Change: No Pain Management and Medication Current Pain Management: Electronic Signature(s) Signed: 11/19/2016 3:29:08 PM By: Regan Lemming BSN, RN Entered By: Regan Lemming on 11/19/2016 15:29:08 [name] ([id number]) -------------------------------------------------------------------------------- Patient/Caregiver Education Details Patient Name: Katherine Juba I. Date of Service: 11/19/2016 3:30 PM Medical Record Number: [id number] Patient Account Number: [id number] Date of Birth/Gender: [birth year] (83 y.o. Female) Treating RN: Baruch Gouty, RN, BSN, Velva Harman Primary Care Physician: Thersa Salt Other Clinician: Referring Physician: Thersa Salt Treating Physician/Extender: Frann Rider in Treatment: 6 Education Assessment Education Provided To: Patient Education Topics Provided Welcome To [facility] [place]: Methods: Explain/Verbal Responses: State content correctly Wound Debridement: Methods: Explain/Verbal Responses: State content correctly Wound/Skin Impairment: Methods: Explain/Verbal Responses: State content correctly Electronic Signature(s) Signed: 11/19/2016 4:21:39 PM By:  Regan Lemming BSN, RN Entered By: Regan Lemming on 11/19/2016 15:59:41 Katherine Juba I. ([id number]) -------------------------------------------------------------------------------- Wound Assessment Details Patient Name: Katherine Juba I. Date of Service: 11/19/2016 3:30 PM Medical Record Number: [id number] Patient Account Number: [id number] Date of Birth/Sex: 1929/11/06 (83 y.o. Female) Treating RN: Afful, RN, BSN, [place] Primary Care Kaisei Gilbo: Thersa Salt Other Clinician: Referring Luellen Howson: Thersa Salt Treating Chloee Tena/Extender: Frann Rider in Treatment: 6 Wound Status Wound Number: 1 Primary Open Surgical Wound Etiology: Wound Location: Right Lower Leg - Lateral Wound Open Wounding Event: Surgical Injury Status: Date Acquired: 08/24/2016 Comorbid Congestive Heart Failure, Weeks Of Treatment: 6 History: Hypertension, Myocardial Infarction, Clustered Wound: No Osteoarthritis, Neuropathy Photos Photo Uploaded By: Regan Lemming on 11/19/2016 16:20:35 Wound Measurements Length: (cm) 0.5 Width: (cm) 0.5 Depth: (cm) 0.2 Area: (cm) 0.196 Volume: (cm) 0.039 % Reduction in Area: 89.6% % Reduction in Volume: 94.8% Epithelialization: Medium (34-66%) Tunneling: No Undermining: No Wound Description Classification: Partial Thickness Foul Odor Aft Wound Margin: Distinct, outline attached Slough/Fibrin Exudate Amount: Small Exudate Type: Serous Exudate Color: amber er Cleansing: No o Yes Wound Bed Granulation Amount: Large (67-100%) Exposed Structure Granulation Quality: Pale, Friable Fat Layer (Subcutaneous Tissue) Exposed: Yes Necrotic Amount: Small (1-33%) Necrotic Quality: Adherent Slough [place], [place] I. ([id number]) Periwound Skin Texture Texture Color No Abnormalities Noted: No No Abnormalities Noted: No Callus: No Atrophie Blanche: No Crepitus: No Cyanosis: No Excoriation: No Ecchymosis: No Induration: No Erythema: No Rash: No Hemosiderin Staining: No Scarring:  No Mottled: No Pallor: No Moisture Rubor: No No Abnormalities Noted: No Dry / Scaly: No Temperature / Pain Maceration: No Temperature: No Abnormality Tenderness on Palpation: Yes Wound Preparation Ulcer Cleansing: Rinsed/Irrigated with Saline, Other: surg scrub and water, Topical Anesthetic Applied: Other: lidocaine 4%, Treatment Notes Wound #1 (Right, Lateral  Lower Leg) 1. Cleansed with: Cleanse wound with antibacterial soap and water 4. Dressing Applied: Prisma Ag 5. Secondary Dressing Applied ABD Pad 7. Secured with 3 Layer Compression System - Right Lower Extremity Notes unna to anchor Electronic Signature(s) Signed: 11/19/2016 4:21:39 PM By: Regan Lemming BSN, RN Entered By: Regan Lemming on 11/19/2016 15:42:36 Katherine Juba I. ([id number]) -------------------------------------------------------------------------------- Vitals Details Patient Name: Katherine Juba I. Date of Service: 11/19/2016 3:30 PM Medical Record Number: [id number] Patient Account Number: [id number] Date of Birth/Sex: 09/16/1929 (83 y.o. Female) Treating RN: Afful, RN, BSN, [place] Primary Care Mohd Clemons: Thersa Salt Other Clinician: Referring Aiyanah Kalama: Thersa Salt Treating Ayisha Pol/Extender: Frann Rider in Treatment: 6 Vital Signs Time Taken: 15:30 Temperature (F): 98.1 Height (in): 61 Pulse (bpm): 55 Weight (lbs): 140 Respiratory Rate (breaths/min): 16 Body Mass Index (BMI): 26.4 Blood Pressure (mmHg): 110/83 Reference Range: 80 - 120 mg / dl Electronic Signature(s) Signed: 11/19/2016 4:21:39 PM By: Regan Lemming BSN, RN Previous Signature: 11/19/2016 3:30:38 PM Version By: Regan Lemming BSN, RN Entered By: Regan Lemming on 11/19/2016 15:35:04

## 2016-11-22 ENCOUNTER — Ambulatory Visit (HOSPITAL_BASED_OUTPATIENT_CLINIC_OR_DEPARTMENT_OTHER): Payer: Medicare HMO | Admitting: Anesthesiology

## 2016-11-22 ENCOUNTER — Other Ambulatory Visit: Payer: Self-pay | Admitting: Anesthesiology

## 2016-11-22 ENCOUNTER — Ambulatory Visit
Admission: RE | Admit: 2016-11-22 | Discharge: 2016-11-22 | Disposition: A | Discharge status: Home / Self Care | Payer: Medicare HMO | Source: Ambulatory Visit | Attending: Anesthesiology | Admitting: Anesthesiology

## 2016-11-22 ENCOUNTER — Encounter: Payer: Self-pay | Admitting: Anesthesiology

## 2016-11-22 VITALS — BP 171/77 | HR 78 | Temp 98.2°F | Resp 15 | Ht 61.0 in | Wt 120.0 lb

## 2016-11-22 DIAGNOSIS — Z823 Family history of stroke: Secondary | ICD-10-CM | POA: Insufficient documentation

## 2016-11-22 DIAGNOSIS — S82851D Displaced trimalleolar fracture of right lower leg, subsequent encounter for closed fracture with routine healing: Secondary | ICD-10-CM | POA: Diagnosis not present

## 2016-11-22 DIAGNOSIS — M4316 Spondylolisthesis, lumbar region: Secondary | ICD-10-CM | POA: Diagnosis not present

## 2016-11-22 DIAGNOSIS — I2510 Atherosclerotic heart disease of native coronary artery without angina pectoris: Secondary | ICD-10-CM | POA: Diagnosis not present

## 2016-11-22 DIAGNOSIS — I252 Old myocardial infarction: Secondary | ICD-10-CM | POA: Diagnosis not present

## 2016-11-22 DIAGNOSIS — K5730 Diverticulosis of large intestine without perforation or abscess without bleeding: Secondary | ICD-10-CM | POA: Diagnosis not present

## 2016-11-22 DIAGNOSIS — M545 Low back pain: Secondary | ICD-10-CM | POA: Diagnosis present

## 2016-11-22 DIAGNOSIS — I255 Ischemic cardiomyopathy: Secondary | ICD-10-CM | POA: Insufficient documentation

## 2016-11-22 DIAGNOSIS — J449 Chronic obstructive pulmonary disease, unspecified: Secondary | ICD-10-CM | POA: Insufficient documentation

## 2016-11-22 DIAGNOSIS — M797 Fibromyalgia: Secondary | ICD-10-CM | POA: Diagnosis not present

## 2016-11-22 DIAGNOSIS — I110 Hypertensive heart disease with heart failure: Secondary | ICD-10-CM | POA: Insufficient documentation

## 2016-11-22 DIAGNOSIS — Z96641 Presence of right artificial hip joint: Secondary | ICD-10-CM | POA: Diagnosis not present

## 2016-11-22 DIAGNOSIS — Z7982 Long term (current) use of aspirin: Secondary | ICD-10-CM | POA: Diagnosis not present

## 2016-11-22 DIAGNOSIS — M069 Rheumatoid arthritis, unspecified: Secondary | ICD-10-CM | POA: Insufficient documentation

## 2016-11-22 DIAGNOSIS — M1990 Unspecified osteoarthritis, unspecified site: Secondary | ICD-10-CM | POA: Diagnosis not present

## 2016-11-22 DIAGNOSIS — M8580 Other specified disorders of bone density and structure, unspecified site: Secondary | ICD-10-CM | POA: Insufficient documentation

## 2016-11-22 DIAGNOSIS — R52 Pain, unspecified: Secondary | ICD-10-CM

## 2016-11-22 DIAGNOSIS — M48062 Spinal stenosis, lumbar region with neurogenic claudication: Secondary | ICD-10-CM | POA: Insufficient documentation

## 2016-11-22 DIAGNOSIS — I10 Essential (primary) hypertension: Secondary | ICD-10-CM | POA: Diagnosis not present

## 2016-11-22 DIAGNOSIS — I5042 Chronic combined systolic (congestive) and diastolic (congestive) heart failure: Secondary | ICD-10-CM | POA: Diagnosis not present

## 2016-11-22 DIAGNOSIS — I4891 Unspecified atrial fibrillation: Secondary | ICD-10-CM | POA: Insufficient documentation

## 2016-11-22 DIAGNOSIS — Z803 Family history of malignant neoplasm of breast: Secondary | ICD-10-CM | POA: Diagnosis not present

## 2016-11-22 DIAGNOSIS — E785 Hyperlipidemia, unspecified: Secondary | ICD-10-CM | POA: Insufficient documentation

## 2016-11-22 DIAGNOSIS — M961 Postlaminectomy syndrome, not elsewhere classified: Secondary | ICD-10-CM

## 2016-11-22 DIAGNOSIS — Z8249 Family history of ischemic heart disease and other diseases of the circulatory system: Secondary | ICD-10-CM | POA: Insufficient documentation

## 2016-11-22 DIAGNOSIS — K219 Gastro-esophageal reflux disease without esophagitis: Secondary | ICD-10-CM | POA: Insufficient documentation

## 2016-11-22 DIAGNOSIS — R69 Illness, unspecified: Secondary | ICD-10-CM | POA: Diagnosis not present

## 2016-11-22 DIAGNOSIS — N281 Cyst of kidney, acquired: Secondary | ICD-10-CM | POA: Insufficient documentation

## 2016-11-22 MED ORDER — MIDAZOLAM HCL 2 MG/2ML IJ SOLN
5.0000 mg | Freq: Once | INTRAMUSCULAR | Status: AC
  Administered 2016-11-22: 1 mg via INTRAVENOUS

## 2016-11-22 MED ORDER — SODIUM CHLORIDE 0.9% FLUSH
10.0000 mL | Freq: Once | INTRAVENOUS | Status: DC
Start: 2016-11-22 — End: 2016-11-23

## 2016-11-22 MED ORDER — LIDOCAINE HCL (PF) 1 % IJ SOLN
5.0000 mL | Freq: Once | INTRAMUSCULAR | Status: AC
  Administered 2016-11-22: 5 mL via SUBCUTANEOUS
  Filled 2016-11-22: qty 5

## 2016-11-22 MED ORDER — ROPIVACAINE HCL 2 MG/ML IJ SOLN
10.0000 mL | Freq: Once | INTRAMUSCULAR | Status: AC
  Administered 2016-11-22: 10 mL via EPIDURAL
  Filled 2016-11-22: qty 10

## 2016-11-22 MED ORDER — SODIUM CHLORIDE 0.9 % IJ SOLN
INTRAMUSCULAR | Status: AC
  Filled 2016-11-22: qty 10

## 2016-11-22 MED ORDER — LIDOCAINE HCL (PF) 1 % IJ SOLN
INTRAMUSCULAR | Status: AC
  Filled 2016-11-22: qty 5

## 2016-11-22 MED ORDER — TRIAMCINOLONE ACETONIDE 40 MG/ML IJ SUSP
40.0000 mg | Freq: Once | INTRAMUSCULAR | Status: AC
  Administered 2016-11-22: 40 mg
  Filled 2016-11-22: qty 1

## 2016-11-22 MED ORDER — ROPIVACAINE HCL 2 MG/ML IJ SOLN
INTRAMUSCULAR | Status: AC
  Filled 2016-11-22: qty 10

## 2016-11-22 MED ORDER — LACTATED RINGERS IV SOLN
1000.0000 mL | INTRAVENOUS | Status: DC
  Administered 2016-11-22: 1000 mL via INTRAVENOUS

## 2016-11-22 MED ORDER — IOPAMIDOL (ISOVUE-M 200) INJECTION 41%
INTRAMUSCULAR | Status: AC
  Filled 2016-11-22: qty 10

## 2016-11-22 MED ORDER — MIDAZOLAM HCL 5 MG/5ML IJ SOLN
INTRAMUSCULAR | Status: AC
  Filled 2016-11-22: qty 5

## 2016-11-22 MED ORDER — IOPAMIDOL (ISOVUE-M 200) INJECTION 41%
20.0000 mL | Freq: Once | INTRAMUSCULAR | Status: DC | PRN
  Administered 2016-11-22: 10 mL
  Filled 2016-11-22: qty 20

## 2016-11-22 NOTE — Progress Notes (Signed)
Safety precautions to be maintained throughout the outpatient stay will include: orient to surroundings, keep bed in low position, maintain call bell within reach at all times, provide assistance with transfer out of bed and ambulation.  

## 2016-11-22 NOTE — Patient Instructions (Signed)

## 2016-11-23 ENCOUNTER — Telehealth: Payer: Self-pay | Admitting: *Deleted

## 2016-11-23 NOTE — Progress Notes (Signed)
Subjective:  Patient ID: Katherine [name], female    DOB: 01-Dec-1929  Age: 83 y.o. MRN: [id number]  CC: Back Pain (lower)  Procedure today: Caudal epidural steroid No. 1 under fluoroscopic guidance with moderate sedation Previous Caudal epidural steroid 2017 under fluoroscopic guidance and moderate sedation Previous PROCEDURE last visit :  Caudal epidural steroid No. 1 of series with fluoroscopic guidance and moderate sedation  HPI Katherine [name] presents for caudal epidural steroid. The quality characteristic distribution of her pain are stable from her last visit. She continues to have pain that radiates into the lower extremities and low back with bilateral hip pain. Her last caudal back in 2017 gave her approximately 5 months of significant improvement following her injection. She desires to proceed with a repeat injection today.   She has a long-standing history of diffuse body pain including low back pain. The pain she experiences rates at a VAS of 10 and best 7 but has gradually gotten worse to the point where she has soft hypotension. She has pain in the afternoon and night and with immobility. Aggravating factors include bending climbing kneeling motion sitting standing squatting and twisting. Alleviating factors include rest sleep lying down and medication management. In the past she has had epidural steroid injections which seem to help alleviate some of her low back pain.  Currently the pain complaints she experiences is similar to what she had before with associated tingling worse on the left posterior and lateral leg with associated weakness and pain that keeps her up at night. It is described as aching and cramping deep disabling. She has had an MRI which reveals multilevel facet degeneration in the lumbosacral spine and evidence of multilevel degenerative disc disease.  History Starlee has a past medical history of Arthritis; CHF (congestive heart failure) ([place]); Chicken pox; Chronic  combined systolic and diastolic heart failure (HCC); COPD (chronic obstructive pulmonary disease) ([place]); GERD (gastroesophageal reflux disease); Hyperlipidemia; Hypertension; Hypertensive heart disease; Ischemic cardiomyopathy; and Occlusive coronary artery disease requiring drug therapy.   She has a past surgical history that includes Shoulder surgery; Cardiac catheterization (N/A, 10/30/2015); Cardiac catheterization (N/A, 10/30/2015); Total hip arthroplasty (Right, 2012); Back surgery (2010); Amputation finger (Left, 1966); Appendectomy (1961); Tonsillectomy (1953); Abdominal hysterectomy (1985); and ORIF ankle fracture (Right, 08/24/2016).   Her family history includes Arthritis in her sister and sister; Breast cancer in her sister; CAD in her brother and sister; Heart disease in her brother, brother, brother, brother, brother, brother, mother, sister, sister, sister, and sister; Hyperlipidemia in her mother; Hypertension in her brother, brother, brother, brother, brother, brother, mother, sister, sister, sister, and sister; Lung cancer in her mother; Stroke in her mother, sister, sister, sister, and sister.She reports that she has never smoked. She has never used smokeless tobacco. She reports that she does not drink alcohol or use drugs.  No results found for this or any previous visit.  No results found for: TOXASSSELUR  Outpatient Medications Prior to Visit  Medication Sig Dispense Refill  . aspirin EC 81 MG tablet Take 325 mg by mouth daily.     Marland Kitchen atorvastatin (LIPITOR) 80 MG tablet Take 1 tablet (80 mg total) by mouth daily. 90 tablet 3  . bisacodyl (DULCOLAX) 10 MG suppository Place 1 suppository (10 mg total) rectally daily as needed for moderate constipation. 12 suppository 0  . carvedilol (COREG) 6.25 MG tablet Take 1 tablet (6.25 mg total) by mouth 2 (two) times daily with a meal. 180 tablet 3  . ferrous  sulfate 325 (65 FE) MG tablet Take 1 tablet (325 mg total) by mouth 3 (three) times  daily after meals. 90 tablet 3  . furosemide (LASIX) 20 MG tablet Take 1 tablet (20 mg total) by mouth every other day. 90 tablet 3  . HYDROcodone-acetaminophen (NORCO/VICODIN) 5-325 MG tablet Take 1 tablet by mouth every 6 (six) hours as needed for moderate pain or severe pain. 30 tablet 0  . Lactobacillus-Inulin (Luckey) CAPS Take 1 capsule by mouth every morning. 30 capsule 0  . lisinopril (PRINIVIL,ZESTRIL) 20 MG tablet Take 20 mg by mouth daily.    . multivitamin-iron-minerals-folic acid (CENTRUM) chewable tablet Chew 1 tablet by mouth daily.    . nitroGLYCERIN (NITROSTAT) 0.4 MG SL tablet Place 1 tablet (0.4 mg total) under the tongue every 5 (five) minutes as needed for chest pain. 25 tablet 3  . omega-3 acid ethyl esters (LOVAZA) 1 g capsule Take 1 g by mouth daily.    . pantoprazole (PROTONIX) 40 MG tablet Take 1 tablet (40 mg total) by mouth daily. 90 tablet 2  . polyethylene glycol (MIRALAX / GLYCOLAX) packet Take 17 g by mouth daily as needed for mild constipation. 14 each 0  . pregabalin (LYRICA) 75 MG capsule Take 1 capsule (75 mg total) by mouth 2 (two) times daily. 180 capsule 3  . sertraline (ZOLOFT) 100 MG tablet Take 1 tablet (100 mg total) by mouth daily. 90 tablet 3  . thiamine (VITAMIN B-1) 100 MG tablet Take 1 tablet (100 mg total) by mouth daily. 90 tablet 1   No facility-administered medications prior to visit.    Lab Results  Component Value Date   WBC 12.0 (H) 10/28/2016   HGB 12.2 10/28/2016   HCT 36.8 10/28/2016   PLT 163 10/28/2016   GLUCOSE 117 (H) 10/28/2016   CHOL 114 12/16/2015   TRIG 126 12/16/2015   HDL 41 12/16/2015   LDLCALC 48 12/16/2015   ALT 25 10/28/2016   AST 46 (H) 10/28/2016   NA 138 10/28/2016   K 3.9 10/28/2016   CL 104 10/28/2016   CREATININE 0.82 10/28/2016   BUN 11 10/28/2016   CO2 28 10/28/2016   TSH 0.345 (L) 10/30/2015   INR 1.12 10/30/2015   HGBA1C 6.0 (H) 10/30/2015     --------------------------------------------------------------------------------------------------------------------- Dg Knee 1-2 Views Left  01/07/2016  CLINICAL DATA:  Left anterior knee pain beginning today.  No injury. EXAM: LEFT KNEE - 1-2 VIEW COMPARISON:  None. FINDINGS: No evidence of fracture, dislocation, or joint effusion. Chondrocalcinosis. No notable degenerative changes for age. Osteopenia. IMPRESSION: No acute finding. Electronically Signed   By: Monte Fantasia M.D.   On: 01/07/2016 20:00   Mr Lumbar Spine Wo Contrast  01/08/2016  CLINICAL DATA:  Low back pain with left thigh pain radiating down to the left foot EXAM: MRI LUMBAR SPINE WITHOUT CONTRAST TECHNIQUE: Multiplanar, multisequence MR imaging of the lumbar spine was performed. No intravenous contrast was administered. COMPARISON:  None. FINDINGS: Segmentation:  Standard. Alignment: 8 mm of anterolisthesis of L4 on L5 secondary to bilateral L4 pars interarticularis defects. The alignment is otherwise anatomic. Vertebrae:  No fracture, evidence of discitis, or bone lesion. Conus medullaris: Extends to the T12-L1 level and appears normal. Paraspinal and other soft tissues: Negative. Disc levels: Disc spaces: Degenerative disc disease with disc height loss at L3-4, L4-5 and L5-S1. T11-12: Mild broad-based disc bulge. Mild bilateral facet arthropathy. No foraminal or central canal stenosis. T12-L1: No significant disc bulge. No evidence of  neural foraminal stenosis. No central canal stenosis. L1-L2: Mild broad-based disc bulge. Mild bilateral facet arthropathy. No evidence of neural foraminal stenosis. No central canal stenosis. L2-L3: Mild broad-based disc bulge. Moderate right facet arthropathy. Mild right foraminal stenosis. Right lateral recess stenosis. No central canal stenosis. L3-L4: Mild broad-based disc bulge. Mild bilateral facet arthropathy. Mild right foraminal stenosis. Right lateral recess stenosis. No left foraminal  stenosis. No central canal stenosis. L4-L5: No disc protrusion. Grade 1 anterolisthesis of L4 on L5. Severe bilateral facet arthropathy and bilateral L4 pars interarticularis defects. Severe bilateral foraminal stenosis. Mild spinal stenosis. L5-S1: Mild broad-based disc bulge. Mild bilateral facet arthropathy. Mild left foraminal stenosis. No central canal stenosis. IMPRESSION: 1. At L4-5 there is grade 1 anterolisthesis of L4 on L5. Severe bilateral facet arthropathy and bilateral L4 pars interarticularis defects. Severe bilateral foraminal stenosis. Mild spinal stenosis. 2. At L2-3 there is a mild broad-based disc bulge. Moderate right facet arthropathy. Mild right foraminal stenosis. Right lateral recess stenosis. 3. At L3-4 there is a mild Mild broad-based disc bulge. Mild bilateral facet arthropathy. Mild right foraminal stenosis. Right lateral recess stenosis. Electronically Signed   By: Kathreen Devoid   On: 01/08/2016 09:27   Ct Cta Abd/pel W/cm &/or W/o Cm  01/07/2016  ADDENDUM REPORT: 01/07/2016 19:54 ADDENDUM: The there are a few small bilateral sub cm renal cortical hypodensities too small to characterize but likely cysts. Electronically Signed   By: Marin Olp M.D.   On: 01/07/2016 19:54  01/07/2016  CLINICAL DATA:  Sciatica.  Possible dehydration. EXAM: CTA ABDOMEN AND PELVIS wITHOUT AND WITH CONTRAST TECHNIQUE: Multidetector CT imaging of the abdomen and pelvis was performed using the standard protocol during bolus administration of intravenous contrast. Multiplanar reconstructed images and MIPs were obtained and reviewed to evaluate the vascular anatomy. CONTRAST:  100 mL Isovue 370 IV COMPARISON:  None. FINDINGS: Lung bases are within normal. Abdominal images demonstrate multiple splenic granulomas. The liver, pancreas, gallbladder and adrenal glands are normal. Kidneys are normal in size without hydronephrosis or nephrolithiasis. There is a 2.3 cm cyst over the mid pole the right kidney.  Ureters are within normal. There is mild calcified plaque over the abdominal aorta iliac arteries. Mild focal stenosis at the takeoff of the celiac axis for comparison Appendix is not well visualized. Minimal diverticulosis of the colon which is otherwise within normal. Small bowel is within normal. Mesentery is unremarkable without inflammatory change or free fluid. Pelvic images demonstrate mild bladder distension. Surgical absence of the uterus. No free pelvic fluid. Right hip arthroplasty intact. There are degenerative changes of the spine with multilevel disc disease over the lumbosacral spine mean grade 1-2 anterolisthesis of L4 on L5. Previous L3-L5 laminectomy. Several metallic densities over the inguinal regions. Review of the MIP images confirms the above findings. IMPRESSION: No acute findings in the abdomen/ pelvis. 2.3 cm right renal cyst. Minimal diverticulosis of the colon. Significant degenerative changes with multilevel disc disease over the lumbosacral spine. Grade 1-2 anterolisthesis L4 on L5. Electronically Signed: By: Marin Olp M.D. On: 01/07/2016 18:26       ---------------------------------------------------------------------------------------------------------------------- Past Medical History:  Diagnosis Date  . Arthritis   . CHF (congestive heart failure) ([place])   . Chicken pox   . Chronic combined systolic and diastolic heart failure ([place])    a. 10/2015 Echo: EF 35-40%, Akinesis of mid-apicalanteroseptal wall & AK of apex, Gr 1DD.  Marland Kitchen COPD (chronic obstructive pulmonary disease) ([place])    worked in a [place]  .  GERD (gastroesophageal reflux disease)   . Hyperlipidemia   . Hypertension   . Hypertensive heart disease   . Ischemic cardiomyopathy    a. 10/2015 Echo: EF 35-40%, Akinesis of mid-apicalanteroseptal wall & AK of apex, Gr 1DD.  Marland Kitchen Occlusive coronary artery disease requiring drug therapy    a. Remote MI - never sought treatment when it occurred, shows up old on  EKG;  b. Cath 10/2015 - 100% pLAD CTO with 80% D1, prox Cx 60% (FFR 0.92-->med Rx) - EF ~35%    Past Surgical History:  Procedure Laterality Date  . ABDOMINAL HYSTERECTOMY  1985  . AMPUTATION FINGER Left 1966   ring finger  . APPENDECTOMY  1961  . BACK SURGERY  2010   lower back  . CARDIAC CATHETERIZATION N/A 10/30/2015   Procedure: Right/Left Heart Cath and Coronary Angiography;  Surgeon: Wellington Hampshire, MD;  Location: [facility];  Service: Cardiovascular;  Laterality: N/A;  . CARDIAC CATHETERIZATION N/A 10/30/2015   Procedure: Intravascular Pressure Wire/FFR Study;  Surgeon: Wellington Hampshire, MD;  Location: [facility];  Service: Cardiovascular;  Laterality: N/A;  . ORIF ANKLE FRACTURE Right 08/24/2016   Procedure: OPEN REDUCTION INTERNAL FIXATION (ORIF) ANKLE FRACTURE;  Surgeon: Thornton Park, MD;  Location: [facility];  Service: Orthopedics;  Laterality: Right;  . SHOULDER SURGERY    . TONSILLECTOMY  1953  . TOTAL HIP ARTHROPLASTY Right 2012    Family History  Problem Relation Age of Onset  . Heart disease Mother   . Stroke Mother   . Hypertension Mother   . Lung cancer Mother   . Hyperlipidemia Mother   . CAD Sister   . Heart disease Sister   . Stroke Sister   . Hypertension Sister   . Arthritis Sister   . CAD Brother   . Heart disease Brother   . Hypertension Brother   . Heart disease Sister   . Stroke Sister   . Hypertension Sister   . Arthritis Sister   . Heart disease Sister   . Stroke Sister   . Hypertension Sister   . Breast cancer Sister   . Heart disease Sister   . Stroke Sister   . Hypertension Sister   . Heart disease Brother   . Hypertension Brother   . Heart disease Brother   . Hypertension Brother   . Heart disease Brother   . Hypertension Brother   . Heart disease Brother   . Hypertension Brother   . Heart disease Brother   . Hypertension Brother     Social History  Substance Use Topics  . Smoking status: Never Smoker  .  Smokeless tobacco: Never Used  . Alcohol use No    ---------------------------------------------------------------------------------------------------------------------- Social History   Social History  . Marital status: Single    Spouse name: N/A  . Number of children: N/A  . Years of education: N/A   Social History Main Topics  . Smoking status: Never Smoker  . Smokeless tobacco: Never Used  . Alcohol use No  . Drug use: No  . Sexual activity: No   Other Topics Concern  . None   Social History Narrative   Patient lives in [place] but visits her family in [place] frequently. She is very active and walks often.    Scheduled Meds: Continuous Infusions: PRN Meds:.   BP (!) 171/77   Pulse 78   Temp 98.2 F (36.8 C) (Temporal)   Resp 15   Ht 5\' 1"  (  1.549 m)   Wt 120 lb (54.4 kg)   SpO2 96%   BMI 22.67 kg/m    BP Readings from Last 3 Encounters:  11/22/16 (!) 171/77  11/18/16 (!) 167/76  11/16/16 (!) 160/70     Wt Readings from Last 3 Encounters:  11/22/16 120 lb (54.4 kg)  11/18/16 119 lb (54 kg)  11/16/16 119 lb 4 oz (54.1 kg)     ----------------------------------------------------------------------------------------------------------------------  ROS Review of Systems  Cardiac: History of A. fib on a daily aspirin taking Plavix this was DC'd approximately 10 days ago. Also high blood pressure and history of heart attack in March 2017. History of congestive heart failure previous heart catheterization. Pulmonary: Lung problems with history of shortness of breath Neurologic: Scoliosis and chronic low back pain Psychologic: Negative GI reflux Hematologic easy bruising and bleeding Rheumatologic history of osteoarthritis fibromyalgia and chronic fatigue syndrome and rheumatoid arthritis  Objective:  BP (!) 171/77   Pulse 78   Temp 98.2 F (36.8 C) (Temporal)   Resp 15   Ht 5\' 1"  (1.549 m)   Wt 120 lb (54.4 kg)   SpO2 96%   BMI 22.67 kg/m    Physical Exam Pupils are equally round reactive to light with extraocular muscles intact Patient is a good historian appropriate cooperative compliant Heart is irregular with appropriate right    Assessment & Plan:   Mescal was seen today for back pain.  Diagnoses and all orders for this visit:  Spinal stenosis of lumbar region with neurogenic claudication -     Lumbar Epidural Injection -     triamcinolone acetonide (KENALOG-40) injection 40 mg; 1 mL (40 mg total) by Other route once. -     sodium chloride flush (NS) 0.9 % injection 10 mL; 10 mLs by Other route once. -     ropivacaine (PF) 2 mg/mL (0.2%) (NAROPIN) injection 10 mL; 10 mLs by Epidural route once. -     midazolam (VERSED) injection 5 mg; Inject 5 mLs (5 mg total) into the vein once. -     lidocaine (PF) (XYLOCAINE) 1 % injection 5 mL; Inject 5 mLs into the skin once. -     lactated ringers infusion 1,000 mL; Inject 1,000 mLs into the vein continuous. -     iopamidol (ISOVUE-M) 41 % intrathecal injection 20 mL; 20 mLs by Other route once as needed for contrast.  Failed back syndrome of lumbar spine     ----------------------------------------------------------------------------------------------------------------------  Problem List Items Addressed This Visit    None    Visit Diagnoses    Spinal stenosis of lumbar region with neurogenic claudication    -  Primary   Relevant Medications   triamcinolone acetonide (KENALOG-40) injection 40 mg (Completed)   sodium chloride flush (NS) 0.9 % injection 10 mL   ropivacaine (PF) 2 mg/mL (0.2%) (NAROPIN) injection 10 mL (Completed)   midazolam (VERSED) injection 5 mg (Completed)   lidocaine (PF) (XYLOCAINE) 1 % injection 5 mL (Completed)   lactated ringers infusion 1,000 mL   iopamidol (ISOVUE-M) 41 % intrathecal injection 20 mL   Failed back syndrome of lumbar spine           ----------------------------------------------------------------------------------------------------------------------  1. Spinal stenosis of lumbar region  We'll proceed with a caudal epidural steroid today. The risks and benefits been once again reviewed all questions answered. We'll have return to clinic in approximately 4 months following an upcoming trip. At that point we may proceed with a repeat caudal for her spinal stenosis  and lower leg pain.  2. Failed back syndrome of lumbar spine We have talked about the importance of sparing use of this medication and the risks and benefits of it as well.     ----------------------------------------------------------------------------------------------------------------------  I am having Ms. Carton maintain her aspirin EC, nitroGLYCERIN, omega-3 acid ethyl esters, carvedilol, sertraline, multivitamin-iron-minerals-folic acid, pantoprazole, bisacodyl, ferrous sulfate, polyethylene glycol, thiamine, [employer], lisinopril, atorvastatin, furosemide, pregabalin, and HYDROcodone-acetaminophen. We administered triamcinolone acetonide, ropivacaine (PF) 2 mg/mL (0.2%), midazolam, lidocaine (PF), lactated ringers, and iopamidol.   Meds ordered this encounter  Medications  . triamcinolone acetonide (KENALOG-40) injection 40 mg  . sodium chloride flush (NS) 0.9 % injection 10 mL  . ropivacaine (PF) 2 mg/mL (0.2%) (NAROPIN) injection 10 mL  . midazolam (VERSED) injection 5 mg  . lidocaine (PF) (XYLOCAINE) 1 % injection 5 mL  . lactated ringers infusion 1,000 mL  . iopamidol (ISOVUE-M) 41 % intrathecal injection 20 mL   Follow-up: Return in about 4 months (around 03/24/2017) for evaluation, procedure.   Caudal epidural steroid under fluoroscopic as moderate sedation  After informed consent was obtained and the risks benefits reviewed patient chose to pursue a caudal epidural steroid injection today. With the patient in the prone  position and an IV in place, sedation was with IV 1 mg versed. Using AP and lateral fluoroscopic guidance I identified the area overlying the sacral hiatus. This area was broadly prepped with DuraPrep 3 and we utilized strict aseptic technique during the procedure. 1% lidocaine was infiltrated 2 cc using a 25-gauge needle overlying the sacral cornu and then using lateral fluoroscopic guidance I advanced an 18-gauge Touhy needle approximately 2 cm through the sacral hiatus. Confirmation was with 2 cc of Isovue yielding good epidural spread and no evidence of IV or subarachnoid uptake. This was followed by an injection of 5 cc of saline mixed with 1 cc of ropivacaine 0.2% and 40 mg of triamcinolone. This was tolerated without difficulty the patient was convalesced and discharged home in stable condition for follow-up as mentioned. Colman Cater, MD  This dictation was performed utilizing Dragon voice recognition software.  Please excuse any unintentional or mistaken typographical errors as a result of its unedited utilization.

## 2016-11-23 NOTE — Telephone Encounter (Signed)
Family member states she has gone to a doctors appointment. States she will tell her to call us if she is having any problems.

## 2016-11-25 ENCOUNTER — Encounter: Payer: Medicare HMO | Admitting: Surgery

## 2016-11-25 DIAGNOSIS — M1990 Unspecified osteoarthritis, unspecified site: Secondary | ICD-10-CM | POA: Diagnosis not present

## 2016-11-25 DIAGNOSIS — T8131XA Disruption of external operation (surgical) wound, not elsewhere classified, initial encounter: Secondary | ICD-10-CM | POA: Diagnosis not present

## 2016-11-25 DIAGNOSIS — I110 Hypertensive heart disease with heart failure: Secondary | ICD-10-CM | POA: Diagnosis not present

## 2016-11-25 DIAGNOSIS — Y839 Surgical procedure, unspecified as the cause of abnormal reaction of the patient, or of later complication, without mention of misadventure at the time of the procedure: Secondary | ICD-10-CM | POA: Diagnosis not present

## 2016-11-25 DIAGNOSIS — I509 Heart failure, unspecified: Secondary | ICD-10-CM | POA: Diagnosis not present

## 2016-11-25 DIAGNOSIS — L97311 Non-pressure chronic ulcer of right ankle limited to breakdown of skin: Secondary | ICD-10-CM | POA: Diagnosis not present

## 2016-11-25 DIAGNOSIS — T8189XA Other complications of procedures, not elsewhere classified, initial encounter: Secondary | ICD-10-CM | POA: Diagnosis not present

## 2016-11-25 DIAGNOSIS — G629 Polyneuropathy, unspecified: Secondary | ICD-10-CM | POA: Diagnosis not present

## 2016-11-25 DIAGNOSIS — I890 Lymphedema, not elsewhere classified: Secondary | ICD-10-CM | POA: Diagnosis not present

## 2016-11-25 DIAGNOSIS — I252 Old myocardial infarction: Secondary | ICD-10-CM | POA: Diagnosis not present

## 2016-11-25 DIAGNOSIS — M84471G Pathological fracture, right ankle, subsequent encounter for fracture with delayed healing: Secondary | ICD-10-CM | POA: Diagnosis not present

## 2016-11-26 DIAGNOSIS — K219 Gastro-esophageal reflux disease without esophagitis: Secondary | ICD-10-CM | POA: Diagnosis not present

## 2016-11-26 DIAGNOSIS — J449 Chronic obstructive pulmonary disease, unspecified: Secondary | ICD-10-CM | POA: Diagnosis not present

## 2016-11-26 DIAGNOSIS — R69 Illness, unspecified: Secondary | ICD-10-CM | POA: Diagnosis not present

## 2016-11-26 DIAGNOSIS — S82851D Displaced trimalleolar fracture of right lower leg, subsequent encounter for closed fracture with routine healing: Secondary | ICD-10-CM | POA: Diagnosis not present

## 2016-11-26 DIAGNOSIS — I255 Ischemic cardiomyopathy: Secondary | ICD-10-CM | POA: Diagnosis not present

## 2016-11-26 DIAGNOSIS — I110 Hypertensive heart disease with heart failure: Secondary | ICD-10-CM | POA: Diagnosis not present

## 2016-11-26 DIAGNOSIS — M1990 Unspecified osteoarthritis, unspecified site: Secondary | ICD-10-CM | POA: Diagnosis not present

## 2016-11-26 DIAGNOSIS — E785 Hyperlipidemia, unspecified: Secondary | ICD-10-CM | POA: Diagnosis not present

## 2016-11-26 DIAGNOSIS — I5042 Chronic combined systolic (congestive) and diastolic (congestive) heart failure: Secondary | ICD-10-CM | POA: Diagnosis not present

## 2016-11-26 DIAGNOSIS — I2510 Atherosclerotic heart disease of native coronary artery without angina pectoris: Secondary | ICD-10-CM | POA: Diagnosis not present

## 2016-11-27 NOTE — Progress Notes (Signed)
RELENA, IVANCIC ([id number]) Visit Report for 11/25/2016 Arrival Information Details Patient Name: Katherine [name], Katherine [name] I. Date of Service: 11/25/2016 3:45 PM Medical Record Number: [id number] Patient Account Number: [id number] Date of Birth/Sex: September 11, 1929 (83 y.o. Female) Treating RN: Cornell Barman Primary Care Markia Kyer: Thersa Salt Other Clinician: Referring Temeka Pore: Thersa Salt Treating Maelie Chriswell/Extender: Frann Rider in Treatment: 7 Visit Information History Since Last Visit Any new allergies or adverse reactions: No Patient Arrived: Wheel Chair Had a fall or experienced change in No activities of daily living that may affect Arrival Time: 15:40 risk of falls: Accompanied By: neice Signs or symptoms of abuse/neglect since last No Transfer Assistance: Manual visito Patient Identification Verified: Yes Hospitalized since last visit: No Secondary Verification Process Yes Has Dressing in Place as Prescribed: Yes Completed: Has Compression in Place as Prescribed: Yes Patient Requires Transmission-Based No Pain Present Now: No Precautions: Patient Has Alerts: No Electronic Signature(s) Signed: 11/25/2016 5:02:27 PM By: Gretta Cool, RN, BSN, Kim RN, BSN Entered By: Gretta Cool, RN, BSN, Kim on 11/25/2016 15:45:49 [name] ([id number]) -------------------------------------------------------------------------------- Encounter Discharge Information Details Patient Name: Katherine Juba I. Date of Service: 11/25/2016 3:45 PM Medical Record Number: [id number] Patient Account Number: [id number] Date of Birth/Sex: Mar 30, 1930 (83 y.o. Female) Treating RN: Baruch Gouty, RN, BSN, Velva Harman Primary Care Lillyonna Armstead: Thersa Salt Other Clinician: Referring Cataleya Cristina: Thersa Salt Treating Angy Swearengin/Extender: Frann Rider in Treatment: 7 Encounter Discharge Information Items Discharge Pain Level: 0 Discharge Condition: Stable Ambulatory Status: Wheelchair Discharge Destination: Home Transportation: Private  Auto Accompanied By: neice Schedule Follow-up Appointment: Yes Medication Reconciliation completed and provided to Patient/Care Yes Mava Suares: Provided on Clinical Summary of Care: 11/25/2016 Form Type Recipient Paper Patient DK Electronic Signature(s) Signed: 11/25/2016 5:02:27 PM By: Gretta Cool RN, BSN, Kim RN, BSN Previous Signature: 11/25/2016 4:21:18 PM Version By: Ruthine Dose Entered By: Gretta Cool RN, BSN, Kim on 11/25/2016 16:31:10 Katherine Juba I. ([id number]) -------------------------------------------------------------------------------- Lower Extremity Assessment Details Patient Name: Katherine Juba I. Date of Service: 11/25/2016 3:45 PM Medical Record Number: [id number] Patient Account Number: [id number] Date of Birth/Sex: 05-19-1930 (83 y.o. Female) Treating RN: Cornell Barman Primary Care Kaipo Ardis: Thersa Salt Other Clinician: Referring Colin Norment: Thersa Salt Treating Julianna Vanwagner/Extender: Frann Rider in Treatment: 7 Edema Assessment Assessed: [Left: No] [Right: No] E[Left: dema] [Right: :] Calf Left: Right: Point of Measurement: 32 cm From Medial Instep cm 32.4 cm Ankle Left: Right: Point of Measurement: 7 cm From Medial Instep cm 20 cm Vascular Assessment Pulses: Dorsalis Pedis Palpable: [Right:Yes] Posterior Tibial Extremity colors, hair growth, and conditions: Extremity Color: [Right:Normal] Hair Growth on Extremity: [Right:Yes] Temperature of Extremity: [Right:Warm] Capillary Refill: [Right:< 3 seconds] Toe Nail Assessment Left: Right: Thick: No Discolored: No Deformed: No Improper Length and Hygiene: No Electronic Signature(s) Signed: 11/25/2016 5:02:27 PM By: Gretta Cool, RN, BSN, Kim RN, BSN Entered By: Gretta Cool, RN, BSN, Kim on 11/25/2016 Katherine [name], Whitney. ([id number]) -------------------------------------------------------------------------------- Multi Wound Chart Details Patient Name: Katherine Juba I. Date of Service: 11/25/2016 3:45 PM Medical Record  Number: [id number] Patient Account Number: [id number] Date of Birth/Sex: [birth year] (83 y.o. Female) Treating RN: Cornell Barman Primary Care Bridie Colquhoun: Thersa Salt Other Clinician: Referring Octavia Velador: Thersa Salt Treating Shannon Balthazar/Extender: Frann Rider in Treatment: 7 Vital Signs Height(in): 61 Pulse(bpm): 61 Weight(lbs): 140 Blood Pressure 180/80 (mmHg): Body Mass Index(BMI): 26 Temperature(F): 97.9 Respiratory Rate 16 (breaths/min): Photos: [N/A:N/A] Wound Location: Right Lower Leg - Lateral N/A N/A Wounding Event: Surgical Injury N/A N/A Primary Etiology: Open Surgical Wound N/A N/A Comorbid History: Congestive Heart Failure, N/A N/A Hypertension, Myocardial Infarction, Osteoarthritis,  Neuropathy Date Acquired: 08/24/2016 N/A N/A Weeks of Treatment: 7 N/A N/A Wound Status: Open N/A N/A Measurements L x W x D 0.5x0.3x0.2 N/A N/A (cm) Area (cm) : 0.118 N/A N/A Volume (cm) : 0.024 N/A N/A % Reduction in Area: 93.70% N/A N/A % Reduction in Volume: 96.80% N/A N/A Classification: Partial Thickness N/A N/A Exudate Amount: Small N/A N/A Exudate Type: Serous N/A N/A Exudate Color: amber N/A N/A Wound Margin: Distinct, outline attached N/A N/A Granulation Amount: Large (67-100%) N/A N/A Granulation Quality: Pale, Friable N/A N/A Necrotic Amount: Small (1-33%) N/A N/A Katherine [name], Katherine I. ([id number]) Exposed Structures: Fat Layer (Subcutaneous N/A N/A Tissue) Exposed: Yes Epithelialization: Medium (34-66%) N/A N/A Periwound Skin Texture: Excoriation: No N/A N/A Induration: No Callus: No Crepitus: No Rash: No Scarring: No Periwound Skin Maceration: No N/A N/A Moisture: Dry/Scaly: No Periwound Skin Color: Atrophie Blanche: No N/A N/A Cyanosis: No Ecchymosis: No Erythema: No Hemosiderin Staining: No Mottled: No Pallor: No Rubor: No Temperature: No Abnormality N/A N/A Tenderness on Yes N/A N/A Palpation: Wound Preparation: Ulcer Cleansing: N/A  N/A Rinsed/Irrigated with Saline, Other: surg scrub and water Topical Anesthetic Applied: Other: lidocaine 4% Treatment Notes Electronic Signature(s) Signed: 11/25/2016 4:21:13 PM By: Christin Fudge MD, FACS Entered By: Christin Fudge on 11/25/2016 16:21:13 [name] ([id number]) -------------------------------------------------------------------------------- [place] Details Patient Name: Katherine Juba I. Date of Service: 11/25/2016 3:45 PM Medical Record Number: [id number] Patient Account Number: [id number] Date of Birth/Sex: 05-27-1930 (83 y.o. Female) Treating RN: Cornell Barman Primary Care Corrin Sieling: Thersa Salt Other Clinician: Referring Deundra Furber: Thersa Salt Treating Malick Netz/Extender: Frann Rider in Treatment: 7 Active Inactive ` Abuse / Safety / Falls / Self Care Management Nursing Diagnoses: Potential for falls Goals: Patient will remain injury free Date Initiated: 10/07/2016 Target Resolution Date: 12/18/2016 Goal Status: Active Interventions: Assess fall risk on admission and as needed Assess impairment of mobility on admission and as needed per policy Notes: ` Nutrition Nursing Diagnoses: Imbalanced nutrition Goals: Patient/caregiver agrees to and verbalizes understanding of need to use nutritional supplements and/or vitamins as prescribed Date Initiated: 10/07/2016 Target Resolution Date: 12/18/2016 Goal Status: Active Interventions: Assess patient nutrition upon admission and as needed per policy Notes: ` Orientation to the Wound Care Program Nursing Diagnoses: Katherine [name], Katherine [name] ([id number]) Knowledge deficit related to the wound healing center program Goals: Patient/caregiver will verbalize understanding of the [facility] Date Initiated: 10/07/2016 Target Resolution Date: 10/23/2016 Goal Status: Active Interventions: Provide education on orientation to the wound center Notes: ` Pain, Acute or Chronic Nursing  Diagnoses: Pain, acute or chronic: actual or potential Potential alteration in comfort, pain Goals: Patient/caregiver will verbalize adequate pain control between visits Date Initiated: 10/07/2016 Target Resolution Date: 12/18/2016 Goal Status: Active Interventions: Assess comfort goal upon admission Complete pain assessment as per visit requirements Notes: ` Wound/Skin Impairment Nursing Diagnoses: Impaired tissue integrity Knowledge deficit related to smoking impact on wound healing Knowledge deficit related to ulceration/compromised skin integrity Goals: Ulcer/skin breakdown will have a volume reduction of 80% by week 12 Date Initiated: 10/07/2016 Target Resolution Date: 12/11/2016 Goal Status: Active Interventions: Assess patient/caregiver ability to perform ulcer/skin care regimen upon admission and as needed Assess ulceration(s) every visit Katherine [name], Katherine [name] ([id number]) Notes: Electronic Signature(s) Signed: 11/25/2016 5:02:27 PM By: Gretta Cool, RN, BSN, Kim RN, BSN Entered By: Gretta Cool, RN, BSN, Kim on 11/25/2016 [place], [place]. ([id number]) -------------------------------------------------------------------------------- Pain Assessment Details Patient Name: Katherine Juba I. Date of Service: 11/25/2016 3:45 PM Medical Record Number: [id number] Patient Account Number: [id number]  Date of Birth/Sex: 08-16-1930 (83 y.o. Female) Treating RN: Cornell Barman Primary Care Kelseigh Diver: Thersa Salt Other Clinician: Referring Kniyah Khun: Thersa Salt Treating Lilliann Rossetti/Extender: Frann Rider in Treatment: 7 Active Problems Location of Pain Severity and Description of Pain Patient Has Paino Yes Site Locations Pain Location: Generalized Pain With Dressing Change: Yes Duration of the Pain. Constant / Intermittento Constant Character of Pain Describe the Pain: Aching, Exhausting, Shooting Pain Management and Medication Current Pain Management: Goals for Pain Management Pain in  shoulder Notes Topical or injectable lidocaine is offered to patient for acute pain when surgical debridement is performed. If needed, Patient is instructed to use over the counter pain medication for the following 24-48 hours after debridement. Wound care MDs do not prescribed pain medications. Patient has chronic pain or uncontrolled pain. Patient has been instructed to make an appointment with their Primary Care Physician for pain management. Electronic Signature(s) Signed: 11/25/2016 5:02:27 PM By: Gretta Cool, RN, BSN, Kim RN, BSN Entered By: Gretta Cool, RN, BSN, Kim on 11/25/2016 15:48:51 [name] ([id number]) -------------------------------------------------------------------------------- Patient/Caregiver Education Details Patient Name: Katherine Juba I. Date of Service: 11/25/2016 3:45 PM Medical Record Number: [id number] Patient Account Number: [id number] Date of Birth/Gender: 02/24/1930 (83 y.o. Female) Treating RN: Cornell Barman Primary Care Physician: Thersa Salt Other Clinician: Referring Physician: Thersa Salt Treating Physician/Extender: Frann Rider in Treatment: 7 Education Assessment Education Provided To: Patient Education Topics Provided Wound/Skin Impairment: Handouts: Caring for Your Ulcer Methods: Demonstration, Explain/Verbal Responses: State content correctly Electronic Signature(s) Signed: 11/25/2016 5:02:27 PM By: Gretta Cool, RN, BSN, Kim RN, BSN Entered By: Gretta Cool, RN, BSN, Kim on 11/25/2016 16:31:23 Katherine [name] Kitchen ([id number]) -------------------------------------------------------------------------------- Wound Assessment Details Patient Name: Katherine Juba I. Date of Service: 11/25/2016 3:45 PM Medical Record Number: [id number] Patient Account Number: [id number] Date of Birth/Sex: Nov 24, 1929 (83 y.o. Female) Treating RN: Cornell Barman Primary Care Theon Sobotka: Thersa Salt Other Clinician: Referring Ita Fritzsche: Thersa Salt Treating Ashir Kunz/Extender: Frann Rider  in Treatment: 7 Wound Status Wound Number: 1 Primary Open Surgical Wound Etiology: Wound Location: Right Lower Leg - Lateral Wound Open Wounding Event: Surgical Injury Status: Date Acquired: 08/24/2016 Comorbid Congestive Heart Failure, Weeks Of Treatment: 7 History: Hypertension, Myocardial Infarction, Clustered Wound: No Osteoarthritis, Neuropathy Photos Wound Measurements Length: (cm) 0.5 Width: (cm) 0.3 Depth: (cm) 0.2 Area: (cm) 0.118 Volume: (cm) 0.024 % Reduction in Area: 93.7% % Reduction in Volume: 96.8% Epithelialization: Medium (34-66%) Tunneling: No Undermining: No Wound Description Classification: Partial Thickness Foul Odor Aft Wound Margin: Distinct, outline attached Slough/Fibrin Exudate Amount: Small Exudate Type: Serous Exudate Color: amber er Cleansing: No o Yes Wound Bed Granulation Amount: Large (67-100%) Exposed Structure Granulation Quality: Pale, Friable Fat Layer (Subcutaneous Tissue) Exposed: Yes Necrotic Amount: Small (1-33%) Necrotic Quality: Adherent Slough Periwound Skin Texture Texture Color [place], Nahjae I. ([id number]) No Abnormalities Noted: No No Abnormalities Noted: No Callus: No Atrophie Blanche: No Crepitus: No Cyanosis: No Excoriation: No Ecchymosis: No Induration: No Erythema: No Rash: No Hemosiderin Staining: No Scarring: No Mottled: No Pallor: No Moisture Rubor: No No Abnormalities Noted: No Dry / Scaly: No Temperature / Pain Maceration: No Temperature: No Abnormality Tenderness on Palpation: Yes Wound Preparation Ulcer Cleansing: Rinsed/Irrigated with Saline, Other: surg scrub and water, Topical Anesthetic Applied: Other: lidocaine 4%, Treatment Notes Wound #1 (Right, Lateral Lower Leg) 1. Cleansed with: Clean wound with Normal Saline Cleanse wound with antibacterial soap and water 2. Anesthetic Topical Lidocaine 4% cream to wound bed prior to debridement 4. Dressing Applied: Prisma Ag 5.  Secondary Dressing Applied ABD  Pad 7. Secured with 3 Layer Compression System - Right Lower Extremity Notes unna to Engineer, production) Signed: 11/25/2016 5:02:27 PM By: Gretta Cool, RN, BSN, Kim RN, BSN Entered By: Gretta Cool, RN, BSN, Kim on 11/25/2016 15:59:16 Katherine Juba I. ([id number]) -------------------------------------------------------------------------------- [place] Details Patient Name: Katherine Juba I. Date of Service: 11/25/2016 3:45 PM Medical Record Number: [id number] Patient Account Number: [id number] Date of Birth/Sex: [birth year] (83 y.o. Female) Treating RN: Cornell Barman Primary Care Velna Hedgecock: Thersa Salt Other Clinician: Referring Patric Buckhalter: Thersa Salt Treating Boyd Buffalo/Extender: Frann Rider in Treatment: 7 Vital Signs Time Taken: 15:49 Temperature (F): 97.9 Height (in): 61 Pulse (bpm): 61 Weight (lbs): 140 Respiratory Rate (breaths/min): 16 Body Mass Index (BMI): 26.4 Blood Pressure (mmHg): 180/80 Reference Range: 80 - 120 mg / dl Electronic Signature(s) Signed: 11/25/2016 5:02:27 PM By: Gretta Cool, RN, BSN, Kim RN, BSN Entered By: Gretta Cool, RN, BSN, Kim on 11/25/2016 15:49:40

## 2016-11-27 NOTE — Progress Notes (Signed)
JISELLA, ASHENFELTER ([id number]) Visit Report for 11/25/2016 Chief Complaint Document Details Patient Name: Katherine [name], Katherine I. Date of Service: 11/25/2016 3:45 PM Medical Record Number: [id number] Patient Account Number: [id number] Date of Birth/Sex: October 21, 1929 (83 y.o. Female) Treating RN: Baruch Gouty, RN, BSN, Velva Harman Primary Care Provider: Thersa Salt Other Clinician: Referring Provider: Thersa Salt Treating Provider/Extender: Frann Rider in Treatment: 7 Information Obtained from: Patient Chief Complaint Patient presents to the wound care center with open non-healing surgical wound(s) of the right ankle after a trimalleolar fracture was repaired ion 9th January 2018 Electronic Signature(s) Signed: 11/25/2016 4:21:20 PM By: Christin Fudge MD, FACS Entered By: Christin Fudge on 11/25/2016 16:21:20 Katherine Juba I. ([id number]) -------------------------------------------------------------------------------- HPI Details Patient Name: Katherine Juba I. Date of Service: 11/25/2016 3:45 PM Medical Record Number: [id number] Patient Account Number: [id number] Date of Birth/Sex: 1930-05-15 (83 y.o. Female) Treating RN: Baruch Gouty, RN, BSN, Velva Harman Primary Care Provider: Thersa Salt Other Clinician: Referring Provider: Thersa Salt Treating Provider/Extender: Frann Rider in Treatment: 7 History of Present Illness Location: right lateral ankle Quality: Patient reports experiencing a dull pain to affected area(s). Severity: Patient states wound (s) are getting better. Duration: Patient has had the wound for < 6 weeks prior to presenting for treatment Timing: Pain in wound is constant (hurts all the time) Context: The wound appeared gradually over time Modifying Factors: Other treatment(s) tried include:nonweightbearing boot Associated Signs and Symptoms: Patient reports having increase swelling. HPI Description: 83 year old patient who has recently been seen several times at the orthopedic office for problems  with her wound healing after she had a right ankle trimalleolar fracture repaired on 08/24/2016. He simply did notice some drainage from the incision and she was reviewed in the orthopedic office on 10/01/2016 and started on Keflex and Septra. She was sent to [place] for an opinion is also than be following up with the orthopedic physician. Most recent x-rays taken of the orthopedic office were within normal limits. The patient does not have any significant history of diabetes mellitus or smoking but has had edema problems in the legs and does say that she's seen a vein surgeon in the past. All this is mainly done in [place], [place] where she normally resides. 11/19/2016 -- she saw her orthopedic surgeon who has asked her to weight-bear without the orthopedic shoe and she has been walking around without much problems. 11/25/2016 -- she is going to be in town for another couple of weeks and we will continue with compression wraps. Her insurance company did not cover juxta lites. Electronic Signature(s) Signed: 11/25/2016 4:21:58 PM By: Christin Fudge MD, FACS Entered By: Christin Fudge on 11/25/2016 16:21:57 Katherine Juba IMarland Kitchen ([id number]) -------------------------------------------------------------------------------- Physical Exam Details Patient Name: Katherine Juba I. Date of Service: 11/25/2016 3:45 PM Medical Record Number: [id number] Patient Account Number: [id number] Date of Birth/Sex: [birth year] (83 y.o. Female) Treating RN: Baruch Gouty, RN, BSN, Velva Harman Primary Care Provider: Thersa Salt Other Clinician: Referring Provider: Thersa Salt Treating Provider/Extender: Frann Rider in Treatment: 7 Constitutional . Pulse regular. Respirations normal and unlabored. Afebrile. . Eyes Nonicteric. Reactive to light. Ears, Nose, Mouth, and Throat Lips, teeth, and gums WNL.Marland Kitchen Moist mucosa without lesions. Neck supple and nontender. No palpable supraclavicular or cervical adenopathy. Normal sized without  goiter. Respiratory WNL. No retractions.. Breath sounds WNL, No rubs, rales, rhonchi, or wheeze.. Cardiovascular Heart rhythm and rate regular, no murmur or gallop.. Pedal Pulses WNL. No clubbing, cyanosis or edema. Lymphatic No adneopathy. No adenopathy. No adenopathy. Musculoskeletal Adexa without tenderness or enlargement.. Digits and  nails w/o clubbing, cyanosis, infection, petechiae, ischemia, or inflammatory conditions.. Integumentary (Hair, Skin) No suspicious lesions. No crepitus or fluctuance. No peri-wound warmth or erythema. No masses.Marland Kitchen Psychiatric Judgement and insight Intact.. No evidence of depression, anxiety, or agitation.. Notes there are 2 small areas where there is some subcutaneous tissue to heal but other than that the lymphedema is looking much better controlled Electronic Signature(s) Signed: 11/25/2016 4:22:29 PM By: Christin Fudge MD, FACS Entered By: Christin Fudge on 11/25/2016 16:22:29 Katherine [name] ([id number]) -------------------------------------------------------------------------------- Physician Orders Details Patient Name: Katherine Juba I. Date of Service: 11/25/2016 3:45 PM Medical Record Number: [id number] Patient Account Number: [id number] Date of Birth/Sex: 20-Dec-1929 (83 y.o. Female) Treating RN: Cornell Barman Primary Care Provider: Thersa Salt Other Clinician: Referring Provider: Thersa Salt Treating Provider/Extender: Frann Rider in Treatment: 7 Verbal / Phone Orders: No Diagnosis Coding Wound Cleansing Wound #1 Right,Lateral Lower Leg o Cleanse wound with mild soap and water - in clinic Anesthetic Wound #1 Right,Lateral Lower Leg o Topical Lidocaine 4% cream applied to wound bed prior to debridement - for clinic use Skin Barriers/Peri-Wound Care Wound #1 Right,Lateral Lower Leg o Moisturizing lotion Primary Wound Dressing Wound #1 Right,Lateral Lower Leg o Prisma Ag Secondary Dressing Wound #1 Right,Lateral Lower Leg o  ABD pad Dressing Change Frequency Wound #1 Right,Lateral Lower Leg o Change dressing every week - pt to get changed at wound care center next week Follow-up Appointments Wound #1 Right,Lateral Lower Leg o Return Appointment in 1 week. Edema Control Wound #1 Right,Lateral Lower Leg o 3 Layer Compression System - Right Lower Extremity - unna to anchor o Elevate legs to the level of the heart and pump ankles as often as possible Additional Orders / Instructions Katherine [name], Katherine I. ([id number]) Wound #1 Right,Lateral Lower Leg o Increase protein intake. Home Health Wound #1 [place] Visits - [place] Nurse may visit PRN to address patientos wound care needs. o FACE TO FACE ENCOUNTER: MEDICARE and MEDICAID PATIENTS: I certify that this patient is under my care and that I had a face-to-face encounter that meets the physician face-to-face encounter requirements with this patient on this date. The encounter with the patient was in whole or in part for the following MEDICAL CONDITION: (primary reason for [place]) MEDICAL NECESSITY: I certify, that based on my findings, NURSING services are a medically necessary home health service. HOME BOUND STATUS: I certify that my clinical findings support that this patient is homebound (Maddene., Due to illness or injury, pt requires aid of supportive devices such as crutches, cane, wheelchairs, walkers, the use of special transportation or the assistance of another person to leave their place of residence. There is a normal inability to leave the home and doing so requires considerable and taxing effort. Other absences are for medical reasons / religious services and are infrequent or of short duration when for other reasons). o If current dressing causes regression in wound condition, may D/C ordered dressing product/s and apply Normal Saline Moist Dressing daily until next [place] / Other MD appointment. [place] of regression in wound condition at [phone]. o Please direct any NON-WOUND related issues/requests for orders to patient's Primary Care Physician Electronic Signature(s) Signed: 11/25/2016 4:32:02 PM By: Christin Fudge MD, FACS Signed: 11/25/2016 5:02:27 PM By: Gretta Cool RN, BSN, Kim RN, BSN Entered By: Gretta Cool, RN, BSN, Kim on 11/25/2016 16:08:45 Katherine [name] ([id number]) -------------------------------------------------------------------------------- Problem List Details Patient Name: Katherine Juba I.  Date of Service: 11/25/2016 3:45 PM Medical Record Number: [id number] Patient Account Number: [id number] Date of Birth/Sex: 12/17/1929 (84 y.o. 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 Date of Service: 11/25/2016 3:45 PM Medical Record Number: [id number] Patient Account Number: [id number] Date of Birth/Sex: Feb 14, 1930 (83 y.o. Female) Treating RN: Afful, RN, BSN, Velva Harman Primary Care Provider: Thersa Salt Other Clinician: Referring Provider: Thersa Salt Treating Provider/Extender: Frann Rider in Treatment: 7 Subjective Chief Complaint Information obtained from Patient Patient presents to the wound care center with open non-healing surgical wound(s) of the right ankle after a trimalleolar fracture was repaired ion 9th January 2018 History of Present Illness (HPI) The following HPI elements were documented for the patient's wound: Location: right lateral ankle Quality: Patient reports experiencing a dull pain to affected area(s). Severity: Patient states wound (s) are getting better. Duration: Patient has had the wound for < 6 weeks prior to presenting for treatment Timing: Pain in wound is constant (hurts all the time) Context: The wound appeared gradually over time Modifying Factors: Other treatment(s) tried include:nonweightbearing boot Associated Signs and Symptoms: Patient reports having increase swelling. 83 year old patient who has recently been seen several times at the orthopedic office for problems with her wound healing after she had a right ankle trimalleolar fracture repaired on 08/24/2016. He simply did notice some drainage from the incision and she was reviewed in the orthopedic office on 10/01/2016 and started on Keflex and Septra. She was sent to [place] for an opinion is also than be following up with the orthopedic physician. Most recent x-rays taken of the orthopedic office were within normal limits. The patient does not have any significant history of diabetes mellitus or smoking but has had edema problems in the legs and does say that she's seen a vein surgeon in the past. All this is mainly done in [place], [place] where she normally resides. 11/19/2016 -- she saw her orthopedic surgeon who has asked her to weight-bear without the orthopedic shoe and she has been walking around without much problems. 11/25/2016 -- she is going to be in town for another couple  of weeks and we will continue with compression wraps. Her insurance company did not cover juxta lites. Objective Constitutional Katherine [name], Katherine I. ([id number]) Pulse regular. Respirations normal and unlabored. Afebrile. Vitals Time Taken: 3:49 PM, Height: 61 in, Weight: 140 lbs, BMI: 26.4, Temperature: 97.9 F, Pulse: 61 bpm, Respiratory Rate: 16 breaths/min, Blood Pressure: 180/80 mmHg. Eyes Nonicteric. Reactive to light. Ears, Nose, Mouth, and Throat Lips, teeth, and gums WNL.Marland Kitchen Moist mucosa without lesions. Neck supple and nontender. No palpable supraclavicular or cervical adenopathy. Normal sized without goiter. Respiratory WNL. No retractions.. Breath sounds WNL, No rubs, rales, rhonchi, or wheeze.. Cardiovascular Heart rhythm and rate regular, no murmur or gallop.. Pedal Pulses WNL. No clubbing, cyanosis or edema. Lymphatic No adneopathy. No adenopathy. No  adenopathy. Musculoskeletal Adexa without tenderness or enlargement.. Digits and nails w/o clubbing, cyanosis, infection, petechiae, ischemia, or inflammatory conditions.Marland Kitchen Psychiatric Judgement and insight Intact.. No evidence of depression, anxiety, or agitation.. General Notes: there are 2 small areas where there is some subcutaneous tissue to heal but other than that the lymphedema is looking much better controlled Integumentary (Hair, Skin) No suspicious lesions. No crepitus or fluctuance. No peri-wound warmth or erythema. No masses.. Wound #1 status is Open. Original cause of wound was Surgical Injury. The wound is located on the Right,Lateral Lower Leg. The wound measures 0.5cm length x 0.3cm width x 0.2cm depth; 0.118cm^2 area and 0.024cm^3 volume. There is Fat Layer (Subcutaneous Tissue) Exposed exposed. There is no tunneling or undermining noted. There is a small amount of serous drainage noted. The wound margin is distinct with the outline attached to the wound base. There is large (67-100%) pale, friable granulation  within the wound bed. There is a small (1-33%) amount of necrotic tissue within the wound bed including Adherent Slough. The periwound skin appearance did not exhibit: Callus, Crepitus, Excoriation, Induration, Rash, Scarring, Dry/Scaly, Maceration, Atrophie Blanche, Cyanosis, Ecchymosis, Hemosiderin Staining, Mottled, Pallor, Rubor, Erythema. Periwound temperature was noted as No Abnormality. The periwound has tenderness on palpation. Katherine [name], Katherine I. 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 I89.0 - Lymphedema, not elsewhere classified M84.471G - Pathological fracture, right ankle, subsequent encounter for fracture with delayed healing Plan Wound Cleansing: Wound #1 Right,Lateral Lower Leg: Cleanse wound with mild soap and water - in clinic Anesthetic: Wound #1 Right,Lateral Lower Leg: Topical Lidocaine 4% cream applied to wound bed prior to debridement - for clinic use Skin Barriers/Peri-Wound Care: Wound #1 Right,Lateral Lower Leg: Moisturizing lotion Primary Wound Dressing: Wound #1 Right,Lateral Lower Leg: Prisma Ag Secondary Dressing: Wound #1 Right,Lateral Lower Leg: ABD pad Dressing Change Frequency: Wound #1 Right,Lateral Lower Leg: Change dressing every week - pt to get changed at wound care center next week Follow-up Appointments: Wound #1 Right,Lateral Lower Leg: Return Appointment in 1 week. Edema Control: Wound #1 Right,Lateral Lower Leg: 3 Layer Compression System - Right Lower Extremity - unna to anchor Elevate legs to the level of the heart and pump ankles as often as possible Additional Orders / Instructions: Wound #1 Right,Lateral Lower Leg: Increase protein intake. Home Health: Wound #1 Right,Lateral Lower Leg: Continue Home Health Visits - Katherine [name], Katherine I.  ([id number]) Home Health Nurse may visit PRN to address patient s wound care needs. FACE TO FACE ENCOUNTER: MEDICARE and MEDICAID PATIENTS: I certify that this patient is under my care and that I had a face-to-face encounter that meets the physician face-to-face encounter requirements with this patient on this date. The encounter with the patient was in whole or in part for the following MEDICAL CONDITION: (primary reason for [place]) MEDICAL NECESSITY: I certify, that based on my findings, NURSING services are a medically necessary home health service. HOME BOUND STATUS: I certify that my clinical findings support that this patient is homebound ([place]addene., Due to illness or injury, pt requires aid of supportive devices such as crutches, cane, wheelchairs, walkers, the use of special transportation or the assistance of another person to leave their place of residence. There is a normal inability to leave the home and doing so requires considerable and taxing effort. Other absences are for medical reasons / religious services and  are infrequent or of short duration when for other reasons). If current dressing causes regression in wound condition, may D/C ordered dressing product/s and apply Normal Saline Moist Dressing daily until next [place] / Other MD appointment. [place] of regression in wound condition at [phone]. Please direct any NON-WOUND related issues/requests for orders to patient's Primary Care Physician after review I have recommended: 1. Silver collagen to be covered with Drawtex and some gauze padding and wrapped with a Profore lite 2. Elevation has been recommended 3. Will order some juxta light stockings -- insurance company would not cover this 4. Phisical Therapy as per the orthopedic physician's orders 5. review next weeks after the orthopedic opinion Electronic Signature(s) Signed: 11/25/2016 4:23:37 PM By: Christin Fudge MD,  FACS Entered By: Christin Fudge on 11/25/2016 16:23:36 Katherine Juba I. ([id number]) -------------------------------------------------------------------------------- SuperBill Details Patient Name: Katherine Juba I. Date of Service: 11/25/2016 Medical Record Number: [id number] Patient Account Number: [id number] Date of Birth/Sex: 1929-11-01 (83 y.o. 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 I89.0 Lymphedema, not elsewhere classified M84.471G Pathological fracture, right ankle, subsequent encounter for fracture with delayed healing Facility Procedures CPT4: Description Modifier Quantity Code [id number] (Facility Use Only) [phone] - [facility] RT 1 LEG Physician Procedures CPT4: Description Modifier Quantity Code [id number] - WC PHYS LEVEL 3 - EST PT 1 ICD-10 Description Diagnosis T81.31XA Disruption of external operation (surgical) wound, not elsewhere classified, initial encounter L97.311 Non-pressure chronic ulcer of  right ankle limited to breakdown of skin I89.0 Lymphedema, not elsewhere classified M84.471G Pathological fracture, right ankle, subsequent encounter for fracture with delayed healing Electronic Signature(s) Signed: 11/25/2016 4:32:02 PM By: Christin Fudge MD, FACS Signed: 11/25/2016 5:02:27 PM By: Gretta Cool RN, BSN, Kim RN, BSN Previous Signature: 11/25/2016 4:23:50 PM Version By: Christin Fudge MD, FACS Entered By: Gretta Cool, RN, BSN, Kim on 11/25/2016 16:30:12

## 2016-11-29 DIAGNOSIS — I5042 Chronic combined systolic (congestive) and diastolic (congestive) heart failure: Secondary | ICD-10-CM | POA: Diagnosis not present

## 2016-11-29 DIAGNOSIS — I110 Hypertensive heart disease with heart failure: Secondary | ICD-10-CM | POA: Diagnosis not present

## 2016-11-29 DIAGNOSIS — I2510 Atherosclerotic heart disease of native coronary artery without angina pectoris: Secondary | ICD-10-CM | POA: Diagnosis not present

## 2016-11-29 DIAGNOSIS — J449 Chronic obstructive pulmonary disease, unspecified: Secondary | ICD-10-CM | POA: Diagnosis not present

## 2016-11-29 DIAGNOSIS — E785 Hyperlipidemia, unspecified: Secondary | ICD-10-CM | POA: Diagnosis not present

## 2016-11-29 DIAGNOSIS — I255 Ischemic cardiomyopathy: Secondary | ICD-10-CM | POA: Diagnosis not present

## 2016-11-29 DIAGNOSIS — M25511 Pain in right shoulder: Secondary | ICD-10-CM | POA: Diagnosis not present

## 2016-11-29 DIAGNOSIS — R69 Illness, unspecified: Secondary | ICD-10-CM | POA: Diagnosis not present

## 2016-11-29 DIAGNOSIS — M1990 Unspecified osteoarthritis, unspecified site: Secondary | ICD-10-CM | POA: Diagnosis not present

## 2016-11-29 DIAGNOSIS — S82851D Displaced trimalleolar fracture of right lower leg, subsequent encounter for closed fracture with routine healing: Secondary | ICD-10-CM | POA: Diagnosis not present

## 2016-11-30 ENCOUNTER — Telehealth: Payer: Self-pay | Admitting: Family Medicine

## 2016-11-30 DIAGNOSIS — R69 Illness, unspecified: Secondary | ICD-10-CM | POA: Diagnosis not present

## 2016-11-30 DIAGNOSIS — I255 Ischemic cardiomyopathy: Secondary | ICD-10-CM | POA: Diagnosis not present

## 2016-11-30 DIAGNOSIS — I5042 Chronic combined systolic (congestive) and diastolic (congestive) heart failure: Secondary | ICD-10-CM | POA: Diagnosis not present

## 2016-11-30 DIAGNOSIS — I110 Hypertensive heart disease with heart failure: Secondary | ICD-10-CM | POA: Diagnosis not present

## 2016-11-30 DIAGNOSIS — M25511 Pain in right shoulder: Secondary | ICD-10-CM | POA: Diagnosis not present

## 2016-11-30 DIAGNOSIS — M1990 Unspecified osteoarthritis, unspecified site: Secondary | ICD-10-CM | POA: Diagnosis not present

## 2016-11-30 DIAGNOSIS — E785 Hyperlipidemia, unspecified: Secondary | ICD-10-CM | POA: Diagnosis not present

## 2016-11-30 DIAGNOSIS — J449 Chronic obstructive pulmonary disease, unspecified: Secondary | ICD-10-CM | POA: Diagnosis not present

## 2016-11-30 DIAGNOSIS — I2510 Atherosclerotic heart disease of native coronary artery without angina pectoris: Secondary | ICD-10-CM | POA: Diagnosis not present

## 2016-11-30 DIAGNOSIS — S82851D Displaced trimalleolar fracture of right lower leg, subsequent encounter for closed fracture with routine healing: Secondary | ICD-10-CM | POA: Diagnosis not present

## 2016-11-30 NOTE — Telephone Encounter (Signed)
I can see her or cardiology can.

## 2016-11-30 NOTE — Telephone Encounter (Signed)
Katherine [name] was called and told that pt could see either her PCP or cardiology. She stated she call pt and daughter to see what they choose.

## 2016-11-30 NOTE — Telephone Encounter (Signed)
Do you want her to be seen?

## 2016-11-30 NOTE — Telephone Encounter (Signed)
Bailey from [facility] called and stated that the pt is having fluttering in her chest and has 2 nitro patches, bp was elevated over the weekend, and felt bad over the weekend. Pt is felling better today with her bp of 152/76 pulse 53. Please advise, thank you!  Bailey @ [phone]

## 2016-12-02 ENCOUNTER — Telehealth: Payer: Self-pay | Admitting: Family Medicine

## 2016-12-02 DIAGNOSIS — I2510 Atherosclerotic heart disease of native coronary artery without angina pectoris: Secondary | ICD-10-CM | POA: Diagnosis not present

## 2016-12-02 DIAGNOSIS — J449 Chronic obstructive pulmonary disease, unspecified: Secondary | ICD-10-CM | POA: Diagnosis not present

## 2016-12-02 DIAGNOSIS — M25511 Pain in right shoulder: Secondary | ICD-10-CM | POA: Diagnosis not present

## 2016-12-02 DIAGNOSIS — I110 Hypertensive heart disease with heart failure: Secondary | ICD-10-CM | POA: Diagnosis not present

## 2016-12-02 DIAGNOSIS — E785 Hyperlipidemia, unspecified: Secondary | ICD-10-CM | POA: Diagnosis not present

## 2016-12-02 DIAGNOSIS — R69 Illness, unspecified: Secondary | ICD-10-CM | POA: Diagnosis not present

## 2016-12-02 DIAGNOSIS — I5042 Chronic combined systolic (congestive) and diastolic (congestive) heart failure: Secondary | ICD-10-CM | POA: Diagnosis not present

## 2016-12-02 DIAGNOSIS — I255 Ischemic cardiomyopathy: Secondary | ICD-10-CM | POA: Diagnosis not present

## 2016-12-02 DIAGNOSIS — S82851D Displaced trimalleolar fracture of right lower leg, subsequent encounter for closed fracture with routine healing: Secondary | ICD-10-CM | POA: Diagnosis not present

## 2016-12-02 DIAGNOSIS — M1990 Unspecified osteoarthritis, unspecified site: Secondary | ICD-10-CM | POA: Diagnosis not present

## 2016-12-02 NOTE — Telephone Encounter (Signed)
Pt called wanting to know if she can get a Rx for [place] digestive health. Pt would like two refills.  Pharmacy is [id number] - [place], [address]  Call pt @ [phone]. Thank you!

## 2016-12-03 ENCOUNTER — Other Ambulatory Visit: Payer: Self-pay | Admitting: Family Medicine

## 2016-12-03 ENCOUNTER — Encounter: Payer: Medicare HMO | Admitting: Nurse Practitioner

## 2016-12-03 DIAGNOSIS — I509 Heart failure, unspecified: Secondary | ICD-10-CM | POA: Diagnosis not present

## 2016-12-03 DIAGNOSIS — L97311 Non-pressure chronic ulcer of right ankle limited to breakdown of skin: Secondary | ICD-10-CM | POA: Diagnosis not present

## 2016-12-03 DIAGNOSIS — I890 Lymphedema, not elsewhere classified: Secondary | ICD-10-CM | POA: Diagnosis not present

## 2016-12-03 DIAGNOSIS — I252 Old myocardial infarction: Secondary | ICD-10-CM | POA: Diagnosis not present

## 2016-12-03 DIAGNOSIS — T8131XA Disruption of external operation (surgical) wound, not elsewhere classified, initial encounter: Secondary | ICD-10-CM | POA: Diagnosis not present

## 2016-12-03 DIAGNOSIS — M1990 Unspecified osteoarthritis, unspecified site: Secondary | ICD-10-CM | POA: Diagnosis not present

## 2016-12-03 DIAGNOSIS — M84471G Pathological fracture, right ankle, subsequent encounter for fracture with delayed healing: Secondary | ICD-10-CM | POA: Diagnosis not present

## 2016-12-03 DIAGNOSIS — L97312 Non-pressure chronic ulcer of right ankle with fat layer exposed: Secondary | ICD-10-CM | POA: Diagnosis not present

## 2016-12-03 DIAGNOSIS — Y839 Surgical procedure, unspecified as the cause of abnormal reaction of the patient, or of later complication, without mention of misadventure at the time of the procedure: Secondary | ICD-10-CM | POA: Diagnosis not present

## 2016-12-03 DIAGNOSIS — I110 Hypertensive heart disease with heart failure: Secondary | ICD-10-CM | POA: Diagnosis not present

## 2016-12-03 DIAGNOSIS — G629 Polyneuropathy, unspecified: Secondary | ICD-10-CM | POA: Diagnosis not present

## 2016-12-03 MED ORDER — CULTURELLE DIGESTIVE HEALTH PO CAPS: ORAL_CAPSULE | ORAL | 3 refills | Status: DC

## 2016-12-03 NOTE — Telephone Encounter (Signed)
Done

## 2016-12-05 NOTE — Progress Notes (Signed)
Katherine [name] ([id number]) Visit Report for 12/03/2016 Chief Complaint Document Details Patient Name: Katherine [name], Katherine [name]. Date of Service: 12/03/2016 2:45 PM Medical Record Number: [id number] Patient Account Number: [id number] Date of Birth/Sex: [birth year] (83 y.o. Female) Treating RN: Baruch Gouty, RN, BSN, Velva Harman Primary Care Provider: Thersa Salt Other Clinician: Referring Provider: Thersa Salt Treating Provider/Extender: Cathie Olden in Treatment: 8 Information Obtained from: Patient Chief Complaint She arrives for follow up evaluation of her right lateral leg ulcers Electronic Signature(s) Signed: 12/03/2016 3:29:28 PM By: Lawanda Cousins Entered By: Lawanda Cousins on 12/03/2016 15:29:27 Katherine [name] ([id number]) -------------------------------------------------------------------------------- HPI Details Patient Name: Katherine [name]. Date of Service: 12/03/2016 2:45 PM Medical Record Number: [id number] Patient Account Number: [id number] Date of Birth/Sex: [birth year] (83 y.o. Female) Treating RN: Baruch Gouty, RN, BSN, Velva Harman Primary Care Provider: Thersa Salt Other Clinician: Referring Provider: Thersa Salt Treating Provider/Extender: Cathie Olden in Treatment: 8 History of Present Illness Location: right lateral ankle Quality: Patient reports experiencing a dull pain to affected area(s). Severity: Patient states wound (s) are getting better. Duration: Patient has had the wound for < 6 weeks prior to presenting for treatment Timing: Pain in wound is constant (hurts all the time) Context: The wound appeared gradually over time Modifying Factors: Other treatment(s) tried include:nonweightbearing boot Associated Signs and Symptoms: Patient reports having increase swelling. HPI Description: 83 year old patient who has recently been seen several times at the orthopedic office for problems with her wound healing after she had a right ankle trimalleolar fracture repaired on 08/24/2016. He simply  did notice some drainage from the incision and she was reviewed in the orthopedic office on 10/01/2016 and started on Keflex and Septra. She was sent to [place] for an opinion is also than be following up with the orthopedic physician. Most recent x-rays taken of the orthopedic office were within normal limits. The patient does not have any significant history of diabetes mellitus or smoking but has had edema problems in the legs and does say that she's seen a vein surgeon in the past. All this is mainly done in [place], [place] where she normally resides. 11/19/2016 -- she saw her orthopedic surgeon who has asked her to weight-bear without the orthopedic shoe and she has been walking around without much problems. 11/25/2016 -- she is going to be in town for another couple of weeks and we will continue with compression wraps. Her insurance company did not cover juxta lites. 12/03/16- she arrives for follow up evaluation of her right lateral leg ulcers. She has a follow up appointment with orthopedics on Monday and will have an x-ray at that time. She has been full weight bearing with no brace x2 weeks. She is voicing no complaints or concerns Electronic Signature(s) Signed: 12/03/2016 3:31:29 PM By: Lawanda Cousins Entered By: Lawanda Cousins on 12/03/2016 15:31:28 Katherine [name] Kitchen ([id number]) -------------------------------------------------------------------------------- Physical Exam Details Patient Name: Katherine [name]. Date of Service: 12/03/2016 2:45 PM Medical Record Number: [id number] Patient Account Number: [id number] Date of Birth/Sex: 06-20-1930 (83 y.o. Female) Treating RN: Afful, RN, BSN, [employer] Primary Care Provider: Thersa Salt Other Clinician: Referring Provider: Thersa Salt Treating Provider/Extender: Cathie Olden in Treatment: 8 Electronic Signature(s) Signed: 12/03/2016 3:31:41 PM By: Lawanda Cousins Entered By: Lawanda Cousins on 12/03/2016 15:31:40 Katherine [name]  ([id number]) -------------------------------------------------------------------------------- Physician Orders Details Patient Name: Katherine [name]. Date of Service: 12/03/2016 2:45 PM Medical Record Number: [id number] Patient Account Number: [id number] Date of Birth/Sex: 1930/07/12 (83 y.o. Female) Treating RN: Afful, RN, BSN, [facility]  Provider: Thersa Salt Other Clinician: Referring Provider: Thersa Salt Treating Provider/Extender: Cathie Olden in Treatment: 8 Verbal / Phone Orders: No Diagnosis Coding Wound Cleansing Wound #1 Right,Lateral Lower Leg o Cleanse wound with mild soap and water - in clinic Skin Barriers/Peri-Wound Care Wound #1 Right,Lateral Lower Leg o Moisturizing lotion Primary Wound Dressing Wound #1 Right,Lateral Lower Leg o Prisma Ag Secondary Dressing Wound #1 Right,Lateral Lower Leg o Non-adherent pad - [place] Dressing Change Frequency Wound #1 Right,Lateral Lower Leg o Change dressing every other day. Follow-up Appointments Wound #1 Right,Lateral Lower Leg o Return Appointment in 1 week. Edema Control Wound #1 Right,Lateral Lower Leg o Patient to wear own compression stockings o Elevate legs to the level of the heart and pump ankles as often as possible Additional Orders / Instructions Wound #1 Right,Lateral Lower Leg o Increase protein intake. Katherine [name] ([id number]) Wound #1 Right,Lateral Lower Leg o [place] Visits - [place] Nurse may visit PRN to address patientos wound care needs. o FACE TO FACE ENCOUNTER: MEDICARE and MEDICAID PATIENTS: [name] that this patient is under my care and that [name] had a face-to-face encounter that meets the physician face-to-face encounter requirements with this patient on this date. The encounter with the patient was in whole or in part for the following MEDICAL CONDITION: (primary reason for [name]) MEDICAL NECESSITY: [name]  certify, that based on my findings, NURSING services are a medically necessary home health service. HOME BOUND STATUS: [name] that my clinical findings support that this patient is homebound ([name].e., Due to illness or injury, pt requires aid of supportive devices such as crutches, cane, wheelchairs, walkers, the use of special transportation or the assistance of another person to leave their place of residence. There is a normal inability to leave the home and doing so requires considerable and taxing effort. Other absences are for medical reasons / religious services and are infrequent or of short duration when for other reasons). o If current dressing causes regression in wound condition, may D/C ordered dressing product/s and apply Normal Saline Moist Dressing daily until next [place] / Other MD appointment. [place] of regression in wound condition at [phone]. o Please direct any NON-WOUND related issues/requests for orders to patient's Primary Care Physician Electronic Signature(s) Signed: 12/03/2016 4:17:26 PM By: Regan Lemming BSN, RN Signed: 12/03/2016 4:20:28 PM By: Lawanda Cousins Entered By: Regan Lemming on 12/03/2016 15:13:00 Katherine [name] Kitchen ([id number]) -------------------------------------------------------------------------------- Problem List Details Patient Name: Katherine [name]. Date of Service: 12/03/2016 2:45 PM Medical Record Number: [id number] Patient Account Number: [id number] Date of Birth/Sex: [birth year] (83 y.o. 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 ([id number]) -------------------------------------------------------------------------------- Progress Note Details Patient Name: Katherine [name]. Date of Service: 12/03/2016 2:45 PM Medical Record Number: [id number] Patient Account Number: [id number] Date of Birth/Sex: [birth year] (83 y.o. Female) Treating RN: Afful, RN, BSN,  Velva Harman Primary Care Provider: Thersa Salt Other Clinician: Referring Provider: Thersa Salt Treating Provider/Extender: Cathie Olden in Treatment: 8 Subjective Chief Complaint Information obtained from Patient She arrives for follow up evaluation of her right lateral leg ulcers History of Present Illness (HPI) The following HPI elements were documented for the patient's wound: Location: right lateral ankle Quality: Patient reports experiencing a dull pain to affected area(s). Severity: Patient states wound (s) are getting better. Duration: Patient has had the wound for < 6 weeks prior to presenting for treatment Timing: Pain in wound is constant (hurts all the time) Context: The wound appeared gradually over time Modifying Factors: Other treatment(s) tried include:nonweightbearing boot Associated Signs and Symptoms: Patient reports having increase swelling. 83 year old patient who has recently been seen several times at the orthopedic office for problems with her wound healing after she had a right ankle trimalleolar fracture repaired on 08/24/2016. He simply did notice some drainage from the incision and she was reviewed in the orthopedic office on 10/01/2016 and started on  Keflex and Septra. She was sent to [place] for an opinion is also than be following up with the orthopedic physician. Most recent x-rays taken of the orthopedic office were within normal limits. The patient does not have any significant history of diabetes mellitus or smoking but has had edema problems in the legs and does say that she's seen a vein surgeon in the past. All this is mainly done in [place], [place] where she normally resides. 11/19/2016 -- she saw her orthopedic surgeon who has asked her to weight-bear without the orthopedic shoe and she has been walking around without much problems. 11/25/2016 -- she is going to be in town for another couple of weeks and we will continue with compression wraps. Her insurance company did not cover juxta lites. 12/03/16- she arrives for follow up evaluation of her right lateral leg ulcers. She has a follow up appointment with orthopedics on Monday and will have an x-ray at that time. She has been full weight bearing with no brace x2 weeks. She is voicing no complaints or concerns Katherine [name], Katherine [name]. ([id number]) Objective Constitutional Vitals Time Taken: 2:49 PM, Height: 61 in, Weight: 140 lbs, BMI: 26.4, Temperature: 98.2 F, Pulse: 52 bpm, Respiratory Rate: 16 breaths/min, Blood Pressure: 165/63 mmHg. Integumentary (Hair, Skin) Wound #1 status is Open. Original cause of wound was Surgical Injury. The wound is located on the Right,Lateral Lower Leg. The wound measures 0.1cm length x 0.1cm width x 0.1cm depth; 0.008cm^2 area and 0.001cm^3 volume. There is Fat Layer (Subcutaneous Tissue) Exposed exposed. There is no tunneling or undermining noted. There is a small amount of serous drainage noted. The wound margin is distinct with the outline attached to the wound base. There is no granulation within the wound bed. There is a large (67- 100%) amount of necrotic tissue within the wound bed including Adherent Slough. The periwound skin appearance exhibited:  Dry/Scaly. The periwound skin appearance did not exhibit: Callus, Crepitus, Excoriation, Induration, Rash, Scarring, Maceration, Atrophie Blanche, Cyanosis, Ecchymosis, Hemosiderin Staining, Mottled, Pallor, Rubor, Erythema. Periwound temperature was noted as No Abnormality. The periwound has tenderness on palpation. 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 ([id number]) Secondary Dressing: Wound #1 Right,Lateral Lower Leg: Non-adherent pad - [place] Dressing Change Frequency: Wound #1 Right,Lateral Lower Leg: Change dressing every other day. Follow-up Appointments: Wound #1 Right,Lateral Lower Leg: Return Appointment in 1 week. Edema Control: Wound #1 Right,Lateral Lower Leg: Patient to wear own compression stockings Elevate legs to the level of the heart and pump ankles as often as possible Additional Orders / Instructions: Wound #1 Right,Lateral Lower Leg: Increase protein intake. Home Health: Wound #1 Right,Lateral Lower Leg: Continue Home Health Visits - [place] Nurse may visit PRN to address patient s wound care needs. FACE TO FACE ENCOUNTER: MEDICARE and MEDICAID PATIENTS: [name] that this patient is under my care and that [name] had a face-to-face encounter that meets the physician face-to-face encounter requirements with this patient on this date. The  encounter with the patient was in whole or in part for the following MEDICAL CONDITION: (primary reason for [place]) MEDICAL NECESSITY: [name], that based on my findings, NURSING services are a medically necessary home health service. HOME BOUND STATUS: [name] that my clinical findings support that this patient is homebound ([name].e., Due to illness or injury, pt requires aid of supportive devices such as crutches, cane, wheelchairs, walkers, the use of special transportation or the assistance of another person to leave their place of residence. There is a normal inability to leave the home and doing so requires considerable and taxing effort. Other absences are for medical reasons / religious services and are infrequent or of short duration when for other reasons). If current dressing causes regression in wound condition, may D/C ordered dressing product/s and apply Normal Saline Moist Dressing daily until next [place] / Other MD appointment. [place] of regression in wound condition at [phone]. Please direct any NON-WOUND related issues/requests for orders to patient's Primary Care Physician 1. continue with silver collagen, cover dressing, personal compression stockings/wraps 2. follow up next week Electronic Signature(s) Signed: 12/03/2016 3:32:38 PM By: Lawanda Cousins Entered By: Lawanda Cousins on 12/03/2016 15:32:37 Katherine [name]. ([id number]) [place], Katherine [name]. ([id number]) -------------------------------------------------------------------------------- [place] Details Patient Name: Katherine [name]. Date of Service: 12/03/2016 Medical Record Number: [id number] Patient Account Number: [id number] Date of Birth/Sex: 11-17-1929 (83 y.o. 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 I89.0 Lymphedema, not elsewhere classified M84.471G Pathological fracture, right ankle, subsequent encounter for fracture with delayed healing Facility Procedures CPT4 Code: [id number] Description: 72620 - WOUND CARE VISIT-LEV 2 EST PT Modifier: Quantity: 1 Physician Procedures CPT4: Description Modifier Quantity Code [id number] 99213 - WC PHYS LEVEL 3 - EST PT 1 ICD-10 Description Diagnosis T81.31XA Disruption of external operation (surgical) wound, not elsewhere classified, initial encounter L97.311 Non-pressure chronic ulcer of  right ankle limited to breakdown of skin Electronic Signature(s) Signed: 12/03/2016 3:32:51 PM By: Lawanda Cousins Entered By: Lawanda Cousins on 12/03/2016 15:32:51

## 2016-12-05 NOTE — Progress Notes (Signed)
Katherine [name] ([id number]) Visit Report for 12/03/2016 Arrival Information Details Patient Name: Katherine [name], Katherine [name]. Date of Service: 12/03/2016 2:45 PM Medical Record Number: [id number] Patient Account Number: [id number] Date of Birth/Sex: 1930/07/09 (83 y.o. Female) Treating RN: Afful, RN, BSN, Velva Harman Primary Care Marybella Ethier: Thersa Salt Other Clinician: Referring Elward Nocera: Thersa Salt Treating Neela Zecca/Extender: Cathie Olden in Treatment: 8 Visit Information History Since Last Visit All ordered tests and consults were completed: No Patient Arrived: Wheel Chair Added or deleted any medications: No Arrival Time: 14:47 Any new allergies or adverse reactions: No Accompanied By: niece Had a fall or experienced change in No activities of daily living that may affect Transfer Assistance: None risk of falls: Patient Identification Verified: Yes Signs or symptoms of abuse/neglect since last No Secondary Verification Process Yes visito Completed: Has Dressing in Place as Prescribed: Yes Patient Requires Transmission-Based No Has Compression in Place as Prescribed: Yes Precautions: Pain Present Now: No Patient Has Alerts: No Electronic Signature(s) Signed: 12/03/2016 4:17:26 PM By: Regan Lemming BSN, RN Entered By: Regan Lemming on 12/03/2016 14:49:16 Katherine Juba IMarland Kitchen ([id number]) -------------------------------------------------------------------------------- Clinic Level of Care Assessment Details Patient Name: Katherine [name]. Date of Service: 12/03/2016 2:45 PM Medical Record Number: [id number] Patient Account Number: [id number] Date of Birth/Sex: Feb 03, 1930 (83 y.o. Female) Treating RN: Afful, RN, BSN, [place] Primary Care Zaliyah Meikle: Thersa Salt Other Clinician: Referring Jedediah Noda: Thersa Salt Treating Shamecca Whitebread/Extender: Cathie Olden in Treatment: 8 Clinic Level of Care Assessment Items TOOL 4 Quantity Score []  - Use when only an EandM is performed on FOLLOW-UP visit 0 ASSESSMENTS -  Nursing Assessment / Reassessment X - Reassessment of Co-morbidities (includes updates in patient status) 1 10 X - Reassessment of Adherence to Treatment Plan 1 5 ASSESSMENTS - Wound and Skin Assessment / Reassessment X - Simple Wound Assessment / Reassessment - one wound 1 5 []  - Complex Wound Assessment / Reassessment - multiple wounds 0 []  - Dermatologic / Skin Assessment (not related to wound area) 0 ASSESSMENTS - Focused Assessment []  - Circumferential Edema Measurements - multi extremities 0 []  - Nutritional Assessment / Counseling / Intervention 0 X - Lower Extremity Assessment (monofilament, tuning fork, pulses) 1 5 []  - Peripheral Arterial Disease Assessment (using hand held doppler) 0 ASSESSMENTS - Ostomy and/or Continence Assessment and Care []  - Incontinence Assessment and Management 0 []  - Ostomy Care Assessment and Management (repouching, etc.) 0 PROCESS - Coordination of Care X - Simple Patient / Family Education for ongoing care 1 15 []  - Complex (extensive) Patient / Family Education for ongoing care 0 []  - Staff obtains Programmer, systems, Records, Test Results / Process Orders 0 []  - Staff telephones HHA, Nursing Homes / Clarify orders / etc 0 []  - Routine Transfer to another Facility (non-emergent condition) 0 KHILEY, LIESER [name]. ([id number]) []  - Routine Hospital Admission (non-emergent condition) 0 []  - New Admissions / Biomedical engineer / Ordering NPWT, Apligraf, etc. 0 []  - Emergency Hospital Admission (emergent condition) 0 []  - Simple Discharge Coordination 0 []  - Complex (extensive) Discharge Coordination 0 PROCESS - Special Needs []  - Pediatric / Minor Patient Management 0 []  - Isolation Patient Management 0 []  - Hearing / Language / Visual special needs 0 []  - Assessment of Community assistance (transportation, D/C planning, etc.) 0 []  - Additional assistance / Altered mentation 0 []  - Support Surface(s) Assessment (bed, cushion, seat, etc.) 0 INTERVENTIONS -  Wound Cleansing / Measurement X - Simple Wound Cleansing - one wound 1 5 []  - Complex Wound Cleansing - multiple  wounds 0 X - Wound Imaging (photographs - any number of wounds) 1 5 []  - Wound Tracing (instead of photographs) 0 X - Simple Wound Measurement - one wound 1 5 []  - Complex Wound Measurement - multiple wounds 0 INTERVENTIONS - Wound Dressings X - Small Wound Dressing one or multiple wounds 1 10 []  - Medium Wound Dressing one or multiple wounds 0 []  - Large Wound Dressing one or multiple wounds 0 []  - Application of Medications - topical 0 []  - Application of Medications - injection 0 INTERVENTIONS - Miscellaneous []  - External ear exam 0 WYNONA, DUHAMEL [name]. ([id number]) []  - Specimen Collection (cultures, biopsies, blood, body fluids, etc.) 0 []  - Specimen(s) / Culture(s) sent or taken to Lab for analysis 0 []  - Patient Transfer (multiple staff / Harrel Lemon Lift / Similar devices) 0 []  - Simple Staple / Suture removal (25 or less) 0 []  - Complex Staple / Suture removal (26 or more) 0 []  - Hypo / Hyperglycemic Management (close monitor of Blood Glucose) 0 []  - Ankle / Brachial Index (ABI) - do not check if billed separately 0 X - Vital Signs 1 5 Has the patient been seen at the hospital within the last three years: Yes Total Score: 70 Level Of Care: New/Established - Level 2 Electronic Signature(s) Signed: 12/03/2016 4:17:26 PM By: Regan Lemming BSN, RN Entered By: Regan Lemming on 12/03/2016 15:14:49 [name] ([id number]) -------------------------------------------------------------------------------- Encounter Discharge Information Details Patient Name: Katherine [name]. Date of Service: 12/03/2016 2:45 PM Medical Record Number: [id number] Patient Account Number: [id number] Date of Birth/Sex: [birth year] (83 y.o. Female) Treating RN: Baruch Gouty, RN, BSN, Velva Harman Primary Care Jesper Stirewalt: Thersa Salt Other Clinician: Referring Valeda Corzine: Thersa Salt Treating Heru Montz/Extender: Cathie Olden in Treatment: 8 Encounter Discharge Information Items Discharge Pain Level: 0 Discharge Condition: Stable Ambulatory Status: Wheelchair Discharge Destination: Home Transportation: Private Auto Accompanied By: niece Schedule Follow-up Appointment: No Medication Reconciliation completed and provided to Patient/Care No Taylor Spilde: Provided on Clinical Summary of Care: 12/03/2016 Form Type Recipient Paper Patient DK Electronic Signature(s) Signed: 12/03/2016 3:20:59 PM By: Ruthine Dose Entered By: Ruthine Dose on 12/03/2016 15:20:59 Katherine Juba IMarland Kitchen ([id number]) -------------------------------------------------------------------------------- Lower Extremity Assessment Details Patient Name: Katherine [name]. Date of Service: 12/03/2016 2:45 PM Medical Record Number: [id number] Patient Account Number: [id number] Date of Birth/Sex: 1930/01/21 (83 y.o. Female) Treating RN: Afful, RN, BSN, Velva Harman Primary Care Shardae Kleinman: Thersa Salt Other Clinician: Referring Jennyfer Nickolson: Thersa Salt Treating Mackinsey Pelland/Extender: Cathie Olden in Treatment: 8 Edema Assessment Assessed: [Left: No] [Right: No] E[Left: dema] [Right: :] Calf Left: Right: Point of Measurement: 32 cm From Medial Instep cm 32.4 cm Ankle Left: Right: Point of Measurement: 7 cm From Medial Instep cm 20 cm Vascular Assessment Claudication: Claudication Assessment [Right:None] Pulses: Dorsalis Pedis Palpable: [Right:Yes] Posterior Tibial Extremity colors, hair growth, and conditions: Extremity Color: [Right:Normal] Hair Growth on Extremity: [Right:Yes] Temperature of Extremity: [Right:Warm] Toe Nail Assessment Left: Right: Thick: Yes Discolored: Yes Deformed: No Improper Length and Hygiene: No Electronic Signature(s) Signed: 12/03/2016 4:17:26 PM By: Regan Lemming BSN, RN Entered By: Regan Lemming on 12/03/2016 14:59:11 Katherine [name]. ([id number]) Edison Pace, Tamela Oddi [name].  ([id number]) -------------------------------------------------------------------------------- Multi Wound Chart Details Patient Name: Katherine [name]. Date of Service: 12/03/2016 2:45 PM Medical Record Number: [id number] Patient Account Number: [id number] Date of Birth/Sex: [birth year] (83 y.o. Female) Treating RN: Baruch Gouty, RN, BSN, Velva Harman Primary Care Eliane Hammersmith: Thersa Salt Other Clinician: Referring Parish Dubose: Thersa Salt Treating Lakia Gritton/Extender: Cathie Olden in Treatment: 8 Vital Signs Height(in): 61 Pulse(bpm): 63  Weight(lbs): 140 Blood Pressure 165/63 (mmHg): Body Mass Index(BMI): 26 Temperature(F): 98.2 Respiratory Rate 16 (breaths/min): Photos: [1:No Photos] [N/A:N/A] Wound Location: [1:Right Lower Leg - Lateral] [N/A:N/A] Wounding Event: [1:Surgical Injury] [N/A:N/A] Primary Etiology: [1:Open Surgical Wound] [N/A:N/A] Comorbid History: [1:Congestive Heart Failure, Hypertension, Myocardial Infarction, Osteoarthritis, Neuropathy] [N/A:N/A] Date Acquired: [1:08/24/2016] [N/A:N/A] Weeks of Treatment: [1:8] [N/A:N/A] Wound Status: [1:Open] [N/A:N/A] Measurements L x W x D 0.1x0.1x0.1 [N/A:N/A] (cm) Area (cm) : [1:0.008] [N/A:N/A] Volume (cm) : [1:0.001] [N/A:N/A] % Reduction in Area: [1:99.60%] [N/A:N/A] % Reduction in Volume: 99.90% [N/A:N/A] Classification: [1:Partial Thickness] [N/A:N/A] Exudate Amount: [1:Small] [N/A:N/A] Exudate Type: [1:Serous] [N/A:N/A] Exudate Color: [1:amber] [N/A:N/A] Wound Margin: [1:Distinct, outline attached] [N/A:N/A] Granulation Amount: [1:None Present (0%)] [N/A:N/A] Necrotic Amount: [1:Large (67-100%)] [N/A:N/A] Exposed Structures: [1:Fat Layer (Subcutaneous Tissue) Exposed: Yes] [N/A:N/A] Epithelialization: [1:Medium (34-66%)] [N/A:N/A] Periwound Skin Texture: Excoriation: No [1:Induration: No Callus: No] [N/A:N/A] Crepitus: No Rash: No Scarring: No Periwound Skin Dry/Scaly: Yes N/A N/A Moisture: Maceration: No Periwound  Skin Color: Atrophie Blanche: No N/A N/A Cyanosis: No Ecchymosis: No Erythema: No Hemosiderin Staining: No Mottled: No Pallor: No Rubor: No Temperature: No Abnormality N/A N/A Tenderness on Yes N/A N/A Palpation: Wound Preparation: Ulcer Cleansing: N/A N/A Rinsed/Irrigated with Saline, Other: surg scrub and water Topical Anesthetic Applied: Other: lidocaine 4% Treatment Notes Wound #1 (Right, Lateral Lower Leg) 1. Cleansed with: Cleanse wound with antibacterial soap and water 3. Peri-wound Care: Moisturizing lotion 4. Dressing Applied: Prisma Ag 5. Secondary [place] Signature(s) Signed: 12/03/2016 3:28:54 PM By: Lawanda Cousins Entered By: Lawanda Cousins on 12/03/2016 15:28:54 [name] ([id number]) -------------------------------------------------------------------------------- [place] Details Patient Name: Katherine [name]. Date of Service: 12/03/2016 2:45 PM Medical Record Number: [id number] Patient Account Number: [id number] Date of Birth/Sex: [birth year] (83 y.o. Female) Treating RN: Afful, RN, BSN, [employer] Primary Care Lysa Livengood: Thersa Salt Other Clinician: Referring Azaylah Stailey: Thersa Salt Treating Solenne Manwarren/Extender: Cathie Olden in Treatment: 8 Active Inactive ` Abuse / Safety / Falls / Self Care Management Nursing Diagnoses: Potential for falls Goals: Patient will remain injury free Date Initiated: 10/07/2016 Target Resolution Date: 12/18/2016 Goal Status: Active Interventions: Assess fall risk on admission and as needed Assess impairment of mobility on admission and as needed per policy Notes: ` Nutrition Nursing Diagnoses: Imbalanced nutrition Goals: Patient/caregiver agrees to and verbalizes understanding of need to use nutritional supplements and/or vitamins as prescribed Date Initiated: 10/07/2016 Target Resolution Date: 12/18/2016 Goal Status: Active Interventions: Assess patient nutrition  upon admission and as needed per policy Notes: ` Orientation to the Wound Care Program Nursing Diagnoses: ADELHEID, HOGGARD ([id number]) Knowledge deficit related to the wound healing center program Goals: Patient/caregiver will verbalize understanding of the [facility] Program Date Initiated: 10/07/2016 Target Resolution Date: 10/23/2016 Goal Status: Active Interventions: Provide education on orientation to the wound center Notes: ` Pain, Acute or Chronic Nursing Diagnoses: Pain, acute or chronic: actual or potential Potential alteration in comfort, pain Goals: Patient/caregiver will verbalize adequate pain control between visits Date Initiated: 10/07/2016 Target Resolution Date: 12/18/2016 Goal Status: Active Interventions: Assess comfort goal upon admission Complete pain assessment as per visit requirements Notes: ` Wound/Skin Impairment Nursing Diagnoses: Impaired tissue integrity Knowledge deficit related to smoking impact on wound healing Knowledge deficit related to ulceration/compromised skin integrity Goals: Ulcer/skin breakdown will have a volume reduction of 80% by week 12 Date Initiated: 10/07/2016 Target Resolution Date: 12/11/2016 Goal Status: Active Interventions: Assess patient/caregiver ability to perform ulcer/skin care regimen upon admission and as needed Assess ulceration(s) every visit  PATRENA, SANTALUCIA [name]. ([id number]) Notes: Electronic Signature(s) Signed: 12/03/2016 4:17:26 PM By: Regan Lemming BSN, RN Entered By: Regan Lemming on 12/03/2016 15:06:34 [place], [place] ([id number]) -------------------------------------------------------------------------------- Pain Assessment Details Patient Name: Katherine [name]. Date of Service: 12/03/2016 2:45 PM Medical Record Number: [id number] Patient Account Number: [id number] Date of Birth/Sex: 02-27-1930 (83 y.o. Female) Treating RN: Baruch Gouty, RN, BSN, Velva Harman Primary Care Quinnie Barcelo: Thersa Salt Other Clinician: Referring  Haris Baack: Thersa Salt Treating Aron Needles/Extender: Cathie Olden in Treatment: 8 Active Problems Location of Pain Severity and Description of Pain Patient Has Paino No Site Locations With Dressing Change: No Pain Management and Medication Current Pain Management: Electronic Signature(s) Signed: 12/03/2016 4:17:26 PM By: Regan Lemming BSN, RN Entered By: Regan Lemming on 12/03/2016 14:49:24 [name] ([id number]) -------------------------------------------------------------------------------- Patient/Caregiver Education Details Patient Name: Katherine [name]. Date of Service: 12/03/2016 2:45 PM Medical Record Number: [id number] Patient Account Number: [id number] Date of Birth/Gender: 1930-06-01 (83 y.o. Female) Treating RN: Baruch Gouty, RN, BSN, Velva Harman Primary Care Physician: Thersa Salt Other Clinician: Referring Physician: Thersa Salt Treating Physician/Extender: Cathie Olden in Treatment: 8 Education Assessment Education Provided To: Patient Education Topics Provided Welcome To [facility]: Methods: Explain/Verbal Responses: State content correctly Wound/Skin Impairment: Methods: Explain/Verbal Responses: State content correctly Electronic Signature(s) Signed: 12/03/2016 4:17:26 PM By: Regan Lemming BSN, RN Entered By: Regan Lemming on 12/03/2016 15:19:53 Katherine [name]. ([id number]) -------------------------------------------------------------------------------- Wound Assessment Details Patient Name: Katherine [name]. Date of Service: 12/03/2016 2:45 PM Medical Record Number: [id number] Patient Account Number: [id number] Date of Birth/Sex: [birth year] (83 y.o. Female) Treating RN: Afful, RN, BSN, [place] Primary Care Hiroshi Krummel: Thersa Salt Other Clinician: Referring Shoaib Siefker: Thersa Salt Treating Deuce Paternoster/Extender: Cathie Olden in Treatment: 8 Wound Status Wound Number: 1 Primary Open Surgical Wound Etiology: Wound Location: Right Lower Leg - Lateral Wound  Open Wounding Event: Surgical Injury Status: Date Acquired: 08/24/2016 Comorbid Congestive Heart Failure, Weeks Of Treatment: 8 History: Hypertension, Myocardial Infarction, Clustered Wound: No Osteoarthritis, Neuropathy Photos Photo Uploaded By: Regan Lemming on 12/03/2016 16:20:16 Wound Measurements Length: (cm) 0.1 Width: (cm) 0.1 Depth: (cm) 0.1 Area: (cm) 0.008 Volume: (cm) 0.001 % Reduction in Area: 99.6% % Reduction in Volume: 99.9% Epithelialization: Medium (34-66%) Tunneling: No Undermining: No Wound Description Classification: Partial Thickness Foul Odor Aft Wound Margin: Distinct, outline attached Slough/Fibrin Exudate Amount: Small Exudate Type: Serous Exudate Color: amber er Cleansing: No o No Wound Bed Granulation Amount: None Present (0%) Exposed Structure Necrotic Amount: Large (67-100%) Fat Layer (Subcutaneous Tissue) Exposed: Yes Necrotic Quality: Adherent [place], Edmundson [name]. ([id number]) Periwound Skin Texture Texture Color No Abnormalities Noted: No No Abnormalities Noted: No Callus: No Atrophie Blanche: No Crepitus: No Cyanosis: No Excoriation: No Ecchymosis: No Induration: No Erythema: No Rash: No Hemosiderin Staining: No Scarring: No Mottled: No Pallor: No Moisture Rubor: No No Abnormalities Noted: No Dry / Scaly: Yes Temperature / Pain Maceration: No Temperature: No Abnormality Tenderness on Palpation: Yes Wound Preparation Ulcer Cleansing: Rinsed/Irrigated with Saline, Other: surg scrub and water, Topical Anesthetic Applied: Other: lidocaine 4%, Treatment Notes Wound #1 (Right, Lateral Lower Leg) 1. Cleansed with: Cleanse wound with antibacterial soap and water 3. Peri-wound Care: Moisturizing lotion 4. Dressing Applied: Prisma Ag 5. Secondary [place] Signature(s) Signed: 12/03/2016 4:17:26 PM By: Regan Lemming BSN, RN Entered By: Regan Lemming on 12/03/2016 14:57:50 Katherine Juba IMarland Kitchen  ([id number]) -------------------------------------------------------------------------------- [place] Details Patient Name: Katherine [name]. Date of Service: 12/03/2016 2:45 PM Medical Record Number: [id number] Patient Account Number: [id number] Date of Birth/Sex:  26-Jul-1930 (83 y.o. Female) Treating RN: Afful, RN, BSN, [place] Primary Care Izzy Courville: Thersa Salt Other Clinician: Referring Makailyn Mccormick: Thersa Salt Treating Adaly Puder/Extender: Cathie Olden in Treatment: 8 Vital Signs Time Taken: 14:49 Temperature (F): 98.2 Height (in): 61 Pulse (bpm): 52 Weight (lbs): 140 Respiratory Rate (breaths/min): 16 Body Mass Index (BMI): 26.4 Blood Pressure (mmHg): 165/63 Reference Range: 80 - 120 mg / dl Electronic Signature(s) Signed: 12/03/2016 4:17:26 PM By: Regan Lemming BSN, RN Entered By: Regan Lemming on 12/03/2016 14:52:50

## 2016-12-06 DIAGNOSIS — S82841D Displaced bimalleolar fracture of right lower leg, subsequent encounter for closed fracture with routine healing: Secondary | ICD-10-CM | POA: Diagnosis not present

## 2016-12-06 DIAGNOSIS — M7542 Impingement syndrome of left shoulder: Secondary | ICD-10-CM | POA: Diagnosis not present

## 2016-12-07 ENCOUNTER — Telehealth: Payer: Self-pay | Admitting: Family Medicine

## 2016-12-07 DIAGNOSIS — M25511 Pain in right shoulder: Secondary | ICD-10-CM | POA: Diagnosis not present

## 2016-12-07 DIAGNOSIS — R69 Illness, unspecified: Secondary | ICD-10-CM | POA: Diagnosis not present

## 2016-12-07 DIAGNOSIS — M1990 Unspecified osteoarthritis, unspecified site: Secondary | ICD-10-CM | POA: Diagnosis not present

## 2016-12-07 DIAGNOSIS — I5042 Chronic combined systolic (congestive) and diastolic (congestive) heart failure: Secondary | ICD-10-CM | POA: Diagnosis not present

## 2016-12-07 DIAGNOSIS — J449 Chronic obstructive pulmonary disease, unspecified: Secondary | ICD-10-CM | POA: Diagnosis not present

## 2016-12-07 DIAGNOSIS — I2510 Atherosclerotic heart disease of native coronary artery without angina pectoris: Secondary | ICD-10-CM | POA: Diagnosis not present

## 2016-12-07 DIAGNOSIS — E785 Hyperlipidemia, unspecified: Secondary | ICD-10-CM | POA: Diagnosis not present

## 2016-12-07 DIAGNOSIS — I110 Hypertensive heart disease with heart failure: Secondary | ICD-10-CM | POA: Diagnosis not present

## 2016-12-07 DIAGNOSIS — I255 Ischemic cardiomyopathy: Secondary | ICD-10-CM | POA: Diagnosis not present

## 2016-12-07 DIAGNOSIS — S82851D Displaced trimalleolar fracture of right lower leg, subsequent encounter for closed fracture with routine healing: Secondary | ICD-10-CM | POA: Diagnosis not present

## 2016-12-07 NOTE — Telephone Encounter (Signed)
Pt complains about BP being up and down. She also complains of breaking out in a sweat. Pt is going to [place] for the summer on Thursday 12/09/16. She is scheduled to see you on 4/26 at 1:15 a.m. She is going to come with her son. She will let us know if she is unable to make it. She wants to keep you has PCP but will have a cardiologist and PCP is [place] as well.

## 2016-12-07 NOTE — Telephone Encounter (Signed)
Pt lvm stating that she needs to speak to Dr. Lacinda Axon or his nurse only. She said that she needs information. She did not state what kind of information.

## 2016-12-09 ENCOUNTER — Telehealth: Payer: Self-pay | Admitting: *Deleted

## 2016-12-09 ENCOUNTER — Ambulatory Visit (INDEPENDENT_AMBULATORY_CARE_PROVIDER_SITE_OTHER): Payer: Medicare HMO | Admitting: Family Medicine

## 2016-12-09 ENCOUNTER — Encounter: Payer: Self-pay | Admitting: Family Medicine

## 2016-12-09 DIAGNOSIS — I2510 Atherosclerotic heart disease of native coronary artery without angina pectoris: Secondary | ICD-10-CM | POA: Diagnosis not present

## 2016-12-09 DIAGNOSIS — I10 Essential (primary) hypertension: Secondary | ICD-10-CM

## 2016-12-09 MED ORDER — AMLODIPINE BESYLATE 5 MG PO TABS: 5.0000 mg | ORAL_TABLET | Freq: Every day | ORAL | 3 refills | Status: AC

## 2016-12-09 MED ORDER — ONDANSETRON HCL 4 MG PO TABS: 4.0000 mg | ORAL_TABLET | Freq: Three times a day (TID) | ORAL | 0 refills | Status: DC | PRN

## 2016-12-09 NOTE — Telephone Encounter (Signed)
Spoke with pt and her niece and both understood to take 10 mg of Norvasc. Pt stated that she will follow up in 2 weeks with Dr.Cook.

## 2016-12-09 NOTE — Assessment & Plan Note (Signed)
Unsure of whether she's having angina based on history. Discussed with cardiology. Adding norvasc today.

## 2016-12-09 NOTE — Progress Notes (Signed)
Pre visit review using our clinic review tool, if applicable. No additional management support is needed unless otherwise documented below in the visit note. 

## 2016-12-09 NOTE — Assessment & Plan Note (Signed)
BP elevated. Adding norvasc. Advised compliance with medications.

## 2016-12-09 NOTE — Telephone Encounter (Signed)
Please inform Dr. Lacinda Axon that Patient had Norvasc 2.5 mg ordered back in [place] caregiver  Pt contact [phone]

## 2016-12-09 NOTE — Patient Instructions (Signed)
I have added Norvasc after talking to [place].  Keep an eye on your pressures.  You need to be seen soon for follow up.  Take care  Dr. Lacinda Axon

## 2016-12-09 NOTE — Progress Notes (Signed)
Subjective:  Patient ID: Katherine [name], female    DOB: 1929-12-04  Age: 83 y.o. MRN: [id number]  CC: Elevated BP, ? Chest pain  HPI:  83 year old female with an extensive cardiac history (CAD, CHF, Ischemic cardiomyopathy) presents with the above complaints.  Patient states that recently her blood pressure has been quite elevated. She seen systolics in the 160F at home. She states that she's had some associated "sweats". These do not appear to be associated with exertion. She endorses compliance with medications although I'm not sure of this. She states that approximately 2 weeks ago she had an episode where her "heart just didn't feel right". He states that it felt like it was fluttering. Reports some associated discomfort. She states that she took 2 nitroglycerin within a few minutes apart with resolution. She denies chest pain. No reports or radiation.  Additionally, she states that she's had some intermittent nausea as of late following recent antibiotic.  Social Hx   Social History   Social History  . Marital status: Single    Spouse name: N/A  . Number of children: N/A  . Years of education: N/A   Social History Main Topics  . Smoking status: Never Smoker  . Smokeless tobacco: Never Used  . Alcohol use No  . Drug use: No  . Sexual activity: No   Other Topics Concern  . None   Social History Narrative   Patient lives in [place] but visits her family in [place] frequently. She is very active and walks often.    Review of Systems  Constitutional: Positive for diaphoresis.  Cardiovascular:       ? Chest pain.  Gastrointestinal: Positive for nausea.   Objective:  BP (!) 166/94 (BP Location: Left Arm, Cuff Size: Normal)   Pulse (!) 58   Temp 97.5 F (36.4 C) (Oral)   Wt 146 lb 6 oz (66.4 kg)   SpO2 95%   BMI 27.66 kg/m   BP/Weight 12/09/2016 0/04/3234 12/21/3218  Systolic BP 254 270 623  Diastolic BP 94 77 76  Wt. (Lbs) 146.38 120 119  BMI 27.66 22.67 22.48    Physical Exam  Constitutional: She is oriented to person, place, and time. She appears well-developed. No distress.  HENT:  Head: Normocephalic and atraumatic.  Cardiovascular: Regular rhythm.   Pulmonary/Chest: Effort normal and breath sounds normal.  Neurological: She is alert and oriented to person, place, and time.  Psychiatric: She has a normal mood and affect.  Vitals reviewed.  Lab Results  Component Value Date   WBC 12.0 (H) 10/28/2016   HGB 12.2 10/28/2016   HCT 36.8 10/28/2016   PLT 163 10/28/2016   GLUCOSE 117 (H) 10/28/2016   CHOL 114 12/16/2015   TRIG 126 12/16/2015   HDL 41 12/16/2015   LDLCALC 48 12/16/2015   ALT 25 10/28/2016   AST 46 (H) 10/28/2016   NA 138 10/28/2016   K 3.9 10/28/2016   CL 104 10/28/2016   CREATININE 0.82 10/28/2016   BUN 11 10/28/2016   CO2 28 10/28/2016   TSH 0.345 (L) 10/30/2015   INR 1.12 10/30/2015   HGBA1C 6.0 (H) 10/30/2015    Assessment & Plan:   Problem List Items Addressed This Visit    Occlusive coronary artery disease requiring drug therapy (Chronic)    Unsure of whether she's having angina based on history. Discussed with cardiology. Adding norvasc today.      Relevant Medications   amLODipine (NORVASC) 5 MG tablet  Essential hypertension    BP elevated. Adding norvasc. Advised compliance with medications.      Relevant Medications   amLODipine (NORVASC) 5 MG tablet      Meds ordered this encounter  Medications  . ondansetron (ZOFRAN) 4 MG tablet    Sig: Take 1 tablet (4 mg total) by mouth every 8 (eight) hours as needed for nausea or vomiting.    Dispense:  20 tablet    Refill:  0  . amLODipine (NORVASC) 5 MG tablet    Sig: Take 1 tablet (5 mg total) by mouth daily.    Dispense:  90 tablet    Refill:  3   Follow-up: PRN  [place]

## 2016-12-10 ENCOUNTER — Ambulatory Visit: Payer: Medicare HMO | Admitting: Physician Assistant

## 2016-12-16 ENCOUNTER — Telehealth: Payer: Self-pay | Admitting: Family Medicine

## 2016-12-16 ENCOUNTER — Telehealth: Payer: Self-pay

## 2016-12-16 MED ORDER — CARVEDILOL 6.25 MG PO TABS: 6.2500 mg | ORAL_TABLET | Freq: Two times a day (BID) | ORAL | 3 refills | Status: AC

## 2016-12-16 NOTE — Telephone Encounter (Signed)
Refill sent and pt made aware 

## 2016-12-16 NOTE — Telephone Encounter (Signed)
Pt says she is still in pain from last procedure pt is saying that shot did not work please call pt [phone]

## 2016-12-16 NOTE — Telephone Encounter (Signed)
Pt called requesting a refill on her carvedilol (COREG) 6.25 MG tablet. She would like a 90 day supply. Please advise, thank you!  Call pt @ [phone]  Pharmacy - [id number] - [place], [place]

## 2016-12-17 ENCOUNTER — Encounter: Payer: Medicare HMO | Attending: Surgery | Admitting: Surgery

## 2016-12-17 DIAGNOSIS — I509 Heart failure, unspecified: Secondary | ICD-10-CM | POA: Insufficient documentation

## 2016-12-17 DIAGNOSIS — M84471G Pathological fracture, right ankle, subsequent encounter for fracture with delayed healing: Secondary | ICD-10-CM | POA: Diagnosis not present

## 2016-12-17 DIAGNOSIS — I252 Old myocardial infarction: Secondary | ICD-10-CM | POA: Diagnosis not present

## 2016-12-17 DIAGNOSIS — E785 Hyperlipidemia, unspecified: Secondary | ICD-10-CM | POA: Diagnosis not present

## 2016-12-17 DIAGNOSIS — I890 Lymphedema, not elsewhere classified: Secondary | ICD-10-CM | POA: Insufficient documentation

## 2016-12-17 DIAGNOSIS — T8131XA Disruption of external operation (surgical) wound, not elsewhere classified, initial encounter: Secondary | ICD-10-CM | POA: Diagnosis not present

## 2016-12-17 DIAGNOSIS — R69 Illness, unspecified: Secondary | ICD-10-CM | POA: Diagnosis not present

## 2016-12-17 DIAGNOSIS — I110 Hypertensive heart disease with heart failure: Secondary | ICD-10-CM | POA: Insufficient documentation

## 2016-12-17 DIAGNOSIS — M1990 Unspecified osteoarthritis, unspecified site: Secondary | ICD-10-CM | POA: Diagnosis not present

## 2016-12-17 DIAGNOSIS — I5042 Chronic combined systolic (congestive) and diastolic (congestive) heart failure: Secondary | ICD-10-CM | POA: Diagnosis not present

## 2016-12-17 DIAGNOSIS — S82851D Displaced trimalleolar fracture of right lower leg, subsequent encounter for closed fracture with routine healing: Secondary | ICD-10-CM | POA: Diagnosis not present

## 2016-12-17 DIAGNOSIS — Y839 Surgical procedure, unspecified as the cause of abnormal reaction of the patient, or of later complication, without mention of misadventure at the time of the procedure: Secondary | ICD-10-CM | POA: Diagnosis not present

## 2016-12-17 DIAGNOSIS — Z7982 Long term (current) use of aspirin: Secondary | ICD-10-CM | POA: Insufficient documentation

## 2016-12-17 DIAGNOSIS — I2510 Atherosclerotic heart disease of native coronary artery without angina pectoris: Secondary | ICD-10-CM | POA: Diagnosis not present

## 2016-12-17 DIAGNOSIS — G629 Polyneuropathy, unspecified: Secondary | ICD-10-CM | POA: Insufficient documentation

## 2016-12-17 DIAGNOSIS — I255 Ischemic cardiomyopathy: Secondary | ICD-10-CM | POA: Diagnosis not present

## 2016-12-17 DIAGNOSIS — L97311 Non-pressure chronic ulcer of right ankle limited to breakdown of skin: Secondary | ICD-10-CM | POA: Insufficient documentation

## 2016-12-17 DIAGNOSIS — M25511 Pain in right shoulder: Secondary | ICD-10-CM | POA: Diagnosis not present

## 2016-12-17 DIAGNOSIS — J449 Chronic obstructive pulmonary disease, unspecified: Secondary | ICD-10-CM | POA: Diagnosis not present

## 2016-12-17 DIAGNOSIS — L97312 Non-pressure chronic ulcer of right ankle with fat layer exposed: Secondary | ICD-10-CM | POA: Diagnosis not present

## 2016-12-17 NOTE — Telephone Encounter (Signed)
Attempted to call.  Unable to leave message on voicemail.

## 2016-12-19 NOTE — Progress Notes (Signed)
Katherine, [name] ([id number]) Visit Report for 12/17/2016 Chief Complaint Document Details Patient Name: Katherine [name], Katherine [name] [name]. Date of Service: 12/17/2016 2:45 PM Medical Record Number: [id number] Patient Account Number: [id number] Date of Birth/Sex: Dec 19, 1929 (83 y.o. Female) Treating RN: Baruch Gouty, RN, BSN, Velva Harman Primary Care Provider: Thersa Salt Other Clinician: Referring Provider: Thersa Salt Treating Provider/Extender: Frann Rider in Treatment: 10 Information Obtained from: Patient Chief Complaint She arrives for follow up evaluation of her right lateral leg ulcers Electronic Signature(s) Signed: 12/17/2016 4:12:08 PM By: Christin Fudge MD, FACS Entered By: Christin Fudge on 12/17/2016 16:12:08 Katherine [name] ([id number]) -------------------------------------------------------------------------------- HPI Details Patient Name: Katherine [name]. Date of Service: 12/17/2016 2:45 PM Medical Record Number: [id number] Patient Account Number: [id number] Date of Birth/Sex: 1930-04-15 (83 y.o. Female) Treating RN: Baruch Gouty, RN, BSN, Velva Harman Primary Care Provider: Thersa Salt Other Clinician: Referring Provider: Thersa Salt Treating Provider/Extender: Frann Rider in Treatment: 10 History of Present Illness Location: right lateral ankle Quality: Patient reports experiencing a dull pain to affected area(s). Severity: Patient states wound (s) are getting better. Duration: Patient has had the wound for < 6 weeks prior to presenting for treatment Timing: Pain in wound is constant (hurts all the time) Context: The wound appeared gradually over time Modifying Factors: Other treatment(s) tried include:nonweightbearing boot Associated Signs and Symptoms: Patient reports having increase swelling. HPI Description: 83 year old patient who has recently been seen several times at the orthopedic office for problems with her wound healing after she had a right ankle trimalleolar fracture repaired on 08/24/2016. He  simply did notice some drainage from the incision and she was reviewed in the orthopedic office on 10/01/2016 and started on Keflex and Septra. She was sent to [place] for an opinion is also than be following up with the orthopedic physician. Most recent x-rays taken of the orthopedic office were within normal limits. The patient does not have any significant history of diabetes mellitus or smoking but has had edema problems in the legs and does say that she's seen a vein surgeon in the past. All this is mainly done in [place], [place] where she normally resides. 11/19/2016 -- she saw her orthopedic surgeon who has asked her to weight-bear without the orthopedic shoe and she has been walking around without much problems. 11/25/2016 -- she is going to be in town for another couple of weeks and we will continue with compression wraps. Her insurance company did not cover juxta lites. 12/03/16- she arrives for follow up evaluation of her right lateral leg ulcers. She has a follow up appointment with orthopedics on Monday and will have an x-ray at that time. She has been full weight bearing with no brace x2 weeks. She is voicing no complaints or concerns 12/17/2016 -- the orthopedic physician has reviewed her and has discharged her from his care. She is still seeing her PCP regarding hypertension control. Other than that she is ready to leave back for a home down in [place]) Signed: 12/17/2016 4:13:11 PM By: Christin Fudge MD, FACS Entered By: Christin Fudge on 12/17/2016 16:13:11 Katherine [name]. ([id number]) -------------------------------------------------------------------------------- Physical Exam Details Patient Name: Katherine [name]. Date of Service: 12/17/2016 2:45 PM Medical Record Number: [id number] Patient Account Number: [id number] Date of Birth/Sex: Oct 30, 1929 (83 y.o. Female) Treating RN: Afful, RN, BSN, Velva Harman Primary Care Provider: Thersa Salt Other Clinician: Referring  Provider: Thersa Salt Treating Provider/Extender: Frann Rider in Treatment: 10 Constitutional . Pulse regular. Respirations normal and unlabored. Afebrile. . Eyes Nonicteric. Reactive to light.  Ears, Nose, Mouth, and Throat Lips, teeth, and gums WNL.Marland Kitchen Moist mucosa without lesions. Neck supple and nontender. No palpable supraclavicular or cervical adenopathy. Normal sized without goiter. Respiratory WNL. No retractions.. Breath sounds WNL, No rubs, rales, rhonchi, or wheeze.. Cardiovascular Heart rhythm and rate regular, no murmur or gallop.. Pedal Pulses WNL. No clubbing, cyanosis or edema. Chest Breasts symmetical and no nipple discharge.. Breast tissue WNL, no masses, lumps, or tenderness.. Lymphatic No adneopathy. No adenopathy. No adenopathy. Musculoskeletal Adexa without tenderness or enlargement.. Digits and nails w/o clubbing, cyanosis, infection, petechiae, ischemia, or inflammatory conditions.. Integumentary (Hair, Skin) No suspicious lesions. No crepitus or fluctuance. No peri-wound warmth or erythema. No masses.Marland Kitchen Psychiatric Judgement and insight Intact.. No evidence of depression, anxiety, or agitation.. Notes the superior part of the wound is completely healed and the inferior part has a few millimeters of open area Electronic Signature(s) Signed: 12/17/2016 4:13:35 PM By: Christin Fudge MD, FACS Entered By: Christin Fudge on 12/17/2016 16:13:35 Katherine [name] ([id number]) -------------------------------------------------------------------------------- Physician Orders Details Patient Name: Katherine [name]. Date of Service: 12/17/2016 2:45 PM Medical Record Number: [id number] Patient Account Number: [id number] Date of Birth/Sex: Jan 05, 1930 (83 y.o. Female) Treating RN: Afful, RN, BSN, Velva Harman Primary Care Provider: Thersa Salt Other Clinician: Referring Provider: Thersa Salt Treating Provider/Extender: Frann Rider in Treatment: 10 Verbal / Phone Orders:  No Diagnosis Coding Wound Cleansing Wound #1 Right,Lateral Lower Leg o Cleanse wound with mild soap and water - in clinic Skin Barriers/Peri-Wound Care Wound #1 Right,Lateral Lower Leg o Moisturizing lotion Primary Wound Dressing Wound #1 Right,Lateral Lower Leg o Medihoney gel - [employer] in the retail stores Secondary Dressing Wound #1 Right,Lateral Lower Leg o Non-adherent pad - [place] Dressing Change Frequency Wound #1 Right,Lateral Lower Leg o Change dressing every day. Follow-up Appointments Wound #1 Right,Lateral Lower Leg o Return Appointment in 1 week. Edema Control Wound #1 Right,Lateral Lower Leg o Patient to wear own compression stockings o Elevate legs to the level of the heart and pump ankles as often as possible Additional Orders / Instructions Wound #1 Right,Lateral Lower Leg o Increase protein intake. Vivian ([id number]) Wound #1 Right,Lateral Lower Leg o [place] Visits - [place] Nurse may visit PRN to address patientos wound care needs. o FACE TO FACE ENCOUNTER: MEDICARE and MEDICAID PATIENTS: [name] that this patient is under my care and that [name] had a face-to-face encounter that meets the physician face-to-face encounter requirements with this patient on this date. The encounter with the patient was in whole or in part for the following MEDICAL CONDITION: (primary reason for [place]) MEDICAL NECESSITY: [name], that based on my findings, NURSING services are a medically necessary home health service. HOME BOUND STATUS: [name] that my clinical findings support that this patient is homebound ([name].e., Due to illness or injury, pt requires aid of supportive devices such as crutches, cane, wheelchairs, walkers, the use of special transportation or the assistance of another person to leave their place of residence. There is a normal inability to leave the home and doing  so requires considerable and taxing effort. Other absences are for medical reasons / religious services and are infrequent or of short duration when for other reasons). o If current dressing causes regression in wound condition, may D/C ordered dressing product/s and apply Normal Saline Moist Dressing daily until next [place] / Other MD appointment. [place] of regression in wound condition at [phone].   o Please direct any NON-WOUND related issues/requests for orders to patient's Primary Care Physician Electronic Signature(s) Signed: 12/17/2016 4:27:48 PM By: Christin Fudge MD, FACS Signed: 12/17/2016 4:33:31 PM By: Regan Lemming BSN, RN Entered By: Regan Lemming on 12/17/2016 15:35:17 Katherine Juba IMarland Kitchen ([id number]) -------------------------------------------------------------------------------- Problem List Details Patient Name: Katherine [name]. Date of Service: 12/17/2016 2:45 PM Medical Record Number: [id number] Patient Account Number: [id number] Date of Birth/Sex: 09-21-1929 (83 y.o. 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 Date of Service: 12/17/2016 2:45 PM Medical Record Number: [id number] Patient Account Number: [id number] Date of Birth/Sex: Apr 12, 1930 (83 y.o. Female) Treating RN: Afful, RN, BSN, Velva Harman Primary Care Provider: Thersa Salt Other Clinician: Referring Provider: Thersa Salt Treating Provider/Extender: Frann Rider in Treatment: 10 Subjective Chief Complaint Information obtained from Patient She arrives for follow up evaluation of her right lateral leg ulcers History of Present Illness (HPI) The following HPI elements were documented for the patient's wound: Location: right lateral ankle Quality: Patient reports experiencing a dull pain to affected area(s). Severity: Patient states wound (s) are getting better. Duration: Patient has had the wound for < 6 weeks prior to presenting for treatment Timing: Pain in wound is constant (hurts all the time) Context: The wound appeared gradually over time Modifying Factors: Other treatment(s) tried include:nonweightbearing boot Associated Signs and Symptoms: Patient reports having increase swelling. 83 year old patient who has recently been seen several times at the orthopedic office for problems with her wound healing after she had a right ankle trimalleolar fracture repaired on 08/24/2016. He simply did notice some drainage from the incision and she was reviewed in the orthopedic office on 10/01/2016 and started on Keflex and Septra. She was sent to [place] for an opinion is also than be following up with the orthopedic physician. Most recent x-rays taken of the orthopedic office were within normal limits. The patient does not have any significant history of diabetes mellitus or smoking but has had edema problems in the legs and does say that she's seen a vein surgeon in the past. All this is mainly done  in [place], [place] where she normally resides. 11/19/2016 -- she saw her orthopedic surgeon who has asked her to weight-bear without the orthopedic shoe and she has been walking around without much problems. 11/25/2016 -- she is going to be in town for another couple of weeks and we will continue with compression wraps. Her insurance company did not cover juxta lites. 12/03/16- she arrives for follow up evaluation of her right lateral leg ulcers. She has a follow up appointment with orthopedics on Monday and will have an x-ray at that time. She has been full weight bearing with no brace x2 weeks. She is voicing no complaints or concerns 12/17/2016 -- the orthopedic physician has reviewed her and has discharged her from his care. She is still seeing her PCP  regarding hypertension control. Other than that she is ready to leave back for a home down in [place], [place] [name]. ([id number]) Objective Constitutional Pulse regular. Respirations normal and unlabored. Afebrile. Vitals Time Taken: 3:00 PM, Height: 61 in, Weight: 140 lbs, BMI: 26.4, Temperature: 98.5 F, Pulse: 59 bpm, Respiratory Rate: 16 breaths/min, Blood Pressure: 145/54 mmHg. Eyes Nonicteric. Reactive to light. Ears, Nose, Mouth, and Throat Lips, teeth, and gums WNL.Marland Kitchen Moist mucosa without lesions. Neck supple and nontender. No palpable supraclavicular or cervical adenopathy. Normal sized without goiter. Respiratory WNL. No retractions.. Breath sounds WNL, No rubs, rales, rhonchi, or wheeze.. Cardiovascular Heart rhythm and rate regular, no murmur or gallop.. Pedal Pulses WNL. No clubbing, cyanosis or edema. Chest Breasts symmetical and no nipple discharge.. Breast tissue WNL, no masses, lumps, or tenderness.. Lymphatic No adneopathy. No adenopathy. No adenopathy. Musculoskeletal Adexa without tenderness or enlargement.. Digits and nails w/o clubbing, cyanosis, infection, petechiae, ischemia, or inflammatory  conditions.Marland Kitchen Psychiatric Judgement and insight Intact.. No evidence of depression, anxiety, or agitation.. General Notes: the superior part of the wound is completely healed and the inferior part has a few millimeters of open area Integumentary (Hair, Skin) No suspicious lesions. No crepitus or fluctuance. No peri-wound warmth or erythema. No masses.. Wound #1 status is Open. Original cause of wound was Surgical Injury. The wound is located on the Right,Lateral Lower Leg. The wound measures 1cm length x 0.5cm width x 0.2cm depth; 0.393cm^2 area and 0.079cm^3 volume. There is Fat Layer (Subcutaneous Tissue) Exposed exposed. There is no tunneling Ashley, Gisele [name]. ([id number]) or undermining noted. There is a small amount of serous drainage noted. The wound margin is distinct with the outline attached to the wound base. There is no granulation within the wound bed. There is a large (67- 100%) amount of necrotic tissue within the wound bed including Adherent Slough. The periwound skin appearance did not exhibit: Callus, Crepitus, Excoriation, Induration, Rash, Scarring, Dry/Scaly, Maceration, Atrophie Blanche, Cyanosis, Ecchymosis, Hemosiderin Staining, Mottled, Pallor, Rubor, Erythema. Periwound temperature was noted as No Abnormality. 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 I89.0 - Lymphedema, not elsewhere classified M84.471G - Pathological fracture, right ankle, subsequent encounter for fracture with delayed healing Plan Wound Cleansing: Wound #1 Right,Lateral Lower Leg: Cleanse wound with mild soap and water - in clinic Skin Barriers/Peri-Wound Care: Wound #1 Right,Lateral Lower Leg: Moisturizing lotion Primary Wound Dressing: Wound #1 Right,Lateral Lower Leg: Medihoney gel - [employer] in the retail  stores Secondary Dressing: Wound #1 Right,Lateral Lower Leg: Non-adherent pad - [place] Dressing Change Frequency: Wound #1 Right,Lateral Lower Leg: Change dressing every day. Follow-up Appointments: Wound #1 Right,Lateral Lower Leg: Return Appointment in 1 week. Edema Control: Wound #1 Right,Lateral Lower Leg: Patient to wear own compression stockings Elevate legs to the level of the heart and pump ankles as often as possible Additional Orders / Instructions: BRITTINY, LEVITZ IMarland Kitchen ([id number]) Wound #1 Right,Lateral Lower Leg: Increase protein intake. Home Health: Wound #1 Right,Lateral Lower Leg: Continue Home Health Visits - [place] Nurse may visit PRN to address patient s wound care needs. FACE TO FACE ENCOUNTER: MEDICARE and MEDICAID PATIENTS: [name] that this patient is under my care and that [name] had a face-to-face encounter that meets the physician face-to-face encounter requirements with this patient on this date. The encounter with the patient was in whole or in part for the  following MEDICAL CONDITION: (primary reason for Home Healthcare) MEDICAL NECESSITY: [name], that based on my findings, NURSING services are a medically necessary home health service. HOME BOUND STATUS: [name] that my clinical findings support that this patient is homebound ([name].e., Due to illness or injury, pt requires aid of supportive devices such as crutches, cane, wheelchairs, walkers, the use of special transportation or the assistance of another person to leave their place of residence. There is a normal inability to leave the home and doing so requires considerable and taxing effort. Other absences are for medical reasons / religious services and are infrequent or of short duration when for other reasons). If current dressing causes regression in wound condition, may D/C ordered dressing product/s and apply Normal Saline Moist Dressing daily until next [place] / Other  MD appointment. [place] of regression in wound condition at [phone]. Please direct any NON-WOUND related issues/requests for orders to patient's Primary Care Physician She has been discharged from the orthopedic services and now is ambulating with a walker. After review [name] have recommended: 1. Medihoney to be used and covered with a small bordered foam and she will use her compression stockings 2. Elevation has been recommended 3. ambulate as much as possible. Electronic Signature(s) Signed: 12/17/2016 4:15:18 PM By: Christin Fudge MD, FACS Entered By: Christin Fudge on 12/17/2016 16:15:18 Katherine Juba IMarland Kitchen ([id number]) -------------------------------------------------------------------------------- SuperBill Details Patient Name: Katherine [name]. Date of Service: 12/17/2016 Medical Record Number: [id number] Patient Account Number: [id number] Date of Birth/Sex: 1929/09/05 (83 y.o. 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 I89.0 Lymphedema, not elsewhere classified M84.471G Pathological fracture, right ankle, subsequent encounter for fracture with delayed healing Facility Procedures CPT4 Code: 32122482 Description: 50037 - WOUND CARE VISIT-LEV 2 EST PT Modifier: Quantity: 1 Physician Procedures CPT4: Description Modifier Quantity Code [id number] 99213 - WC PHYS LEVEL 3 - EST PT 1 ICD-10 Description Diagnosis T81.31XA Disruption of external operation (surgical) wound, not elsewhere classified, initial encounter L97.311 Non-pressure chronic ulcer of  right ankle limited to breakdown of skin I89.0  Lymphedema, not elsewhere classified M84.471G Pathological fracture, right ankle, subsequent encounter for fracture with delayed healing Electronic Signature(s) Signed: 12/17/2016 4:27:48 PM By: Christin Fudge MD, FACS Signed: 12/17/2016 4:33:31 PM By: Regan Lemming BSN, RN Previous Signature: 12/17/2016 4:15:33 PM Version By: Christin Fudge MD, FACS Entered By: Regan Lemming on 12/17/2016 16:19:32

## 2016-12-19 NOTE — Progress Notes (Signed)
DIEDRA, SINOR ([id number]) Visit Report for 12/17/2016 Arrival Information Details Patient Name: Katherine [name], Katherine I. Date of Service: 12/17/2016 2:45 PM Medical Record Number: [id number] Patient Account Number: [id number] Date of Birth/Sex: February 16, 1930 (83 y.o. Female) Treating RN: Afful, RN, BSN, Velva Harman Primary Care Maelyn Berrey: Thersa Salt Other Clinician: Referring Hamlin Devine: Thersa Salt Treating Charm Stenner/Extender: Frann Rider in Treatment: 10 Visit Information History Since Last Visit All ordered tests and consults were completed: No Patient Arrived: Gilford Rile Added or deleted any medications: No Arrival Time: 14:56 Any new allergies or adverse reactions: No Accompanied By: niece Had a fall or experienced change in No Transfer Assistance: None activities of daily living that may affect Patient Identification Verified: Yes risk of falls: Secondary Verification Process Completed: Yes Signs or symptoms of abuse/neglect since last No Patient Requires Transmission-Based No visito Precautions: Hospitalized since last visit: No Patient Has Alerts: No Has Dressing in Place as Prescribed: Yes Pain Present Now: No Electronic Signature(s) Signed: 12/17/2016 3:16:25 PM By: Regan Lemming BSN, RN Entered By: Regan Lemming on 12/17/2016 15:16:25 [name] ([id number]) -------------------------------------------------------------------------------- Clinic Level of Care Assessment Details Patient Name: Katherine Juba I. Date of Service: 12/17/2016 2:45 PM Medical Record Number: [id number] Patient Account Number: [id number] Date of Birth/Sex: 1930/02/17 (84 y.o. Female) Treating RN: Afful, RN, BSN, [place] Primary Care Zeus Marquis: Thersa Salt Other Clinician: Referring Carylon Tamburro: Thersa Salt Treating Ariya Bohannon/Extender: Frann Rider in Treatment: 10 Clinic Level of Care Assessment Items TOOL 4 Quantity Score []  - Use when only an EandM is performed on FOLLOW-UP visit 0 ASSESSMENTS - Nursing Assessment  / Reassessment X - Reassessment of Co-morbidities (includes updates in patient status) 1 10 X - Reassessment of Adherence to Treatment Plan 1 5 ASSESSMENTS - Wound and Skin Assessment / Reassessment X - Simple Wound Assessment / Reassessment - one wound 1 5 []  - Complex Wound Assessment / Reassessment - multiple wounds 0 []  - Dermatologic / Skin Assessment (not related to wound area) 0 ASSESSMENTS - Focused Assessment []  - Circumferential Edema Measurements - multi extremities 0 []  - Nutritional Assessment / Counseling / Intervention 0 X - Lower Extremity Assessment (monofilament, tuning fork, pulses) 1 5 []  - Peripheral Arterial Disease Assessment (using hand held doppler) 0 ASSESSMENTS - Ostomy and/or Continence Assessment and Care []  - Incontinence Assessment and Management 0 []  - Ostomy Care Assessment and Management (repouching, etc.) 0 PROCESS - Coordination of Care X - Simple Patient / Family Education for ongoing care 1 15 []  - Complex (extensive) Patient / Family Education for ongoing care 0 []  - Staff obtains Programmer, systems, Records, Test Results / Process Orders 0 []  - Staff telephones HHA, Nursing Homes / Clarify orders / etc 0 []  - Routine Transfer to another Facility (non-emergent condition) 0 Katherine [name], Katherine I. ([id number]) []  - Routine Hospital Admission (non-emergent condition) 0 []  - New Admissions / Biomedical engineer / Ordering NPWT, Apligraf, etc. 0 []  - Emergency Hospital Admission (emergent condition) 0 []  - Simple Discharge Coordination 0 []  - Complex (extensive) Discharge Coordination 0 PROCESS - Special Needs []  - Pediatric / Minor Patient Management 0 []  - Isolation Patient Management 0 []  - Hearing / Language / Visual special needs 0 []  - Assessment of Community assistance (transportation, D/C planning, etc.) 0 []  - Additional assistance / Altered mentation 0 []  - Support Surface(s) Assessment (bed, cushion, seat, etc.) 0 INTERVENTIONS - Wound Cleansing /  Measurement X - Simple Wound Cleansing - one wound 1 5 []  - Complex Wound Cleansing - multiple wounds 0 X -  Wound Imaging (photographs - any number of wounds) 1 5 []  - Wound Tracing (instead of photographs) 0 X - Simple Wound Measurement - one wound 1 5 []  - Complex Wound Measurement - multiple wounds 0 INTERVENTIONS - Wound Dressings X - Small Wound Dressing one or multiple wounds 1 10 []  - Medium Wound Dressing one or multiple wounds 0 []  - Large Wound Dressing one or multiple wounds 0 []  - Application of Medications - topical 0 []  - Application of Medications - injection 0 INTERVENTIONS - Miscellaneous []  - External ear exam 0 Katherine [name], Katherine I. ([id number]) []  - Specimen Collection (cultures, biopsies, blood, body fluids, etc.) 0 []  - Specimen(s) / Culture(s) sent or taken to Lab for analysis 0 []  - Patient Transfer (multiple staff / Harrel Lemon Lift / Similar devices) 0 []  - Simple Staple / Suture removal (25 or less) 0 []  - Complex Staple / Suture removal (26 or more) 0 []  - Hypo / Hyperglycemic Management (close monitor of Blood Glucose) 0 []  - Ankle / Brachial Index (ABI) - do not check if billed separately 0 X - Vital Signs 1 5 Has the patient been seen at the hospital within the last three years: Yes Total Score: 70 Level Of Care: New/Established - Level 2 Electronic Signature(s) Signed: 12/17/2016 4:33:31 PM By: Regan Lemming BSN, RN Entered By: Regan Lemming on 12/17/2016 16:19:18 [name] ([id number]) -------------------------------------------------------------------------------- Encounter Discharge Information Details Patient Name: Katherine Juba I. Date of Service: 12/17/2016 2:45 PM Medical Record Number: [id number] Patient Account Number: [id number] Date of Birth/Sex: 27-Aug-1929 (83 y.o. Female) Treating RN: Baruch Gouty, RN, BSN, Velva Harman Primary Care Keymari Sato: Thersa Salt Other Clinician: Referring Ryu Cerreta: Thersa Salt Treating Chester Romero/Extender: Frann Rider in Treatment:  10 Encounter Discharge Information Items Discharge Pain Level: 0 Discharge Condition: Stable Ambulatory Status: Walker Discharge Destination: Home Transportation: Private Auto Accompanied By: niece Schedule Follow-up Appointment: No Medication Reconciliation completed No and provided to Patient/Care Maleiya Pergola: Provided on Clinical Summary of Care: 12/17/2016 Form Type Recipient Paper Patient DK Electronic Signature(s) Signed: 12/17/2016 4:33:31 PM By: Regan Lemming BSN, RN Previous Signature: 12/17/2016 3:36:13 PM Version By: Ruthine Dose Entered By: Regan Lemming on 12/17/2016 16:20:20 [name] ([id number]) -------------------------------------------------------------------------------- Lower Extremity Assessment Details Patient Name: Katherine Juba I. Date of Service: 12/17/2016 2:45 PM Medical Record Number: [id number] Patient Account Number: [id number] Date of Birth/Sex: 1930/06/11 (83 y.o. Female) Treating RN: Afful, RN, BSN, Velva Harman Primary Care Jode Lippe: Thersa Salt Other Clinician: Referring Kimala Horne: Thersa Salt Treating Carlisa Eble/Extender: Frann Rider in Treatment: 10 Edema Assessment Assessed: [Left: No] [Right: No] E[Left: dema] [Right: :] Calf Left: Right: Point of Measurement: 32 cm From Medial Instep cm 32.6 cm Ankle Left: Right: Point of Measurement: 7 cm From Medial Instep cm 20.1 cm Vascular Assessment Claudication: Claudication Assessment [Right:None] Pulses: Dorsalis Pedis Palpable: [Right:Yes] Posterior Tibial Extremity colors, hair growth, and conditions: Extremity Color: [Right:Mottled] Hair Growth on Extremity: [Right:No] Temperature of Extremity: [Right:Warm] Capillary Refill: [Right:< 3 seconds] Toe Nail Assessment Left: Right: Thick: Yes Discolored: Yes Deformed: No Improper Length and Hygiene: No Electronic Signature(s) Signed: 12/17/2016 3:20:39 PM By: Regan Lemming BSN, RN Entered By: Regan Lemming on 12/17/2016 15:20:39 [name]  ([id number]) Katherine [name], Katherine [name] Kitchen ([id number]) -------------------------------------------------------------------------------- Multi Wound Chart Details Patient Name: Katherine Juba I. Date of Service: 12/17/2016 2:45 PM Medical Record Number: [id number] Patient Account Number: [id number] Date of Birth/Sex: [birth year] (83 y.o. Female) Treating RN: Afful, RN, BSN, [employer] Primary Care Cleofas Hudgins: Thersa Salt Other Clinician: Referring Elya Tarquinio: Thersa Salt Treating Paitynn Mikus/Extender:  Britto, Errol Weeks in Treatment: 10 Vital Signs Height(in): 61 Pulse(bpm): 59 Weight(lbs): 140 Blood Pressure 145/54 (mmHg): Body Mass Index(BMI): 26 Temperature(F): 98.5 Respiratory Rate 16 (breaths/min): Photos: [1:No Photos] [N/A:N/A] Wound Location: [1:Right Lower Leg - Lateral] [N/A:N/A] Wounding Event: [1:Surgical Injury] [N/A:N/A] Primary Etiology: [1:Open Surgical Wound] [N/A:N/A] Comorbid History: [1:Congestive Heart Failure, Hypertension, Myocardial Infarction, Osteoarthritis, Neuropathy] [N/A:N/A] Date Acquired: [1:08/24/2016] [N/A:N/A] Weeks of Treatment: [1:10] [N/A:N/A] Wound Status: [1:Open] [N/A:N/A] Measurements L x W x D 1x0.5x0.2 [N/A:N/A] (cm) Area (cm) : [1:0.393] [N/A:N/A] Volume (cm) : [1:0.079] [N/A:N/A] % Reduction in Area: [1:79.20%] [N/A:N/A] % Reduction in Volume: 89.50% [N/A:N/A] Classification: [1:Partial Thickness] [N/A:N/A] Exudate Amount: [1:Small] [N/A:N/A] Exudate Type: [1:Serous] [N/A:N/A] Exudate Color: [1:amber] [N/A:N/A] Wound Margin: [1:Distinct, outline attached] [N/A:N/A] Granulation Amount: [1:None Present (0%)] [N/A:N/A] Necrotic Amount: [1:Large (67-100%)] [N/A:N/A] Exposed Structures: [1:Fat Layer (Subcutaneous Tissue) Exposed: Yes] [N/A:N/A] Epithelialization: [1:Medium (34-66%)] [N/A:N/A] Periwound Skin Texture: Excoriation: No [1:Induration: No Callus: No] [N/A:N/A] Crepitus: No Rash: No Scarring: No Periwound Skin Maceration: No N/A  N/A Moisture: Dry/Scaly: No Periwound Skin Color: Atrophie Blanche: No N/A N/A Cyanosis: No Ecchymosis: No Erythema: No Hemosiderin Staining: No Mottled: No Pallor: No Rubor: No Temperature: No Abnormality N/A N/A Tenderness on No N/A N/A Palpation: Wound Preparation: Ulcer Cleansing: N/A N/A Rinsed/Irrigated with Saline, Other: surg scrub and water Topical Anesthetic Applied: Other: lidocaine 4% Treatment Notes Electronic Signature(s) Signed: 12/17/2016 4:11:37 PM By: Christin Fudge MD, FACS Previous Signature: 12/17/2016 3:21:30 PM Version By: Regan Lemming BSN, RN Entered By: Christin Fudge on 12/17/2016 16:11:37 [name] ([id number]) -------------------------------------------------------------------------------- Multi-Disciplinary Care Plan Details Patient Name: Katherine Juba I. Date of Service: 12/17/2016 2:45 PM Medical Record Number: [id number] Patient Account Number: [id number] Date of Birth/Sex: [birth year] (83 y.o. Female) Treating RN: Afful, RN, BSN, [employer] Primary Care Breleigh Carpino: Thersa Salt Other Clinician: Referring Daemien Fronczak: Thersa Salt Treating Jaunice Mirza/Extender: Frann Rider in Treatment: 10 Active Inactive ` Abuse / Safety / Falls / Self Care Management Nursing Diagnoses: Potential for falls Goals: Patient will remain injury free Date Initiated: 10/07/2016 Target Resolution Date: 12/18/2016 Goal Status: Active Interventions: Assess fall risk on admission and as needed Assess impairment of mobility on admission and as needed per policy Notes: ` Nutrition Nursing Diagnoses: Imbalanced nutrition Goals: Patient/caregiver agrees to and verbalizes understanding of need to use nutritional supplements and/or vitamins as prescribed Date Initiated: 10/07/2016 Target Resolution Date: 12/18/2016 Goal Status: Active Interventions: Assess patient nutrition upon admission and as needed per policy Notes: ` Orientation to the Wound Care Program Nursing  Diagnoses: MARIAME, RYBOLT ([id number]) Knowledge deficit related to the wound healing center program Goals: Patient/caregiver will verbalize understanding of the [facility] Date Initiated: 10/07/2016 Target Resolution Date: 10/23/2016 Goal Status: Active Interventions: Provide education on orientation to the wound center Notes: ` Pain, Acute or Chronic Nursing Diagnoses: Pain, acute or chronic: actual or potential Potential alteration in comfort, pain Goals: Patient/caregiver will verbalize adequate pain control between visits Date Initiated: 10/07/2016 Target Resolution Date: 12/18/2016 Goal Status: Active Interventions: Assess comfort goal upon admission Complete pain assessment as per visit requirements Notes: ` Wound/Skin Impairment Nursing Diagnoses: Impaired tissue integrity Knowledge deficit related to smoking impact on wound healing Knowledge deficit related to ulceration/compromised skin integrity Goals: Ulcer/skin breakdown will have a volume reduction of 80% by week 12 Date Initiated: 10/07/2016 Target Resolution Date: 12/11/2016 Goal Status: Active Interventions: Assess patient/caregiver ability to perform ulcer/skin care regimen upon admission and as needed Assess ulceration(s) every visit Katherine [name], Katherine I. ([id number]) Notes: Electronic Signature(s)  Signed: 12/17/2016 4:33:31 PM By: Regan Lemming BSN, RN Previous Signature: 12/17/2016 3:20:45 PM Version By: Regan Lemming BSN, RN Entered By: Regan Lemming on 12/17/2016 15:29:15 Katherine Juba [name] Kitchen ([id number]) -------------------------------------------------------------------------------- Pain Assessment Details Patient Name: Katherine Juba I. Date of Service: 12/17/2016 2:45 PM Medical Record Number: [id number] Patient Account Number: [id number] Date of Birth/Sex: [birth year] (83 y.o. Female) Treating RN: Baruch Gouty, RN, BSN, Velva Harman Primary Care Rotha Cassels: Thersa Salt Other Clinician: Referring Birdena Kingma: Thersa Salt Treating Kady Toothaker/Extender: Frann Rider in Treatment: 10 Active Problems Location of Pain Severity and Description of Pain Patient Has Paino No Site Locations With Dressing Change: No Pain Management and Medication Current Pain Management: Electronic Signature(s) Signed: 12/17/2016 3:16:38 PM By: Regan Lemming BSN, RN Entered By: Regan Lemming on 12/17/2016 15:16:38 [name] ([id number]) -------------------------------------------------------------------------------- Patient/Caregiver Education Details Patient Name: Katherine Juba I. Date of Service: 12/17/2016 2:45 PM Medical Record Number: [id number] Patient Account Number: [id number] Date of Birth/Gender: [birth year] (83 y.o. Female) Treating RN: Baruch Gouty, RN, BSN, Velva Harman Primary Care Physician: Thersa Salt Other Clinician: Referring Physician: Thersa Salt Treating Physician/Extender: Frann Rider in Treatment: 10 Education Assessment Education Provided To: Patient Education Topics Provided Welcome To [facility]: Methods: Explain/Verbal Responses: State content correctly Wound/Skin Impairment: Methods: Explain/Verbal Responses: State content correctly Electronic Signature(s) Signed: 12/17/2016 4:33:31 PM By: Regan Lemming BSN, RN Entered By: Regan Lemming on 12/17/2016 16:20:32 [name] ([id number]) -------------------------------------------------------------------------------- Wound Assessment Details Patient Name: Katherine Juba I. Date of Service: 12/17/2016 2:45 PM Medical Record Number: [id number] Patient Account Number: [id number] Date of Birth/Sex: [birth year] (83 y.o. Female) Treating RN: Afful, RN, BSN, Administrator, sports Primary Care Annita Ratliff: Thersa Salt Other Clinician: Referring Selim Durden: Thersa Salt Treating Sheza Strickland/Extender: Melburn Hake, HOYT Weeks in Treatment: 10 Wound Status Wound Number: 1 Primary Open Surgical Wound Etiology: Wound Location: Right Lower Leg - Lateral Wound Open Wounding Event:  Surgical Injury Status: Date Acquired: 08/24/2016 Comorbid Congestive Heart Failure, Weeks Of Treatment: 10 History: Hypertension, Myocardial Infarction, Clustered Wound: No Osteoarthritis, Neuropathy Photos Photo Uploaded By: Regan Lemming on 12/17/2016 16:31:03 Wound Measurements Length: (cm) 1 Width: (cm) 0.5 Depth: (cm) 0.2 Area: (cm) 0.393 Volume: (cm) 0.079 % Reduction in Area: 79.2% % Reduction in Volume: 89.5% Epithelialization: Medium (34-66%) Tunneling: No Undermining: No Wound Description Classification: Partial Thickness Foul Odor Aft Wound Margin: Distinct, outline attached Slough/Fibrin Exudate Amount: Small Exudate Type: Serous Exudate Color: amber er Cleansing: No o Yes Wound Bed Granulation Amount: None Present (0%) Exposed Structure Necrotic Amount: Large (67-100%) Fat Layer (Subcutaneous Tissue) Exposed: Yes Necrotic Quality: Adherent Slough Katherine [name], Katherine I. ([id number]) Periwound Skin Texture Texture Color No Abnormalities Noted: No No Abnormalities Noted: No Callus: No Atrophie Blanche: No Crepitus: No Cyanosis: No Excoriation: No Ecchymosis: No Induration: No Erythema: No Rash: No Hemosiderin Staining: No Scarring: No Mottled: No Pallor: No Moisture Rubor: No No Abnormalities Noted: No Dry / Scaly: No Temperature / Pain Maceration: No Temperature: No Abnormality Wound Preparation Ulcer Cleansing: Rinsed/Irrigated with Saline, Other: surg scrub and water, Topical Anesthetic Applied: Other: lidocaine 4%, Treatment Notes Wound #1 (Right, Lateral Lower Leg) 1. Cleansed with: Clean wound with Normal Saline 4. Dressing Applied: Medihoney Gel 5. Secondary Dressing Applied [place] Signature(s) Signed: 12/17/2016 4:33:31 PM By: Regan Lemming BSN, RN Entered By: Regan Lemming on 12/17/2016 [place]art, Theresa. ([id number]) -------------------------------------------------------------------------------- Vitals  Details Patient Name: Katherine Juba I. Date of Service: 12/17/2016 2:45 PM Medical Record Number: [id number] Patient Account Number: [id number] Date of Birth/Sex: August 08, 1930 (83 y.o. Female)  Treating RN: Afful, RN, BSN, [place] Primary Care Argie Lober: Thersa Salt Other Clinician: Referring Alzada Brazee: Thersa Salt Treating Rishawn Walck/Extender: Frann Rider in Treatment: 10 Vital Signs Time Taken: 15:00 Temperature (F): 98.5 Height (in): 61 Pulse (bpm): 59 Weight (lbs): 140 Respiratory Rate (breaths/min): 16 Body Mass Index (BMI): 26.4 Blood Pressure (mmHg): 145/54 Reference Range: 80 - 120 mg / dl Electronic Signature(s) Signed: 12/17/2016 3:20:30 PM By: Regan Lemming BSN, RN Entered By: Regan Lemming on 12/17/2016 15:20:30

## 2016-12-20 NOTE — Telephone Encounter (Signed)
Patient requesting a procedure, has not had any relief from last procedure. Appointment made for May 21 at 1:15p.m. Pre-procedure instructions given to family member.

## 2016-12-23 ENCOUNTER — Telehealth: Payer: Self-pay | Admitting: *Deleted

## 2016-12-23 ENCOUNTER — Ambulatory Visit (INDEPENDENT_AMBULATORY_CARE_PROVIDER_SITE_OTHER): Payer: Medicare HMO | Admitting: Family Medicine

## 2016-12-23 ENCOUNTER — Encounter: Payer: Self-pay | Admitting: Family Medicine

## 2016-12-23 DIAGNOSIS — I10 Essential (primary) hypertension: Secondary | ICD-10-CM | POA: Diagnosis not present

## 2016-12-23 DIAGNOSIS — I2510 Atherosclerotic heart disease of native coronary artery without angina pectoris: Secondary | ICD-10-CM

## 2016-12-23 DIAGNOSIS — I255 Ischemic cardiomyopathy: Secondary | ICD-10-CM | POA: Diagnosis not present

## 2016-12-23 DIAGNOSIS — I5042 Chronic combined systolic (congestive) and diastolic (congestive) heart failure: Secondary | ICD-10-CM | POA: Diagnosis not present

## 2016-12-23 DIAGNOSIS — E785 Hyperlipidemia, unspecified: Secondary | ICD-10-CM | POA: Diagnosis not present

## 2016-12-23 DIAGNOSIS — J449 Chronic obstructive pulmonary disease, unspecified: Secondary | ICD-10-CM | POA: Diagnosis not present

## 2016-12-23 DIAGNOSIS — M25511 Pain in right shoulder: Secondary | ICD-10-CM | POA: Diagnosis not present

## 2016-12-23 DIAGNOSIS — R69 Illness, unspecified: Secondary | ICD-10-CM | POA: Diagnosis not present

## 2016-12-23 DIAGNOSIS — M1990 Unspecified osteoarthritis, unspecified site: Secondary | ICD-10-CM | POA: Diagnosis not present

## 2016-12-23 DIAGNOSIS — S82851D Displaced trimalleolar fracture of right lower leg, subsequent encounter for closed fracture with routine healing: Secondary | ICD-10-CM | POA: Diagnosis not present

## 2016-12-23 DIAGNOSIS — I110 Hypertensive heart disease with heart failure: Secondary | ICD-10-CM | POA: Diagnosis not present

## 2016-12-23 NOTE — Telephone Encounter (Signed)
Ivor Messier from [facility] stated that pt had a blood pressure of 78/50, she a recheck in 30 minutes and a reading of 92/48. Patient has a office visit scheduled for today.  [employer] [phone]

## 2016-12-23 NOTE — Assessment & Plan Note (Signed)
BP well controlled today. Patient to continue her current medications: Lasix, carvedilol, amlodipine, lisinopril.

## 2016-12-23 NOTE — Telephone Encounter (Signed)
FYI

## 2016-12-23 NOTE — Assessment & Plan Note (Signed)
Stable at this time. Patient to continue her current medications. Nitroglycerin as needed. I advised her to be cautious with exertion given her cardiac history.

## 2016-12-23 NOTE — Progress Notes (Signed)
Subjective:  Patient ID: Katherine [name], female    DOB: [birth year]  Age: 83 y.o. MRN: [id number]  CC: Follow up  HPI:  83 year old female with an extensive past medical history including ischemic cardiomyopathy, combined CHF, COPD, hypertension, hyponatremia, occlusive CAD, and pulmonary hypertension presents for follow-up.  At her last visit, patient was experiencing severely elevated blood pressures. Etiology for elevated BP was unclear. She also been complaining of diaphoresis as well as feeling "that my heart just doesn't feel right". She had taken some nitroglycerin with resolution. I added Norvasc at that time after discussing with cardiology.  Patient resents today for follow-up. BP is well controlled. She endorses compliance with her medications. She has had some diaphoresis with exertion which is likely from angina. She states that she is feeling well. She continues to be very active (likely too active). No current chest pain, SOB.  Social Hx Social History   Social History  . Marital status: Single    Spouse name: N/A  . Number of children: N/A  . Years of education: N/A   Social History Main Topics  . Smoking status: Never Smoker  . Smokeless tobacco: Never Used  . Alcohol use No  . Drug use: No  . Sexual activity: No   Other Topics Concern  . None   Social History Narrative   Patient lives in [place] but visits her family in [place] frequently. She is very active and walks often.    Review of Systems  Constitutional: Positive for diaphoresis.  Cardiovascular: Negative for chest pain.   Objective:  BP 110/68 (BP Location: Left Arm, Patient Position: Sitting, Cuff Size: Normal)   Pulse (!) 53   Temp 97.7 F (36.5 C) (Oral)   Resp 16   Ht 5\' 1"  (1.549 m)   Wt 147 lb 3.2 oz (66.8 kg)   SpO2 96%   BMI 27.81 kg/m   BP/Weight 12/23/2016 9/74/1638 11/18/3644  Systolic BP 803 212 248  Diastolic BP 68 94 77  Wt. (Lbs) 147.2 146.38 120  BMI 27.81 27.66 22.67     Physical Exam  Constitutional: She is oriented to person, place, and time. She appears well-developed. No distress.  Cardiovascular: Regular rhythm.   Bradycardia.  Pulmonary/Chest: Effort normal and breath sounds normal. She has no wheezes. She has no rales.  Neurological: She is alert and oriented to person, place, and time.  Psychiatric: She has a normal mood and affect.  Vitals reviewed.   Lab Results  Component Value Date   WBC 12.0 (H) 10/28/2016   HGB 12.2 10/28/2016   HCT 36.8 10/28/2016   PLT 163 10/28/2016   GLUCOSE 117 (H) 10/28/2016   CHOL 114 12/16/2015   TRIG 126 12/16/2015   HDL 41 12/16/2015   LDLCALC 48 12/16/2015   ALT 25 10/28/2016   AST 46 (H) 10/28/2016   NA 138 10/28/2016   K 3.9 10/28/2016   CL 104 10/28/2016   CREATININE 0.82 10/28/2016   BUN 11 10/28/2016   CO2 28 10/28/2016   TSH 0.345 (L) 10/30/2015   INR 1.12 10/30/2015   HGBA1C 6.0 (H) 10/30/2015    Assessment & Plan:   Problem List Items Addressed This Visit    Essential hypertension    BP well controlled today. Patient to continue her current medications: Lasix, carvedilol, amlodipine, lisinopril.      Occlusive coronary artery disease requiring drug therapy (Chronic)    Stable at this time. Patient to continue her current medications. Nitroglycerin as  needed. I advised her to be cautious with exertion given her cardiac history.         Meds ordered this encounter  Medications  . cholecalciferol (VITAMIN D) 1000 units tablet    Sig: Take 1,000 Units by mouth daily.   Follow-up: 6 months  [place] DO [facility]

## 2016-12-24 ENCOUNTER — Ambulatory Visit: Payer: Medicare HMO | Admitting: Anesthesiology

## 2016-12-24 DIAGNOSIS — M25511 Pain in right shoulder: Secondary | ICD-10-CM | POA: Diagnosis not present

## 2016-12-24 DIAGNOSIS — I2510 Atherosclerotic heart disease of native coronary artery without angina pectoris: Secondary | ICD-10-CM | POA: Diagnosis not present

## 2016-12-24 DIAGNOSIS — I5042 Chronic combined systolic (congestive) and diastolic (congestive) heart failure: Secondary | ICD-10-CM | POA: Diagnosis not present

## 2016-12-24 DIAGNOSIS — S82851D Displaced trimalleolar fracture of right lower leg, subsequent encounter for closed fracture with routine healing: Secondary | ICD-10-CM | POA: Diagnosis not present

## 2016-12-24 DIAGNOSIS — R69 Illness, unspecified: Secondary | ICD-10-CM | POA: Diagnosis not present

## 2016-12-24 DIAGNOSIS — I110 Hypertensive heart disease with heart failure: Secondary | ICD-10-CM | POA: Diagnosis not present

## 2016-12-24 DIAGNOSIS — E785 Hyperlipidemia, unspecified: Secondary | ICD-10-CM | POA: Diagnosis not present

## 2016-12-24 DIAGNOSIS — J449 Chronic obstructive pulmonary disease, unspecified: Secondary | ICD-10-CM | POA: Diagnosis not present

## 2016-12-24 DIAGNOSIS — M1990 Unspecified osteoarthritis, unspecified site: Secondary | ICD-10-CM | POA: Diagnosis not present

## 2016-12-24 DIAGNOSIS — I255 Ischemic cardiomyopathy: Secondary | ICD-10-CM | POA: Diagnosis not present

## 2016-12-26 DIAGNOSIS — S82851D Displaced trimalleolar fracture of right lower leg, subsequent encounter for closed fracture with routine healing: Secondary | ICD-10-CM | POA: Diagnosis not present

## 2016-12-28 DIAGNOSIS — I110 Hypertensive heart disease with heart failure: Secondary | ICD-10-CM | POA: Diagnosis not present

## 2016-12-28 DIAGNOSIS — S82851D Displaced trimalleolar fracture of right lower leg, subsequent encounter for closed fracture with routine healing: Secondary | ICD-10-CM | POA: Diagnosis not present

## 2016-12-28 DIAGNOSIS — R69 Illness, unspecified: Secondary | ICD-10-CM | POA: Diagnosis not present

## 2016-12-28 DIAGNOSIS — M1990 Unspecified osteoarthritis, unspecified site: Secondary | ICD-10-CM | POA: Diagnosis not present

## 2016-12-28 DIAGNOSIS — J449 Chronic obstructive pulmonary disease, unspecified: Secondary | ICD-10-CM | POA: Diagnosis not present

## 2016-12-28 DIAGNOSIS — I2510 Atherosclerotic heart disease of native coronary artery without angina pectoris: Secondary | ICD-10-CM | POA: Diagnosis not present

## 2016-12-28 DIAGNOSIS — I5042 Chronic combined systolic (congestive) and diastolic (congestive) heart failure: Secondary | ICD-10-CM | POA: Diagnosis not present

## 2016-12-28 DIAGNOSIS — E785 Hyperlipidemia, unspecified: Secondary | ICD-10-CM | POA: Diagnosis not present

## 2016-12-28 DIAGNOSIS — I255 Ischemic cardiomyopathy: Secondary | ICD-10-CM | POA: Diagnosis not present

## 2016-12-28 DIAGNOSIS — M25511 Pain in right shoulder: Secondary | ICD-10-CM | POA: Diagnosis not present

## 2017-01-01 DIAGNOSIS — I5042 Chronic combined systolic (congestive) and diastolic (congestive) heart failure: Secondary | ICD-10-CM | POA: Diagnosis not present

## 2017-01-01 DIAGNOSIS — I110 Hypertensive heart disease with heart failure: Secondary | ICD-10-CM | POA: Diagnosis not present

## 2017-01-01 DIAGNOSIS — J449 Chronic obstructive pulmonary disease, unspecified: Secondary | ICD-10-CM | POA: Diagnosis not present

## 2017-01-01 DIAGNOSIS — I2510 Atherosclerotic heart disease of native coronary artery without angina pectoris: Secondary | ICD-10-CM | POA: Diagnosis not present

## 2017-01-01 DIAGNOSIS — E785 Hyperlipidemia, unspecified: Secondary | ICD-10-CM | POA: Diagnosis not present

## 2017-01-01 DIAGNOSIS — S82851D Displaced trimalleolar fracture of right lower leg, subsequent encounter for closed fracture with routine healing: Secondary | ICD-10-CM | POA: Diagnosis not present

## 2017-01-01 DIAGNOSIS — R69 Illness, unspecified: Secondary | ICD-10-CM | POA: Diagnosis not present

## 2017-01-01 DIAGNOSIS — I255 Ischemic cardiomyopathy: Secondary | ICD-10-CM | POA: Diagnosis not present

## 2017-01-01 DIAGNOSIS — M25511 Pain in right shoulder: Secondary | ICD-10-CM | POA: Diagnosis not present

## 2017-01-01 DIAGNOSIS — M1990 Unspecified osteoarthritis, unspecified site: Secondary | ICD-10-CM | POA: Diagnosis not present

## 2017-01-03 ENCOUNTER — Ambulatory Visit (HOSPITAL_BASED_OUTPATIENT_CLINIC_OR_DEPARTMENT_OTHER): Payer: Medicare HMO | Admitting: Anesthesiology

## 2017-01-03 ENCOUNTER — Encounter: Payer: Self-pay | Admitting: Anesthesiology

## 2017-01-03 ENCOUNTER — Ambulatory Visit
Admission: RE | Admit: 2017-01-03 | Discharge: 2017-01-03 | Disposition: A | Discharge status: Home / Self Care | Payer: Medicare HMO | Source: Ambulatory Visit | Attending: Anesthesiology | Admitting: Anesthesiology

## 2017-01-03 ENCOUNTER — Other Ambulatory Visit: Payer: Self-pay | Admitting: Anesthesiology

## 2017-01-03 VITALS — BP 195/82 | HR 54 | Temp 96.9°F | Resp 17 | Ht 61.0 in | Wt 147.0 lb

## 2017-01-03 DIAGNOSIS — E785 Hyperlipidemia, unspecified: Secondary | ICD-10-CM | POA: Diagnosis not present

## 2017-01-03 DIAGNOSIS — I959 Hypotension, unspecified: Secondary | ICD-10-CM | POA: Diagnosis not present

## 2017-01-03 DIAGNOSIS — Z96641 Presence of right artificial hip joint: Secondary | ICD-10-CM | POA: Diagnosis not present

## 2017-01-03 DIAGNOSIS — M79662 Pain in left lower leg: Secondary | ICD-10-CM | POA: Insufficient documentation

## 2017-01-03 DIAGNOSIS — I255 Ischemic cardiomyopathy: Secondary | ICD-10-CM | POA: Diagnosis not present

## 2017-01-03 DIAGNOSIS — Z9889 Other specified postprocedural states: Secondary | ICD-10-CM | POA: Diagnosis not present

## 2017-01-03 DIAGNOSIS — R52 Pain, unspecified: Secondary | ICD-10-CM

## 2017-01-03 DIAGNOSIS — Z803 Family history of malignant neoplasm of breast: Secondary | ICD-10-CM | POA: Insufficient documentation

## 2017-01-03 DIAGNOSIS — Z89022 Acquired absence of left finger(s): Secondary | ICD-10-CM | POA: Diagnosis not present

## 2017-01-03 DIAGNOSIS — J449 Chronic obstructive pulmonary disease, unspecified: Secondary | ICD-10-CM | POA: Insufficient documentation

## 2017-01-03 DIAGNOSIS — I2510 Atherosclerotic heart disease of native coronary artery without angina pectoris: Secondary | ICD-10-CM | POA: Insufficient documentation

## 2017-01-03 DIAGNOSIS — Z79899 Other long term (current) drug therapy: Secondary | ICD-10-CM | POA: Diagnosis not present

## 2017-01-03 DIAGNOSIS — I252 Old myocardial infarction: Secondary | ICD-10-CM | POA: Insufficient documentation

## 2017-01-03 DIAGNOSIS — I5042 Chronic combined systolic (congestive) and diastolic (congestive) heart failure: Secondary | ICD-10-CM | POA: Insufficient documentation

## 2017-01-03 DIAGNOSIS — M48062 Spinal stenosis, lumbar region with neurogenic claudication: Secondary | ICD-10-CM | POA: Diagnosis not present

## 2017-01-03 DIAGNOSIS — I110 Hypertensive heart disease with heart failure: Secondary | ICD-10-CM | POA: Insufficient documentation

## 2017-01-03 DIAGNOSIS — Z90710 Acquired absence of both cervix and uterus: Secondary | ICD-10-CM | POA: Insufficient documentation

## 2017-01-03 DIAGNOSIS — M549 Dorsalgia, unspecified: Secondary | ICD-10-CM | POA: Diagnosis not present

## 2017-01-03 DIAGNOSIS — Z8249 Family history of ischemic heart disease and other diseases of the circulatory system: Secondary | ICD-10-CM | POA: Insufficient documentation

## 2017-01-03 DIAGNOSIS — Z823 Family history of stroke: Secondary | ICD-10-CM | POA: Insufficient documentation

## 2017-01-03 DIAGNOSIS — K219 Gastro-esophageal reflux disease without esophagitis: Secondary | ICD-10-CM | POA: Insufficient documentation

## 2017-01-03 DIAGNOSIS — M5432 Sciatica, left side: Secondary | ICD-10-CM

## 2017-01-03 DIAGNOSIS — Z801 Family history of malignant neoplasm of trachea, bronchus and lung: Secondary | ICD-10-CM | POA: Diagnosis not present

## 2017-01-03 MED ORDER — IOPAMIDOL (ISOVUE-M 200) INJECTION 41%
INTRAMUSCULAR | Status: AC
Start: 2017-01-03 — End: 2017-01-03
  Filled 2017-01-03: qty 10

## 2017-01-03 MED ORDER — IOPAMIDOL (ISOVUE-M 200) INJECTION 41%
20.0000 mL | Freq: Once | INTRAMUSCULAR | Status: DC | PRN
  Administered 2017-01-03: 10 mL
  Filled 2017-01-03: qty 20

## 2017-01-03 MED ORDER — LIDOCAINE HCL (PF) 1 % IJ SOLN
5.0000 mL | Freq: Once | INTRAMUSCULAR | Status: AC
  Administered 2017-01-03: 5 mL via SUBCUTANEOUS
  Filled 2017-01-03: qty 5

## 2017-01-03 MED ORDER — LACTATED RINGERS IV SOLN
1000.0000 mL | INTRAVENOUS | Status: DC
  Administered 2017-01-03: 1000 mL via INTRAVENOUS

## 2017-01-03 MED ORDER — TRIAMCINOLONE ACETONIDE 40 MG/ML IJ SUSP
40.0000 mg | Freq: Once | INTRAMUSCULAR | Status: AC
  Administered 2017-01-03: 40 mg
  Filled 2017-01-03: qty 1

## 2017-01-03 MED ORDER — MIDAZOLAM HCL 5 MG/5ML IJ SOLN
5.0000 mg | Freq: Once | INTRAMUSCULAR | Status: AC
  Administered 2017-01-03: 1 mg via INTRAVENOUS
  Filled 2017-01-03: qty 5

## 2017-01-03 MED ORDER — HYDROCODONE-ACETAMINOPHEN 5-325 MG PO TABS
1.0000 | ORAL_TABLET | Freq: Four times a day (QID) | ORAL | 0 refills | Status: DC | PRN
Start: 2017-01-03 — End: 2017-01-03

## 2017-01-03 MED ORDER — SODIUM CHLORIDE 0.9% FLUSH
10.0000 mL | Freq: Once | INTRAVENOUS | Status: AC
  Administered 2017-01-03: 10 mL

## 2017-01-03 MED ORDER — ROPIVACAINE HCL 2 MG/ML IJ SOLN
10.0000 mL | Freq: Once | INTRAMUSCULAR | Status: AC
  Administered 2017-01-03: 10 mL via EPIDURAL
  Filled 2017-01-03: qty 10

## 2017-01-03 MED ORDER — HYDROCODONE-ACETAMINOPHEN 5-325 MG PO TABS: 1.0000 | ORAL_TABLET | Freq: Four times a day (QID) | ORAL | 0 refills | Status: AC | PRN

## 2017-01-03 NOTE — Progress Notes (Signed)
Nursing Pain Medication Assessment:  Safety precautions to be maintained throughout the outpatient stay will include: orient to surroundings, keep bed in low position, maintain call bell within reach at all times, provide assistance with transfer out of bed and ambulation.  Medication Inspection Compliance: Katherine [name] did not comply with our request to bring her pills to be counted. She was reminded that bringing the medication bottles, even when empty, is a requirement.  Medication: None brought in. Pill/Patch Count: None available to be counted. Bottle Appearance: No container available. Did not bring bottle(s) to appointment. Filled Date: N/A Last Medication intake:  Friday night- 3 days ago

## 2017-01-03 NOTE — Patient Instructions (Addendum)
Pain Management Discharge Instructions  General Discharge Instructions :  If you need to reach your doctor call: Monday-Friday 8:00 am - 4:00 pm at [phone] or toll free [phone].  After clinic hours [phone] to have operator reach doctor.  Bring all of your medication bottles to all your appointments in the pain clinic.  To cancel or reschedule your appointment with Pain Management please remember to call 24 hours in advance to avoid a fee.  Refer to the educational materials which you have been given on: General Risks, I had my Procedure. Discharge Instructions, Post Sedation.  Post Procedure Instructions:  The drugs you were given will stay in your system until tomorrow, so for the next 24 hours you should not drive, make any legal decisions or drink any alcoholic beverages.  You may eat anything you prefer, but it is better to start with liquids then soups and crackers, and gradually work up to solid foods.  Please notify your doctor immediately if you have any unusual bleeding, trouble breathing or pain that is not related to your normal pain.  Depending on the type of procedure that was done, some parts of your body may feel week and/or numb.  This usually clears up by tonight or the next day.  Walk with the use of an assistive device or accompanied by an adult for the 24 hours.  You may use ice on the affected area for the first 24 hours.  Put ice in a Ziploc bag and cover with a towel and place against area 15 minutes on 15 minutes off.  You may switch to heat after 24 hours.GENERAL RISKS AND COMPLICATIONS  What are the risk, side effects and possible complications? Generally speaking, most procedures are safe.  However, with any procedure there are risks, side effects, and the possibility of complications.  The risks and complications are dependent upon the sites that are lesioned, or the type of nerve block to be performed.  The closer the procedure is to the spine,  the more serious the risks are.  Great care is taken when placing the radio frequency needles, block needles or lesioning probes, but sometimes complications can occur. 1. Infection: Any time there is an injection through the skin, there is a risk of infection.  This is why sterile conditions are used for these blocks.  There are four possible types of infection. 1. Localized skin infection. 2. Central Nervous System Infection-This can be in the form of Meningitis, which can be deadly. 3. Epidural Infections-This can be in the form of an epidural abscess, which can cause pressure inside of the spine, causing compression of the spinal cord with subsequent paralysis. This would require an emergency surgery to decompress, and there are no guarantees that the patient would recover from the paralysis. 4. Discitis-This is an infection of the intervertebral discs.  It occurs in about 1% of discography procedures.  It is difficult to treat and it may lead to surgery.        2. Pain: the needles have to go through skin and soft tissues, will cause soreness.       3. Damage to internal structures:  The nerves to be lesioned may be near blood vessels or    other nerves which can be potentially damaged.       4. Bleeding: Bleeding is more common if the patient is taking blood thinners such as  aspirin, Coumadin, Ticiid, Plavix, etc., or if he/she have some genetic predisposition  such as   hemophilia. Bleeding into the spinal canal can cause compression of the spinal  cord with subsequent paralysis.  This would require an emergency surgery to  decompress and there are no guarantees that the patient would recover from the  paralysis.       5. Pneumothorax:  Puncturing of a lung is a possibility, every time a needle is introduced in  the area of the chest or upper back.  Pneumothorax refers to free air around the  collapsed lung(s), inside of the thoracic cavity (chest cavity).  Another two possible  complications  related to a similar event would include: Hemothorax and Chylothorax.   These are variations of the Pneumothorax, where instead of air around the collapsed  lung(s), you may have blood or chyle, respectively.       6. Spinal headaches: They may occur with any procedures in the area of the spine.       7. Persistent CSF (Cerebro-Spinal Fluid) leakage: This is a rare problem, but may occur  with prolonged intrathecal or epidural catheters either due to the formation of a fistulous  track or a dural tear.       8. Nerve damage: By working so close to the spinal cord, there is always a possibility of  nerve damage, which could be as serious as a permanent spinal cord injury with  paralysis.       9. Death:  Although rare, severe deadly allergic reactions known as "Anaphylactic  reaction" can occur to any of the medications used.      10. Worsening of the symptoms:  We can always make thing worse.  What are the chances of something like this happening? Chances of any of this occuring are extremely low.  By statistics, you have more of a chance of getting killed in a motor vehicle accident: while driving to the hospital than any of the above occurring .  Nevertheless, you should be aware that they are possibilities.  In general, it is similar to taking a shower.  Everybody knows that you can slip, hit your head and get killed.  Does that mean that you should not shower again?  Nevertheless always keep in mind that statistics do not mean anything if you happen to be on the wrong side of them.  Even if a procedure has a 1 (one) in a 1,000,000 (million) chance of going wrong, it you happen to be that one..Also, keep in mind that by statistics, you have more of a chance of having something go wrong when taking medications.  Who should not have this procedure? If you are on a blood thinning medication (e.g. Coumadin, Plavix, see list of "Blood Thinners"), or if you have an active infection going on, you should not  have the procedure.  If you are taking any blood thinners, please inform your physician.  How should I prepare for this procedure?  Do not eat or drink anything at least six hours prior to the procedure.  Bring a driver with you .  It cannot be a taxi.  Come accompanied by an adult that can drive you back, and that is strong enough to help you if your legs get weak or numb from the local anesthetic.  Take all of your medicines the morning of the procedure with just enough water to swallow them.  If you have diabetes, make sure that you are scheduled to have your procedure done first thing in the morning, whenever possible.  If you have diabetes,   take only half of your insulin dose and notify our nurse that you have done so as soon as you arrive at the clinic.  If you are diabetic, but only take blood sugar pills (oral hypoglycemic), then do not take them on the morning of your procedure.  You may take them after you have had the procedure.  Do not take aspirin or any aspirin-containing medications, at least eleven (11) days prior to the procedure.  They may prolong bleeding.  Wear loose fitting clothing that may be easy to take off and that you would not mind if it got stained with Betadine or blood.  Do not wear any jewelry or perfume  Remove any nail coloring.  It will interfere with some of our monitoring equipment.  NOTE: Remember that this is not meant to be interpreted as a complete list of all possible complications.  Unforeseen problems may occur.  BLOOD THINNERS The following drugs contain aspirin or other products, which can cause increased bleeding during surgery and should not be taken for 2 weeks prior to and 1 week after surgery.  If you should need take something for relief of minor pain, you may take acetaminophen which is found in Tylenol,m Datril, Anacin-3 and Panadol. It is not blood thinner. The products listed below are.  Do not take any of the products listed below  in addition to any listed on your instruction sheet.  A.P.C or A.P.C with Codeine Codeine Phosphate Capsules #3 Ibuprofen Ridaura  ABC compound Congesprin Imuran rimadil  Advil Cope Indocin Robaxisal  Alka-Seltzer Effervescent Pain Reliever and Antacid Coricidin or Coricidin-D  Indomethacin Rufen  Alka-Seltzer plus Cold Medicine Cosprin Ketoprofen S-A-C Tablets  Anacin Analgesic Tablets or Capsules Coumadin Korlgesic Salflex  Anacin Extra Strength Analgesic tablets or capsules CP-2 Tablets Lanoril Salicylate  Anaprox Cuprimine Capsules Levenox Salocol  Anexsia-D Dalteparin Magan Salsalate  Anodynos Darvon compound Magnesium Salicylate Sine-off  Ansaid Dasin Capsules Magsal Sodium Salicylate  Anturane Depen Capsules Marnal Soma  APF Arthritis pain formula Dewitt's Pills Measurin Stanback  Argesic Dia-Gesic Meclofenamic Sulfinpyrazone  Arthritis Bayer Timed Release Aspirin Diclofenac Meclomen Sulindac  Arthritis pain formula Anacin Dicumarol Medipren Supac  Analgesic (Safety coated) Arthralgen Diffunasal Mefanamic Suprofen  Arthritis Strength Bufferin Dihydrocodeine Mepro Compound Suprol  Arthropan liquid Dopirydamole Methcarbomol with Aspirin Synalgos  ASA tablets/Enseals Disalcid Micrainin Tagament  Ascriptin Doan's Midol Talwin  Ascriptin A/D Dolene Mobidin Tanderil  Ascriptin Extra Strength Dolobid Moblgesic Ticlid  Ascriptin with Codeine Doloprin or Doloprin with Codeine Momentum Tolectin  Asperbuf Duoprin Mono-gesic Trendar  Aspergum Duradyne Motrin or Motrin IB Triminicin  Aspirin plain, buffered or enteric coated Durasal Myochrisine Trigesic  Aspirin Suppositories Easprin Nalfon Trillsate  Aspirin with Codeine Ecotrin Regular or Extra Strength Naprosyn Uracel  Atromid-S Efficin Naproxen Ursinus  Auranofin Capsules Elmiron Neocylate Vanquish  Axotal Emagrin Norgesic Verin  Azathioprine Empirin or Empirin with Codeine Normiflo Vitamin E  Azolid Emprazil Nuprin Voltaren  Bayer  Aspirin plain, buffered or children's or timed BC Tablets or powders Encaprin Orgaran Warfarin Sodium  Buff-a-Comp Enoxaparin Orudis Zorpin  Buff-a-Comp with Codeine Equegesic Os-Cal-Gesic   Buffaprin Excedrin plain, buffered or Extra Strength Oxalid   Bufferin Arthritis Strength Feldene Oxphenbutazone   Bufferin plain or Extra Strength Feldene Capsules Oxycodone with Aspirin   Bufferin with Codeine Fenoprofen Fenoprofen Pabalate or Pabalate-SF   Buffets II Flogesic Panagesic   Buffinol plain or Extra Strength Florinal or Florinal with Codeine Panwarfarin   Buf-Tabs Flurbiprofen Penicillamine   Butalbital Compound Four-way cold tablets   Penicillin   Butazolidin Fragmin Pepto-Bismol   Carbenicillin Geminisyn Percodan   Carna Arthritis Reliever Geopen Persantine   Carprofen Gold's salt Persistin   Chloramphenicol Goody's Phenylbutazone   Chloromycetin Haltrain Piroxlcam   Clmetidine heparin Plaquenil   Cllnoril Hyco-pap Ponstel   Clofibrate Hydroxy chloroquine Propoxyphen         Before stopping any of these medications, be sure to consult the physician who ordered them.  Some, such as Coumadin (Warfarin) are ordered to prevent or treat serious conditions such as "deep thrombosis", "pumonary embolisms", and other heart problems.  The amount of time that you may need off of the medication may also vary with the medication and the reason for which you were taking it.  If you are taking any of these medications, please make sure you notify your pain physician before you undergo any procedures.         Epidural Steroid Injection An epidural steroid injection is a shot of steroid medicine and numbing medicine that is given into the space between the spinal cord and the bones in your back (epidural space). The shot helps relieve pain caused by an irritated or swollen nerve root. The amount of pain relief you get from the injection depends on what is causing the nerve to be swollen and  irritated, and how long your pain lasts. You are more likely to benefit from this injection if your pain is strong and comes on suddenly rather than if you have had pain for a long time. Tell a health care provider about:  Any allergies you have.  All medicines you are taking, including vitamins, herbs, eye drops, creams, and over-the-counter medicines.  Any problems you or family members have had with anesthetic medicines.  Any blood disorders you have.  Any surgeries you have had.  Any medical conditions you have.  Whether you are pregnant or may be pregnant. What are the risks? Generally, this is a safe procedure. However, problems may occur, including:  Headache.  Bleeding.  Infection.  Allergic reaction to medicines.  Damage to your nerves.  What happens before the procedure? Staying hydrated Follow instructions from your health care provider about hydration, which may include:  Up to 2 hours before the procedure - you may continue to drink clear liquids, such as water, clear fruit juice, black coffee, and plain tea.  Eating and drinking restrictions Follow instructions from your health care provider about eating and drinking, which may include:  8 hours before the procedure - stop eating heavy meals or foods such as meat, fried foods, or fatty foods.  6 hours before the procedure - stop eating light meals or foods, such as toast or cereal.  6 hours before the procedure - stop drinking milk or drinks that contain milk.  2 hours before the procedure - stop drinking clear liquids.  Medicine  You may be given medicines to lower anxiety.  Ask your health care provider about: ? Changing or stopping your regular medicines. This is especially important if you are taking diabetes medicines or blood thinners. ? Taking medicines such as aspirin and ibuprofen. These medicines can thin your blood. Do not take these medicines before your procedure if your health care  provider instructs you not to. General instructions  Plan to have someone take you home from the hospital or clinic. What happens during the procedure?  You may receive a medicine to help you relax (sedative).  You will be asked to lie on your abdomen.  The   The injection site will be cleaned.  A numbing medicine (local anesthetic) will be used to numb the injection site.  A needle will be inserted through your skin into the epidural space. You may feel some discomfort when this happens. An X-ray machine will be used to make sure the needle is put as close as possible to the affected nerve.  A steroid medicine and a local anesthetic will be injected into the epidural space.  The needle will be removed.  A bandage (dressing) will be put over the injection site. What happens after the procedure?  Your blood pressure, heart rate, breathing rate, and blood oxygen level will be monitored until the medicines you were given have worn off.  Your arm or leg may feel weak or numb for a few hours.  The injection site may feel sore.  Do not drive for 24 hours if you received a sedative. This information is not intended to replace advice given to you by your health care provider. Make sure you discuss any questions you have with your health care provider. Document Released: 11/09/2007 Document Revised: 01/14/2016 Document Reviewed: 11/18/2015 Elsevier Interactive Patient Education  2017 [employer]  Facet Joint Block The facet joints connect the bones of the spine (vertebrae). They make it possible for you to bend, twist, and make other movements with your spine. They also keep you from bending too far, twisting too far, and making other excessive movements. A facet joint block is a procedure where a numbing medicine (anesthetic) is injected into a facet joint. Often, a type of anti-inflammatory medicine called a steroid is also injected. A facet joint block may be done to diagnose neck or back pain. If  the pain gets better after a facet joint block, it means the pain is probably coming from the facet joint. If the pain does not get better, it means the pain is probably not coming from the facet joint. A facet joint block may also be done to relieve neck or back pain caused by an inflamed facet joint. A facet joint block is only done to relieve pain if the pain does not improve with other methods, such as medicine, exercise programs, and physical therapy. Tell a health care provider about:  Any allergies you have.  All medicines you are taking, including vitamins, herbs, eye drops, creams, and over-the-counter medicines.  Any problems you or family members have had with anesthetic medicines.  Any blood disorders you have.  Any surgeries you have had.  Any medical conditions you have.  Whether you are pregnant or may be pregnant. What are the risks? Generally, this is a safe procedure. However, problems may occur, including:  Bleeding.  Injury to a nerve near the injection site.  Pain at the injection site.  Weakness or numbness in areas controlled by nerves near the injection site.  Infection.  Temporary fluid retention.  Allergic reactions to medicines or dyes.  Injury to other structures or organs near the injection site. What happens before the procedure?  Follow instructions from your health care provider about eating or drinking restrictions.  Ask your health care provider about:  Changing or stopping your regular medicines. This is especially important if you are taking diabetes medicines or blood thinners.  Taking medicines such as aspirin and ibuprofen. These medicines can thin your blood. Do not take these medicines before your procedure if your health care provider instructs you not to.  Do not take any new dietary supplements or medicines without  asking your health care provider first.  Plan to have someone take you home after the procedure. What happens  during the procedure?  You may need to remove your clothing and dress in an open-back gown.  The procedure will be done while you are lying on an X-ray table. You will most likely be asked to lie on your stomach, but you may be asked to lie in a different position if an injection will be made in your neck.  Machines will be used to monitor your oxygen levels, heart rate, and blood pressure.  If an injection will be made in your neck, an IV tube will be inserted into one of your veins. Fluids and medicine will flow directly into your body through the IV tube.  The area over the facet joint where the injection will be made will be cleaned with soap. The surrounding skin will be covered with clean drapes.  A numbing medicine (local anesthetic) will be applied to your skin. Your skin may sting or burn for a moment.  A video X-ray machine (fluoroscopy) will be used to locate the joint. In some cases, a CT scan may be used.  A contrast dye may be injected into the facet joint area to help locate the joint.  When the joint is located, an anesthetic will be injected into the joint through the needle.  Your health care provider will ask you whether you feel pain relief. If you do feel relief, a steroid may be injected to provide pain relief for a longer period of time. If you do not feel relief or feel only partial relief, additional injections of an anesthetic may be made in other facet joints.  The needle will be removed.  Your skin will be cleaned.  A bandage (dressing) will be applied over each injection site. The procedure may vary among health care providers and hospitals. What happens after the procedure?  You will be observed for 15-30 minutes before being allowed to go home. This information is not intended to replace advice given to you by your health care provider. Make sure you discuss any questions you have with your health care provider. Document Released: 12/22/2006 Document  Revised: 09/03/2015 Document Reviewed: 04/28/2015 Elsevier Interactive Patient Education  2017 [place] After Refer to this sheet in the next few weeks. These instructions provide you with information about caring for yourself after your procedure. Your health care provider may also give you more specific instructions. Your treatment has been planned according to current medical practices, but problems sometimes occur. Call your health care provider if you have any problems or questions after your procedure. What can I expect after the procedure? After the procedure, it is common to have:  Some tenderness over the injection sites for 2 days after the procedure.  A temporary increase in blood sugar if you have diabetes. Follow these instructions at home:  Keep track of the amount of pain relief you feel and how long it lasts.  Take over-the-counter and prescription medicines only as told by your health care provider. You may need to limit pain medicine within the first 4-6 hours after the procedure.  Remove your bandages (dressings) the morning after the procedure.  For the first 24 hours after the procedure:  Do not apply heat near or over the injection sites.  Do not take a bath or soak in water, such as in a pool or lake.  Do not drive or operate  heavy machinery unless approved by your health care provider.  Avoid activities that require a lot of energy.  If the injection site is tender, try applying ice to the area. To do this:  Put ice in a plastic bag.  Place a towel between your skin and the bag.  Leave the ice on for 20 minutes, 2-3 times a day.  Keep all follow-up visits as told by your health care provider. This is important. Contact a health care provider if:  Fluid is coming from an injection site.  There is significant bleeding or swelling at an injection site.  You have diabetes and your blood sugar is above 180 mg/dL. Get help  right away if:  You have a fever.  You have worsening pain or swelling around an injection site.  There are red streaks around an injection site.  You develop severe pain that is not controlled by your medicines.  You develop a headache, stiff neck, nausea, or vomiting.  Your eyes become very sensitive to light.  You have weakness, paralysis, or tingling in your arms or legs that was not present before the procedure.  You have difficulty urinating or breathing. This information is not intended to replace advice given to you by your health care provider. Make sure you discuss any questions you have with your health care provider. Document Released: 07/19/2012 Document Revised: 12/17/2015 Document Reviewed: 04/28/2015 Elsevier Interactive Patient Education  2017 [employer].

## 2017-01-03 NOTE — Progress Notes (Signed)
Subjective:  Patient ID: Katherine [name], female    DOB: [birth year]  Age: 83 y.o. MRN: [id number]  CC: Back Pain (lower)  Procedure today:Caudal epidural steroid No. 2 under fluoroscopic as moderate sedation Previous procedure Caudal epidural steroid No. 1 under fluoroscopic guidance with moderate sedation on 11/22/2016 Previous Caudal epidural steroid 2017 under fluoroscopic guidance and moderate sedation 04/06/2016 Previous PROCEDURE last visit :  Caudal epidural steroid No. 1 of series with fluoroscopic guidance and moderate sedation 02/24/2016  HPI Katherine [name] presents for caudal epidural steroid. She reports that she did not achieve as much relief as she had hoped for with her first caudal epidural at her last visit. She still having right greater than left lower leg pain. She has some low back pain as well no change in bowel or bladder function or lower extremity strength or function is noted. She continues to have some paraspinous muscle tenderness the previous scar is well-healed. Otherwise she is taking her medications as prescribed and we have reviewed her narcotic assessment sheet. She continues to derive functional benefit from the Vicodin generally taken once a day. No significant side effects are noted.   She has a long-standing history of diffuse body pain including low back pain. The pain she experiences rates at a VAS of 10 and best 7 but has gradually gotten worse to the point where she has soft hypotension. She has pain in the afternoon and night and with immobility. Aggravating factors include bending climbing kneeling motion sitting standing squatting and twisting. Alleviating factors include rest sleep lying down and medication management. In the past she has had epidural steroid injections which seem to help alleviate some of her low back pain.  Currently the pain complaints she experiences is similar to what she had before with associated tingling worse on the left posterior and  lateral leg with associated weakness and pain that keeps her up at night. It is described as aching and cramping deep disabling. She has had an MRI which reveals multilevel facet degeneration in the lumbosacral spine and evidence of multilevel degenerative disc disease.  History Katherine [name] has a past medical history of Arthritis; CHF (congestive heart failure) ([place]); Chicken pox; Chronic combined systolic and diastolic heart failure (HCC); COPD (chronic obstructive pulmonary disease) ([place]); GERD (gastroesophageal reflux disease); Hyperlipidemia; Hypertension; Hypertensive heart disease; Ischemic cardiomyopathy; and Occlusive coronary artery disease requiring drug therapy.   She has a past surgical history that includes Shoulder surgery; Cardiac catheterization (N/A, 10/30/2015); Cardiac catheterization (N/A, 10/30/2015); Total hip arthroplasty (Right, 2012); Back surgery (2010); Amputation finger (Left, 1966); Appendectomy (1961); Tonsillectomy (1953); Abdominal hysterectomy (1985); and ORIF ankle fracture (Right, 08/24/2016).   Her family history includes Arthritis in her sister and sister; Breast cancer in her sister; CAD in her brother and sister; Heart disease in her brother, brother, brother, brother, brother, brother, mother, sister, sister, sister, and sister; Hyperlipidemia in her mother; Hypertension in her brother, brother, brother, brother, brother, brother, mother, sister, sister, sister, and sister; Lung cancer in her mother; Stroke in her mother, sister, sister, sister, and sister.She reports that she has never smoked. She has never used smokeless tobacco. She reports that she does not drink alcohol or use drugs.  No results found for this or any previous visit.  No results found for: TOXASSSELUR  Outpatient Medications Prior to Visit  Medication Sig Dispense Refill  . amLODipine (NORVASC) 5 MG tablet Take 1 tablet (5 mg total) by mouth daily. 90 tablet 3  . atorvastatin (  LIPITOR) 80 MG tablet  Take 1 tablet (80 mg total) by mouth daily. 90 tablet 3  . carvedilol (COREG) 6.25 MG tablet Take 1 tablet (6.25 mg total) by mouth 2 (two) times daily with a meal. 180 tablet 3  . cholecalciferol (VITAMIN D) 1000 units tablet Take 1,000 Units by mouth daily.    . furosemide (LASIX) 20 MG tablet Take 1 tablet (20 mg total) by mouth every other day. 90 tablet 3  . lisinopril (PRINIVIL,ZESTRIL) 20 MG tablet Take 20 mg by mouth daily.    . multivitamin-iron-minerals-folic acid (CENTRUM) chewable tablet Chew 1 tablet by mouth daily.    . nitroGLYCERIN (NITROSTAT) 0.4 MG SL tablet Place 1 tablet (0.4 mg total) under the tongue every 5 (five) minutes as needed for chest pain. 25 tablet 3  . omega-3 acid ethyl esters (LOVAZA) 1 g capsule Take 1 g by mouth daily.    . pantoprazole (PROTONIX) 40 MG tablet Take 1 tablet (40 mg total) by mouth daily. 90 tablet 2  . polyethylene glycol (MIRALAX / GLYCOLAX) packet Take 17 g by mouth daily as needed for mild constipation. 14 each 0  . pregabalin (LYRICA) 75 MG capsule Take 1 capsule (75 mg total) by mouth 2 (two) times daily. 180 capsule 3  . sertraline (ZOLOFT) 100 MG tablet Take 1 tablet (100 mg total) by mouth daily. 90 tablet 3  . thiamine (VITAMIN B-1) 100 MG tablet Take 1 tablet (100 mg total) by mouth daily. 90 tablet 1  . aspirin EC 81 MG tablet Take 325 mg by mouth daily.     Marland Kitchen HYDROcodone-acetaminophen (NORCO/VICODIN) 5-325 MG tablet Take 1 tablet by mouth every 6 (six) hours as needed for moderate pain or severe pain. 30 tablet 0   No facility-administered medications prior to visit.    Lab Results  Component Value Date   WBC 12.0 (H) 10/28/2016   HGB 12.2 10/28/2016   HCT 36.8 10/28/2016   PLT 163 10/28/2016   GLUCOSE 117 (H) 10/28/2016   CHOL 114 12/16/2015   TRIG 126 12/16/2015   HDL 41 12/16/2015   LDLCALC 48 12/16/2015   ALT 25 10/28/2016   AST 46 (H) 10/28/2016   NA 138 10/28/2016   K 3.9 10/28/2016   CL 104 10/28/2016    CREATININE 0.82 10/28/2016   BUN 11 10/28/2016   CO2 28 10/28/2016   TSH 0.345 (L) 10/30/2015   INR 1.12 10/30/2015   HGBA1C 6.0 (H) 10/30/2015    --------------------------------------------------------------------------------------------------------------------- Dg Knee 1-2 Views Left  01/07/2016  CLINICAL DATA:  Left anterior knee pain beginning today.  No injury. EXAM: LEFT KNEE - 1-2 VIEW COMPARISON:  None. FINDINGS: No evidence of fracture, dislocation, or joint effusion. Chondrocalcinosis. No notable degenerative changes for age. Osteopenia. IMPRESSION: No acute finding. Electronically Signed   By: Monte Fantasia M.D.   On: 01/07/2016 20:00   Mr Lumbar Spine Wo Contrast  01/08/2016  CLINICAL DATA:  Low back pain with left thigh pain radiating down to the left foot EXAM: MRI LUMBAR SPINE WITHOUT CONTRAST TECHNIQUE: Multiplanar, multisequence MR imaging of the lumbar spine was performed. No intravenous contrast was administered. COMPARISON:  None. FINDINGS: Segmentation:  Standard. Alignment: 8 mm of anterolisthesis of L4 on L5 secondary to bilateral L4 pars interarticularis defects. The alignment is otherwise anatomic. Vertebrae:  No fracture, evidence of discitis, or bone lesion. Conus medullaris: Extends to the T12-L1 level and appears normal. Paraspinal and other soft tissues: Negative. Disc levels: Disc spaces: Degenerative disc  disease with disc height loss at L3-4, L4-5 and L5-S1. T11-12: Mild broad-based disc bulge. Mild bilateral facet arthropathy. No foraminal or central canal stenosis. T12-L1: No significant disc bulge. No evidence of neural foraminal stenosis. No central canal stenosis. L1-L2: Mild broad-based disc bulge. Mild bilateral facet arthropathy. No evidence of neural foraminal stenosis. No central canal stenosis. L2-L3: Mild broad-based disc bulge. Moderate right facet arthropathy. Mild right foraminal stenosis. Right lateral recess stenosis. No central canal stenosis. L3-L4:  Mild broad-based disc bulge. Mild bilateral facet arthropathy. Mild right foraminal stenosis. Right lateral recess stenosis. No left foraminal stenosis. No central canal stenosis. L4-L5: No disc protrusion. Grade 1 anterolisthesis of L4 on L5. Severe bilateral facet arthropathy and bilateral L4 pars interarticularis defects. Severe bilateral foraminal stenosis. Mild spinal stenosis. L5-S1: Mild broad-based disc bulge. Mild bilateral facet arthropathy. Mild left foraminal stenosis. No central canal stenosis. IMPRESSION: 1. At L4-5 there is grade 1 anterolisthesis of L4 on L5. Severe bilateral facet arthropathy and bilateral L4 pars interarticularis defects. Severe bilateral foraminal stenosis. Mild spinal stenosis. 2. At L2-3 there is a mild broad-based disc bulge. Moderate right facet arthropathy. Mild right foraminal stenosis. Right lateral recess stenosis. 3. At L3-4 there is a mild Mild broad-based disc bulge. Mild bilateral facet arthropathy. Mild right foraminal stenosis. Right lateral recess stenosis. Electronically Signed   By: Kathreen Devoid   On: 01/08/2016 09:27   Ct Cta Abd/pel W/cm &/or W/o Cm  01/07/2016  ADDENDUM REPORT: 01/07/2016 19:54 ADDENDUM: The there are a few small bilateral sub cm renal cortical hypodensities too small to characterize but likely cysts. Electronically Signed   By: Marin Olp M.D.   On: 01/07/2016 19:54  01/07/2016  CLINICAL DATA:  Sciatica.  Possible dehydration. EXAM: CTA ABDOMEN AND PELVIS wITHOUT AND WITH CONTRAST TECHNIQUE: Multidetector CT imaging of the abdomen and pelvis was performed using the standard protocol during bolus administration of intravenous contrast. Multiplanar reconstructed images and MIPs were obtained and reviewed to evaluate the vascular anatomy. CONTRAST:  100 mL Isovue 370 IV COMPARISON:  None. FINDINGS: Lung bases are within normal. Abdominal images demonstrate multiple splenic granulomas. The liver, pancreas, gallbladder and adrenal glands are  normal. Kidneys are normal in size without hydronephrosis or nephrolithiasis. There is a 2.3 cm cyst over the mid pole the right kidney. Ureters are within normal. There is mild calcified plaque over the abdominal aorta iliac arteries. Mild focal stenosis at the takeoff of the celiac axis for comparison Appendix is not well visualized. Minimal diverticulosis of the colon which is otherwise within normal. Small bowel is within normal. Mesentery is unremarkable without inflammatory change or free fluid. Pelvic images demonstrate mild bladder distension. Surgical absence of the uterus. No free pelvic fluid. Right hip arthroplasty intact. There are degenerative changes of the spine with multilevel disc disease over the lumbosacral spine mean grade 1-2 anterolisthesis of L4 on L5. Previous L3-L5 laminectomy. Several metallic densities over the inguinal regions. Review of the MIP images confirms the above findings. IMPRESSION: No acute findings in the abdomen/ pelvis. 2.3 cm right renal cyst. Minimal diverticulosis of the colon. Significant degenerative changes with multilevel disc disease over the lumbosacral spine. Grade 1-2 anterolisthesis L4 on L5. Electronically Signed: By: Marin Olp M.D. On: 01/07/2016 18:26       ---------------------------------------------------------------------------------------------------------------------- Past Medical History:  Diagnosis Date  . Arthritis   . CHF (congestive heart failure) ([place])   . Chicken pox   . Chronic combined systolic and diastolic heart failure ([place])  a. 10/2015 Echo: EF 35-40%, Akinesis of mid-apicalanteroseptal wall & AK of apex, Gr 1DD.  Marland Kitchen COPD (chronic obstructive pulmonary disease) ([place])    worked in a [employer]  . GERD (gastroesophageal reflux disease)   . Hyperlipidemia   . Hypertension   . Hypertensive heart disease   . Ischemic cardiomyopathy    a. 10/2015 Echo: EF 35-40%, Akinesis of mid-apicalanteroseptal wall & AK of apex, Gr 1DD.   Marland Kitchen Occlusive coronary artery disease requiring drug therapy    a. Remote MI - never sought treatment when it occurred, shows up old on EKG;  b. Cath 10/2015 - 100% pLAD CTO with 80% D1, prox Cx 60% (FFR 0.92-->med Rx) - EF ~35%    Past Surgical History:  Procedure Laterality Date  . ABDOMINAL HYSTERECTOMY  1985  . AMPUTATION FINGER Left 1966   ring finger  . APPENDECTOMY  1961  . BACK SURGERY  2010   lower back  . CARDIAC CATHETERIZATION N/A 10/30/2015   Procedure: Right/Left Heart Cath and Coronary Angiography;  Surgeon: Wellington Hampshire, MD;  Location: [facility];  Service: Cardiovascular;  Laterality: N/A;  . CARDIAC CATHETERIZATION N/A 10/30/2015   Procedure: Intravascular Pressure Wire/FFR Study;  Surgeon: Wellington Hampshire, MD;  Location: [facility];  Service: Cardiovascular;  Laterality: N/A;  . ORIF ANKLE FRACTURE Right 08/24/2016   Procedure: OPEN REDUCTION INTERNAL FIXATION (ORIF) ANKLE FRACTURE;  Surgeon: Thornton Park, MD;  Location: [facility];  Service: Orthopedics;  Laterality: Right;  . SHOULDER SURGERY    . TONSILLECTOMY  1953  . TOTAL HIP ARTHROPLASTY Right 2012    Family History  Problem Relation Age of Onset  . Heart disease Mother   . Stroke Mother   . Hypertension Mother   . Lung cancer Mother   . Hyperlipidemia Mother   . CAD Sister   . Heart disease Sister   . Stroke Sister   . Hypertension Sister   . Arthritis Sister   . CAD Brother   . Heart disease Brother   . Hypertension Brother   . Heart disease Sister   . Stroke Sister   . Hypertension Sister   . Arthritis Sister   . Heart disease Sister   . Stroke Sister   . Hypertension Sister   . Breast cancer Sister   . Heart disease Sister   . Stroke Sister   . Hypertension Sister   . Heart disease Brother   . Hypertension Brother   . Heart disease Brother   . Hypertension Brother   . Heart disease Brother   . Hypertension Brother   . Heart disease Brother   . Hypertension Brother    . Heart disease Brother   . Hypertension Brother     Social History  Substance Use Topics  . Smoking status: Never Smoker  . Smokeless tobacco: Never Used  . Alcohol use No    ---------------------------------------------------------------------------------------------------------------------- Social History   Social History  . Marital status: Single    Spouse name: N/A  . Number of children: N/A  . Years of education: N/A   Social History Main Topics  . Smoking status: Never Smoker  . Smokeless tobacco: Never Used  . Alcohol use No  . Drug use: No  . Sexual activity: No   Other Topics Concern  . None   Social History Narrative   Patient lives in [place] but visits her family in [place] frequently. She is very active and walks often.  BP (!) 195/82   Pulse (!) 54   Temp (!) 96.9 F (36.1 C)   Resp 17   Ht 5\' 1"  (1.549 m)   Wt 147 lb (66.7 kg)   SpO2 98%   BMI 27.78 kg/m    BP Readings from Last 3 Encounters:  01/03/17 (!) 195/82  12/23/16 110/68  12/09/16 (!) 166/94     Wt Readings from Last 3 Encounters:  01/03/17 147 lb (66.7 kg)  12/23/16 147 lb 3.2 oz (66.8 kg)  12/09/16 146 lb 6 oz (66.4 kg)     ----------------------------------------------------------------------------------------------------------------------  ROS Review of Systems  Cardiac: No angina GI: No constipation  Objective:  BP (!) 195/82   Pulse (!) 54   Temp (!) 96.9 F (36.1 C)   Resp 17   Ht 5\' 1"  (1.549 m)   Wt 147 lb (66.7 kg)   SpO2 98%   BMI 27.78 kg/m   Physical Exam Pupils are equally round reactive to light with extraocular muscles intact Patient is a good historian appropriate cooperative compliant Heart is regular rhythm Palpation of low back reveals some paraspinous muscle tenderness but no overt trigger points. She does have pain with extension at the low back and still has pain with a straight leg raise while in the seated position. This is  noted on the right side.    Assessment & Plan:   Katherine [name] was seen today for back pain.  Diagnoses and all orders for this visit:  Spinal stenosis of lumbar region with neurogenic claudication  Chronic sciatica, left -     Lumbar Epidural Injection -     LUMBAR FACET(MEDIAL BRANCH NERVE BLOCK) MBNB; Future -     triamcinolone acetonide (KENALOG-40) injection 40 mg; 1 mL (40 mg total) by Other route once. -     sodium chloride flush (NS) 0.9 % injection 10 mL; 10 mLs by Other route once. -     ropivacaine (PF) 2 mg/mL (0.2%) (NAROPIN) injection 10 mL; 10 mLs by Epidural route once. -     midazolam (VERSED) 5 MG/5ML injection 5 mg; Inject 5 mLs (5 mg total) into the vein once. -     lidocaine (PF) (XYLOCAINE) 1 % injection 5 mL; Inject 5 mLs into the skin once. -     lactated ringers infusion 1,000 mL; Inject 1,000 mLs into the vein continuous. -     iopamidol (ISOVUE-M) 41 % intrathecal injection 20 mL; 20 mLs by Other route once as needed for contrast.  Other orders -     Discontinue: HYDROcodone-acetaminophen (NORCO/VICODIN) 5-325 MG tablet; Take 1 tablet by mouth every 6 (six) hours as needed for moderate pain or severe pain. -     HYDROcodone-acetaminophen (NORCO/VICODIN) 5-325 MG tablet; Take 1 tablet by mouth every 6 (six) hours as needed for moderate pain or severe pain.     ----------------------------------------------------------------------------------------------------------------------  Problem List Items Addressed This Visit    None    Visit Diagnoses    Spinal stenosis of lumbar region with neurogenic claudication    -  Primary   Chronic sciatica, left       Relevant Medications   triamcinolone acetonide (KENALOG-40) injection 40 mg (Completed)   sodium chloride flush (NS) 0.9 % injection 10 mL (Completed)   ropivacaine (PF) 2 mg/mL (0.2%) (NAROPIN) injection 10 mL (Completed)   midazolam (VERSED) 5 MG/5ML injection 5 mg (Completed)   lidocaine (PF) (XYLOCAINE) 1  % injection 5 mL (Completed)   lactated ringers infusion 1,000 mL  iopamidol (ISOVUE-M) 41 % intrathecal injection 20 mL   Other Relevant Orders   Lumbar Epidural Injection   LUMBAR FACET(MEDIAL BRANCH NERVE BLOCK) MBNB      ----------------------------------------------------------------------------------------------------------------------  1. Spinal stenosis of lumbar region  We'll proceed with her second caudal epidural steroid injection today. We've gone over the risks and benefits of the procedure with her in full detail all questions answered no guarantees were made. We'll have her return to clinic in 2 months for reevaluation possible repeat injection at that time. 2. Bilateral lumbar facet syndrome I have gone over her MRI findings and there is evidence of multilevel moderate facet arthropathy in the lumbar region. Based on clinical findings today we may alter our protocol at her next visit and favor her right side lumbar facet injection for diagnostic purpose. I discussed this with her at length today and reviewed her questions and feels she is informed. We'll make this decision contingent on how she responds to her second caudal epidural steroid injection today.  2. Failed back syndrome of lumbar spine We have talked about the importance of sparing use of  Vicodin and we will refill her medications for the next 2 months. We have reviewed the [facility] practitioner database and it is appropriate as well.    ----------------------------------------------------------------------------------------------------------------------  I am having Katherine [name] maintain her aspirin EC, nitroGLYCERIN, omega-3 acid ethyl esters, sertraline, multivitamin-iron-minerals-folic acid, pantoprazole, polyethylene glycol, thiamine, lisinopril, atorvastatin, furosemide, pregabalin, amLODipine, carvedilol, cholecalciferol, HYDROcodone-acetaminophen, and aspirin. We administered triamcinolone acetonide,  sodium chloride flush, ropivacaine (PF) 2 mg/mL (0.2%), midazolam, lidocaine (PF), lactated ringers, and iopamidol.   Meds ordered this encounter  Medications  . DISCONTD: HYDROcodone-acetaminophen (NORCO/VICODIN) 5-325 MG tablet    Sig: Take 1 tablet by mouth every 6 (six) hours as needed for moderate pain or severe pain.    Dispense:  30 tablet    Refill:  0  . HYDROcodone-acetaminophen (NORCO/VICODIN) 5-325 MG tablet    Sig: Take 1 tablet by mouth every 6 (six) hours as needed for moderate pain or severe pain.    Dispense:  30 tablet    Refill:  0    Do not fill until 1937902  . triamcinolone acetonide (KENALOG-40) injection 40 mg  . sodium chloride flush (NS) 0.9 % injection 10 mL  . ropivacaine (PF) 2 mg/mL (0.2%) (NAROPIN) injection 10 mL  . midazolam (VERSED) 5 MG/5ML injection 5 mg  . lidocaine (PF) (XYLOCAINE) 1 % injection 5 mL  . lactated ringers infusion 1,000 mL  . iopamidol (ISOVUE-M) 41 % intrathecal injection 20 mL  . aspirin 325 MG tablet    Sig: Take 325 mg by mouth daily.   Follow-up: Return for evaluation, procedure.   Caudal epidural steroid #2 under fluoroscopic as moderate sedation  After informed consent was obtained and the risks benefits reviewed patient chose to pursue a caudal epidural steroid injection today. With the patient in the prone position and an IV in place, sedation was with IV 1 mg versed. Using AP and lateral fluoroscopic guidance I identified the area overlying the sacral hiatus. This area was broadly prepped with DuraPrep 3 and we utilized strict aseptic technique during the procedure. 1% lidocaine was infiltrated 2 cc using a 25-gauge needle overlying the sacral cornu and then using lateral fluoroscopic guidance I advanced an 18-gauge Touhy needle approximately 2 cm through the sacral hiatus. Confirmation was with 2 cc of Isovue yielding good epidural spread and no evidence of IV or subarachnoid uptake. This was followed by  an injection of 5  cc of saline mixed with 1 cc of ropivacaine 0.2% and 40 mg of triamcinolone. This was tolerated without difficulty the patient was convalesced and discharged home in stable condition for follow-up as mentioned. Katherine Cater, MD  This dictation was performed utilizing Dragon voice recognition software.  Please excuse any unintentional or mistaken typographical errors as a result of its unedited utilization.

## 2017-01-04 ENCOUNTER — Telehealth: Payer: Self-pay

## 2017-01-04 NOTE — Telephone Encounter (Signed)
Denies any needs at this time. Instructed to call if needed. 

## 2017-01-05 DIAGNOSIS — I255 Ischemic cardiomyopathy: Secondary | ICD-10-CM | POA: Diagnosis not present

## 2017-01-05 DIAGNOSIS — J449 Chronic obstructive pulmonary disease, unspecified: Secondary | ICD-10-CM | POA: Diagnosis not present

## 2017-01-05 DIAGNOSIS — M25511 Pain in right shoulder: Secondary | ICD-10-CM | POA: Diagnosis not present

## 2017-01-05 DIAGNOSIS — I5042 Chronic combined systolic (congestive) and diastolic (congestive) heart failure: Secondary | ICD-10-CM | POA: Diagnosis not present

## 2017-01-05 DIAGNOSIS — M1990 Unspecified osteoarthritis, unspecified site: Secondary | ICD-10-CM | POA: Diagnosis not present

## 2017-01-05 DIAGNOSIS — R69 Illness, unspecified: Secondary | ICD-10-CM | POA: Diagnosis not present

## 2017-01-05 DIAGNOSIS — E785 Hyperlipidemia, unspecified: Secondary | ICD-10-CM | POA: Diagnosis not present

## 2017-01-05 DIAGNOSIS — I110 Hypertensive heart disease with heart failure: Secondary | ICD-10-CM | POA: Diagnosis not present

## 2017-01-05 DIAGNOSIS — I2510 Atherosclerotic heart disease of native coronary artery without angina pectoris: Secondary | ICD-10-CM | POA: Diagnosis not present

## 2017-01-05 DIAGNOSIS — S82851D Displaced trimalleolar fracture of right lower leg, subsequent encounter for closed fracture with routine healing: Secondary | ICD-10-CM | POA: Diagnosis not present

## 2017-01-07 ENCOUNTER — Ambulatory Visit: Payer: Medicare HMO | Admitting: Surgery

## 2017-01-07 DIAGNOSIS — M25511 Pain in right shoulder: Secondary | ICD-10-CM | POA: Diagnosis not present

## 2017-01-07 DIAGNOSIS — I2510 Atherosclerotic heart disease of native coronary artery without angina pectoris: Secondary | ICD-10-CM | POA: Diagnosis not present

## 2017-01-07 DIAGNOSIS — I110 Hypertensive heart disease with heart failure: Secondary | ICD-10-CM | POA: Diagnosis not present

## 2017-01-07 DIAGNOSIS — I5042 Chronic combined systolic (congestive) and diastolic (congestive) heart failure: Secondary | ICD-10-CM | POA: Diagnosis not present

## 2017-01-07 DIAGNOSIS — J449 Chronic obstructive pulmonary disease, unspecified: Secondary | ICD-10-CM | POA: Diagnosis not present

## 2017-01-07 DIAGNOSIS — S82851D Displaced trimalleolar fracture of right lower leg, subsequent encounter for closed fracture with routine healing: Secondary | ICD-10-CM | POA: Diagnosis not present

## 2017-01-07 DIAGNOSIS — E785 Hyperlipidemia, unspecified: Secondary | ICD-10-CM | POA: Diagnosis not present

## 2017-01-07 DIAGNOSIS — I255 Ischemic cardiomyopathy: Secondary | ICD-10-CM | POA: Diagnosis not present

## 2017-01-07 DIAGNOSIS — M1990 Unspecified osteoarthritis, unspecified site: Secondary | ICD-10-CM | POA: Diagnosis not present

## 2017-01-07 DIAGNOSIS — R69 Illness, unspecified: Secondary | ICD-10-CM | POA: Diagnosis not present

## 2017-01-07 NOTE — Telephone Encounter (Signed)
Gave verbal order to Dwyane Luo at [facility] thanks

## 2017-01-07 NOTE — Telephone Encounter (Signed)
Katherine [name] from [facility] requesting a referral to continue home PT.  Can give verbal order. cb [phone]

## 2017-01-11 ENCOUNTER — Telehealth: Payer: Self-pay | Admitting: Family Medicine

## 2017-01-11 DIAGNOSIS — I110 Hypertensive heart disease with heart failure: Secondary | ICD-10-CM | POA: Diagnosis not present

## 2017-01-11 DIAGNOSIS — I2510 Atherosclerotic heart disease of native coronary artery without angina pectoris: Secondary | ICD-10-CM | POA: Diagnosis not present

## 2017-01-11 DIAGNOSIS — I5042 Chronic combined systolic (congestive) and diastolic (congestive) heart failure: Secondary | ICD-10-CM | POA: Diagnosis not present

## 2017-01-11 DIAGNOSIS — I255 Ischemic cardiomyopathy: Secondary | ICD-10-CM | POA: Diagnosis not present

## 2017-01-11 DIAGNOSIS — S82851D Displaced trimalleolar fracture of right lower leg, subsequent encounter for closed fracture with routine healing: Secondary | ICD-10-CM | POA: Diagnosis not present

## 2017-01-11 DIAGNOSIS — R69 Illness, unspecified: Secondary | ICD-10-CM | POA: Diagnosis not present

## 2017-01-11 DIAGNOSIS — M25511 Pain in right shoulder: Secondary | ICD-10-CM | POA: Diagnosis not present

## 2017-01-11 DIAGNOSIS — J449 Chronic obstructive pulmonary disease, unspecified: Secondary | ICD-10-CM | POA: Diagnosis not present

## 2017-01-11 DIAGNOSIS — M1990 Unspecified osteoarthritis, unspecified site: Secondary | ICD-10-CM | POA: Diagnosis not present

## 2017-01-11 DIAGNOSIS — E785 Hyperlipidemia, unspecified: Secondary | ICD-10-CM | POA: Diagnosis not present

## 2017-01-11 MED ORDER — SERTRALINE HCL 100 MG PO TABS: 100.0000 mg | ORAL_TABLET | Freq: Every day | ORAL | 0 refills | Status: DC

## 2017-01-11 NOTE — Telephone Encounter (Signed)
Pt niece called about pt needing a refill for sertraline (ZOLOFT) 100 MG tablet  Pt will be going out of town on 01/13/17.  Pharmacy is [id number] - [place], [place]  Call niece @ [phone]. Thank you!

## 2017-01-11 NOTE — Telephone Encounter (Addendum)
Left voice mail to call back for [place] sent

## 2017-01-14 NOTE — Telephone Encounter (Signed)
Spoke with sister patient already left going out of town picked up meds.

## 2017-01-25 ENCOUNTER — Telehealth: Payer: Self-pay | Admitting: *Deleted

## 2017-01-25 MED ORDER — LISINOPRIL 20 MG PO TABS: 20.0000 mg | ORAL_TABLET | Freq: Every day | ORAL | 1 refills | Status: AC

## 2017-01-25 MED ORDER — PANTOPRAZOLE SODIUM 40 MG PO TBEC: 40.0000 mg | DELAYED_RELEASE_TABLET | Freq: Every day | ORAL | 1 refills | Status: AC

## 2017-01-25 NOTE — Telephone Encounter (Signed)
Sent rx, thanks.

## 2017-01-25 NOTE — Telephone Encounter (Signed)
Medication Refill requested for : lisinopril and pantoprazole  [facility] [place] Return Contact :

## 2017-01-26 DIAGNOSIS — S82851D Displaced trimalleolar fracture of right lower leg, subsequent encounter for closed fracture with routine healing: Secondary | ICD-10-CM | POA: Diagnosis not present

## 2017-02-09 ENCOUNTER — Encounter: Payer: Self-pay | Admitting: Cardiovascular Disease

## 2017-02-09 NOTE — Telephone Encounter (Signed)
This encounter was created in error - please disregard.

## 2017-02-11 ENCOUNTER — Telehealth: Payer: Self-pay | Admitting: *Deleted

## 2017-02-11 NOTE — Telephone Encounter (Addendum)
Patient states she was only in town for a short say and will not be coming back in town for [employer]. Spoke with patient she is still out of town in [place] and unable to get done.   She will not be in town anytime soon.

## 2017-02-11 NOTE — Telephone Encounter (Signed)
[name] from [facility] requested a call in reference to pt having a bone density order. Patient is currently back in town and need a order for a bone density.  [employer] [phone]

## 2017-02-11 NOTE — Telephone Encounter (Signed)
Left voice mail to call back for [facility]

## 2017-02-11 NOTE — Telephone Encounter (Signed)
[name] at [facility]  advised of below and verbalized understanding.

## 2017-02-21 DIAGNOSIS — H26493 Other secondary cataract, bilateral: Secondary | ICD-10-CM | POA: Diagnosis not present

## 2017-02-25 DIAGNOSIS — S82851D Displaced trimalleolar fracture of right lower leg, subsequent encounter for closed fracture with routine healing: Secondary | ICD-10-CM | POA: Diagnosis not present

## 2017-03-03 DIAGNOSIS — I255 Ischemic cardiomyopathy: Secondary | ICD-10-CM | POA: Diagnosis not present

## 2017-03-03 DIAGNOSIS — I10 Essential (primary) hypertension: Secondary | ICD-10-CM | POA: Diagnosis not present

## 2017-03-03 DIAGNOSIS — I25118 Atherosclerotic heart disease of native coronary artery with other forms of angina pectoris: Secondary | ICD-10-CM | POA: Diagnosis not present

## 2017-03-03 DIAGNOSIS — Z79899 Other long term (current) drug therapy: Secondary | ICD-10-CM | POA: Diagnosis not present

## 2017-03-03 DIAGNOSIS — I209 Angina pectoris, unspecified: Secondary | ICD-10-CM | POA: Diagnosis not present

## 2017-03-07 DIAGNOSIS — Z79899 Other long term (current) drug therapy: Secondary | ICD-10-CM | POA: Diagnosis not present

## 2017-03-10 DIAGNOSIS — I255 Ischemic cardiomyopathy: Secondary | ICD-10-CM | POA: Diagnosis not present

## 2017-03-10 DIAGNOSIS — R0602 Shortness of breath: Secondary | ICD-10-CM | POA: Diagnosis not present

## 2017-03-10 DIAGNOSIS — I2589 Other forms of chronic ischemic heart disease: Secondary | ICD-10-CM | POA: Diagnosis not present

## 2017-03-10 DIAGNOSIS — I209 Angina pectoris, unspecified: Secondary | ICD-10-CM | POA: Diagnosis not present

## 2017-03-10 DIAGNOSIS — I10 Essential (primary) hypertension: Secondary | ICD-10-CM | POA: Diagnosis not present

## 2017-03-21 ENCOUNTER — Ambulatory Visit: Payer: Medicare HMO | Admitting: Cardiovascular Disease

## 2017-03-25 DIAGNOSIS — H26492 Other secondary cataract, left eye: Secondary | ICD-10-CM | POA: Diagnosis not present

## 2017-03-28 ENCOUNTER — Telehealth: Payer: Self-pay | Admitting: *Deleted

## 2017-03-28 ENCOUNTER — Ambulatory Visit: Payer: Medicare HMO | Admitting: Family Medicine

## 2017-03-28 DIAGNOSIS — S82851D Displaced trimalleolar fracture of right lower leg, subsequent encounter for closed fracture with routine healing: Secondary | ICD-10-CM | POA: Diagnosis not present

## 2017-03-28 NOTE — Telephone Encounter (Signed)
Patient states she is having arthritis in hands joints , back hurting in shoulder blades.   She thinks this is arthritis. Would like something called in for arthritis .   She hasn't established with PCP in [place].   [facility] [place].

## 2017-03-28 NOTE — Telephone Encounter (Signed)
Patient advised of below and verbalized understanding in regards to arthritis drugs being contraindiated.  Patient wonders if she can take Tylenol.  Ok to fill sertraline since in [place].  She also wants you to know how much she appreciates your care over the past years .

## 2017-03-28 NOTE — Telephone Encounter (Signed)
Left voice mail to call back 

## 2017-03-28 NOTE — Telephone Encounter (Signed)
Pt has requested a script for sertraline  [facility] [address] Phone [phone] Pt is also requesting a script for arthritis , pt is currently out of town, pt requested to speak to someone as well

## 2017-03-28 NOTE — Telephone Encounter (Signed)
I would just use her pain medication. The arthritis drugs are contraindicated given her cardiac disease.

## 2017-03-28 NOTE — Telephone Encounter (Signed)
Pt called back returning your call. Thank you! °

## 2017-03-29 MED ORDER — SERTRALINE HCL 100 MG PO TABS: 100.0000 mg | ORAL_TABLET | Freq: Every day | ORAL | 0 refills | Status: AC

## 2017-03-29 NOTE — Telephone Encounter (Signed)
Okay to take tylenol.

## 2017-03-29 NOTE — Telephone Encounter (Signed)
Patient advised of below and verbalized understanding.  

## 2017-04-09 ENCOUNTER — Other Ambulatory Visit: Payer: Self-pay | Admitting: Family Medicine

## 2017-04-16 DIAGNOSIS — I517 Cardiomegaly: Secondary | ICD-10-CM | POA: Diagnosis not present

## 2017-04-16 DIAGNOSIS — I25119 Atherosclerotic heart disease of native coronary artery with unspecified angina pectoris: Secondary | ICD-10-CM | POA: Diagnosis not present

## 2017-04-16 DIAGNOSIS — R079 Chest pain, unspecified: Secondary | ICD-10-CM | POA: Diagnosis not present

## 2017-04-16 DIAGNOSIS — R001 Bradycardia, unspecified: Secondary | ICD-10-CM | POA: Diagnosis not present

## 2017-04-28 DIAGNOSIS — I2510 Atherosclerotic heart disease of native coronary artery without angina pectoris: Secondary | ICD-10-CM | POA: Diagnosis not present

## 2017-04-28 DIAGNOSIS — I10 Essential (primary) hypertension: Secondary | ICD-10-CM | POA: Diagnosis not present

## 2017-04-28 DIAGNOSIS — I255 Ischemic cardiomyopathy: Secondary | ICD-10-CM | POA: Diagnosis not present

## 2017-04-28 DIAGNOSIS — R0789 Other chest pain: Secondary | ICD-10-CM | POA: Diagnosis not present

## 2017-04-28 DIAGNOSIS — S82851D Displaced trimalleolar fracture of right lower leg, subsequent encounter for closed fracture with routine healing: Secondary | ICD-10-CM | POA: Diagnosis not present

## 2017-05-02 DIAGNOSIS — I2510 Atherosclerotic heart disease of native coronary artery without angina pectoris: Secondary | ICD-10-CM | POA: Diagnosis not present

## 2017-05-02 DIAGNOSIS — I2582 Chronic total occlusion of coronary artery: Secondary | ICD-10-CM | POA: Diagnosis not present

## 2017-05-02 DIAGNOSIS — Z7982 Long term (current) use of aspirin: Secondary | ICD-10-CM | POA: Diagnosis not present

## 2017-05-02 DIAGNOSIS — Z79899 Other long term (current) drug therapy: Secondary | ICD-10-CM | POA: Diagnosis not present

## 2017-05-02 DIAGNOSIS — I10 Essential (primary) hypertension: Secondary | ICD-10-CM | POA: Diagnosis not present

## 2017-05-02 DIAGNOSIS — I255 Ischemic cardiomyopathy: Secondary | ICD-10-CM | POA: Diagnosis not present

## 2017-05-03 DIAGNOSIS — I10 Essential (primary) hypertension: Secondary | ICD-10-CM | POA: Diagnosis not present

## 2017-05-03 DIAGNOSIS — I2510 Atherosclerotic heart disease of native coronary artery without angina pectoris: Secondary | ICD-10-CM | POA: Diagnosis not present

## 2017-05-03 DIAGNOSIS — I255 Ischemic cardiomyopathy: Secondary | ICD-10-CM | POA: Diagnosis not present

## 2017-05-23 DIAGNOSIS — L570 Actinic keratosis: Secondary | ICD-10-CM | POA: Diagnosis not present

## 2017-05-23 DIAGNOSIS — C44329 Squamous cell carcinoma of skin of other parts of face: Secondary | ICD-10-CM | POA: Diagnosis not present

## 2017-05-23 DIAGNOSIS — D485 Neoplasm of uncertain behavior of skin: Secondary | ICD-10-CM | POA: Diagnosis not present

## 2017-05-28 DIAGNOSIS — S82851D Displaced trimalleolar fracture of right lower leg, subsequent encounter for closed fracture with routine healing: Secondary | ICD-10-CM | POA: Diagnosis not present

## 2017-06-01 DIAGNOSIS — I2510 Atherosclerotic heart disease of native coronary artery without angina pectoris: Secondary | ICD-10-CM | POA: Diagnosis not present

## 2017-06-01 DIAGNOSIS — I10 Essential (primary) hypertension: Secondary | ICD-10-CM | POA: Diagnosis not present

## 2017-06-01 DIAGNOSIS — R0789 Other chest pain: Secondary | ICD-10-CM | POA: Diagnosis not present

## 2017-06-01 DIAGNOSIS — R69 Illness, unspecified: Secondary | ICD-10-CM | POA: Diagnosis not present

## 2017-06-23 ENCOUNTER — Telehealth: Payer: Self-pay | Admitting: Family Medicine

## 2017-06-23 NOTE — Telephone Encounter (Signed)
Copied from [employer] [id number]. Topic: Quick Communication - See Telephone Encounter >> Jun 23, 2017  2:36 PM Ivar Drape wrote: CRM for notification. See Telephone encounter for:  06/23/17. The patient has moved to [place] and she needs her Lyrica medication refilled with 90tab because she can't see the new doctor for a couple of months.  She would like for [facility] [place].  The telephone number is [phone]

## 2017-06-23 NOTE — Telephone Encounter (Signed)
Can we refill until she establishes elsewhere?

## 2017-06-23 NOTE — Telephone Encounter (Signed)
Note which she takes is for.  It will be okay to provide a 90-day supply though further refills need to come from her new physician.

## 2017-06-24 NOTE — Telephone Encounter (Signed)
LMTCB. Need reason for taking medication. OK for [facility] to clarify and let me know.

## 2017-06-28 ENCOUNTER — Telehealth: Payer: Self-pay

## 2017-06-28 DIAGNOSIS — S82851D Displaced trimalleolar fracture of right lower leg, subsequent encounter for closed fracture with routine healing: Secondary | ICD-10-CM | POA: Diagnosis not present

## 2017-06-28 NOTE — Telephone Encounter (Signed)
Can we fill this? Dr. Caryl Bis was wanting to clarify why patient is taking Lyrica before he gave the ok to fill.

## 2017-06-28 NOTE — Telephone Encounter (Signed)
Patient calls requesting refill on Lyrica , she states Dr Charlynn Grimes cardiologist gave her #30 on 05/24/17.  Previously Dr Lacinda Axon wrote script on 11/16/16 for Lyrica 75 mg 1 bid with 3 refills.   However [facility] [place] script is only good for 6 months and it is expired per [employer]   Patient is requesting refill on Lyrica .  Please advise.   Last office 12/23/16 She is planning on establishing care with PCP in [place] , has appointment scheduled in February

## 2017-06-28 NOTE — Telephone Encounter (Signed)
Reviewed Dr Ellen Henri phone note.  States ok for 90 day supply, just need to document why taking.  So, ok for 90 day supply with no refills.  She will need to get other refills from her new MD.

## 2017-06-28 NOTE — Telephone Encounter (Signed)
Patient is taking Lyrica for fibromyaligia and restless leg syndrome. Would like the the [facility] [place] [facility] [address]. Phone #: [phone]

## 2017-06-28 NOTE — Telephone Encounter (Signed)
See other phone note

## 2017-06-28 NOTE — Telephone Encounter (Signed)
error 

## 2017-06-28 NOTE — Telephone Encounter (Signed)
Per review of chart, Dr Lacinda Axon sent in rx for 3 month supply with 3 refills [facility].  This would be encough to last her for one year.  [facility] [place].  Let us know if problems.

## 2017-06-29 ENCOUNTER — Telehealth: Payer: Self-pay | Admitting: Family

## 2017-06-29 MED ORDER — PREGABALIN 75 MG PO CAPS: 75.0000 mg | ORAL_CAPSULE | Freq: Two times a day (BID) | ORAL | 0 refills | Status: AC

## 2017-06-29 NOTE — Telephone Encounter (Signed)
Copied from [place] [id number]. Topic: Inquiry >> Jun 29, 2017  2:51 PM Neva Seat wrote: Pt was a pt of Dr. Jonathon Jordan  Pt is needing a refill:  Lyrica - through Sat. Furosemide 20mg  - 3 or 4 pills

## 2017-06-29 NOTE — Telephone Encounter (Signed)
Called pt that [facility] has been [facility] [place].

## 2017-06-29 NOTE — Telephone Encounter (Signed)
Per patient she is taking for fibromyalgia.

## 2017-06-30 NOTE — Telephone Encounter (Signed)
Ok to send in rx as per previous note (for 90 day supply with no refills).  She had left a pharmacy out of state she wanted rx sent to. Please send and notify pt that future refills will need to be with new PCP.

## 2017-06-30 NOTE — Telephone Encounter (Signed)
Signed and placed on your desk.

## 2017-06-30 NOTE — Telephone Encounter (Signed)
Script placed in your to sign folder

## 2017-06-30 NOTE — Telephone Encounter (Signed)
Faxed script

## 2017-07-01 ENCOUNTER — Ambulatory Visit: Payer: Medicare HMO | Admitting: Family Medicine

## 2017-07-26 DIAGNOSIS — C44329 Squamous cell carcinoma of skin of other parts of face: Secondary | ICD-10-CM | POA: Diagnosis not present

## 2017-07-28 DIAGNOSIS — M47816 Spondylosis without myelopathy or radiculopathy, lumbar region: Secondary | ICD-10-CM | POA: Diagnosis not present

## 2017-07-28 DIAGNOSIS — S82851D Displaced trimalleolar fracture of right lower leg, subsequent encounter for closed fracture with routine healing: Secondary | ICD-10-CM | POA: Diagnosis not present

## 2017-07-28 DIAGNOSIS — M545 Low back pain: Secondary | ICD-10-CM | POA: Diagnosis not present

## 2017-08-19 ENCOUNTER — Other Ambulatory Visit: Payer: Self-pay | Admitting: Family Medicine

## 2017-08-19 NOTE — Telephone Encounter (Signed)
Left message to return call 

## 2017-08-28 DIAGNOSIS — S82851D Displaced trimalleolar fracture of right lower leg, subsequent encounter for closed fracture with routine healing: Secondary | ICD-10-CM | POA: Diagnosis not present

## 2017-09-08 ENCOUNTER — Other Ambulatory Visit: Payer: Self-pay | Admitting: Family Medicine

## 2017-09-16 DIAGNOSIS — R0789 Other chest pain: Secondary | ICD-10-CM | POA: Diagnosis not present

## 2017-09-16 DIAGNOSIS — I10 Essential (primary) hypertension: Secondary | ICD-10-CM | POA: Diagnosis not present

## 2017-09-16 DIAGNOSIS — I2510 Atherosclerotic heart disease of native coronary artery without angina pectoris: Secondary | ICD-10-CM | POA: Diagnosis not present

## 2017-09-28 DIAGNOSIS — S82851D Displaced trimalleolar fracture of right lower leg, subsequent encounter for closed fracture with routine healing: Secondary | ICD-10-CM | POA: Diagnosis not present

## 2017-09-28 DIAGNOSIS — N183 Chronic kidney disease, stage 3 (moderate): Secondary | ICD-10-CM | POA: Diagnosis not present

## 2017-09-28 DIAGNOSIS — I2510 Atherosclerotic heart disease of native coronary artery without angina pectoris: Secondary | ICD-10-CM | POA: Diagnosis not present

## 2017-09-28 DIAGNOSIS — M1990 Unspecified osteoarthritis, unspecified site: Secondary | ICD-10-CM | POA: Diagnosis not present

## 2017-09-28 DIAGNOSIS — I129 Hypertensive chronic kidney disease with stage 1 through stage 4 chronic kidney disease, or unspecified chronic kidney disease: Secondary | ICD-10-CM | POA: Diagnosis not present

## 2017-10-10 DIAGNOSIS — E7800 Pure hypercholesterolemia, unspecified: Secondary | ICD-10-CM | POA: Diagnosis not present

## 2017-10-10 DIAGNOSIS — M25511 Pain in right shoulder: Secondary | ICD-10-CM | POA: Diagnosis not present

## 2017-10-10 DIAGNOSIS — M7501 Adhesive capsulitis of right shoulder: Secondary | ICD-10-CM | POA: Diagnosis not present

## 2017-10-10 DIAGNOSIS — R5383 Other fatigue: Secondary | ICD-10-CM | POA: Diagnosis not present

## 2017-10-10 DIAGNOSIS — I10 Essential (primary) hypertension: Secondary | ICD-10-CM | POA: Diagnosis not present

## 2017-10-16 ENCOUNTER — Other Ambulatory Visit: Payer: Self-pay | Admitting: Internal Medicine

## 2017-10-18 ENCOUNTER — Telehealth: Payer: Self-pay

## 2017-10-18 NOTE — Telephone Encounter (Signed)
Spoke with pt and she stated that she no longer lives in [place].

## 2017-10-24 DIAGNOSIS — I10 Essential (primary) hypertension: Secondary | ICD-10-CM | POA: Diagnosis not present

## 2017-11-02 DIAGNOSIS — N183 Chronic kidney disease, stage 3 (moderate): Secondary | ICD-10-CM | POA: Diagnosis not present

## 2017-11-02 DIAGNOSIS — I129 Hypertensive chronic kidney disease with stage 1 through stage 4 chronic kidney disease, or unspecified chronic kidney disease: Secondary | ICD-10-CM | POA: Diagnosis not present

## 2017-11-02 DIAGNOSIS — I2510 Atherosclerotic heart disease of native coronary artery without angina pectoris: Secondary | ICD-10-CM | POA: Diagnosis not present

## 2017-11-02 DIAGNOSIS — M1990 Unspecified osteoarthritis, unspecified site: Secondary | ICD-10-CM | POA: Diagnosis not present

## 2017-11-16 DIAGNOSIS — Z79899 Other long term (current) drug therapy: Secondary | ICD-10-CM | POA: Diagnosis not present

## 2017-11-27 DIAGNOSIS — J189 Pneumonia, unspecified organism: Secondary | ICD-10-CM | POA: Diagnosis not present

## 2017-11-27 DIAGNOSIS — I255 Ischemic cardiomyopathy: Secondary | ICD-10-CM | POA: Diagnosis not present

## 2017-11-27 DIAGNOSIS — R509 Fever, unspecified: Secondary | ICD-10-CM | POA: Diagnosis not present

## 2017-11-27 DIAGNOSIS — R062 Wheezing: Secondary | ICD-10-CM | POA: Diagnosis not present

## 2017-11-27 DIAGNOSIS — R5383 Other fatigue: Secondary | ICD-10-CM | POA: Diagnosis not present

## 2017-11-27 DIAGNOSIS — N182 Chronic kidney disease, stage 2 (mild): Secondary | ICD-10-CM | POA: Diagnosis not present

## 2017-11-27 DIAGNOSIS — J9811 Atelectasis: Secondary | ICD-10-CM | POA: Diagnosis not present

## 2017-11-27 DIAGNOSIS — J45909 Unspecified asthma, uncomplicated: Secondary | ICD-10-CM | POA: Diagnosis not present

## 2017-11-27 DIAGNOSIS — I2510 Atherosclerotic heart disease of native coronary artery without angina pectoris: Secondary | ICD-10-CM | POA: Diagnosis not present

## 2017-11-27 DIAGNOSIS — R0602 Shortness of breath: Secondary | ICD-10-CM | POA: Diagnosis not present

## 2017-11-27 DIAGNOSIS — M797 Fibromyalgia: Secondary | ICD-10-CM | POA: Diagnosis not present

## 2017-11-27 DIAGNOSIS — I129 Hypertensive chronic kidney disease with stage 1 through stage 4 chronic kidney disease, or unspecified chronic kidney disease: Secondary | ICD-10-CM | POA: Diagnosis not present

## 2017-11-27 DIAGNOSIS — I959 Hypotension, unspecified: Secondary | ICD-10-CM | POA: Diagnosis not present

## 2017-11-27 DIAGNOSIS — R262 Difficulty in walking, not elsewhere classified: Secondary | ICD-10-CM | POA: Diagnosis not present

## 2017-11-27 DIAGNOSIS — K219 Gastro-esophageal reflux disease without esophagitis: Secondary | ICD-10-CM | POA: Diagnosis not present

## 2017-11-28 DIAGNOSIS — R262 Difficulty in walking, not elsewhere classified: Secondary | ICD-10-CM | POA: Diagnosis not present

## 2017-11-28 DIAGNOSIS — M797 Fibromyalgia: Secondary | ICD-10-CM | POA: Diagnosis not present

## 2017-11-28 DIAGNOSIS — K219 Gastro-esophageal reflux disease without esophagitis: Secondary | ICD-10-CM | POA: Diagnosis not present

## 2017-11-28 DIAGNOSIS — R112 Nausea with vomiting, unspecified: Secondary | ICD-10-CM | POA: Diagnosis not present

## 2017-11-28 DIAGNOSIS — I255 Ischemic cardiomyopathy: Secondary | ICD-10-CM | POA: Diagnosis not present

## 2017-11-28 DIAGNOSIS — R509 Fever, unspecified: Secondary | ICD-10-CM | POA: Diagnosis not present

## 2017-11-28 DIAGNOSIS — I2510 Atherosclerotic heart disease of native coronary artery without angina pectoris: Secondary | ICD-10-CM | POA: Diagnosis not present

## 2017-11-28 DIAGNOSIS — R0602 Shortness of breath: Secondary | ICD-10-CM | POA: Diagnosis not present

## 2017-11-28 DIAGNOSIS — I129 Hypertensive chronic kidney disease with stage 1 through stage 4 chronic kidney disease, or unspecified chronic kidney disease: Secondary | ICD-10-CM | POA: Diagnosis not present

## 2017-11-28 DIAGNOSIS — I959 Hypotension, unspecified: Secondary | ICD-10-CM | POA: Diagnosis not present

## 2017-11-28 DIAGNOSIS — J45909 Unspecified asthma, uncomplicated: Secondary | ICD-10-CM | POA: Diagnosis not present

## 2017-11-28 DIAGNOSIS — N182 Chronic kidney disease, stage 2 (mild): Secondary | ICD-10-CM | POA: Diagnosis not present

## 2017-11-28 DIAGNOSIS — R531 Weakness: Secondary | ICD-10-CM | POA: Diagnosis not present

## 2017-11-28 DIAGNOSIS — D649 Anemia, unspecified: Secondary | ICD-10-CM | POA: Diagnosis not present

## 2017-11-28 DIAGNOSIS — R05 Cough: Secondary | ICD-10-CM | POA: Diagnosis not present

## 2017-11-28 DIAGNOSIS — J189 Pneumonia, unspecified organism: Secondary | ICD-10-CM | POA: Diagnosis not present

## 2017-11-28 DIAGNOSIS — J9811 Atelectasis: Secondary | ICD-10-CM | POA: Diagnosis not present

## 2017-11-29 DIAGNOSIS — R531 Weakness: Secondary | ICD-10-CM | POA: Diagnosis not present

## 2017-11-29 DIAGNOSIS — R509 Fever, unspecified: Secondary | ICD-10-CM | POA: Diagnosis not present

## 2017-11-29 DIAGNOSIS — R0602 Shortness of breath: Secondary | ICD-10-CM | POA: Diagnosis not present

## 2017-11-29 DIAGNOSIS — R05 Cough: Secondary | ICD-10-CM | POA: Diagnosis not present

## 2017-11-29 DIAGNOSIS — I959 Hypotension, unspecified: Secondary | ICD-10-CM | POA: Diagnosis not present

## 2017-11-29 DIAGNOSIS — J189 Pneumonia, unspecified organism: Secondary | ICD-10-CM | POA: Diagnosis not present

## 2017-11-29 DIAGNOSIS — R112 Nausea with vomiting, unspecified: Secondary | ICD-10-CM | POA: Diagnosis not present

## 2017-11-30 DIAGNOSIS — J189 Pneumonia, unspecified organism: Secondary | ICD-10-CM | POA: Diagnosis not present

## 2017-11-30 DIAGNOSIS — R05 Cough: Secondary | ICD-10-CM | POA: Diagnosis not present

## 2017-11-30 DIAGNOSIS — I959 Hypotension, unspecified: Secondary | ICD-10-CM | POA: Diagnosis not present

## 2017-11-30 DIAGNOSIS — R112 Nausea with vomiting, unspecified: Secondary | ICD-10-CM | POA: Diagnosis not present

## 2017-11-30 DIAGNOSIS — R509 Fever, unspecified: Secondary | ICD-10-CM | POA: Diagnosis not present

## 2017-11-30 DIAGNOSIS — R531 Weakness: Secondary | ICD-10-CM | POA: Diagnosis not present

## 2017-11-30 DIAGNOSIS — R0602 Shortness of breath: Secondary | ICD-10-CM | POA: Diagnosis not present

## 2017-12-01 DIAGNOSIS — R05 Cough: Secondary | ICD-10-CM | POA: Diagnosis not present

## 2017-12-01 DIAGNOSIS — R531 Weakness: Secondary | ICD-10-CM | POA: Diagnosis not present

## 2017-12-01 DIAGNOSIS — D649 Anemia, unspecified: Secondary | ICD-10-CM | POA: Diagnosis not present

## 2017-12-01 DIAGNOSIS — J189 Pneumonia, unspecified organism: Secondary | ICD-10-CM | POA: Diagnosis not present

## 2017-12-02 DIAGNOSIS — R05 Cough: Secondary | ICD-10-CM | POA: Diagnosis not present

## 2017-12-02 DIAGNOSIS — D649 Anemia, unspecified: Secondary | ICD-10-CM | POA: Diagnosis not present

## 2017-12-02 DIAGNOSIS — J189 Pneumonia, unspecified organism: Secondary | ICD-10-CM | POA: Diagnosis not present

## 2017-12-02 DIAGNOSIS — R531 Weakness: Secondary | ICD-10-CM | POA: Diagnosis not present

## 2017-12-12 DIAGNOSIS — H6691 Otitis media, unspecified, right ear: Secondary | ICD-10-CM | POA: Diagnosis not present

## 2017-12-19 ENCOUNTER — Other Ambulatory Visit: Payer: Self-pay | Admitting: Family Medicine

## 2017-12-27 DIAGNOSIS — M12812 Other specific arthropathies, not elsewhere classified, left shoulder: Secondary | ICD-10-CM | POA: Diagnosis not present

## 2017-12-27 DIAGNOSIS — M12811 Other specific arthropathies, not elsewhere classified, right shoulder: Secondary | ICD-10-CM | POA: Diagnosis not present

## 2017-12-27 DIAGNOSIS — R509 Fever, unspecified: Secondary | ICD-10-CM | POA: Diagnosis not present

## 2017-12-27 DIAGNOSIS — J189 Pneumonia, unspecified organism: Secondary | ICD-10-CM | POA: Diagnosis not present

## 2017-12-27 DIAGNOSIS — M25511 Pain in right shoulder: Secondary | ICD-10-CM | POA: Diagnosis not present

## 2018-01-03 DIAGNOSIS — Z09 Encounter for follow-up examination after completed treatment for conditions other than malignant neoplasm: Secondary | ICD-10-CM | POA: Diagnosis not present

## 2018-01-03 DIAGNOSIS — J189 Pneumonia, unspecified organism: Secondary | ICD-10-CM | POA: Diagnosis not present

## 2018-01-03 DIAGNOSIS — I2510 Atherosclerotic heart disease of native coronary artery without angina pectoris: Secondary | ICD-10-CM | POA: Diagnosis not present

## 2018-03-09 IMAGING — US US EXTREM LOW VENOUS BILAT
1 series · 13 of 24 positions shown · non-contrast
Comparison: None.

CLINICAL DATA: 85-year-old female with a history of lower extremity
cramping



[Series 1: us extrem low venous bilat · 0.08mm/px · 13 of 64 slices shown]
[im 1/64]
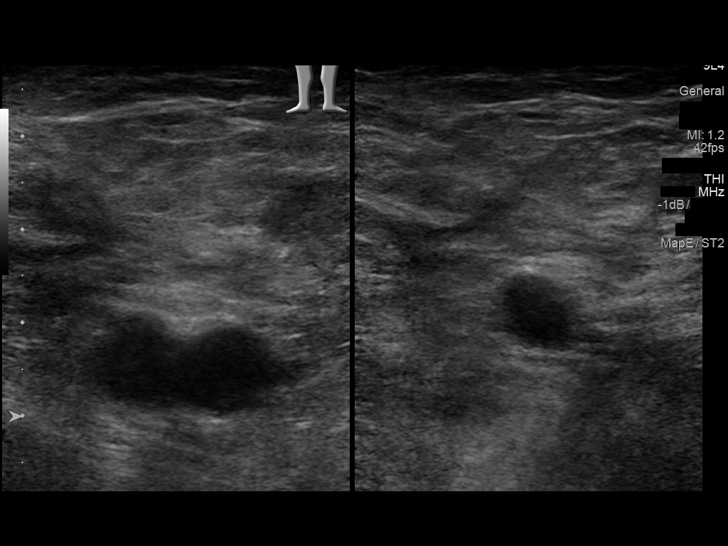
[im 6/64]
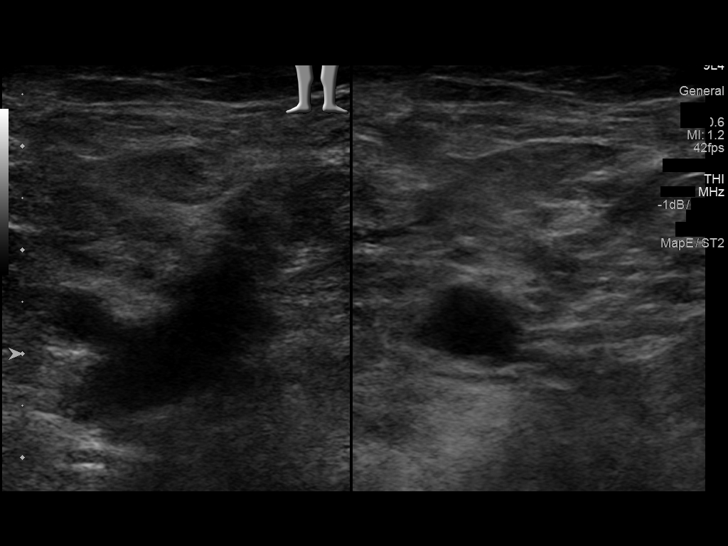
[im 11/64]
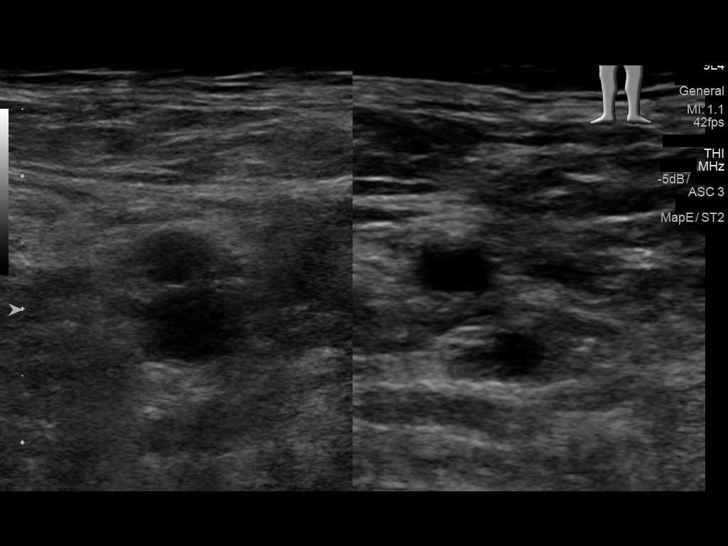
[im 17/64]
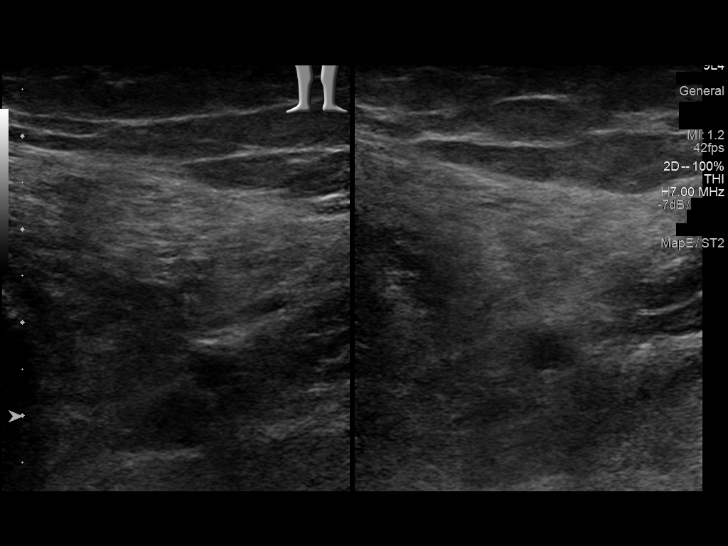
[im 22/64]
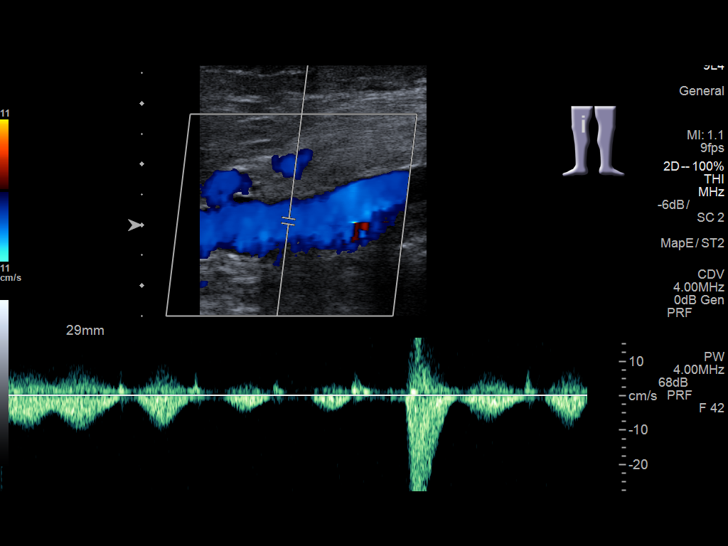
[im 28/64]
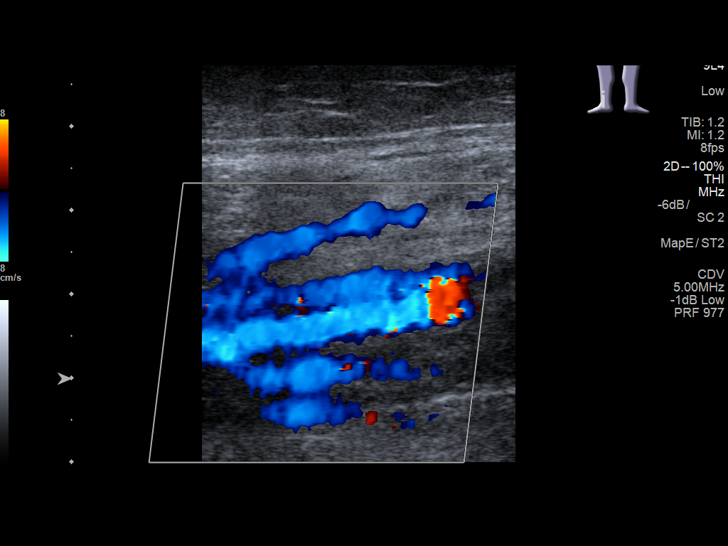
[im 33/64]
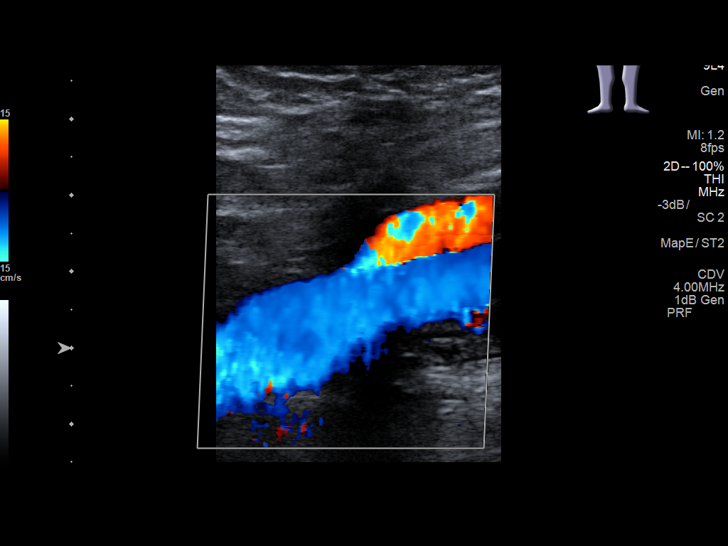
[im 36/64]
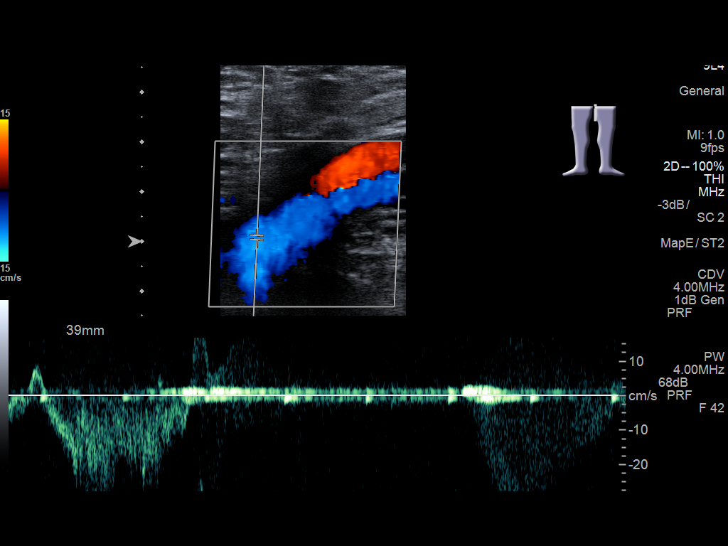
[im 42/64]
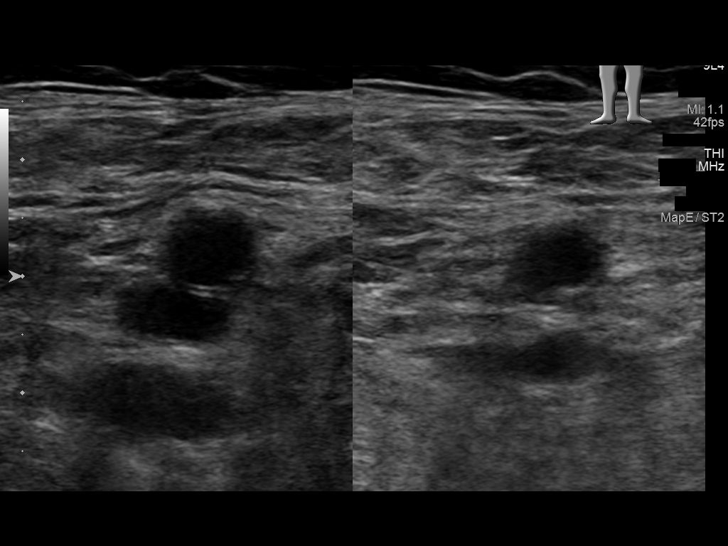
[im 47/64]
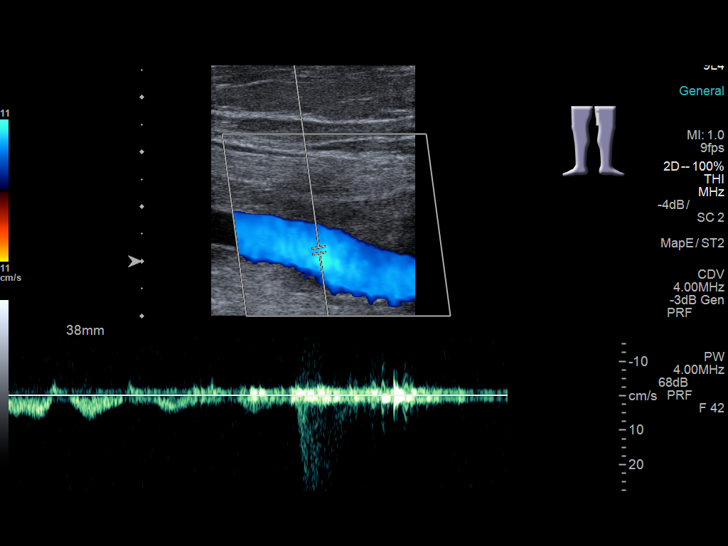
[im 53/64]
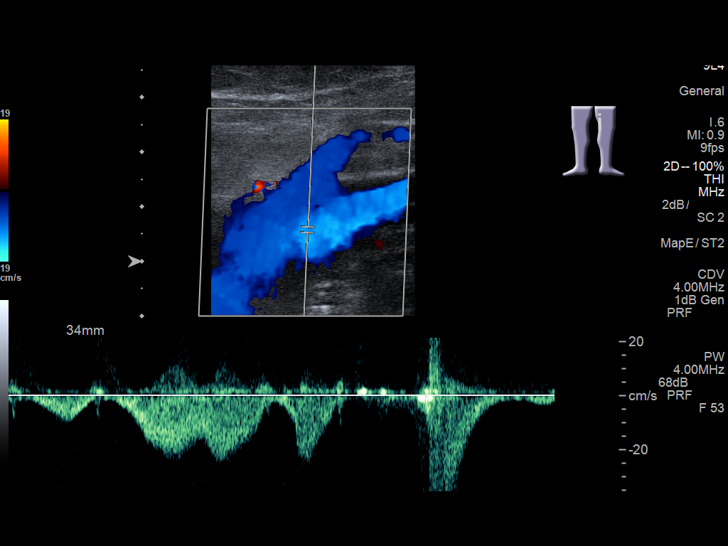
[im 58/64]
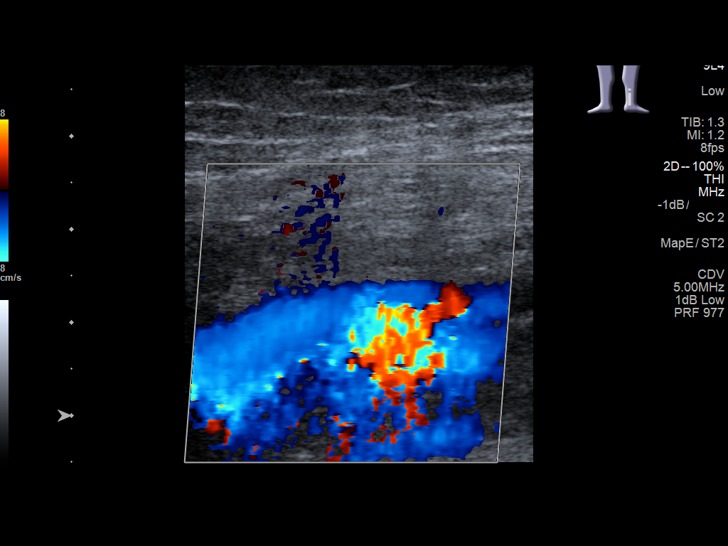
[im 64/64]
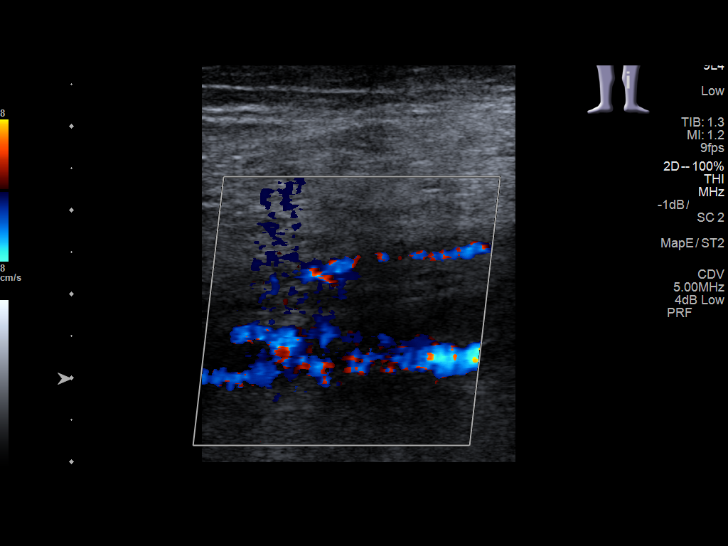

[13 of 24 positions shown; findings below may reference images not displayed]

FINDINGS: RIGHT LOWER EXTREMITY

Common Femoral Vein: No evidence of thrombus. Normal
compressibility, respiratory phasicity and response to augmentation.

Saphenofemoral Junction: No evidence of thrombus. Normal
compressibility and flow on color Doppler imaging.

Profunda Femoral Vein: No evidence of thrombus. Normal
compressibility and flow on color Doppler imaging.

Femoral Vein: No evidence of thrombus. Normal compressibility,
respiratory phasicity and response to augmentation.

Popliteal Vein: No evidence of thrombus. Normal compressibility,
respiratory phasicity and response to augmentation.

Calf Veins: No evidence of thrombus. Normal compressibility and flow
on color Doppler imaging.

Superficial Great Saphenous Vein: No evidence of thrombus. Normal
compressibility and flow on color Doppler imaging.

Other Findings:  None.

LEFT LOWER EXTREMITY

Common Femoral Vein: No evidence of thrombus. Normal
compressibility, respiratory phasicity and response to augmentation.

Saphenofemoral Junction: No evidence of thrombus. Normal
compressibility and flow on color Doppler imaging.

Profunda Femoral Vein: No evidence of thrombus. Normal
compressibility and flow on color Doppler imaging.

Femoral Vein: No evidence of thrombus. Normal compressibility,
respiratory phasicity and response to augmentation.

Popliteal Vein: No evidence of thrombus. Normal compressibility,
respiratory phasicity and response to augmentation.

Calf Veins: No evidence of thrombus. Normal compressibility and flow
on color Doppler imaging.

Superficial Great Saphenous Vein: No evidence of thrombus. Normal
compressibility and flow on color Doppler imaging.

Other Findings:  None.
IMPRESSION: Sonographic survey of the bilateral lower extremities negative for
DVT.

## 2018-03-10 DIAGNOSIS — S43015A Anterior dislocation of left humerus, initial encounter: Secondary | ICD-10-CM | POA: Diagnosis not present

## 2018-03-10 DIAGNOSIS — W010XXA Fall on same level from slipping, tripping and stumbling without subsequent striking against object, initial encounter: Secondary | ICD-10-CM | POA: Diagnosis not present

## 2018-03-10 DIAGNOSIS — M25522 Pain in left elbow: Secondary | ICD-10-CM | POA: Diagnosis not present

## 2018-03-10 DIAGNOSIS — S79911A Unspecified injury of right hip, initial encounter: Secondary | ICD-10-CM | POA: Diagnosis not present

## 2018-03-10 DIAGNOSIS — S79912A Unspecified injury of left hip, initial encounter: Secondary | ICD-10-CM | POA: Diagnosis not present

## 2018-03-10 DIAGNOSIS — S43085A Other dislocation of left shoulder joint, initial encounter: Secondary | ICD-10-CM | POA: Diagnosis not present

## 2018-03-10 DIAGNOSIS — S43005A Unspecified dislocation of left shoulder joint, initial encounter: Secondary | ICD-10-CM | POA: Diagnosis not present

## 2018-03-10 DIAGNOSIS — M25512 Pain in left shoulder: Secondary | ICD-10-CM | POA: Diagnosis not present

## 2018-03-10 DIAGNOSIS — W19XXXA Unspecified fall, initial encounter: Secondary | ICD-10-CM | POA: Diagnosis not present

## 2018-03-10 DIAGNOSIS — Y93E2 Activity, laundry: Secondary | ICD-10-CM | POA: Diagnosis not present

## 2018-03-10 DIAGNOSIS — Y92009 Unspecified place in unspecified non-institutional (private) residence as the place of occurrence of the external cause: Secondary | ICD-10-CM | POA: Diagnosis not present

## 2018-03-10 DIAGNOSIS — Z9181 History of falling: Secondary | ICD-10-CM | POA: Diagnosis not present

## 2018-03-10 DIAGNOSIS — S4981XA Other specified injuries of right shoulder and upper arm, initial encounter: Secondary | ICD-10-CM | POA: Diagnosis not present

## 2018-03-10 DIAGNOSIS — S59902A Unspecified injury of left elbow, initial encounter: Secondary | ICD-10-CM | POA: Diagnosis not present

## 2018-03-10 DIAGNOSIS — Z79899 Other long term (current) drug therapy: Secondary | ICD-10-CM | POA: Diagnosis not present

## 2018-03-13 DIAGNOSIS — M25512 Pain in left shoulder: Secondary | ICD-10-CM | POA: Diagnosis not present

## 2018-03-13 DIAGNOSIS — S4992XA Unspecified injury of left shoulder and upper arm, initial encounter: Secondary | ICD-10-CM | POA: Diagnosis not present

## 2018-04-03 DIAGNOSIS — M19012 Primary osteoarthritis, left shoulder: Secondary | ICD-10-CM | POA: Diagnosis not present

## 2018-04-04 DIAGNOSIS — S43084A Other dislocation of right shoulder joint, initial encounter: Secondary | ICD-10-CM | POA: Diagnosis not present

## 2018-04-04 DIAGNOSIS — I10 Essential (primary) hypertension: Secondary | ICD-10-CM | POA: Diagnosis not present

## 2018-04-04 DIAGNOSIS — I2510 Atherosclerotic heart disease of native coronary artery without angina pectoris: Secondary | ICD-10-CM | POA: Diagnosis not present

## 2018-04-13 DIAGNOSIS — I252 Old myocardial infarction: Secondary | ICD-10-CM | POA: Diagnosis not present

## 2018-04-13 DIAGNOSIS — W19XXXD Unspecified fall, subsequent encounter: Secondary | ICD-10-CM | POA: Diagnosis not present

## 2018-04-13 DIAGNOSIS — S43005D Unspecified dislocation of left shoulder joint, subsequent encounter: Secondary | ICD-10-CM | POA: Diagnosis not present

## 2018-04-13 DIAGNOSIS — M797 Fibromyalgia: Secondary | ICD-10-CM | POA: Diagnosis not present

## 2018-04-13 DIAGNOSIS — I509 Heart failure, unspecified: Secondary | ICD-10-CM | POA: Diagnosis not present

## 2018-04-13 DIAGNOSIS — M1990 Unspecified osteoarthritis, unspecified site: Secondary | ICD-10-CM | POA: Diagnosis not present

## 2018-04-13 DIAGNOSIS — I110 Hypertensive heart disease with heart failure: Secondary | ICD-10-CM | POA: Diagnosis not present

## 2018-04-13 DIAGNOSIS — H9190 Unspecified hearing loss, unspecified ear: Secondary | ICD-10-CM | POA: Diagnosis not present

## 2018-04-13 DIAGNOSIS — I2510 Atherosclerotic heart disease of native coronary artery without angina pectoris: Secondary | ICD-10-CM | POA: Diagnosis not present

## 2018-04-13 DIAGNOSIS — K219 Gastro-esophageal reflux disease without esophagitis: Secondary | ICD-10-CM | POA: Diagnosis not present

## 2018-04-17 DIAGNOSIS — M1990 Unspecified osteoarthritis, unspecified site: Secondary | ICD-10-CM | POA: Diagnosis not present

## 2018-04-17 DIAGNOSIS — S43005D Unspecified dislocation of left shoulder joint, subsequent encounter: Secondary | ICD-10-CM | POA: Diagnosis not present

## 2018-04-17 DIAGNOSIS — M797 Fibromyalgia: Secondary | ICD-10-CM | POA: Diagnosis not present

## 2018-04-17 DIAGNOSIS — I2510 Atherosclerotic heart disease of native coronary artery without angina pectoris: Secondary | ICD-10-CM | POA: Diagnosis not present

## 2018-04-17 DIAGNOSIS — I509 Heart failure, unspecified: Secondary | ICD-10-CM | POA: Diagnosis not present

## 2018-04-17 DIAGNOSIS — W19XXXD Unspecified fall, subsequent encounter: Secondary | ICD-10-CM | POA: Diagnosis not present

## 2018-04-17 DIAGNOSIS — I110 Hypertensive heart disease with heart failure: Secondary | ICD-10-CM | POA: Diagnosis not present

## 2018-04-17 DIAGNOSIS — H9190 Unspecified hearing loss, unspecified ear: Secondary | ICD-10-CM | POA: Diagnosis not present

## 2018-04-17 DIAGNOSIS — E860 Dehydration: Secondary | ICD-10-CM | POA: Diagnosis not present

## 2018-04-17 DIAGNOSIS — R1030 Lower abdominal pain, unspecified: Secondary | ICD-10-CM | POA: Diagnosis not present

## 2018-04-17 DIAGNOSIS — N390 Urinary tract infection, site not specified: Secondary | ICD-10-CM | POA: Diagnosis not present

## 2018-04-17 DIAGNOSIS — I252 Old myocardial infarction: Secondary | ICD-10-CM | POA: Diagnosis not present

## 2018-04-17 DIAGNOSIS — K219 Gastro-esophageal reflux disease without esophagitis: Secondary | ICD-10-CM | POA: Diagnosis not present

## 2018-04-18 DIAGNOSIS — I110 Hypertensive heart disease with heart failure: Secondary | ICD-10-CM | POA: Diagnosis not present

## 2018-04-18 DIAGNOSIS — I2510 Atherosclerotic heart disease of native coronary artery without angina pectoris: Secondary | ICD-10-CM | POA: Diagnosis not present

## 2018-04-18 DIAGNOSIS — M1990 Unspecified osteoarthritis, unspecified site: Secondary | ICD-10-CM | POA: Diagnosis not present

## 2018-04-18 DIAGNOSIS — I252 Old myocardial infarction: Secondary | ICD-10-CM | POA: Diagnosis not present

## 2018-04-18 DIAGNOSIS — K219 Gastro-esophageal reflux disease without esophagitis: Secondary | ICD-10-CM | POA: Diagnosis not present

## 2018-04-18 DIAGNOSIS — S43005D Unspecified dislocation of left shoulder joint, subsequent encounter: Secondary | ICD-10-CM | POA: Diagnosis not present

## 2018-04-18 DIAGNOSIS — H9190 Unspecified hearing loss, unspecified ear: Secondary | ICD-10-CM | POA: Diagnosis not present

## 2018-04-18 DIAGNOSIS — I509 Heart failure, unspecified: Secondary | ICD-10-CM | POA: Diagnosis not present

## 2018-04-18 DIAGNOSIS — W19XXXD Unspecified fall, subsequent encounter: Secondary | ICD-10-CM | POA: Diagnosis not present

## 2018-04-18 DIAGNOSIS — M797 Fibromyalgia: Secondary | ICD-10-CM | POA: Diagnosis not present

## 2018-04-19 DIAGNOSIS — W19XXXD Unspecified fall, subsequent encounter: Secondary | ICD-10-CM | POA: Diagnosis not present

## 2018-04-19 DIAGNOSIS — I252 Old myocardial infarction: Secondary | ICD-10-CM | POA: Diagnosis not present

## 2018-04-19 DIAGNOSIS — I2510 Atherosclerotic heart disease of native coronary artery without angina pectoris: Secondary | ICD-10-CM | POA: Diagnosis not present

## 2018-04-19 DIAGNOSIS — H9190 Unspecified hearing loss, unspecified ear: Secondary | ICD-10-CM | POA: Diagnosis not present

## 2018-04-19 DIAGNOSIS — I110 Hypertensive heart disease with heart failure: Secondary | ICD-10-CM | POA: Diagnosis not present

## 2018-04-19 DIAGNOSIS — I509 Heart failure, unspecified: Secondary | ICD-10-CM | POA: Diagnosis not present

## 2018-04-19 DIAGNOSIS — M797 Fibromyalgia: Secondary | ICD-10-CM | POA: Diagnosis not present

## 2018-04-19 DIAGNOSIS — K219 Gastro-esophageal reflux disease without esophagitis: Secondary | ICD-10-CM | POA: Diagnosis not present

## 2018-04-19 DIAGNOSIS — S43005D Unspecified dislocation of left shoulder joint, subsequent encounter: Secondary | ICD-10-CM | POA: Diagnosis not present

## 2018-04-19 DIAGNOSIS — M1990 Unspecified osteoarthritis, unspecified site: Secondary | ICD-10-CM | POA: Diagnosis not present

## 2018-04-20 DIAGNOSIS — M797 Fibromyalgia: Secondary | ICD-10-CM | POA: Diagnosis not present

## 2018-04-20 DIAGNOSIS — K219 Gastro-esophageal reflux disease without esophagitis: Secondary | ICD-10-CM | POA: Diagnosis not present

## 2018-04-20 DIAGNOSIS — I110 Hypertensive heart disease with heart failure: Secondary | ICD-10-CM | POA: Diagnosis not present

## 2018-04-20 DIAGNOSIS — I509 Heart failure, unspecified: Secondary | ICD-10-CM | POA: Diagnosis not present

## 2018-04-20 DIAGNOSIS — I252 Old myocardial infarction: Secondary | ICD-10-CM | POA: Diagnosis not present

## 2018-04-20 DIAGNOSIS — S43005D Unspecified dislocation of left shoulder joint, subsequent encounter: Secondary | ICD-10-CM | POA: Diagnosis not present

## 2018-04-20 DIAGNOSIS — M1990 Unspecified osteoarthritis, unspecified site: Secondary | ICD-10-CM | POA: Diagnosis not present

## 2018-04-20 DIAGNOSIS — W19XXXD Unspecified fall, subsequent encounter: Secondary | ICD-10-CM | POA: Diagnosis not present

## 2018-04-20 DIAGNOSIS — H9190 Unspecified hearing loss, unspecified ear: Secondary | ICD-10-CM | POA: Diagnosis not present

## 2018-04-20 DIAGNOSIS — I2510 Atherosclerotic heart disease of native coronary artery without angina pectoris: Secondary | ICD-10-CM | POA: Diagnosis not present

## 2018-04-21 DIAGNOSIS — M797 Fibromyalgia: Secondary | ICD-10-CM | POA: Diagnosis not present

## 2018-04-21 DIAGNOSIS — I252 Old myocardial infarction: Secondary | ICD-10-CM | POA: Diagnosis not present

## 2018-04-21 DIAGNOSIS — I509 Heart failure, unspecified: Secondary | ICD-10-CM | POA: Diagnosis not present

## 2018-04-21 DIAGNOSIS — S43005D Unspecified dislocation of left shoulder joint, subsequent encounter: Secondary | ICD-10-CM | POA: Diagnosis not present

## 2018-04-21 DIAGNOSIS — K219 Gastro-esophageal reflux disease without esophagitis: Secondary | ICD-10-CM | POA: Diagnosis not present

## 2018-04-21 DIAGNOSIS — I2510 Atherosclerotic heart disease of native coronary artery without angina pectoris: Secondary | ICD-10-CM | POA: Diagnosis not present

## 2018-04-21 DIAGNOSIS — I110 Hypertensive heart disease with heart failure: Secondary | ICD-10-CM | POA: Diagnosis not present

## 2018-04-21 DIAGNOSIS — H9190 Unspecified hearing loss, unspecified ear: Secondary | ICD-10-CM | POA: Diagnosis not present

## 2018-04-21 DIAGNOSIS — W19XXXD Unspecified fall, subsequent encounter: Secondary | ICD-10-CM | POA: Diagnosis not present

## 2018-04-21 DIAGNOSIS — M1990 Unspecified osteoarthritis, unspecified site: Secondary | ICD-10-CM | POA: Diagnosis not present

## 2018-04-24 DIAGNOSIS — I509 Heart failure, unspecified: Secondary | ICD-10-CM | POA: Diagnosis not present

## 2018-04-24 DIAGNOSIS — M797 Fibromyalgia: Secondary | ICD-10-CM | POA: Diagnosis not present

## 2018-04-24 DIAGNOSIS — M1990 Unspecified osteoarthritis, unspecified site: Secondary | ICD-10-CM | POA: Diagnosis not present

## 2018-04-24 DIAGNOSIS — K219 Gastro-esophageal reflux disease without esophagitis: Secondary | ICD-10-CM | POA: Diagnosis not present

## 2018-04-24 DIAGNOSIS — I252 Old myocardial infarction: Secondary | ICD-10-CM | POA: Diagnosis not present

## 2018-04-24 DIAGNOSIS — H9190 Unspecified hearing loss, unspecified ear: Secondary | ICD-10-CM | POA: Diagnosis not present

## 2018-04-24 DIAGNOSIS — I110 Hypertensive heart disease with heart failure: Secondary | ICD-10-CM | POA: Diagnosis not present

## 2018-04-24 DIAGNOSIS — S43005D Unspecified dislocation of left shoulder joint, subsequent encounter: Secondary | ICD-10-CM | POA: Diagnosis not present

## 2018-04-24 DIAGNOSIS — W19XXXD Unspecified fall, subsequent encounter: Secondary | ICD-10-CM | POA: Diagnosis not present

## 2018-04-24 DIAGNOSIS — I2510 Atherosclerotic heart disease of native coronary artery without angina pectoris: Secondary | ICD-10-CM | POA: Diagnosis not present

## 2018-04-25 DIAGNOSIS — I2510 Atherosclerotic heart disease of native coronary artery without angina pectoris: Secondary | ICD-10-CM | POA: Diagnosis not present

## 2018-04-25 DIAGNOSIS — K219 Gastro-esophageal reflux disease without esophagitis: Secondary | ICD-10-CM | POA: Diagnosis not present

## 2018-04-25 DIAGNOSIS — M797 Fibromyalgia: Secondary | ICD-10-CM | POA: Diagnosis not present

## 2018-04-25 DIAGNOSIS — I110 Hypertensive heart disease with heart failure: Secondary | ICD-10-CM | POA: Diagnosis not present

## 2018-04-25 DIAGNOSIS — W19XXXD Unspecified fall, subsequent encounter: Secondary | ICD-10-CM | POA: Diagnosis not present

## 2018-04-25 DIAGNOSIS — H9190 Unspecified hearing loss, unspecified ear: Secondary | ICD-10-CM | POA: Diagnosis not present

## 2018-04-25 DIAGNOSIS — I252 Old myocardial infarction: Secondary | ICD-10-CM | POA: Diagnosis not present

## 2018-04-25 DIAGNOSIS — M1990 Unspecified osteoarthritis, unspecified site: Secondary | ICD-10-CM | POA: Diagnosis not present

## 2018-04-25 DIAGNOSIS — S43005D Unspecified dislocation of left shoulder joint, subsequent encounter: Secondary | ICD-10-CM | POA: Diagnosis not present

## 2018-04-25 DIAGNOSIS — I509 Heart failure, unspecified: Secondary | ICD-10-CM | POA: Diagnosis not present

## 2018-04-26 DIAGNOSIS — H9190 Unspecified hearing loss, unspecified ear: Secondary | ICD-10-CM | POA: Diagnosis not present

## 2018-04-26 DIAGNOSIS — I2510 Atherosclerotic heart disease of native coronary artery without angina pectoris: Secondary | ICD-10-CM | POA: Diagnosis not present

## 2018-04-26 DIAGNOSIS — I110 Hypertensive heart disease with heart failure: Secondary | ICD-10-CM | POA: Diagnosis not present

## 2018-04-26 DIAGNOSIS — W19XXXD Unspecified fall, subsequent encounter: Secondary | ICD-10-CM | POA: Diagnosis not present

## 2018-04-26 DIAGNOSIS — K219 Gastro-esophageal reflux disease without esophagitis: Secondary | ICD-10-CM | POA: Diagnosis not present

## 2018-04-26 DIAGNOSIS — S43005D Unspecified dislocation of left shoulder joint, subsequent encounter: Secondary | ICD-10-CM | POA: Diagnosis not present

## 2018-04-26 DIAGNOSIS — I252 Old myocardial infarction: Secondary | ICD-10-CM | POA: Diagnosis not present

## 2018-04-26 DIAGNOSIS — I509 Heart failure, unspecified: Secondary | ICD-10-CM | POA: Diagnosis not present

## 2018-04-26 DIAGNOSIS — M797 Fibromyalgia: Secondary | ICD-10-CM | POA: Diagnosis not present

## 2018-04-26 DIAGNOSIS — M1990 Unspecified osteoarthritis, unspecified site: Secondary | ICD-10-CM | POA: Diagnosis not present

## 2018-04-27 DIAGNOSIS — M797 Fibromyalgia: Secondary | ICD-10-CM | POA: Diagnosis not present

## 2018-04-27 DIAGNOSIS — S43005D Unspecified dislocation of left shoulder joint, subsequent encounter: Secondary | ICD-10-CM | POA: Diagnosis not present

## 2018-04-27 DIAGNOSIS — W19XXXD Unspecified fall, subsequent encounter: Secondary | ICD-10-CM | POA: Diagnosis not present

## 2018-04-27 DIAGNOSIS — H9190 Unspecified hearing loss, unspecified ear: Secondary | ICD-10-CM | POA: Diagnosis not present

## 2018-04-27 DIAGNOSIS — I509 Heart failure, unspecified: Secondary | ICD-10-CM | POA: Diagnosis not present

## 2018-04-27 DIAGNOSIS — K219 Gastro-esophageal reflux disease without esophagitis: Secondary | ICD-10-CM | POA: Diagnosis not present

## 2018-04-27 DIAGNOSIS — I110 Hypertensive heart disease with heart failure: Secondary | ICD-10-CM | POA: Diagnosis not present

## 2018-04-27 DIAGNOSIS — I252 Old myocardial infarction: Secondary | ICD-10-CM | POA: Diagnosis not present

## 2018-04-27 DIAGNOSIS — M1990 Unspecified osteoarthritis, unspecified site: Secondary | ICD-10-CM | POA: Diagnosis not present

## 2018-04-27 DIAGNOSIS — I2510 Atherosclerotic heart disease of native coronary artery without angina pectoris: Secondary | ICD-10-CM | POA: Diagnosis not present

## 2018-04-28 DIAGNOSIS — M1990 Unspecified osteoarthritis, unspecified site: Secondary | ICD-10-CM | POA: Diagnosis not present

## 2018-04-28 DIAGNOSIS — I509 Heart failure, unspecified: Secondary | ICD-10-CM | POA: Diagnosis not present

## 2018-04-28 DIAGNOSIS — I110 Hypertensive heart disease with heart failure: Secondary | ICD-10-CM | POA: Diagnosis not present

## 2018-04-28 DIAGNOSIS — M797 Fibromyalgia: Secondary | ICD-10-CM | POA: Diagnosis not present

## 2018-04-28 DIAGNOSIS — K219 Gastro-esophageal reflux disease without esophagitis: Secondary | ICD-10-CM | POA: Diagnosis not present

## 2018-04-28 DIAGNOSIS — I252 Old myocardial infarction: Secondary | ICD-10-CM | POA: Diagnosis not present

## 2018-04-28 DIAGNOSIS — S43005D Unspecified dislocation of left shoulder joint, subsequent encounter: Secondary | ICD-10-CM | POA: Diagnosis not present

## 2018-04-28 DIAGNOSIS — W19XXXD Unspecified fall, subsequent encounter: Secondary | ICD-10-CM | POA: Diagnosis not present

## 2018-04-28 DIAGNOSIS — I2510 Atherosclerotic heart disease of native coronary artery without angina pectoris: Secondary | ICD-10-CM | POA: Diagnosis not present

## 2018-04-28 DIAGNOSIS — H9190 Unspecified hearing loss, unspecified ear: Secondary | ICD-10-CM | POA: Diagnosis not present

## 2018-05-01 DIAGNOSIS — W19XXXD Unspecified fall, subsequent encounter: Secondary | ICD-10-CM | POA: Diagnosis not present

## 2018-05-01 DIAGNOSIS — I2510 Atherosclerotic heart disease of native coronary artery without angina pectoris: Secondary | ICD-10-CM | POA: Diagnosis not present

## 2018-05-01 DIAGNOSIS — M1990 Unspecified osteoarthritis, unspecified site: Secondary | ICD-10-CM | POA: Diagnosis not present

## 2018-05-01 DIAGNOSIS — S43005D Unspecified dislocation of left shoulder joint, subsequent encounter: Secondary | ICD-10-CM | POA: Diagnosis not present

## 2018-05-01 DIAGNOSIS — I252 Old myocardial infarction: Secondary | ICD-10-CM | POA: Diagnosis not present

## 2018-05-01 DIAGNOSIS — K219 Gastro-esophageal reflux disease without esophagitis: Secondary | ICD-10-CM | POA: Diagnosis not present

## 2018-05-01 DIAGNOSIS — I509 Heart failure, unspecified: Secondary | ICD-10-CM | POA: Diagnosis not present

## 2018-05-01 DIAGNOSIS — I110 Hypertensive heart disease with heart failure: Secondary | ICD-10-CM | POA: Diagnosis not present

## 2018-05-01 DIAGNOSIS — M797 Fibromyalgia: Secondary | ICD-10-CM | POA: Diagnosis not present

## 2018-05-01 DIAGNOSIS — H9190 Unspecified hearing loss, unspecified ear: Secondary | ICD-10-CM | POA: Diagnosis not present

## 2018-05-02 DIAGNOSIS — I2510 Atherosclerotic heart disease of native coronary artery without angina pectoris: Secondary | ICD-10-CM | POA: Diagnosis not present

## 2018-05-02 DIAGNOSIS — M797 Fibromyalgia: Secondary | ICD-10-CM | POA: Diagnosis not present

## 2018-05-02 DIAGNOSIS — M1990 Unspecified osteoarthritis, unspecified site: Secondary | ICD-10-CM | POA: Diagnosis not present

## 2018-05-02 DIAGNOSIS — H9190 Unspecified hearing loss, unspecified ear: Secondary | ICD-10-CM | POA: Diagnosis not present

## 2018-05-02 DIAGNOSIS — I110 Hypertensive heart disease with heart failure: Secondary | ICD-10-CM | POA: Diagnosis not present

## 2018-05-02 DIAGNOSIS — W19XXXD Unspecified fall, subsequent encounter: Secondary | ICD-10-CM | POA: Diagnosis not present

## 2018-05-02 DIAGNOSIS — I252 Old myocardial infarction: Secondary | ICD-10-CM | POA: Diagnosis not present

## 2018-05-02 DIAGNOSIS — K219 Gastro-esophageal reflux disease without esophagitis: Secondary | ICD-10-CM | POA: Diagnosis not present

## 2018-05-02 DIAGNOSIS — I509 Heart failure, unspecified: Secondary | ICD-10-CM | POA: Diagnosis not present

## 2018-05-02 DIAGNOSIS — S43005D Unspecified dislocation of left shoulder joint, subsequent encounter: Secondary | ICD-10-CM | POA: Diagnosis not present

## 2018-05-03 DIAGNOSIS — S43005D Unspecified dislocation of left shoulder joint, subsequent encounter: Secondary | ICD-10-CM | POA: Diagnosis not present

## 2018-05-03 DIAGNOSIS — I2510 Atherosclerotic heart disease of native coronary artery without angina pectoris: Secondary | ICD-10-CM | POA: Diagnosis not present

## 2018-05-03 DIAGNOSIS — I252 Old myocardial infarction: Secondary | ICD-10-CM | POA: Diagnosis not present

## 2018-05-03 DIAGNOSIS — W19XXXD Unspecified fall, subsequent encounter: Secondary | ICD-10-CM | POA: Diagnosis not present

## 2018-05-03 DIAGNOSIS — I509 Heart failure, unspecified: Secondary | ICD-10-CM | POA: Diagnosis not present

## 2018-05-03 DIAGNOSIS — M1990 Unspecified osteoarthritis, unspecified site: Secondary | ICD-10-CM | POA: Diagnosis not present

## 2018-05-03 DIAGNOSIS — M797 Fibromyalgia: Secondary | ICD-10-CM | POA: Diagnosis not present

## 2018-05-03 DIAGNOSIS — I110 Hypertensive heart disease with heart failure: Secondary | ICD-10-CM | POA: Diagnosis not present

## 2018-05-03 DIAGNOSIS — K219 Gastro-esophageal reflux disease without esophagitis: Secondary | ICD-10-CM | POA: Diagnosis not present

## 2018-05-03 DIAGNOSIS — H9190 Unspecified hearing loss, unspecified ear: Secondary | ICD-10-CM | POA: Diagnosis not present

## 2018-05-04 DIAGNOSIS — I110 Hypertensive heart disease with heart failure: Secondary | ICD-10-CM | POA: Diagnosis not present

## 2018-05-04 DIAGNOSIS — I252 Old myocardial infarction: Secondary | ICD-10-CM | POA: Diagnosis not present

## 2018-05-04 DIAGNOSIS — I509 Heart failure, unspecified: Secondary | ICD-10-CM | POA: Diagnosis not present

## 2018-05-04 DIAGNOSIS — S43005D Unspecified dislocation of left shoulder joint, subsequent encounter: Secondary | ICD-10-CM | POA: Diagnosis not present

## 2018-05-04 DIAGNOSIS — M1990 Unspecified osteoarthritis, unspecified site: Secondary | ICD-10-CM | POA: Diagnosis not present

## 2018-05-04 DIAGNOSIS — I2510 Atherosclerotic heart disease of native coronary artery without angina pectoris: Secondary | ICD-10-CM | POA: Diagnosis not present

## 2018-05-04 DIAGNOSIS — W19XXXD Unspecified fall, subsequent encounter: Secondary | ICD-10-CM | POA: Diagnosis not present

## 2018-05-04 DIAGNOSIS — M797 Fibromyalgia: Secondary | ICD-10-CM | POA: Diagnosis not present

## 2018-05-04 DIAGNOSIS — H9190 Unspecified hearing loss, unspecified ear: Secondary | ICD-10-CM | POA: Diagnosis not present

## 2018-05-04 DIAGNOSIS — K219 Gastro-esophageal reflux disease without esophagitis: Secondary | ICD-10-CM | POA: Diagnosis not present

## 2018-05-08 DIAGNOSIS — M797 Fibromyalgia: Secondary | ICD-10-CM | POA: Diagnosis not present

## 2018-05-08 DIAGNOSIS — I2510 Atherosclerotic heart disease of native coronary artery without angina pectoris: Secondary | ICD-10-CM | POA: Diagnosis not present

## 2018-05-08 DIAGNOSIS — W19XXXD Unspecified fall, subsequent encounter: Secondary | ICD-10-CM | POA: Diagnosis not present

## 2018-05-08 DIAGNOSIS — K219 Gastro-esophageal reflux disease without esophagitis: Secondary | ICD-10-CM | POA: Diagnosis not present

## 2018-05-08 DIAGNOSIS — I252 Old myocardial infarction: Secondary | ICD-10-CM | POA: Diagnosis not present

## 2018-05-08 DIAGNOSIS — I509 Heart failure, unspecified: Secondary | ICD-10-CM | POA: Diagnosis not present

## 2018-05-08 DIAGNOSIS — H9190 Unspecified hearing loss, unspecified ear: Secondary | ICD-10-CM | POA: Diagnosis not present

## 2018-05-08 DIAGNOSIS — M1990 Unspecified osteoarthritis, unspecified site: Secondary | ICD-10-CM | POA: Diagnosis not present

## 2018-05-08 DIAGNOSIS — S43005D Unspecified dislocation of left shoulder joint, subsequent encounter: Secondary | ICD-10-CM | POA: Diagnosis not present

## 2018-05-08 DIAGNOSIS — I110 Hypertensive heart disease with heart failure: Secondary | ICD-10-CM | POA: Diagnosis not present

## 2018-05-31 DIAGNOSIS — M12812 Other specific arthropathies, not elsewhere classified, left shoulder: Secondary | ICD-10-CM | POA: Diagnosis not present

## 2018-05-31 DIAGNOSIS — M12811 Other specific arthropathies, not elsewhere classified, right shoulder: Secondary | ICD-10-CM | POA: Diagnosis not present

## 2018-06-02 DIAGNOSIS — I2510 Atherosclerotic heart disease of native coronary artery without angina pectoris: Secondary | ICD-10-CM | POA: Diagnosis not present

## 2018-06-02 DIAGNOSIS — I10 Essential (primary) hypertension: Secondary | ICD-10-CM | POA: Diagnosis not present

## 2018-06-12 DIAGNOSIS — H26491 Other secondary cataract, right eye: Secondary | ICD-10-CM | POA: Diagnosis not present

## 2018-07-07 DIAGNOSIS — H26491 Other secondary cataract, right eye: Secondary | ICD-10-CM | POA: Diagnosis not present

## 2018-07-07 DIAGNOSIS — R69 Illness, unspecified: Secondary | ICD-10-CM | POA: Diagnosis not present

## 2018-12-12 ENCOUNTER — Telehealth: Payer: Self-pay | Admitting: Cardiovascular Disease

## 2018-12-12 NOTE — Telephone Encounter (Signed)
Patient moved out of state. Per request deleting recall

## 2023-06-17 DEATH — deceased
